# Patient Record
Sex: Female | Born: 1955 | Race: White | Hispanic: No | Marital: Married | Smoking: Never smoker
Health system: Southern US, Community
[De-identification: ages and names within clinical notes are randomized; demographics above are authoritative.]

## PROBLEM LIST (undated history)

## (undated) DIAGNOSIS — U071 COVID-19: Secondary | ICD-10-CM

## (undated) DIAGNOSIS — F32A Depression, unspecified: Secondary | ICD-10-CM

## (undated) DIAGNOSIS — I10 Essential (primary) hypertension: Secondary | ICD-10-CM

## (undated) DIAGNOSIS — I208 Other forms of angina pectoris: Secondary | ICD-10-CM

## (undated) DIAGNOSIS — G4730 Sleep apnea, unspecified: Secondary | ICD-10-CM

## (undated) DIAGNOSIS — M545 Low back pain, unspecified: Secondary | ICD-10-CM

## (undated) DIAGNOSIS — T7840XA Allergy, unspecified, initial encounter: Secondary | ICD-10-CM

## (undated) DIAGNOSIS — Z9989 Dependence on other enabling machines and devices: Secondary | ICD-10-CM

## (undated) DIAGNOSIS — I2510 Atherosclerotic heart disease of native coronary artery without angina pectoris: Secondary | ICD-10-CM

## (undated) DIAGNOSIS — R112 Nausea with vomiting, unspecified: Secondary | ICD-10-CM

## (undated) DIAGNOSIS — I2089 Other forms of angina pectoris: Secondary | ICD-10-CM

## (undated) DIAGNOSIS — G4733 Obstructive sleep apnea (adult) (pediatric): Secondary | ICD-10-CM

## (undated) DIAGNOSIS — F329 Major depressive disorder, single episode, unspecified: Secondary | ICD-10-CM

## (undated) DIAGNOSIS — E119 Type 2 diabetes mellitus without complications: Secondary | ICD-10-CM

## (undated) DIAGNOSIS — E785 Hyperlipidemia, unspecified: Secondary | ICD-10-CM

## (undated) DIAGNOSIS — B019 Varicella without complication: Secondary | ICD-10-CM

## (undated) DIAGNOSIS — Z9889 Other specified postprocedural states: Secondary | ICD-10-CM

## (undated) DIAGNOSIS — Z9289 Personal history of other medical treatment: Secondary | ICD-10-CM

## (undated) DIAGNOSIS — H269 Unspecified cataract: Secondary | ICD-10-CM

## (undated) HISTORY — DX: COVID-19: U07.1

## (undated) HISTORY — DX: Varicella without complication: B01.9

## (undated) HISTORY — DX: Dependence on other enabling machines and devices: Z99.89

## (undated) HISTORY — DX: Type 2 diabetes mellitus without complications: E11.9

## (undated) HISTORY — DX: Personal history of other medical treatment: Z92.89

## (undated) HISTORY — DX: Hyperlipidemia, unspecified: E78.5

## (undated) HISTORY — DX: Depression, unspecified: F32.A

## (undated) HISTORY — DX: Sleep apnea, unspecified: G47.30

## (undated) HISTORY — DX: Allergy, unspecified, initial encounter: T78.40XA

## (undated) HISTORY — DX: Obstructive sleep apnea (adult) (pediatric): G47.33

## (undated) HISTORY — DX: Unspecified cataract: H26.9

## (undated) HISTORY — PX: CAROTID STENT: SHX1301

## (undated) HISTORY — PX: BREAST EXCISIONAL BIOPSY: SUR124

## (undated) HISTORY — PX: NOSE SURGERY: SHX723

## (undated) HISTORY — DX: Low back pain, unspecified: M54.50

## (undated) HISTORY — PX: FOOT SURGERY: SHX648

## (undated) HISTORY — DX: Essential (primary) hypertension: I10

## (undated) HISTORY — PX: OTHER SURGICAL HISTORY: SHX169

## (undated) HISTORY — PX: SINOSCOPY: SHX187

## (undated) HISTORY — DX: Other forms of angina pectoris: I20.8

## (undated) HISTORY — DX: Other forms of angina pectoris: I20.89

---
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
## 1993-01-29 HISTORY — PX: BREAST EXCISIONAL BIOPSY: SUR124

## 1999-04-07 ENCOUNTER — Other Ambulatory Visit: Admission: RE | Admit: 1999-04-07 | Discharge: 1999-04-07 | Payer: Self-pay | Admitting: Obstetrics and Gynecology

## 1999-12-06 ENCOUNTER — Encounter: Admission: RE | Admit: 1999-12-06 | Discharge: 1999-12-06 | Payer: Self-pay | Admitting: Family Medicine

## 2000-05-28 ENCOUNTER — Encounter: Payer: Self-pay | Admitting: Obstetrics and Gynecology

## 2000-05-28 ENCOUNTER — Encounter: Admission: RE | Admit: 2000-05-28 | Discharge: 2000-05-28 | Payer: Self-pay | Admitting: Obstetrics and Gynecology

## 2000-06-25 ENCOUNTER — Other Ambulatory Visit: Admission: RE | Admit: 2000-06-25 | Discharge: 2000-06-25 | Payer: Self-pay | Admitting: Internal Medicine

## 2000-10-09 ENCOUNTER — Encounter: Admission: RE | Admit: 2000-10-09 | Discharge: 2000-10-09 | Payer: Self-pay | Admitting: Family Medicine

## 2001-07-02 ENCOUNTER — Encounter: Payer: Self-pay | Admitting: Obstetrics and Gynecology

## 2001-07-02 ENCOUNTER — Encounter: Admission: RE | Admit: 2001-07-02 | Discharge: 2001-07-02 | Payer: Self-pay | Admitting: Obstetrics and Gynecology

## 2001-07-14 ENCOUNTER — Other Ambulatory Visit: Admission: RE | Admit: 2001-07-14 | Discharge: 2001-07-14 | Payer: Self-pay | Admitting: Obstetrics and Gynecology

## 2002-07-13 ENCOUNTER — Encounter: Admission: RE | Admit: 2002-07-13 | Discharge: 2002-07-13 | Payer: Self-pay | Admitting: Obstetrics and Gynecology

## 2002-07-13 ENCOUNTER — Encounter: Payer: Self-pay | Admitting: Obstetrics and Gynecology

## 2002-07-27 ENCOUNTER — Other Ambulatory Visit: Admission: RE | Admit: 2002-07-27 | Discharge: 2002-07-27 | Payer: Self-pay | Admitting: Obstetrics and Gynecology

## 2002-09-10 ENCOUNTER — Ambulatory Visit (HOSPITAL_COMMUNITY): Admission: RE | Admit: 2002-09-10 | Discharge: 2002-09-10 | Payer: Self-pay | Admitting: Obstetrics and Gynecology

## 2003-08-10 ENCOUNTER — Other Ambulatory Visit: Admission: RE | Admit: 2003-08-10 | Discharge: 2003-08-10 | Payer: Self-pay | Admitting: Obstetrics and Gynecology

## 2004-05-05 ENCOUNTER — Ambulatory Visit: Payer: Self-pay | Admitting: Cardiology

## 2004-11-08 ENCOUNTER — Other Ambulatory Visit: Admission: RE | Admit: 2004-11-08 | Discharge: 2004-11-08 | Payer: Self-pay | Admitting: Obstetrics and Gynecology

## 2006-09-18 ENCOUNTER — Other Ambulatory Visit: Admission: RE | Admit: 2006-09-18 | Discharge: 2006-09-18 | Payer: Self-pay | Admitting: Family Medicine

## 2006-09-25 ENCOUNTER — Encounter: Admission: RE | Admit: 2006-09-25 | Discharge: 2006-09-25 | Payer: Self-pay | Admitting: Cardiology

## 2008-06-17 ENCOUNTER — Other Ambulatory Visit: Admission: RE | Admit: 2008-06-17 | Discharge: 2008-06-17 | Payer: Self-pay | Admitting: Family Medicine

## 2008-11-05 ENCOUNTER — Inpatient Hospital Stay (HOSPITAL_BASED_OUTPATIENT_CLINIC_OR_DEPARTMENT_OTHER): Admission: RE | Admit: 2008-11-05 | Discharge: 2008-11-05 | Payer: Self-pay | Admitting: Cardiology

## 2008-11-11 ENCOUNTER — Inpatient Hospital Stay (HOSPITAL_COMMUNITY): Admission: AD | Admit: 2008-11-11 | Discharge: 2008-11-12 | Payer: Self-pay | Admitting: Interventional Cardiology

## 2008-11-16 ENCOUNTER — Inpatient Hospital Stay (HOSPITAL_COMMUNITY): Admission: AD | Admit: 2008-11-16 | Discharge: 2008-11-17 | Payer: Self-pay | Admitting: Interventional Cardiology

## 2008-11-23 ENCOUNTER — Inpatient Hospital Stay (HOSPITAL_COMMUNITY): Admission: EM | Admit: 2008-11-23 | Discharge: 2008-11-24 | Payer: Self-pay | Admitting: Emergency Medicine

## 2008-11-25 ENCOUNTER — Encounter (HOSPITAL_COMMUNITY): Admission: RE | Admit: 2008-11-25 | Discharge: 2009-01-28 | Payer: Self-pay | Admitting: Cardiology

## 2009-01-29 ENCOUNTER — Encounter (HOSPITAL_COMMUNITY): Admission: RE | Admit: 2009-01-29 | Discharge: 2009-03-11 | Payer: Self-pay | Admitting: Cardiology

## 2009-05-27 ENCOUNTER — Ambulatory Visit (HOSPITAL_COMMUNITY): Admission: RE | Admit: 2009-05-27 | Discharge: 2009-05-27 | Payer: Self-pay | Admitting: Cardiology

## 2010-01-03 ENCOUNTER — Encounter: Admission: RE | Admit: 2010-01-03 | Discharge: 2010-01-03 | Payer: Self-pay | Admitting: Cardiology

## 2010-05-04 LAB — APTT: aPTT: 27 seconds (ref 24–37)

## 2010-05-04 LAB — BASIC METABOLIC PANEL
BUN: 11 mg/dL (ref 6–23)
BUN: 18 mg/dL (ref 6–23)
BUN: 21 mg/dL (ref 6–23)
BUN: 13 mg/dL (ref 6–23)
CO2: 25 mEq/L (ref 19–32)
CO2: 26 mEq/L (ref 19–32)
CO2: 27 mEq/L (ref 19–32)
CO2: 25 mEq/L (ref 19–32)
Calcium: 8.8 mg/dL (ref 8.4–10.5)
Calcium: 9 mg/dL (ref 8.4–10.5)
Calcium: 9.1 mg/dL (ref 8.4–10.5)
Calcium: 8.7 mg/dL (ref 8.4–10.5)
Chloride: 102 mEq/L (ref 96–112)
Chloride: 102 mEq/L (ref 96–112)
Chloride: 105 mEq/L (ref 96–112)
Chloride: 103 mEq/L (ref 96–112)
Creatinine, Ser: 0.74 mg/dL (ref 0.4–1.2)
Creatinine, Ser: 0.75 mg/dL (ref 0.4–1.2)
Creatinine, Ser: 0.76 mg/dL (ref 0.4–1.2)
Creatinine, Ser: 0.75 mg/dL (ref 0.4–1.2)
GFR calc Af Amer: >60 mL/min (ref >60)
GFR calc Af Amer: >60 mL/min (ref >60)
GFR calc Af Amer: >60 mL/min (ref >60)
GFR calc Af Amer: >60 mL/min (ref >60)
GFR calc non Af Amer: >60 mL/min (ref >60)
GFR calc non Af Amer: >60 mL/min (ref >60)
GFR calc non Af Amer: >60 mL/min (ref >60)
GFR calc non Af Amer: >60 mL/min (ref >60)
Glucose, Bld: 100 mg/dL — ABNORMAL HIGH (ref 70–99)
Glucose, Bld: 115 mg/dL — ABNORMAL HIGH (ref 70–99)
Glucose, Bld: 99 mg/dL (ref 70–99)
Glucose, Bld: 101 mg/dL — ABNORMAL HIGH (ref 70–99)
Potassium: 3.6 mEq/L (ref 3.5–5.1)
Potassium: 3.6 mEq/L (ref 3.5–5.1)
Potassium: 4 mEq/L (ref 3.5–5.1)
Potassium: 3.8 mEq/L (ref 3.5–5.1)
Sodium: 135 mEq/L (ref 135–145)
Sodium: 136 mEq/L (ref 135–145)
Sodium: 136 mEq/L (ref 135–145)
Sodium: 135 mEq/L (ref 135–145)

## 2010-05-04 LAB — CBC
HCT: 32.2 % — ABNORMAL LOW (ref 36.0–46.0)
HCT: 33.9 % — ABNORMAL LOW (ref 36.0–46.0)
HCT: 36.4 % (ref 36.0–46.0)
HCT: 33.7 % — ABNORMAL LOW (ref 36.0–46.0)
Hemoglobin: 11.1 g/dL — ABNORMAL LOW (ref 12.0–15.0)
Hemoglobin: 11.8 g/dL — ABNORMAL LOW (ref 12.0–15.0)
Hemoglobin: 12.5 g/dL (ref 12.0–15.0)
Hemoglobin: 11.5 g/dL — ABNORMAL LOW (ref 12.0–15.0)
MCHC: 34.3 g/dL (ref 30.0–36.0)
MCHC: 34.6 g/dL (ref 30.0–36.0)
MCHC: 34.8 g/dL (ref 30.0–36.0)
MCHC: 34.3 g/dL (ref 30.0–36.0)
MCV: 83.2 fL (ref 78.0–100.0)
MCV: 83.4 fL (ref 78.0–100.0)
MCV: 83.5 fL (ref 78.0–100.0)
MCV: 83.9 fL (ref 78.0–100.0)
Platelets: 354 10*3/uL (ref 150–400)
Platelets: 366 10*3/uL (ref 150–400)
Platelets: 393 10*3/uL (ref 150–400)
Platelets: 338 10*3/uL (ref 150–400)
RBC: 3.87 MIL/uL (ref 3.87–5.11)
RBC: 4.05 MIL/uL (ref 3.87–5.11)
RBC: 4.36 MIL/uL (ref 3.87–5.11)
RBC: 4.02 MIL/uL (ref 3.87–5.11)
RDW: 13.7 % (ref 11.5–15.5)
RDW: 13.7 % (ref 11.5–15.5)
RDW: 13.9 % (ref 11.5–15.5)
RDW: 13.9 % (ref 11.5–15.5)
WBC: 3.1 10*3/uL — ABNORMAL LOW (ref 4.0–10.5)
WBC: 3.6 10*3/uL — ABNORMAL LOW (ref 4.0–10.5)
WBC: 3.8 10*3/uL — ABNORMAL LOW (ref 4.0–10.5)
WBC: 4.9 10*3/uL (ref 4.0–10.5)

## 2010-05-04 LAB — DIFFERENTIAL
Basophils Absolute: 0 10*3/uL (ref 0.0–0.1)
Basophils Relative: 0 % (ref 0–1)
Eosinophils Absolute: 0 10*3/uL (ref 0.0–0.7)
Eosinophils Relative: 1 % (ref 0–5)
Lymphocytes Relative: 15 % (ref 12–46)
Lymphs Abs: 0.5 10*3/uL — ABNORMAL LOW (ref 0.7–4.0)
Monocytes Absolute: 0.3 10*3/uL (ref 0.1–1.0)
Monocytes Relative: 8 % (ref 3–12)
Neutro Abs: 2.7 10*3/uL (ref 1.7–7.7)
Neutrophils Relative %: 76 % (ref 43–77)

## 2010-05-04 LAB — POCT CARDIAC MARKERS
CKMB, poc: <1 ng/mL — ABNORMAL LOW (ref 1.0–8.0)
CKMB, poc: <1 ng/mL — ABNORMAL LOW (ref 1.0–8.0)
Myoglobin, poc: 57 ng/mL (ref 12–200)
Myoglobin, poc: 58.3 ng/mL (ref 12–200)
Troponin i, poc: <0.05 ng/mL (ref 0.00–0.09)
Troponin i, poc: <0.05 ng/mL (ref 0.00–0.09)

## 2010-05-04 LAB — PROTIME-INR
INR: 0.97 (ref 0.00–1.49)
Prothrombin Time: 12.8 seconds (ref 11.6–15.2)

## 2010-05-04 LAB — CK TOTAL AND CKMB (NOT AT ARMC)
CK, MB: 0.4 ng/mL (ref 0.3–4.0)
Relative Index: INVALID (ref 0.0–2.5)
Total CK: 31 U/L (ref 7–177)

## 2010-05-04 LAB — CARDIAC PANEL(CRET KIN+CKTOT+MB+TROPI)
CK, MB: 0.3 ng/mL (ref 0.3–4.0)
CK, MB: 0.4 ng/mL (ref 0.3–4.0)
Relative Index: INVALID (ref 0.0–2.5)
Relative Index: INVALID (ref 0.0–2.5)
Total CK: 27 U/L (ref 7–177)
Total CK: 31 U/L (ref 7–177)
Troponin I: 0.01 ng/mL (ref 0.00–0.06)
Troponin I: 0.01 ng/mL (ref 0.00–0.06)

## 2010-05-04 LAB — TROPONIN I: Troponin I: 0.01 ng/mL (ref 0.00–0.06)

## 2011-01-05 ENCOUNTER — Encounter (HOSPITAL_COMMUNITY): Payer: Self-pay

## 2011-01-15 ENCOUNTER — Other Ambulatory Visit: Payer: Self-pay | Admitting: Cardiology

## 2011-01-15 NOTE — H&P (Signed)
01/02/2011 Office Visit: Kristie A. Ferguson, Kristie Owens      Reason for Appointment  1. MS/Chest pressure & SOB on exertion  History of Present Illness  General:  Kristie Owens is a 55-year-old female followed by Dr Amine Adelson with coronary artery disease post cardiac catheterization 04/2009, showing patent stents. Nuclear stress test performed 1/11, demonstrating a small area of apical/distal inferolateral wall ischemia. She is status post DES to LAD in 2 areas, one at the bifurcation of a diagonal branch and also in the mid to distal region. Her diagonal branch was also ballooned . She has a DES to RCA 10/10. Her circumflex artery shows some ostial disease. She has noted that over the last week her chest pain has increased in intensity. She will have midchest tightness that radiates into her throat, and back, She feels it will be worse with exertion and she will be SOB. She also had epsode last night which woke her up, and became sweaty and nauseated, she took 2 NTG sl and ASA. The NTG did help. She did have palpitations and her niece who is an RN checked her HR 97, BP 126/68, the epsiode lasted 2 hrs. She had episode this am of chest pressure that would come and go. None at present. Her symptoms have been increasng over time and reminds her of when she had stents..  She states she was out of Plavix X 1 week, last week after problems with mail order. She since has received and restarted. No dizziness, syncope, swelling nor PND.  Current Medications  Fish Oil 1200 mg 3 daily  Biotin 1000 MCG Tablet 1 qd  Nasacort AQ 55 MCG/ACT Aerosol Solution 2 puffs in each nostril PRN  Flonase 50 MCG/ACT Suspension 2 puffs each nostril PRN  Zyrtec Allergy 10 MG Tablet 1 tablet daily prn  Zoloft 50 MG Tablet 1 tablet Once a day  Nitrostat 0.4 MG Tablet Sublingual 1 tablet under the tongue every 8 hrs  Isosorbide Mononitrate 60 MG Tablet Extended Release 24 Hour 1 tablet Once a day  Plavix 75MG Tablet TAKE 1 TABLET ONCE A  DAY Once a day  Aspirin 81 MG Tablet Delayed Release 1 tablet Once a day  Metoprolol Succinate 100 MG Tablet Extended Release 24 Hour 1 tablet Once a day  Scopolamine Base 1.5 MG Patch 72 Hour 1 patch behind ear every 3 days as needed  Ambien 10 MG Tablet 1/2 to 1 tab qhs as needed  Lisinopril-Hydrochlorothiazide 20/12.5MG Tablet TAKE 1 TABLET DAILY   Crestor 10MG Tablet TAKE 1 TABLET DAILY   Medication List reviewed and reconciled with the patient    Past Medical History  Depression, recurrent (resolved 06/17/2008)  CAD-drug-eluting stent RCA/LAD (mid right coronary artery-2.75 x 28 mm Xience), proximal LAD/mid DES/Xience-2.5 x 12, 3.0 x 28 mm  Surgical History  deviated septum repair   calcium deposit removed from heel   breast biopsy   ca   Family History  Father: deceased 47 yrs lung cancer, alcoholism   Mother: deceased 61 yrs emphysema, stomach ulcers, depression, AAA   Paternal Grand Father: deceased blood clot   Paternal Grand Mother: deceased heart disease and all her brothers had heart attacks   Maternal Grand Father: deceased cancer of the lung, commited suicide   Maternal Grand Mother: deceased TB   Brother 1: alive CAD with bypass at age 58   Brother2: alive CAD with bypasss at age 50   Brother 3: alive PAD, CAD and began in his 30's     Sister 1: alive high cholesterol, low HDL   Sister 2: alive hypertension   3 brother(s) , 2 sister(s) . 1 son(s) - healthy.   Social History  General:  History of smoking cigarettes: Never smoked. no Smoking. Alcohol: Rare. Caffeine: 1/2 cup coffee per week and no soft drinks. no Recreational drug use. Diet: low fat and portion control. , low salt. Exercise: HEPLER,Darice Vicario E 12/28/2010 05:04:01 PM > minimal exercise right now. Occupation: unemployed currently. Marital Status: Divorced. Religion: Protestant, Methodist. Seat belt use: yes.   Allergies  N.K.D.A.  Hospitalization/Major Diagnostic Procedure  childbirth with C/S 1983  Review of  Systems  as noted in HPI, no vomiting, diarrhea, black or bloody BMS, she does have chills, and feels hot this am. no neurological changes. appetite has been slightly decreased, no musculoskeletal pain with movement.  Vital Signs  Wt 175, Wt change -1.8 lb, Ht 62, BMI 32.00, Pulse sitting 84, BP sitting 118/70.  Examination  Cardiology, General: GENERAL APPEARANCE: pleasant, NAD. HEENT: unremarkable. CAROTID UPSTROKE: normal, no bruit. JVD: flat. HEART SOUNDS: regular, normal S1, S2, no S3 or S4. MURMUR: absent. LUNGS: no rales or wheezes. ABDOMEN: soft, non tender, positive bowel sounds, no masses felt. EXTREMITIES: no leg edema. PERIPHERAL PULSES: 2 plus bilateral.    Assessments  1. Chest pain - 786.50 (Primary)  2. Coronary atherosclerosis of native coronary artery - 414.01  3. Essential hypertension, benign - 401.1  Treatment  1. Chest pain  Diagnostic Imaging:EKG NSR no ectopy nor ischemic changes, Stenson,Melissa 01/02/2011 09:55:42 AM >  Plan of care with Dr Toini Failla will proceed with cardiac cath per radial approach at main cath lab. Risks and benefits of cardiac catheterization have been reviewed including risk of stroke, heart attack, death, bleeding, renal impariment and arterial damage. There was ample oppurtuny to answer questions. Alternatives were discussed. Patient understands and wishes to proceed. pt would prefer to have cath while off work in 1-2 weeks. Linda McVey will schedule cath and lab work with in 1 week of cath.    2. Coronary atherosclerosis of native coronary artery  Refill Plavix Tablet, 75MG, take 1 tablet once a day, Orally, Once a day, 90 days, 90, Refills 3 Continue Aspirin Tablet Delayed Release, 81 MG, 1 tablet, Orally, Once a day Refill Nitrostat Tablet Sublingual, 0.4 MG, 1 tablet under the tongue, Sublingual, as directed for chest pain, 30 days, 25, Refills 11 Continue Fish Oil, 1200 mg, 3, daily Continue Isosorbide Mononitrate Tablet Extended Release 24  Hour, 60 MG, 1 tablet, Orally, Once a day   3. Essential hypertension, benign  Continue Metoprolol Succinate Tablet Extended Release 24 Hour, 100 MG, 1 tablet, Orally, Once a day Continue Lisinopril-Hydrochlorothiazide Tablet, 20/12.5MG, TAKE 1 TABLET DAILY    Procedure Codes  93000 EKG I AND R  Follow Up  MS pending cath (Reason: Chest pain, CAD)          Electronically signed by Kristie Owens , Kristie Owens on 01/07/2011 at 08:09 PM EST  Electronically co-signed by Cabe Lashley on 01/11/2011 at 04:08 PM EDT  Sign off status: Completed     

## 2011-01-16 ENCOUNTER — Encounter (HOSPITAL_COMMUNITY)
Admission: RE | Disposition: A | Discharge status: Home / Self Care | Payer: Self-pay | Source: Ambulatory Visit | Attending: Cardiology

## 2011-01-16 ENCOUNTER — Ambulatory Visit (HOSPITAL_COMMUNITY)
Admission: RE | Admit: 2011-01-16 | Discharge: 2011-01-16 | Disposition: A | Discharge status: Home / Self Care | Payer: BC Managed Care – PPO | Source: Ambulatory Visit | Attending: Cardiology | Admitting: Cardiology

## 2011-01-16 ENCOUNTER — Encounter (HOSPITAL_COMMUNITY): Payer: Self-pay | Admitting: Cardiology

## 2011-01-16 DIAGNOSIS — Z9861 Coronary angioplasty status: Secondary | ICD-10-CM | POA: Insufficient documentation

## 2011-01-16 DIAGNOSIS — I200 Unstable angina: Secondary | ICD-10-CM

## 2011-01-16 DIAGNOSIS — I2510 Atherosclerotic heart disease of native coronary artery without angina pectoris: Secondary | ICD-10-CM | POA: Diagnosis present

## 2011-01-16 HISTORY — DX: Atherosclerotic heart disease of native coronary artery without angina pectoris: I25.10

## 2011-01-16 HISTORY — DX: Unstable angina: I20.0

## 2011-01-16 HISTORY — PX: LEFT HEART CATHETERIZATION WITH CORONARY ANGIOGRAM: SHX5451

## 2011-01-16 SURGERY — LEFT HEART CATHETERIZATION WITH CORONARY ANGIOGRAM
Anesthesia: LOCAL

## 2011-01-16 MED ORDER — LIDOCAINE HCL (PF) 1 % IJ SOLN
INTRAMUSCULAR | Status: AC
  Filled 2011-01-16: qty 30

## 2011-01-16 MED ORDER — SODIUM CHLORIDE 0.9 % IV SOLN: INTRAVENOUS | Status: DC

## 2011-01-16 MED ORDER — ASPIRIN 81 MG PO CHEW
324.0000 mg | CHEWABLE_TABLET | ORAL | Status: AC
  Administered 2011-01-16: 324 mg via ORAL
  Filled 2011-01-16: qty 1

## 2011-01-16 MED ORDER — HEPARIN SODIUM (PORCINE) 1000 UNIT/ML IJ SOLN
INTRAMUSCULAR | Status: AC
  Filled 2011-01-16: qty 1

## 2011-01-16 MED ORDER — SODIUM CHLORIDE 0.9 % IJ SOLN: 3.0000 mL | INTRAMUSCULAR | Status: DC | PRN

## 2011-01-16 MED ORDER — MIDAZOLAM HCL 2 MG/2ML IJ SOLN
INTRAMUSCULAR | Status: AC
  Filled 2011-01-16: qty 2

## 2011-01-16 MED ORDER — FENTANYL CITRATE 0.05 MG/ML IJ SOLN
INTRAMUSCULAR | Status: AC
  Filled 2011-01-16: qty 2

## 2011-01-16 MED ORDER — SODIUM CHLORIDE 0.9 % IJ SOLN: 3.0000 mL | Freq: Two times a day (BID) | INTRAMUSCULAR | Status: DC

## 2011-01-16 MED ORDER — DIAZEPAM 5 MG PO TABS
5.0000 mg | ORAL_TABLET | ORAL | Status: AC
Start: 2011-01-16 — End: 2011-01-16
  Administered 2011-01-16: 5 mg via ORAL
  Filled 2011-01-16: qty 1

## 2011-01-16 MED ORDER — SODIUM CHLORIDE 0.9 % IV SOLN: 250.0000 mL | INTRAVENOUS | Status: DC | PRN

## 2011-01-16 MED ORDER — SODIUM CHLORIDE 0.9 % IV SOLN
INTRAVENOUS | Status: DC
  Administered 2011-01-16: 08:00:00 via INTRAVENOUS

## 2011-01-16 MED ORDER — HEPARIN (PORCINE) IN NACL 2-0.9 UNIT/ML-% IJ SOLN
INTRAMUSCULAR | Status: AC
  Filled 2011-01-16: qty 2000

## 2011-01-16 MED ORDER — VERAPAMIL HCL 2.5 MG/ML IV SOLN
INTRAVENOUS | Status: AC
  Filled 2011-01-16: qty 2

## 2011-01-16 MED ORDER — NITROGLYCERIN 0.2 MG/ML ON CALL CATH LAB
INTRAVENOUS | Status: AC
  Filled 2011-01-16: qty 1

## 2011-01-16 NOTE — Op Note (Signed)
PROCEDURE:  Left heart catheterization with selective coronary angiography, left ventriculogram via the radial artery approach.  INDICATIONS:  55 year old female with coronary artery disease status post DES to LAD, 1 at the bifurcation of a diagonal branch and also in the mid to distal region with prior diagonal PTCA and a DES in the RCA placed in October of 2010 here for unstable anginal picture with mid chest tightness radiating to her throat and back worse with exertion associated with shortness of breath. She was also sweaty and nauseated at one point. Nitroglycerin did help. Last nuclear stress test was in January of 2011 demonstrating a small area of apical/distal inferolateral wall ischemia.  The risks, benefits, and details of the procedure were explained to the patient, including possibilities of stroke, heart attack, death, renal impairment, arterial damage, bleeding.  The patient verbalized understanding and wanted to proceed.  Informed written consent was obtained.  PROCEDURE TECHNIQUE:  Allen's test was performed pre-and post procedure and was normal. The right radial artery site was prepped and draped in a sterile fashion. One percent lidocaine was used for local anesthesia. Using the modified Seldinger technique a 5 French hydrophilic sheath was inserted into the radial artery without difficulty. 3 mg of verapamil was administered via the sheath. A Judkins right #4 catheter with the guidance of a Versicore wire was placed in the right coronary cusp and selectively cannulated the right coronary artery. After traversing the aortic arch, 4000 units of heparin IV was administered. A Judkins left #3.5 catheter was used to selectively cannulate the left main artery. Multiple views with hand injection of Omnipaque were obtained. Catheter a pigtail catheter was used to cross into the left ventricle, hemodynamics were obtained, and a left ventriculogram was performed in the RAO position with power  injection. Following the procedure, sheath was removed, patient was hemodynamically stable, hemostasis was maintained with a Terumo T band.   CONTRAST:  Total of 60 ml.  FLOUROSCOPY TIME: 2.5 min.  COMPLICATIONS:  None.    HEMODYNAMICS:  Aortic pressure was 101/54; LV pressure was 101; LVEDP 10.  There was no gradient between the left ventricle and aorta.    ANGIOGRAPHIC DATA:    Left main: No angiographically significant disease. Branches into the LAD and circumflex.  Left anterior descending (LAD): Previously placed stents are widely patent there is approximately 40% ostial diagonal disease at the proximal stent. This vessel was previously PTCA. Second diagonal branch distally minor luminal irregularity mid to distal LAD stent widely patent.  Circumflex artery (CIRC): The overall circumflex system is small to moderate in caliber size when compared to LAD. There is mild ostial disease which is unchanged when compared to prior cardiac catheterization. IV nitroglycerin was utilized. There change.  Right coronary artery (RCA): Previously placed mid RCA stent is widely patent. Minor luminal irregularities throughout vessel.  LEFT VENTRICULOGRAM:  Left ventricular angiogram was done in the 30 RAO projection and revealed normal left ventricular wall motion and systolic function with an estimated ejection fraction of 65 %.  LVEDP was 10 mmHg.  IMPRESSIONS:  Previously placed mid LAD stent, distal LAD stent, mid RCA stent are widely patent. Minor luminal irregularity at ostial diagonal site where previous balloon angioplasty took place. Small caliber total circumflex system. There is no place to attempt PCI in this system without jeopardizing the LAD. Overall flow appears normal and the circumflex.  Normal left ventricular systolic function.  LVEDP 10 mmHg.  Ejection fraction 65 %.  RECOMMENDATION:  Continued medical management.  We will see her back in clinic for followup. We may wish to try  Ranexa. Continue isosorbide 60. Metoprolol succinate 100. Aggressive risk factor modification.

## 2011-01-16 NOTE — Interval H&P Note (Signed)
History and Physical Interval Note:  01/16/2011 8:59 AM  Kristie Owens  has presented today for surgery, with the diagnosis of chest pain  The various methods of treatment have been discussed with the patient and family. After consideration of risks, benefits and other options for treatment, the patient has consented to  Procedure(s): LEFT HEART CATHETERIZATION WITH CORONARY ANGIOGRAM as a surgical intervention .  The patients' history has been reviewed, patient examined, no change in status, stable for surgery.  I have reviewed the patients' chart and labs.  Questions were answered to the patient's satisfaction.     Kristie Owens

## 2011-01-16 NOTE — Progress Notes (Signed)
POSITIVE REVERSE ALLENS TEST.

## 2011-01-16 NOTE — H&P (View-Only) (Signed)
01/02/2011 Office Visit: Kristie Beecham A. Emelda Fear, NP      Reason for Appointment  1. MS/Chest pressure & SOB on exertion  History of Present Illness  General:  Mrs Kristie Owens is a 55 year old female followed by Dr Anne Fu with coronary artery disease post cardiac catheterization 04/2009, showing patent stents. Nuclear stress test performed 1/11, demonstrating a small area of apical/distal inferolateral wall ischemia. She is status post DES to LAD in 2 areas, one at the bifurcation of a diagonal branch and also in the mid to distal region. Her diagonal branch was also ballooned . She has a DES to RCA 10/10. Her circumflex artery shows some ostial disease. She has noted that over the last week her chest pain has increased in intensity. She will have midchest tightness that radiates into her throat, and back, She feels it will be worse with exertion and she will be SOB. She also had epsode last night which woke her up, and became sweaty and nauseated, she took 2 NTG sl and ASA. The NTG did help. She did have palpitations and her niece who is an Char[employer] fundraiser checked her HR 97, BP 126/68, the epsiode lasted 2 hrs. She had episode this am of chest pressure that would come and go. None at present. Her symptoms have been increasng over time and reminds her of when she had stents..  She states she was out of Plavix X 1 week, last week after problems with mail order. She since has received and restarted. No dizziness, syncope, swelling nor PND.  Current Medications  Fish Oil 1200 mg 3 daily  Biotin 1000 MCG Tablet 1 qd  Nasacort AQ 55 MCG/ACT Aerosol Solution 2 puffs in each nostril PRN  Flonase 50 MCG/ACT Suspension 2 puffs each nostril PRN  Zyrtec Allergy 10 MG Tablet 1 tablet daily prn  Zoloft 50 MG Tablet 1 tablet Once a day  Nitrostat 0.4 MG Tablet Sublingual 1 tablet under the tongue every 8 hrs  Isosorbide Mononitrate 60 MG Tablet Extended Release 24 Hour 1 tablet Once a day  Plavix 75MG  Tablet TAKE 1 TABLET ONCE A  DAY Once a day  Aspirin 81 MG Tablet Delayed Release 1 tablet Once a day  Metoprolol Succinate 100 MG Tablet Extended Release 24 Hour 1 tablet Once a day  Scopolamine Base 1.5 MG Patch 72 Hour 1 patch behind ear every 3 days as needed  Ambien 10 MG Tablet 1/2 to 1 tab qhs as needed  Lisinopril-Hydrochlorothiazide 20/12.5MG  Tablet TAKE 1 TABLET DAILY   Crestor 10MG  Tablet TAKE 1 TABLET DAILY   Medication List reviewed and reconciled with the patient    Past Medical History  Depression, recurrent (resolved 06/17/2008)  CAD-drug-eluting stent RCA/LAD (mid right coronary artery-2.75 x 28 mm Xience), proximal LAD/mid DES/Xience-2.5 x 12, 3.0 x 28 mm  Surgical History  deviated septum repair   calcium deposit removed from heel   breast biopsy   ca   Family History  Father: deceased 97 yrs lung cancer, alcoholism   Mother: deceased 65 yrs emphysema, stomach ulcers, depression, AAA   Paternal Grand Father: deceased blood clot   Paternal Grand Mother: deceased heart disease and all her brothers had heart attacks   Maternal Grand Father: deceased cancer of the lung, commited suicide   Maternal Grand Mother: deceased TB   Brother 1: alive CAD with bypass at age 40   Brother2: alive CAD with bypasss at age 17   Brother 3: alive PAD, CAD and began in his 34's  Sister 1: alive high cholesterol, low HDL   Sister 2: alive hypertension   3 brother(s) , 2 sister(s) . 1 son(s) - healthy.   Social History  General:  History of smoking cigarettes: Never smoked. no Smoking. Alcohol: Rare. Caffeine: 1/2 cup coffee per week and no soft drinks. no Recreational drug use. Diet: low fat and portion control. , low salt. Exercise: HEPLER,MARK E 12/28/2010 05:04:01 PM > minimal exercise right now. Occupation: unemployed currently. Marital Status: Divorced. Religion: Protestant, Methodist. Seat belt use: yes.   Allergies  N.K.D.A.  Hospitalization/Major Diagnostic Procedure  childbirth with C/S 1983  Review of  Systems  as noted in HPI, no vomiting, diarrhea, black or bloody BMS, she does have chills, and feels hot this am. no neurological changes. appetite has been slightly decreased, no musculoskeletal pain with movement.  Vital Signs  Wt 175, Wt change -1.8 lb, Ht 62, BMI 32.00, Pulse sitting 84, BP sitting 118/70.  Examination  Cardiology, General: GENERAL APPEARANCE: pleasant, NAD. HEENT: unremarkable. CAROTID UPSTROKE: normal, no bruit. JVD: flat. HEART SOUNDS: regular, normal S1, S2, no S3 or S4. MURMUR: absent. LUNGS: no rales or wheezes. ABDOMEN: soft, non tender, positive bowel sounds, no masses felt. EXTREMITIES: no leg edema. PERIPHERAL PULSES: 2 plus bilateral.    Assessments  1. Chest pain - 786.50 (Primary)  2. Coronary atherosclerosis of native coronary artery - 414.01  3. Essential hypertension, benign - 401.1  Treatment  1. Chest pain  Diagnostic Imaging:EKG NSR no ectopy nor ischemic changes, Stenson,Melissa 01/02/2011 09:55:42 AM >  Plan of care with Dr Anne Fu will proceed with cardiac cath per radial approach at main cath lab. Risks and benefits of cardiac catheterization have been reviewed including risk of stroke, heart attack, death, bleeding, renal impariment and arterial damage. There was ample oppurtuny to answer questions. Alternatives were discussed. Patient understands and wishes to proceed. pt would prefer to have cath while off work in 1-2 weeks. Lorenda Cahill will schedule cath and lab work with in 1 week of cath.    2. Coronary atherosclerosis of native coronary artery  Refill Plavix Tablet, 75MG , take 1 tablet once a day, Orally, Once a day, 90 days, 90, Refills 3 Continue Aspirin Tablet Delayed Release, 81 MG, 1 tablet, Orally, Once a day Refill Nitrostat Tablet Sublingual, 0.4 MG, 1 tablet under the tongue, Sublingual, as directed for chest pain, 30 days, 25, Refills 11 Continue Fish Oil, 1200 mg, 3, daily Continue Isosorbide Mononitrate Tablet Extended Release 24  Hour, 60 MG, 1 tablet, Orally, Once a day   3. Essential hypertension, benign  Continue Metoprolol Succinate Tablet Extended Release 24 Hour, 100 MG, 1 tablet, Orally, Once a day Continue Lisinopril-Hydrochlorothiazide Tablet, 20/12.5MG , TAKE 1 TABLET DAILY    Procedure Codes  16109 EKG I AND R  Follow Up  MS pending cath (Reason: Chest pain, CAD)          Electronically signed by Kristie Owens , NP on 01/07/2011 at 08:09 PM EST  Electronically co-signed by Kristie Owens on 01/11/2011 at 04:08 PM EDT  Sign off status: Completed

## 2011-02-01 ENCOUNTER — Encounter (HOSPITAL_COMMUNITY): Payer: Self-pay | Admitting: Adult Health

## 2011-02-01 ENCOUNTER — Emergency Department (HOSPITAL_COMMUNITY)
Admission: EM | Admit: 2011-02-01 | Discharge: 2011-02-01 | Disposition: A | Discharge status: Home / Self Care | Payer: 59 | Attending: Emergency Medicine | Admitting: Emergency Medicine

## 2011-02-01 ENCOUNTER — Other Ambulatory Visit: Payer: Self-pay

## 2011-02-01 DIAGNOSIS — R011 Cardiac murmur, unspecified: Secondary | ICD-10-CM | POA: Insufficient documentation

## 2011-02-01 DIAGNOSIS — R55 Syncope and collapse: Secondary | ICD-10-CM | POA: Insufficient documentation

## 2011-02-01 DIAGNOSIS — Z7982 Long term (current) use of aspirin: Secondary | ICD-10-CM | POA: Insufficient documentation

## 2011-02-01 DIAGNOSIS — R42 Dizziness and giddiness: Secondary | ICD-10-CM | POA: Insufficient documentation

## 2011-02-01 DIAGNOSIS — R61 Generalized hyperhidrosis: Secondary | ICD-10-CM | POA: Insufficient documentation

## 2011-02-01 DIAGNOSIS — R110 Nausea: Secondary | ICD-10-CM | POA: Insufficient documentation

## 2011-02-01 DIAGNOSIS — I2510 Atherosclerotic heart disease of native coronary artery without angina pectoris: Secondary | ICD-10-CM | POA: Insufficient documentation

## 2011-02-01 DIAGNOSIS — R079 Chest pain, unspecified: Secondary | ICD-10-CM | POA: Insufficient documentation

## 2011-02-01 DIAGNOSIS — Z79899 Other long term (current) drug therapy: Secondary | ICD-10-CM | POA: Insufficient documentation

## 2011-02-01 LAB — CBC
HCT: 38.2 % (ref 36.0–46.0)
Hemoglobin: 12.7 g/dL (ref 12.0–15.0)
MCH: 27.4 pg (ref 26.0–34.0)
MCHC: 33.2 g/dL (ref 30.0–36.0)
MCV: 82.3 fL (ref 78.0–100.0)
Platelets: 339 10*3/uL (ref 150–400)
RBC: 4.64 MIL/uL (ref 3.87–5.11)
RDW: 14.3 % (ref 11.5–15.5)
WBC: 4.8 10*3/uL (ref 4.0–10.5)

## 2011-02-01 LAB — DIFFERENTIAL
Basophils Absolute: 0 10*3/uL (ref 0.0–0.1)
Basophils Relative: 1 % (ref 0–1)
Eosinophils Absolute: 0.2 10*3/uL (ref 0.0–0.7)
Eosinophils Relative: 4 % (ref 0–5)
Lymphocytes Relative: 60 % — ABNORMAL HIGH (ref 12–46)
Lymphs Abs: 2.9 10*3/uL (ref 0.7–4.0)
Monocytes Absolute: 0.5 10*3/uL (ref 0.1–1.0)
Monocytes Relative: 11 % (ref 3–12)
Neutro Abs: 1.2 10*3/uL — ABNORMAL LOW (ref 1.7–7.7)
Neutrophils Relative %: 24 % — ABNORMAL LOW (ref 43–77)

## 2011-02-01 LAB — BASIC METABOLIC PANEL
BUN: 22 mg/dL (ref 6–23)
CO2: 22 mEq/L (ref 19–32)
Calcium: 9.9 mg/dL (ref 8.4–10.5)
Chloride: 102 mEq/L (ref 96–112)
Creatinine, Ser: 0.72 mg/dL (ref 0.50–1.10)
GFR calc Af Amer: >90 mL/min (ref >90)
GFR calc non Af Amer: >90 mL/min (ref >90)
Glucose, Bld: 92 mg/dL (ref 70–99)
Potassium: 3.2 mEq/L — ABNORMAL LOW (ref 3.5–5.1)
Sodium: 138 mEq/L (ref 135–145)

## 2011-02-01 LAB — TROPONIN I
Troponin I: <0.3 ng/mL (ref <0.30)
Troponin I: <0.3 ng/mL (ref <0.30)

## 2011-02-01 MED ORDER — NITROGLYCERIN 0.4 MG SL SUBL: 0.4000 mg | SUBLINGUAL_TABLET | SUBLINGUAL | Status: DC | PRN

## 2011-02-01 MED ORDER — POTASSIUM CHLORIDE 20 MEQ/15ML (10%) PO LIQD
40.0000 meq | Freq: Once | ORAL | Status: AC
  Administered 2011-02-01: 40 meq via ORAL
  Filled 2011-02-01: qty 30

## 2011-02-01 MED ORDER — SODIUM CHLORIDE 0.9 % IV SOLN
INTRAVENOUS | Status: DC
  Administered 2011-02-01: 21:00:00 via INTRAVENOUS

## 2011-02-01 NOTE — ED Notes (Signed)
While visiting a pt in the hospital pt became pale, diaphoretic and dizzy. CBG 89, BP 117/62, EKG completed. IV in right hand. Hx of 3 cardiac stents.

## 2011-02-01 NOTE — ED Notes (Signed)
Pt took one baby ASA today.

## 2011-02-01 NOTE — ED Provider Notes (Signed)
History     CSN: [id number]  Arrival date & time 02/01/11  1820   First MD Initiated Contact with Patient 02/01/11 1853      Chief Complaint  Patient presents with  . Loss of Consciousness    (Consider location/radiation/quality/duration/timing/severity/associated sxs/prior treatment) HPI 56 year old female was in the emergency department with a family member who had suffered a wrist fracture, when she started feeling faint and had a syncopal episode. This occurred while the fracture was being splinted. There was associated nausea and diaphoresis. She feels much better now, but she noted some heaviness in her chest when she regained consciousness. The chest heaviness has persisted although it has lessened. It was 5/10 at its worst, and is 2/10 currently. There is no associated dyspnea. She did have a recent cardiac catheterization and she was told that her 2 stents were fine. She has taken her aspirin and her Plavix today. Past Medical History  Diagnosis Date  . CAD (coronary artery disease), native coronary artery     DES to LAD in 2 areas, one at the bifurcation of a diagonal branch and also in the mid to distal region. Her diagonal branch was also ballooned . She has a DES to RCA 10/10. Her circumflex artery mild ostial disease.    History reviewed. No pertinent past surgical history.  History reviewed. No pertinent family history.  History  Substance Use Topics  . Smoking status: Not on file  . Smokeless tobacco: Not on file  . Alcohol Use: Not on file    OB History    Grav Para Term Preterm Abortions TAB SAB Ect Mult Living                  Review of Systems  Allergies  Review of patient's allergies indicates no known allergies.  Home Medications   Current Outpatient Rx  Name Route Sig Dispense Refill  . ASPIRIN EC 81 MG PO TBEC Oral Take 81 mg by mouth daily.      [name] BIOTIN PO Oral Take 1 tablet by mouth daily.      [name] CETIRIZINE HCL 10 MG PO TABS Oral Take 10 mg  by mouth daily.      [name] CLOPIDOGREL BISULFATE 75 MG PO TABS Oral Take 75 mg by mouth daily.      . OMEGA-3 FATTY ACIDS 1000 MG PO CAPS Oral Take 1 g by mouth 3 (three) times daily.      . ISOSORBIDE MONONITRATE ER 60 MG PO TB24 Oral Take 60 mg by mouth daily.      [name] LISINOPRIL-HYDROCHLOROTHIAZIDE 20-12.5 MG PO TABS Oral Take 1 tablet by mouth daily.      [name] METOPROLOL SUCCINATE ER 100 MG PO TB24 Oral Take 100 mg by mouth daily.      [name] ROSUVASTATIN CALCIUM 10 MG PO TABS Oral Take 10 mg by mouth daily.      . SERTRALINE HCL 50 MG PO TABS Oral Take 50 mg by mouth daily.      [name] ZOLPIDEM TARTRATE 5 MG PO TABS Oral Take 5 mg by mouth at bedtime.        BP 130/79  Pulse 63  Temp(Src) 97.8 F (36.6 C) (Oral)  Resp 25  SpO2 100%  Physical Exam 56 year old female is resting comfortably and in no acute distress. Vital signs are normal. Oxygen saturation is 100% which is normal. Head is normocephalic and atraumatic. PERRLA, EOMI. Oropharynx is clear. Neck is supple without adenopathy, JVD, or bruit.  Back is nontender. Lungs are clear without rales, wheezes, rhonchi. Heart has regular rate and rhythm with a 2/6 soft systolic ejection murmur heard at the upper left sternal border. There is no chest wall tenderness. Abdomen is soft, flat, nontender without masses or hepatosplenomegaly. Extremities have no cyanosis or edema, full range of motion present. Skin is warm and moist without rash. Neurologic: Mental status is normal, cranial nerves are intact, there no focal motor or sensory deficits. Psychiatric: No abnormalities of mood or affect. ED Course  Procedures (including critical care time)   Labs Reviewed  CBC  DIFFERENTIAL  BASIC METABOLIC PANEL  TROPONIN I  TROPONIN I   No results found.  Results for orders placed during the hospital encounter of 02/01/11  CBC      Component Value Range   WBC 4.8  4.0 - 10.5 (K/uL)   RBC 4.64  3.87 - 5.11 (MIL/uL)   Hemoglobin 12.7  12.0 - 15.0 (g/dL)    HCT 04.5  40.9 - 81.1 (%)   MCV 82.3  78.0 - 100.0 (fL)   MCH 27.4  26.0 - 34.0 (pg)   MCHC 33.2  30.0 - 36.0 (g/dL)   RDW 91.4  78.2 - 95.6 (%)   Platelets 339  150 - 400 (K/uL)  DIFFERENTIAL      Component Value Range   Neutrophils Relative 24 (*) 43 - 77 (%)   Neutro Abs 1.2 (*) 1.7 - 7.7 (K/uL)   Lymphocytes Relative 60 (*) 12 - 46 (%)   Lymphs Abs 2.9  0.7 - 4.0 (K/uL)   Monocytes Relative 11  3 - 12 (%)   Monocytes Absolute 0.5  0.1 - 1.0 (K/uL)   Eosinophils Relative 4  0 - 5 (%)   Eosinophils Absolute 0.2  0.0 - 0.7 (K/uL)   Basophils Relative 1  0 - 1 (%)   Basophils Absolute 0.0  0.0 - 0.1 (K/uL)  BASIC METABOLIC PANEL      Component Value Range   Sodium 138  135 - 145 (mEq/L)   Potassium 3.2 (*) 3.5 - 5.1 (mEq/L)   Chloride 102  96 - 112 (mEq/L)   CO2 22  19 - 32 (mEq/L)   Glucose, Bld 92  70 - 99 (mg/dL)   BUN 22  6 - 23 (mg/dL)   Creatinine, Ser 2.13  0.50 - 1.10 (mg/dL)   Calcium 9.9  8.4 - 08.6 (mg/dL)   GFR calc non Af Amer >90  >90 (mL/min)   GFR calc Af Amer >90  >90 (mL/min)  TROPONIN I      Component Value Range   Troponin I <0.30  <0.30 (ng/mL)  TROPONIN I      Component Value Range   Troponin I <0.30  <0.30 (ng/mL)   No results found.   No diagnosis found.  ECG shows normal sinus rhythm with a rate of 62, no ectopy. Normal axis. Normal P wave. Normal QRS. Normal intervals. Normal ST and T waves. Impression: normal ECG. When compared with ECG of 11/23/2008, no significant changes are seen  The patient had spontaneous resolution of her chest heaviness without receiving any nitroglycerin. With 2 negative cardiac markers and a recent cardiac catheterization showing no occlusion greater than 40%, either she is safe for discharge at this point. Clinically, her syncope was clearly vasovagal.  MDM  Clinically, episode of vasovagal syncope. However, chest heaviness following syncopal episode is concerning given her history of 2 stents being placed. He  reviewed her hospital  record including a cardiac catheterization on 01/16/2011, and her stents in the LAD had a 40% ostial stenosis, and no other significant coronary artery disease. Stent in the right coronary artery was widely patent. Given this anatomy, it is very unlikely that she actually had a significant cardiac event, but she will need to be kept in the emergency department to cycle cardiac enzymes.        Dione Booze, MD 02/01/11 2337

## 2012-01-14 ENCOUNTER — Ambulatory Visit: Payer: 59 | Admitting: Physical Therapy

## 2012-01-15 ENCOUNTER — Ambulatory Visit: Payer: 59 | Attending: Podiatry | Admitting: Physical Therapy

## 2012-01-15 DIAGNOSIS — R5381 Other malaise: Secondary | ICD-10-CM | POA: Insufficient documentation

## 2012-01-15 DIAGNOSIS — IMO0001 Reserved for inherently not codable concepts without codable children: Secondary | ICD-10-CM | POA: Insufficient documentation

## 2012-01-15 DIAGNOSIS — M25676 Stiffness of unspecified foot, not elsewhere classified: Secondary | ICD-10-CM | POA: Insufficient documentation

## 2012-01-15 DIAGNOSIS — M25673 Stiffness of unspecified ankle, not elsewhere classified: Secondary | ICD-10-CM | POA: Insufficient documentation

## 2012-01-15 DIAGNOSIS — M25579 Pain in unspecified ankle and joints of unspecified foot: Secondary | ICD-10-CM | POA: Insufficient documentation

## 2012-01-17 ENCOUNTER — Ambulatory Visit: Payer: 59 | Admitting: Physical Therapy

## 2012-01-21 ENCOUNTER — Encounter: Payer: 59 | Admitting: Physical Therapy

## 2012-01-24 ENCOUNTER — Ambulatory Visit: Payer: 59 | Admitting: Physical Therapy

## 2012-01-28 ENCOUNTER — Encounter: Payer: 59 | Admitting: Physical Therapy

## 2012-10-07 ENCOUNTER — Other Ambulatory Visit: Payer: Self-pay | Admitting: Podiatry

## 2012-10-07 DIAGNOSIS — R52 Pain, unspecified: Secondary | ICD-10-CM

## 2012-10-13 ENCOUNTER — Ambulatory Visit
Admission: RE | Admit: 2012-10-13 | Discharge: 2012-10-13 | Disposition: A | Discharge status: Home / Self Care | Payer: 59 | Source: Ambulatory Visit | Attending: Podiatry | Admitting: Podiatry

## 2012-10-13 DIAGNOSIS — R52 Pain, unspecified: Secondary | ICD-10-CM

## 2012-10-31 ENCOUNTER — Other Ambulatory Visit: Payer: Self-pay | Admitting: Cardiology

## 2012-11-11 ENCOUNTER — Encounter: Payer: Self-pay | Admitting: Podiatry

## 2012-11-11 ENCOUNTER — Ambulatory Visit (INDEPENDENT_AMBULATORY_CARE_PROVIDER_SITE_OTHER): Payer: 59 | Admitting: Podiatry

## 2012-11-11 VITALS — BP 105/53 | HR 79 | Resp 16 | Ht 64.0 in | Wt 178.0 lb

## 2012-11-11 DIAGNOSIS — M7672 Peroneal tendinitis, left leg: Secondary | ICD-10-CM

## 2012-11-11 DIAGNOSIS — M7660 Achilles tendinitis, unspecified leg: Secondary | ICD-10-CM | POA: Insufficient documentation

## 2012-11-11 DIAGNOSIS — M7750 Other enthesopathy of unspecified foot: Secondary | ICD-10-CM

## 2012-11-11 HISTORY — DX: Peroneal tendinitis, left leg: M76.72

## 2012-11-11 HISTORY — DX: Achilles tendinitis, unspecified leg: M76.60

## 2012-11-11 MED ORDER — METHYLPREDNISOLONE (PAK) 4 MG PO TABS: ORAL_TABLET | ORAL | Status: DC

## 2012-11-11 NOTE — Progress Notes (Signed)
Kristie Owens presents today and says the steroid really helped out with both feet.  But it has slowly started coming back as she refers to the achilles tendinitis of the right foot and perineal tendinitis of the left foot.  She is here today to discuss options and MRI report.  O:  VSS A&OX3. Pulses palp and no calf pain b/l.  She still has pain on palp of the right heel and the lateral left foot.  MRI reveals intratendinous tear of the Achilles rt.  The left demonstrates a split tear of the peroneus brevis tendon.  Mild edema and tenderness over the areas in question.  A:  Achilles tendinitis with tear rt. Peroneus brevis tendon tear left foot.  P:  Discussed etiology, pathology conservative versus surgical therapy.  Currently Kristie Owens does not wish to have surgical correction.  We discussed putting her back on the Steroidal and non steroidal therapy.  She will continue to use the CAM walker during the day and night.  We will follow up with her in a few weeks. Rxs were written today and she will follow up in the near future.

## 2012-11-11 NOTE — Patient Instructions (Signed)
ICE. Wear splints and boots when possible. Start Medrol dose pack when it arrives followed by Ibuprofen. Follow up with me in 6 weeks.

## 2012-12-08 ENCOUNTER — Encounter: Payer: Self-pay | Admitting: Interventional Cardiology

## 2012-12-23 ENCOUNTER — Ambulatory Visit: Payer: 59 | Admitting: Podiatry

## 2013-01-29 ENCOUNTER — Other Ambulatory Visit: Payer: Self-pay | Admitting: Cardiology

## 2013-03-04 ENCOUNTER — Ambulatory Visit: Payer: 59 | Admitting: Cardiology

## 2013-03-09 ENCOUNTER — Ambulatory Visit: Payer: 59 | Admitting: Cardiology

## 2013-03-23 ENCOUNTER — Ambulatory Visit: Payer: Self-pay | Admitting: Cardiology

## 2013-04-10 ENCOUNTER — Other Ambulatory Visit: Payer: Self-pay | Admitting: Cardiology

## 2013-04-20 ENCOUNTER — Ambulatory Visit: Payer: Self-pay | Admitting: Cardiology

## 2013-05-13 ENCOUNTER — Ambulatory Visit (INDEPENDENT_AMBULATORY_CARE_PROVIDER_SITE_OTHER): Payer: Self-pay | Admitting: Cardiology

## 2013-05-13 ENCOUNTER — Encounter: Payer: Self-pay | Admitting: Cardiology

## 2013-05-13 VITALS — BP 145/87 | HR 79 | Ht 64.0 in | Wt 178.4 lb

## 2013-05-13 DIAGNOSIS — E7800 Pure hypercholesterolemia, unspecified: Secondary | ICD-10-CM

## 2013-05-13 DIAGNOSIS — I2510 Atherosclerotic heart disease of native coronary artery without angina pectoris: Secondary | ICD-10-CM

## 2013-05-13 DIAGNOSIS — I208 Other forms of angina pectoris: Secondary | ICD-10-CM

## 2013-05-13 DIAGNOSIS — I10 Essential (primary) hypertension: Secondary | ICD-10-CM

## 2013-05-13 MED ORDER — LISINOPRIL-HYDROCHLOROTHIAZIDE 20-12.5 MG PO TABS: ORAL_TABLET | ORAL | Status: DC

## 2013-05-13 MED ORDER — ISOSORBIDE MONONITRATE ER 120 MG PO TB24: 120.0000 mg | ORAL_TABLET | Freq: Every day | ORAL | Status: DC

## 2013-05-13 MED ORDER — CARVEDILOL 12.5 MG PO TABS: 12.5000 mg | ORAL_TABLET | Freq: Two times a day (BID) | ORAL | Status: DC

## 2013-05-13 MED ORDER — ROSUVASTATIN CALCIUM 10 MG PO TABS: 10.0000 mg | ORAL_TABLET | Freq: Every day | ORAL | Status: DC

## 2013-05-13 NOTE — Patient Instructions (Addendum)
Your physician has recommended you make the following change in your medication:  1)STOP METOPROLOL  2)START CARVEDILOL 12.5MG  TWICE DAILY. AN RX HAS BEEN SENT TO YOUR PHARMACY  Your physician wants you to follow-up in: 6 MONTHS You will receive a reminder letter in the mail two months in advance. If you don't receive a letter, please call our office to schedule the follow-up appointment.

## 2013-05-13 NOTE — Progress Notes (Signed)
**Note Kristie-Identified via Obfuscation** [address] [place], [place]  [address] Phone: [phone](336) [phone] Fax:  [phone](336) [phone]  Date:  05/13/2013   ID:  Kristie Owens, DOB 03/22/1955, MRN [id number]  PCP:  Lenora BoysFRIED, ROBERT L, MD   History of Present Illness: Kristie Owens is a 58 y.o. female with coronary artery disease status post heart catheterization in December of 2012 demonstrating widely patent LAD stent, mild irregularity of the ostium of the diagonal were PTCA was felt to be high risk, here for followup. Previous visit she had described nonexertional, fleeting chest discomfort different from her prior symptoms. We went over her last cardiac catheterization in December of 2012. LAD stent was patent. Diagonal ostial stenosis would be high risk.  I increased her isosorbide at previous visit. She is doing better but feels generalized fatigue. She is considering sleep study. I have given her samples of Bystolic 5 mg to try in place of her Toprol. this did not help. At first..   Married in 11/14. Has palpitations, lack of endurance at times.    Wt Readings from Last 3 Encounters:  05/13/13 178 lb 6.4 oz (80.922 kg)  11/11/12 178 lb (80.74 kg)  01/16/11 170 lb (77.111 kg)     Past Medical History  Diagnosis Date  . CAD (coronary artery disease), native coronary artery     DES to LAD in 2 areas, one at the bifurcation of a diagonal branch and also in the mid to distal region. Her diagonal branch was also ballooned . She has a DES to RCA 10/10. Her circumflex artery mild ostial disease.    Past Surgical History  Procedure Laterality Date  . Cesarean section    . Nose surgery    . Biospy breast    . Foot surgery    . Carotid stent      Current Outpatient Prescriptions  Medication Sig Dispense Refill  . aspirin EC 81 MG tablet Take 81 mg by mouth daily.        . cetirizine (ZYRTEC) 10 MG tablet Take 10 mg by mouth daily as needed. For allergies.      . fish oil-omega-3 fatty acids 1000 MG capsule  Take 4 g by mouth at bedtime.       . isosorbide mononitrate (IMDUR) 60 MG 24 hr tablet Take 120 mg by mouth daily.       [name]. lisinopril-hydrochlorothiazide (PRINZIDE,ZESTORETIC) 20-12.5 MG per tablet TAKE 1 TABLET ONCE A DAY  90 tablet  0  . metoprolol succinate (TOPROL-XL) 100 MG 24 hr tablet Take 1 tablet (100 mg total) by mouth daily.  90 tablet  1  . rosuvastatin (CRESTOR) 10 MG tablet Take 10 mg by mouth daily.        [name]. zolpidem (AMBIEN) 5 MG tablet Take 5 mg by mouth at bedtime.         No current facility-administered medications for this visit.    Allergies:   No Known Allergies  Social History:  The patient  reports that she has never smoked. She does not have any smokeless tobacco history on file. She reports that she drinks alcohol.   ROS:  Please see the history of present illness.   Denies any fevers, chills, orthopnea, PND  PHYSICAL EXAM: VS:  BP 145/87  Pulse 79  Ht 5\' 4"  (1.626 m)  Wt 178 lb 6.4 oz (80.922 kg)  BMI 30.61 kg/m2 Well nourished, well developed, in no acute distress HEENT: normal Neck: no  JVD Cardiac:  normal S1, S2; RRR; no murmur Lungs:  clear to auscultation bilaterally, no wheezing, rhonchi or rales Abd: soft, nontender, no hepatomegaly Ext: no edema Skin: warm and dry Neuro: no focal abnormalities noted  EKG:  05/13/13-sinus rhythm rate 85 with no other abnormalities     ASSESSMENT AND PLAN:  1. Coronary artery disease-status post stent to LAD, widely patent at last cardiac catheterization. Aggressive secondary prevention. 2. Hypertension-multidrug regimen. Mildly elevated. Given some of her fatigue, I will change her from Toprol over to carvedilol. Hopefully this will help. This may also help with her palpitations as well. 3. Angina-isosorbide previously increased as well as beta blocker. I am changing over to carvedilol. 4. Hyperlipidemia-continue with Crestor LDL goal 70. Dr. Foy GuadalajaraFried will be checking. 5. 6 month follow up. She will call me if  any worrisome symptoms develop.  Signed, Donato SchultzMark Skains, MD [facility]  05/13/2013 11:40 AM

## 2013-05-27 ENCOUNTER — Ambulatory Visit: Payer: Self-pay | Admitting: Cardiology

## 2013-07-10 ENCOUNTER — Other Ambulatory Visit: Payer: Self-pay | Admitting: *Deleted

## 2013-07-10 DIAGNOSIS — I2510 Atherosclerotic heart disease of native coronary artery without angina pectoris: Secondary | ICD-10-CM

## 2013-07-10 DIAGNOSIS — I10 Essential (primary) hypertension: Secondary | ICD-10-CM

## 2013-07-10 MED ORDER — CARVEDILOL 12.5 MG PO TABS: 12.5000 mg | ORAL_TABLET | Freq: Two times a day (BID) | ORAL | Status: DC

## 2013-12-15 ENCOUNTER — Ambulatory Visit (INDEPENDENT_AMBULATORY_CARE_PROVIDER_SITE_OTHER): Payer: Managed Care, Other (non HMO) | Admitting: Cardiology

## 2013-12-15 ENCOUNTER — Ambulatory Visit (INDEPENDENT_AMBULATORY_CARE_PROVIDER_SITE_OTHER): Payer: Managed Care, Other (non HMO) | Admitting: *Deleted

## 2013-12-15 ENCOUNTER — Encounter: Payer: Self-pay | Admitting: Cardiology

## 2013-12-15 VITALS — BP 116/72 | HR 67 | Ht 64.0 in | Wt 180.0 lb

## 2013-12-15 DIAGNOSIS — E785 Hyperlipidemia, unspecified: Secondary | ICD-10-CM

## 2013-12-15 DIAGNOSIS — R002 Palpitations: Secondary | ICD-10-CM | POA: Insufficient documentation

## 2013-12-15 DIAGNOSIS — I208 Other forms of angina pectoris: Secondary | ICD-10-CM | POA: Insufficient documentation

## 2013-12-15 DIAGNOSIS — I2089 Other forms of angina pectoris: Secondary | ICD-10-CM

## 2013-12-15 DIAGNOSIS — I10 Essential (primary) hypertension: Secondary | ICD-10-CM

## 2013-12-15 DIAGNOSIS — E782 Mixed hyperlipidemia: Secondary | ICD-10-CM | POA: Insufficient documentation

## 2013-12-15 DIAGNOSIS — Z23 Encounter for immunization: Secondary | ICD-10-CM

## 2013-12-15 DIAGNOSIS — I2510 Atherosclerotic heart disease of native coronary artery without angina pectoris: Secondary | ICD-10-CM

## 2013-12-15 NOTE — Progress Notes (Signed)
[address] [place], [place]  [address] Phone: [phone] Fax:  [phone]  Date:  12/15/2013   ID:  Kristie BryantDeborah D Owens, DOB 10/04/1955, MRN [id number]  PCP:  Kristie Owens, Kristie Owens   History of Present Illness: Kristie Owens is a 58 y.o. female with coronary artery disease status post heart catheterization in December of 2012 demonstrating widely patent LAD stent, mild irregularity of the ostium of the diagonal were PTCA was felt to be high risk, here for followup. Previous visit she had described nonexertional, fleeting chest discomfort different from her prior symptoms. We went over her last cardiac catheterization in December of 2012. LAD stent was patent. Diagonal ostial stenosis would be high risk.  I increased her isosorbide at previous visit.  Married in 11/14. Palpitations, lack of endurance at times.   Kristie MosherJill Owens is sister, colon cancer. Works at [employer]. One of her coworkers recently had a small MI.   Wt Readings from Last 3 Encounters:  12/15/13 180 lb (81.647 kg)  05/13/13 178 lb 6.4 oz (80.922 kg)  11/11/12 178 lb (80.74 kg)     Past Medical History  Diagnosis Date  . CAD (coronary artery disease), native coronary artery     DES to LAD in 2 areas, one at the bifurcation of a diagonal branch and also in the mid to distal region. Her diagonal branch was also ballooned . She has a DES to RCA 10/10. Her circumflex artery mild ostial disease.    Past Surgical History  Procedure Laterality Date  . Cesarean section    . Nose surgery    . Biospy breast    . Foot surgery    . Carotid stent      Current Outpatient Prescriptions  Medication Sig Dispense Refill  . aspirin EC 81 MG tablet Take 81 mg by mouth daily.      . carvedilol (COREG) 12.5 MG tablet Take 1 tablet (12.5 mg total) by mouth 2 (two) times daily. 180 tablet 1  . cetirizine (ZYRTEC) 10 MG tablet Take 10 mg by mouth daily as needed. For allergies.    . fish oil-omega-3 fatty acids  1000 MG capsule Take 4 g by mouth at bedtime.     . isosorbide mononitrate (IMDUR) 120 MG 24 hr tablet Take 1 tablet (120 mg total) by mouth daily. 90 tablet 3  . lisinopril-hydrochlorothiazide (PRINZIDE,ZESTORETIC) 20-12.5 MG per tablet TAKE 1 TABLET ONCE A DAY 90 tablet 3  . rosuvastatin (CRESTOR) 10 MG tablet Take 1 tablet (10 mg total) by mouth daily. 90 tablet 3  . sertraline (ZOLOFT) 50 MG tablet Take 50 mg by mouth daily.    [name]. zolpidem (AMBIEN) 5 MG tablet Take 5 mg by mouth at bedtime.       No current facility-administered medications for this visit.    Allergies:   No Known Allergies  Social History:  The patient  reports that she has never smoked. She does not have any smokeless tobacco history on file. She reports that she drinks alcohol.   ROS:  Please see the history of present illness.   Denies any fevers, chills, orthopnea, PND  PHYSICAL EXAM: VS:  BP 116/72 mmHg  Pulse 67  Ht 5\' 4"  (1.626 m)  Wt 180 lb (81.647 kg)  BMI 30.88 kg/m2 Well nourished, well developed, in no acute distress HEENT: normal Neck: no JVD Cardiac:  normal S1, S2; RRR; no murmur Lungs:  clear to auscultation bilaterally,  no wheezing, rhonchi or rales Abd: soft, nontender, no hepatomegaly Ext: no edema Skin: warm and dry Neuro: no focal abnormalities noted  EKG:  05/13/13-sinus rhythm rate 85 with no other abnormalities     ASSESSMENT AND PLAN:  1. Coronary artery disease-status post stent to LAD, widely patent at last cardiac catheterization. Aggressive secondary prevention. 2. Hypertension-multidrug regimen.Better control from Toprol over to carvedilol. Palpitations reduced as well. 3. Angina-isosorbide previously increased as well as beta blocker. Max dose Imdur. Still having an occasional anginal symptom however this is not debilitating. Discussed Ranexa. Not interested at this time. 4. Hyperlipidemia-continue with Crestor LDL goal 70. Dr. Foy GuadalajaraFried monitoring. Just had blood work in  7/15. 5. Flu shot 6. 6 month follow up. She will call me if any worrisome symptoms develop.  Signed, Donato SchultzMark Eisen Robenson, [facility]  12/15/2013 10:23 AM

## 2013-12-15 NOTE — Patient Instructions (Signed)
The current medical regimen is effective;  continue present plan and medications.  Follow up in 6 months with Dr. Skains.  You will receive a letter in the mail 2 months before you are due.  Please call us when you receive this letter to schedule your follow up appointment.  

## 2014-01-06 ENCOUNTER — Encounter (HOSPITAL_COMMUNITY): Payer: Self-pay | Admitting: Cardiology

## 2014-01-11 ENCOUNTER — Other Ambulatory Visit: Payer: Self-pay | Admitting: Cardiology

## 2014-03-18 ENCOUNTER — Ambulatory Visit (INDEPENDENT_AMBULATORY_CARE_PROVIDER_SITE_OTHER): Payer: Managed Care, Other (non HMO) | Admitting: Physician Assistant

## 2014-03-18 ENCOUNTER — Encounter: Payer: Self-pay | Admitting: *Deleted

## 2014-03-18 ENCOUNTER — Encounter: Payer: Self-pay | Admitting: Physician Assistant

## 2014-03-18 VITALS — BP 110/58 | HR 69 | Ht 64.0 in | Wt 182.0 lb

## 2014-03-18 DIAGNOSIS — I2510 Atherosclerotic heart disease of native coronary artery without angina pectoris: Secondary | ICD-10-CM

## 2014-03-18 DIAGNOSIS — R072 Precordial pain: Secondary | ICD-10-CM

## 2014-03-18 DIAGNOSIS — E785 Hyperlipidemia, unspecified: Secondary | ICD-10-CM

## 2014-03-18 DIAGNOSIS — I10 Essential (primary) hypertension: Secondary | ICD-10-CM

## 2014-03-18 MED ORDER — NITROGLYCERIN 0.4 MG SL SUBL: 0.4000 mg | SUBLINGUAL_TABLET | SUBLINGUAL | Status: DC | PRN

## 2014-03-18 NOTE — Progress Notes (Signed)
Cardiology Office Note   Date:  03/18/2014   ID:  MARGA GRAMAJO, DOB 1955-07-27, MRN [id number]  PCP:  Abigail Miyamoto, MD  Cardiologist:  Dr. Candee Furbish     Chief Complaint  Patient presents with  . Coronary Artery Disease    follow up  . Chest Pain     History of Present Illness: Kristie Owens is a 59 y.o. female with a hx of CAD s/p DES to LAD x 2, prior POBA to Dx and DES to RCA, HTN, HL. Last cardiac catheterization in 2012 demonstrated patent stents in the LAD and RCA. She's had symptoms of chronic angina that have been treated with beta blocker and long-acting nitrates. Last seen by Dr. Marlou Porch 11/2013.  Ranolazine was discussed but the patient was not interested. She returns for follow-up.  Over the last couple of weeks, she has noted increasing amounts of chest discomfort. She has left-sided and right-sided discomfort. She describes it as a pressure or heaviness. She has associated dyspnea, neck pain and left arm pain. She can have symptoms at rest. She will definitely have symptoms with exertion. She seems to describe CCS class 2-3 symptoms. She denies orthopnea, PND or significant pedal edema. She denies syncope. She denies pleuritic chest symptoms. She denies symptoms associated with meals. Her chest symptoms remind her of her previous angina. She has taken nitroglycerin with relief.     Studies/Reports Reviewed Today:  Cardiac Cath 01/16/11 Left main: No angiographically significant disease.   LAD: Stents are widely patent, 40% ostial Dx disease at prox stent. Mid to distal LAD stent widely patent. LCx:  Mild ostial disease which is unchanged when compared to prior cardiac catheterization.  RCA: Mid RCA stent is widely patent. Minor luminal irregularities throughout vessel. EF:  65%    Past Medical History  Diagnosis Date  . CAD (coronary artery disease), native coronary artery     DES to LAD in 2 areas, one at the bifurcation of a diagonal branch and also in  the mid to distal region. Her diagonal branch was also ballooned . She has a DES to RCA 10/10. Her circumflex artery mild ostial disease.    Past Surgical History  Procedure Laterality Date  . Cesarean section    . Nose surgery    . Biospy breast    . Foot surgery    . Carotid stent    . Left heart catheterization with coronary angiogram N/A 01/16/2011    Procedure: LEFT HEART CATHETERIZATION WITH CORONARY ANGIOGRAM;  Surgeon: Candee Furbish, MD;  Location: [facility] CATH LAB;  Service: Cardiovascular;  Laterality: N/A;     Current Outpatient Prescriptions  Medication Sig Dispense Refill  . aspirin EC 81 MG tablet Take 81 mg by mouth daily.      . carvedilol (COREG) 12.5 MG tablet TAKE 1 TABLET (12.5 MG TOTAL) BY MOUTH 2 (TWO) TIMES DAILY. 180 tablet 1  . cetirizine (ZYRTEC) 10 MG tablet Take 10 mg by mouth daily as needed. For allergies.    . fish oil-omega-3 fatty acids 1000 MG capsule Take 4 g by mouth at bedtime.     . fluticasone (FLONASE) 50 MCG/ACT nasal spray Place 2 sprays into both nostrils as needed for allergies or rhinitis.   11  . isosorbide mononitrate (IMDUR) 120 MG 24 hr tablet Take 1 tablet (120 mg total) by mouth daily. 90 tablet 3  . lisinopril-hydrochlorothiazide (PRINZIDE,ZESTORETIC) 20-12.5 MG per tablet TAKE 1 TABLET ONCE A DAY 90 tablet 3  .  nitroGLYCERIN (NITROSTAT) 0.4 MG SL tablet Place 0.4 mg under the tongue every 5 (five) minutes as needed for chest pain.    . rosuvastatin (CRESTOR) 10 MG tablet Take 1 tablet (10 mg total) by mouth daily. 90 tablet 3  . sertraline (ZOLOFT) 50 MG tablet Take 50 mg by mouth daily.    [name] zolpidem (AMBIEN) 10 MG tablet Take 10 mg by mouth at bedtime as needed for sleep.   2   No current facility-administered medications for this visit.    Allergies:   Review of patient's allergies indicates no known allergies.    Social History:  The patient  reports that she has never smoked. She does not have any smokeless tobacco history on file.  She reports that she drinks alcohol.   Family History:  The patient's family history includes Heart attack in her brother and paternal grandmother; Heart disease in her mother; Heart failure in her paternal grandmother; Hypertension in her mother. There is no history of Stroke.    ROS:   Please see the history of present illness.   Review of Systems  Constitution: Negative for fever.  Respiratory: Negative for cough.   Musculoskeletal: Positive for back pain.  Gastrointestinal: Negative for hematochezia and melena.  Genitourinary: Negative for hematuria.  All other systems reviewed and are negative.     PHYSICAL EXAM: VS:  BP 110/58 mmHg  Pulse 69  Ht 5' 4"  (1.626 m)  Wt 182 lb (82.555 kg)  BMI 31.22 kg/m2    Wt Readings from Last 3 Encounters:  03/18/14 182 lb (82.555 kg)  12/15/13 180 lb (81.647 kg)  05/13/13 178 lb 6.4 oz (80.922 kg)     GEN: Well nourished, well developed, in no acute distress HEENT: normal Neck: no JVD, no carotid bruits, no masses Cardiac:  Normal S1/S2, RRR; no murmur, no rubs or gallops, no edema  Respiratory:  clear to auscultation bilaterally, no wheezing, rhonchi or rales. GI: soft, nontender, nondistended, + BS MS: no deformity or atrophy Skin: warm and dry  Neuro:  CNs II-XII intact, Strength and sensation are intact Psych: Normal affect   EKG:  EKG is ordered today.  It demonstrates:   NSR, HR 69, normal axis, anterior Q waves, no ST changes, no change from prior tracing   Recent Labs: No results found for requested labs within last 365 days.    Lipid Panel No results found for: CHOL, TRIG, HDL, CHOLHDL, VLDL, LDLCALC, LDLDIRECT    ASSESSMENT AND PLAN:  1.  Chest pain: She has symptoms concerning for exertional angina. I reviewed her case today with Dr. Marlou Porch who knows her well. She's had discomfort like this in the past and has had a cardiac catheterization as recently as 2012 with patent stents in LAD and RCA. Her symptoms have  escalated recently and are relieved by nitroglycerin. I did discuss with the patient proceeding with cardiac catheterization versus stress testing. Dr. Marlou Porch also met with the patient and we have ultimately decided to proceed with stress testing initially to assess for significant ischemia. We can decide on adjusting her antianginals based upon the results of her stress testing. Could consider adding Ranexa. 2.  Coronary Artery Disease: Proceed with Myoview as noted above. Continue aspirin, carvedilol, lisinopril, isosorbide, Crestor. 3.  Essential hypertension: Blood pressure well controlled. Continue current regimen. 4.  Hyperlipidemia: Continue statin.    Current medicines are reviewed at length with the patient today.  The patient does not have concerns regarding medicines.  The  following changes have been made:  As above.   Labs/ tests ordered today include:   Orders Placed This Encounter  Procedures  . EKG 12-Lead     Disposition:   FU with me or Dr. Candee Furbish  in 2 weeks.   Signed, Versie Starks, MHS 03/18/2014 9:58 AM    [facility] [address], [place], [place]  [address] Phone: [phone]; Fax: [phone]

## 2014-03-18 NOTE — Patient Instructions (Addendum)
A REFILL FOR NTG HAS BEEN SENT IN  Your physician has requested that you have en exercise stress myoview. For further information please visit [email or web address]. Please follow instruction sheet, as given.  Your physician recommends that you schedule a follow-up appointment in: 2-3 weeks with DR. SKAINS OR SCOTT WEAVER, PAC SAME DAY DR. Anne FuSKAINS IS IN THE OFFICE

## 2014-03-18 NOTE — Addendum Note (Signed)
Addended by: Tarri FullerFIATO, CAROL M on: 03/18/2014 10:53 AM   Modules accepted: Orders

## 2014-03-24 ENCOUNTER — Encounter (HOSPITAL_COMMUNITY): Payer: Self-pay

## 2014-03-24 ENCOUNTER — Ambulatory Visit (HOSPITAL_COMMUNITY): Admit: 2014-03-24 | Payer: Self-pay | Admitting: Cardiovascular Disease

## 2014-03-24 ENCOUNTER — Ambulatory Visit (HOSPITAL_COMMUNITY): Payer: Managed Care, Other (non HMO) | Attending: Cardiovascular Disease | Admitting: Radiology

## 2014-03-24 DIAGNOSIS — R079 Chest pain, unspecified: Secondary | ICD-10-CM | POA: Diagnosis not present

## 2014-03-24 DIAGNOSIS — I10 Essential (primary) hypertension: Secondary | ICD-10-CM | POA: Diagnosis not present

## 2014-03-24 DIAGNOSIS — I2510 Atherosclerotic heart disease of native coronary artery without angina pectoris: Secondary | ICD-10-CM

## 2014-03-24 DIAGNOSIS — R0609 Other forms of dyspnea: Secondary | ICD-10-CM | POA: Diagnosis not present

## 2014-03-24 DIAGNOSIS — R072 Precordial pain: Secondary | ICD-10-CM

## 2014-03-24 DIAGNOSIS — R002 Palpitations: Secondary | ICD-10-CM

## 2014-03-24 SURGERY — LEFT HEART CATHETERIZATION WITH CORONARY ANGIOGRAM

## 2014-03-24 MED ORDER — TECHNETIUM TC 99M SESTAMIBI GENERIC - CARDIOLITE
33.0000 | Freq: Once | INTRAVENOUS | Status: AC | PRN
  Administered 2014-03-24: 33 via INTRAVENOUS

## 2014-03-24 MED ORDER — TECHNETIUM TC 99M SESTAMIBI GENERIC - CARDIOLITE
11.0000 | Freq: Once | INTRAVENOUS | Status: AC | PRN
  Administered 2014-03-24: 11 via INTRAVENOUS

## 2014-03-24 NOTE — Progress Notes (Signed)
[facility] SITE 3 NUCLEAR MED [address] [place], [place] [address] [phone]    Cardiology Nuclear Med Study  Kristie BryantDeborah D Owens is a 59 y.o. female     MRN : [id number]     DOB: 03/30/1955  Procedure Date: 03/24/2014  Nuclear Med Background Indication for Stress Test:  Evaluation for Ischemia and Stent Patency History:  CAD-/StentsPCI: 2010 Cardiac Risk Factors: Hypertension  Symptoms:  Chest Pain and DOE   Nuclear Pre-Procedure Caffeine/Decaff Intake:  None> 12 hrs NPO After: 7:00am   Lungs:  clear O2 Sat: 95% on room air. IV 0.9% NS with Angio Cath:  22g  IV Site: R Hand x 1, tolerated well IV Started by:  Irean HongPatsy Edwards, RN  Chest Size (in):  36 Cup Size: B  Height: 5\' 4"  (1.626 m)  Weight:  179 lb (81.194 kg)  BMI:  Body mass index is 30.71 kg/(m^2). Tech Comments:  Patient held Coreg x 24 hrs, and took Lisinopril, and Imdur this am. Irean HongPatsy Edwards, RN.    Nuclear Med Study 1 or 2 day study: 1 day  Stress Test Type:  Stress  Reading MD: N/A  Order Authorizing Provider:  Donato SchultzMark Skains, MD, and Tereso NewcomerScott Weaver, PAC  Resting Radionuclide: Technetium 4676m Sestamibi  Resting Radionuclide Dose: 11.0 mCi   Stress Radionuclide:  Technetium 4176m Sestamibi  Stress Radionuclide Dose: 33.0 mCi           Stress Protocol Rest HR: 66 Stress HR: 150  Rest BP: 104/61 Stress BP: 186/65  Exercise Time (min): 7:01 METS: 8.30   Predicted Max HR: 160 bpm % Max HR: 93.75 bpm Rate Pressure Product: [id number]   Dose of Adenosine (mg):  n/a Dose of Lexiscan: n/a mg  Dose of Atropine (mg): n/a Dose of Dobutamine: n/a mcg/kg/min (at max HR)  Stress Test Technologist: Milana NaSabrina Williams, EMT-P  Nuclear Technologist:  Kerby NoraElzbieta Kubak, CNMT     Rest Procedure:  Myocardial perfusion imaging was performed at rest 45 minutes following the intravenous administration of Technetium 376m Sestamibi. Rest ECG: NSR - Normal EKG  Stress Procedure:  The patient exercised on the treadmill utilizing the  Bruce Protocol for 7:01 minutes. The patient stopped due to fatigue, sob, and denied any chest pain.  Technetium 7876m Sestamibi was injected at peak exercise and myocardial perfusion imaging was performed after a brief delay. Stress ECG: No significant change from baseline ECG  QPS Raw Data Images:  Normal; no motion artifact; normal heart/lung ratio. Stress Images:  Normal homogeneous uptake in all areas of the myocardium. Rest Images:  Normal homogeneous uptake in all areas of the myocardium. Subtraction (SDS):  No evidence of ischemia. Transient Ischemic Dilatation (Normal <1.22):  1.02 Lung/Heart Ratio (Normal <0.45):  0.46  Quantitative Gated Spect Images QGS EDV:  64 ml QGS ESV:  20 ml  Impression Exercise Capacity:  Good exercise capacity. BP Response:  Normal blood pressure response. Clinical Symptoms:  No significant symptoms noted. ECG Impression:  No significant ST segment change suggestive of ischemia. Comparison with Prior Nuclear Study: No images to compare  Overall Impression:  Normal stress nuclear study.  No evidence of ischemia.   LV Ejection Fraction: 69%.  LV Wall Motion:  NL LV Function; NL Wall Motion.   Vesta MixerPhilip J. Nahser, Montez HagemanJr., MD, [facility]ACC 03/24/2014, 4:38 PM [address],  [address] Office [phone] Pager [phone]

## 2014-03-25 ENCOUNTER — Encounter: Payer: Self-pay | Admitting: Physician Assistant

## 2014-03-26 ENCOUNTER — Telehealth: Payer: Self-pay | Admitting: *Deleted

## 2014-03-26 NOTE — Telephone Encounter (Signed)
This is an addendum to my note on the myoview results; Please see note on myoview says pt notified of lab results; this was an error and was supposed to state pt aware of myoview results with verbal understanding.

## 2014-04-01 ENCOUNTER — Encounter: Payer: Self-pay | Admitting: Cardiology

## 2014-04-01 ENCOUNTER — Ambulatory Visit (INDEPENDENT_AMBULATORY_CARE_PROVIDER_SITE_OTHER): Payer: Managed Care, Other (non HMO) | Admitting: Cardiology

## 2014-04-01 VITALS — BP 126/72 | HR 70 | Ht 64.0 in | Wt 182.0 lb

## 2014-04-01 DIAGNOSIS — I10 Essential (primary) hypertension: Secondary | ICD-10-CM

## 2014-04-01 DIAGNOSIS — I2510 Atherosclerotic heart disease of native coronary artery without angina pectoris: Secondary | ICD-10-CM

## 2014-04-01 DIAGNOSIS — E785 Hyperlipidemia, unspecified: Secondary | ICD-10-CM

## 2014-04-01 DIAGNOSIS — I208 Other forms of angina pectoris: Secondary | ICD-10-CM

## 2014-04-01 MED ORDER — CARVEDILOL 6.25 MG PO TABS: 6.2500 mg | ORAL_TABLET | Freq: Two times a day (BID) | ORAL | Status: DC

## 2014-04-01 NOTE — Progress Notes (Signed)
[address] [place], [place]  [address] Phone: [phone] Fax:  [phone]  Date:  04/01/2014   ID:  Kristie Owens, DOB 04/07/1955, MRN [id number]  PCP:  Lenora BoysFRIED, ROBERT L, MD   History of Present Illness: Kristie BryantDeborah D Mcisaac is a 59 y.o. female with coronary artery disease status post heart catheterization in December of 2012 demonstrating widely patent LAD stent, mild irregularity of the ostium of the diagonal were PTCA was felt to be high risk, here for followup. Previous visit she had described nonexertional, fleeting chest discomfort different from her prior symptoms. We went over her last cardiac catheterization in December of 2012. LAD stent was patent. Diagonal ostial stenosis would be high risk.  Her stress test in 2016 was reassuring. No signs of ischemia. Normal function. Incomplete still continue to be fatigue,  sleepiness, lack of energy. Decreasing carvedilol 6.25 mg twice a day.   I increased her isosorbide at previous visit. maximal dosing.   Married in 11/14. Palpitations, lack of endurance at times.   Reola MosherJill Joyner is sister, colon cancer. Works at [employer]. One of her coworkers recently had a small MI.  cruise.   Wt Readings from Last 3 Encounters:  04/01/14 182 lb (82.555 kg)  03/24/14 179 lb (81.194 kg)  03/18/14 182 lb (82.555 kg)     Past Medical History  Diagnosis Date  . CAD (coronary artery disease), native coronary artery     DES to LAD in 2 areas, one at the bifurcation of a diagonal branch and also in the mid to distal region. Her diagonal branch was also ballooned . She has a DES to RCA 10/10. Her circumflex artery mild ostial disease.  [name]. Hx of cardiovascular stress test     ETT- Myoview (2/16):  No ischemia, EF 69%, normal study    Past Surgical History  Procedure Laterality Date  . Cesarean section    . Nose surgery    . Biospy breast    . Foot surgery    . Carotid stent    . Left heart catheterization with coronary  angiogram N/A 01/16/2011    Procedure: LEFT HEART CATHETERIZATION WITH CORONARY ANGIOGRAM;  Surgeon: Donato SchultzMark Cliffard Hair, MD;  [facility];  Service: Cardiovascular;  Laterality: N/A;    Current Outpatient Prescriptions  Medication Sig Dispense Refill  . aspirin EC 81 MG tablet Take 81 mg by mouth daily.      . carvedilol (COREG) 12.5 MG tablet TAKE 1 TABLET (12.5 MG TOTAL) BY MOUTH 2 (TWO) TIMES DAILY. 180 tablet 1  . cetirizine (ZYRTEC) 10 MG tablet Take 10 mg by mouth daily as needed. For allergies.    . fish oil-omega-3 fatty acids 1000 MG capsule Take 4 g by mouth at bedtime.     . fluticasone (FLONASE) 50 MCG/ACT nasal spray Place 2 sprays into both nostrils as needed for allergies or rhinitis.   11  . isosorbide mononitrate (IMDUR) 120 MG 24 hr tablet Take 1 tablet (120 mg total) by mouth daily. 90 tablet 3  . lisinopril-hydrochlorothiazide (PRINZIDE,ZESTORETIC) 20-12.5 MG per tablet TAKE 1 TABLET ONCE A DAY 90 tablet 3  . Multiple Vitamin (MULTIVITAMIN) capsule Take 1 capsule by mouth daily.    . nitroGLYCERIN (NITROSTAT) 0.4 MG SL tablet Place 1 tablet (0.4 mg total) under the tongue every 5 (five) minutes as needed for chest pain. 25 tablet 3  . rosuvastatin (CRESTOR) 10 MG tablet Take 1 tablet (10 mg total)  by mouth daily. 90 tablet 3  . sertraline (ZOLOFT) 50 MG tablet Take 50 mg by mouth daily.    [name] zolpidem (AMBIEN) 10 MG tablet Take 10 mg by mouth at bedtime as needed for sleep.   2   No current facility-administered medications for this visit.    Allergies:   No Known Allergies  Social History:  The patient  reports that she has never smoked. She does not have any smokeless tobacco history on file. She reports that she drinks alcohol.   ROS:  Please see the history of present illness.   Denies any fevers, chills, orthopnea, PND  PHYSICAL EXAM: VS:  BP 126/72 mmHg  Pulse 70  Ht  (1.626 m)  Wt 182 lb (82.555 kg)  BMI 31.22 kg/m2 Well nourished, well developed,  in no acute distress HEENT: normal Neck: no JVD Cardiac:  normal S1, S2; RRR; no murmur Lungs:  clear to auscultation bilaterally, no wheezing, rhonchi or rales Abd: soft, nontender, no hepatomegaly Ext: no edema Skin: warm and dry Neuro: no focal abnormalities noted  EKG:  05/13/13-sinus rhythm rate 85 with no other abnormalities     ASSESSMENT AND PLAN:  1. Coronary artery disease-status post stent to LAD, widely patent at last cardiac catheterization. Aggressive secondary prevention. nuclear stress test 03/2014 reassuring, no ischemia.  2. Hypertension-multidrug regimen.Better control from Toprol over to carvedilol. Palpitations reduced as well. 3. Angina-isosorbide previously increased as well as beta blocker. I will decrease her carvedilol to 6.25 mg twice a day. Max dose Imdur. Still having an occasional anginal symptom however this is not debilitating. Discussed Ranexa. Not interested at this time. 4. Hyperlipidemia-continue with Crestor LDL goal 70. Dr. Foy Guadalajara monitoring. 5. Fatigue-consider sleep study.  6. 6 month follow up. She will call me if any worrisome symptoms develop.  Signed, Donato Schultz, MD [facility]  04/01/2014 2:21 PM

## 2014-04-01 NOTE — Patient Instructions (Addendum)
Decrease Carvedilol to 6.25 mg twice a day. Continue all other medications as listed.  Follow up in 6 months with Dr. Anne FuSkains.  You will receive a letter in the mail 2 months before you are due.  Please call us when you receive this letter to schedule your follow up appointment.  Thank you for choosing [facility]!!

## 2014-05-22 ENCOUNTER — Other Ambulatory Visit: Payer: Self-pay | Admitting: Cardiology

## 2014-06-05 ENCOUNTER — Other Ambulatory Visit: Payer: Self-pay | Admitting: Cardiology

## 2014-07-12 ENCOUNTER — Other Ambulatory Visit: Payer: Self-pay | Admitting: Cardiology

## 2014-08-17 ENCOUNTER — Other Ambulatory Visit: Payer: Self-pay | Admitting: Cardiology

## 2014-10-12 ENCOUNTER — Ambulatory Visit (INDEPENDENT_AMBULATORY_CARE_PROVIDER_SITE_OTHER): Payer: Managed Care, Other (non HMO) | Admitting: Cardiology

## 2014-10-12 ENCOUNTER — Encounter: Payer: Self-pay | Admitting: Cardiology

## 2014-10-12 VITALS — BP 110/70 | HR 80 | Ht 64.0 in | Wt 216.0 lb

## 2014-10-12 DIAGNOSIS — I25118 Atherosclerotic heart disease of native coronary artery with other forms of angina pectoris: Secondary | ICD-10-CM | POA: Diagnosis not present

## 2014-10-12 DIAGNOSIS — E785 Hyperlipidemia, unspecified: Secondary | ICD-10-CM

## 2014-10-12 DIAGNOSIS — I10 Essential (primary) hypertension: Secondary | ICD-10-CM | POA: Diagnosis not present

## 2014-10-12 NOTE — Progress Notes (Signed)
[address] [place], [place]  [address] Phone: [phone] Fax:  [phone]  Date:  10/12/2014   ID:  Kristie Owens, DOB 09-15-1955, MRN [id number]  PCP:  Lenora Boys, MD   History of Present Illness: Kristie Owens is a 59 y.o. female with coronary artery disease status post heart catheterization in December of 2012 demonstrating widely patent LAD stent, mild irregularity of the ostium of the diagonal were PTCA was felt to be high risk, here for followup. Previous visit she had described nonexertional, fleeting chest discomfort different from her prior symptoms. We went over her last cardiac catheterization in December of 2012. LAD stent was patent. Diagonal ostial stenosis would be high risk.  Her stress test in 2016 was reassuring. No signs of ischemia. Normal function. Incomplete still continue to be fatigue,  sleepiness, lack of energy. Decreasing carvedilol 6.25 mg twice a day.   I increased her isosorbide at previous visit. maximal dosing.   Married in 11/14. Palpitations, lack of endurance at times.   [name] is sister, colon cancer. Works at [employer]. One of her coworkers recently had a small MI.  10/12/14-overall doing well, no change. She has occasional anginal symptoms but these are controlled with current medications. She thinks that the carvedilol helped her out the most.    Wt Readings from Last 3 Encounters:  10/12/14 216 lb (97.977 kg)  04/01/14 182 lb (82.555 kg)  03/24/14 179 lb (81.194 kg)     Past Medical History  Diagnosis Date  . CAD (coronary artery disease), native coronary artery     DES to LAD in 2 areas, one at the bifurcation of a diagonal branch and also in the mid to distal region. Her diagonal branch was also ballooned . She has a DES to RCA 10/10. Her circumflex artery mild ostial disease.  [name] Hx of cardiovascular stress test     ETT- Myoview (2/16):  No ischemia, EF 69%, normal study    Past Surgical History    Procedure Laterality Date  . Cesarean section    . Nose surgery    . Biospy breast    . Foot surgery    . Carotid stent    . Left heart catheterization with coronary angiogram N/A 01/16/2011    Procedure: LEFT HEART CATHETERIZATION WITH CORONARY ANGIOGRAM;  Surgeon: Donato Schultz, MD;  Location: [facility] C[facility] LAB;  Service: Cardiovascular;  Laterality: N/A;    Current Outpatient Prescriptions  Medication Sig Dispense Refill  . aspirin EC 81 MG tablet Take 81 mg by mouth daily.      . cetirizine (ZYRTEC) 10 MG tablet Take 10 mg by mouth daily as needed. For allergies.    [name] CRESTOR 10 MG tablet TAKE 1 TABLET BY MOUTH EVERY DAY 90 tablet 1  . fish oil-omega-3 fatty acids 1000 MG capsule Take 4 g by mouth at bedtime.     . fluticasone (FLONASE) 50 MCG/ACT nasal spray Place 2 sprays into both nostrils as needed for allergies or rhinitis.   11  . isosorbide mononitrate (IMDUR) 120 MG 24 hr tablet TAKE 1 TABLET BY MOUTH EVERY DAY 90 tablet 1  . lisinopril-hydrochlorothiazide (PRINZIDE,ZESTORETIC) 20-12.5 MG per tablet TAKE 1 TABLET ONCE A DAY 90 tablet 2  . Multiple Vitamin (MULTIVITAMIN) capsule Take 1 capsule by mouth daily.    . nitroGLYCERIN (NITROSTAT) 0.4 MG SL tablet Place 1 tablet (0.4 mg total) under the tongue every 5 (five) minutes as  needed for chest pain. 25 tablet 3  . sertraline (ZOLOFT) 50 MG tablet Take 50 mg by mouth daily.    [name] zolpidem (AMBIEN) 10 MG tablet Take 10 mg by mouth at bedtime as needed for sleep.   2  . carvedilol (COREG) 12.5 MG tablet TAKE 1 TABLET (12.5 MG TOTAL) BY MOUTH 2 (TWO) TIMES DAILY. 90 tablet 0  . carvedilol (COREG) 6.25 MG tablet Take 1 tablet (6.25 mg total) by mouth 2 (two) times daily with a meal. (Patient not taking: Reported on 10/12/2014)     No current facility-administered medications for this visit.    Allergies:   No Known Allergies  Social History:  The patient  reports that she has never smoked. She does not have any smokeless tobacco history  on file. She reports that she drinks alcohol.   ROS:  Please see the history of present illness.   Denies any fevers, chills, orthopnea, PND  PHYSICAL EXAM: VS:  BP 110/70 mmHg  Pulse 80  Ht  (1.626 m)  Wt 216 lb (97.977 kg)  BMI 37.06 kg/m2 Well nourished, well developed, in no acute distress HEENT: normal Neck: no JVD Cardiac:  normal S1, S2; RRR; no murmur Lungs:  clear to auscultation bilaterally, no wheezing, rhonchi or rales Abd: soft, nontender, no hepatomegaly Ext: no edema Skin: warm and dry Neuro: no focal abnormalities noted  EKG:  05/13/13-sinus rhythm rate 85 with no other abnormalities     ASSESSMENT AND PLAN:  1. Coronary artery disease-status post stent to LAD, widely patent at last cardiac catheterization. Aggressive secondary prevention. nuclear stress test 03/2014 reassuring, no ischemia.  2. Hypertension-multidrug regimen.Better control from Toprol over to carvedilol. Palpitations reduced as well. 3. Angina-isosorbide previously increased as well as beta blocker. I will decrease her carvedilol to 6.25 mg twice a day. Max dose Imdur. Still having an occasional anginal symptom however this is not debilitating. Discussed Ranexa. Not interested at this time. Overall she is doing well. 4. Hyperlipidemia-continue with Crestor LDL goal 70, last value 93. HDL 40. Continue dietary modifications as well as Crestor. 5. Fatigue-consider sleep study.  6. 12 month follow up. She will call me if any worrisome symptoms develop.  Signed, Donato Schultz, MD [facility]  10/12/2014 2:40 PM

## 2014-10-12 NOTE — Patient Instructions (Signed)
Medication Instructions:  The current medical regimen is effective;  continue present plan and medications.  Follow-Up: Follow up in 1 year with Dr. Skains.  You will receive a letter in the mail 2 months before you are due.  Please call us when you receive this letter to schedule your follow up appointment.  Thank you for choosing [facility]!!     

## 2014-11-24 ENCOUNTER — Other Ambulatory Visit: Payer: Self-pay | Admitting: Cardiology

## 2015-02-23 ENCOUNTER — Other Ambulatory Visit: Payer: Self-pay | Admitting: Cardiology

## 2015-03-17 ENCOUNTER — Other Ambulatory Visit: Payer: Self-pay | Admitting: Cardiology

## 2015-05-21 ENCOUNTER — Other Ambulatory Visit: Payer: Self-pay | Admitting: Cardiology

## 2015-05-23 ENCOUNTER — Other Ambulatory Visit: Payer: Self-pay

## 2015-05-23 ENCOUNTER — Telehealth: Payer: Self-pay | Admitting: Cardiology

## 2015-05-23 MED ORDER — CARVEDILOL 12.5 MG PO TABS: 12.5000 mg | ORAL_TABLET | Freq: Two times a day (BID) | ORAL | Status: DC

## 2015-05-23 NOTE — Telephone Encounter (Signed)
New message        *STAT* If patient is at the pharmacy, call can be transferred to refill team.   1. Which medications need to be refilled? (please list name of each medication and dose if known)  Carvedilol 12.5mg   2. Which pharmacy/location (including street and city if local pharmacy) is medication to be sent to? [facility] in [place] 3. Do they need a 30 day or 90 day supply? 90 day supply

## 2015-05-23 NOTE — Telephone Encounter (Signed)
This rx was already sent in today.

## 2015-05-23 NOTE — Telephone Encounter (Signed)
Jake BatheMark C Skains, MD at 10/12/2014 2:34 PM  carvedilol (COREG) 12.5 MG tabletTAKE 1 TABLET (12.5 MG TOTAL) BY MOUTH 2 (TWO) TIMES DAILY Patient Instructions     Medication Instructions:  The current medical regimen is effective; continue present plan and medications.

## 2015-07-22 ENCOUNTER — Telehealth: Payer: Self-pay | Admitting: Cardiology

## 2015-07-22 NOTE — Telephone Encounter (Signed)
Pt is c/o having SOB with activity, chest tightness and fatigue.  She has been doing a lot of yard work for the last 3 weeks and doesn't know if her s/s are r/t this or something is going on with her heart.  No amount of rest causes s/s to resolve.  She reports the chest pressure goes up into her throat and is there about 95% of the time.  There is no other radiation.  She has tried ASA and advil for her s/s because she is unsure whether they are musculoskeletal or not.  She has not tried SL ntg.  Advised to try using SL to see if it helps s/s.  Reviewed instructions on how to use and when to go to the ED.  Scheduled her to be seen on Monday by Dr Anne FuSkains for further evaluations.  She will c/b if further concerns prior to then.

## 2015-07-22 NOTE — Telephone Encounter (Signed)
New message   Patient calling    Pt c/o Shortness Of Breath: STAT if SOB developed within the last 24 hours or pt is noticeably SOB on the phone  1. Are you currently SOB (can you hear that pt is SOB on the phone)? No   2. How long have you been experiencing SOB? Started early this week - doing a lot of yard work    3. Are you SOB when sitting or when up moving around? Moving around   4. Are you currently experiencing any other symptoms? Chest pressure kinda goes to throat hard to describe .   Pt c/o of Chest Pain: STAT if CP now or developed within 24 hours  1. Are you having CP right now? Yes more of Pressure   2. Are you experiencing any other symptoms (ex. SOB, nausea, vomiting, sweating)? Sweating - working in the yard   3. How long have you been experiencing CP? Started this week    4. Is your CP continuous or coming and going? Continuous    5. Have you taken Nitroglycerin? No - taken ASA.   ?

## 2015-07-25 ENCOUNTER — Ambulatory Visit (INDEPENDENT_AMBULATORY_CARE_PROVIDER_SITE_OTHER): Payer: Managed Care, Other (non HMO) | Admitting: Cardiology

## 2015-07-25 ENCOUNTER — Encounter: Payer: Self-pay | Admitting: Cardiology

## 2015-07-25 ENCOUNTER — Other Ambulatory Visit: Payer: Self-pay | Admitting: Cardiology

## 2015-07-25 ENCOUNTER — Telehealth (HOSPITAL_COMMUNITY): Payer: Self-pay | Admitting: *Deleted

## 2015-07-25 VITALS — BP 116/64 | HR 74 | Ht 64.0 in | Wt 179.4 lb

## 2015-07-25 DIAGNOSIS — I25118 Atherosclerotic heart disease of native coronary artery with other forms of angina pectoris: Secondary | ICD-10-CM

## 2015-07-25 DIAGNOSIS — I208 Other forms of angina pectoris: Secondary | ICD-10-CM

## 2015-07-25 DIAGNOSIS — E785 Hyperlipidemia, unspecified: Secondary | ICD-10-CM | POA: Diagnosis not present

## 2015-07-25 DIAGNOSIS — I2089 Other forms of angina pectoris: Secondary | ICD-10-CM

## 2015-07-25 DIAGNOSIS — R079 Chest pain, unspecified: Secondary | ICD-10-CM | POA: Diagnosis not present

## 2015-07-25 DIAGNOSIS — I10 Essential (primary) hypertension: Secondary | ICD-10-CM

## 2015-07-25 NOTE — Telephone Encounter (Signed)
Patient given detailed instructions per Myocardial Perfusion Study Information Sheet for the test on 07/26/15 at 7:30. Patient notified to arrive 15 minutes early and that it is imperative to arrive on time for appointment to keep from having the test rescheduled.  If you need to cancel or reschedule your appointment, please call the office within 24 hours of your appointment. Failure to do so may result in a cancellation of your appointment, and a $50 no show fee. Patient verbalized understanding.Daneil DolinSharon S Brooks

## 2015-07-25 NOTE — Progress Notes (Signed)
[address] [place], [place]  [address] Phone: [phone] Fax:  [phone]  Date:  07/25/2015   ID:  Kristie BryantDeborah D Janis, DOB 09/01/1955, MRN [id number]  PCP:  Lenora BoysFRIED, ROBERT L, MD   History of Present Illness: Kristie Owens is a 60 y.o. female with coronary artery disease status post heart catheterization in December of 2012 demonstrating widely patent LAD and RCA stent, mild irregularity of the ostium of the diagonal were PTCA was felt to be high risk, here for followup. She called us on 07/22/15 complaining of chest pain/tightness, fatigue, shortness of breath with activity. She been doing a lot of yard work over the past 3 weeks and doesn't know if her symptoms were related to her heart no amount of rest seems to resolve symptoms. Pressure goes up into her throat about 95% of the time. No other radiation. She is worried about this. Her hands and ankles seem to be swelling as well over the past 2-3 weeks especially after hot yard work. She's been short of breath off and on as well.  Previous visit she had described nonexertional, fleeting chest discomfort different from her prior symptoms. We went over her last cardiac catheterization in December of 2012. LAD stent was patent. Diagonal ostial stenosis would be high risk.  Her stress test in 2016 was reassuring. No signs of ischemia. Normal function. Still continue to be fatigue,  sleepiness, lack of energy. Decreasing carvedilol 6.25 mg twice a day.   I increased her isosorbide at previous visit. maximal dosing.   Married in 11/14. Palpitations, lack of endurance at times.   Reola MosherJill Joyner is sister, colon cancer. Works at [employer]. One of her coworkers had a small MI.   Wt Readings from Last 3 Encounters:  07/25/15 179 lb 6.4 oz (81.375 kg)  10/12/14 216 lb (97.977 kg)  04/01/14 182 lb (82.555 kg)     Past Medical History  Diagnosis Date  . CAD (coronary artery disease), native coronary artery     DES to LAD in 2  areas, one at the bifurcation of a diagonal branch and also in the mid to distal region. Her diagonal branch was also ballooned . She has a DES to RCA 10/10. Her circumflex artery mild ostial disease.  [name]. Hx of cardiovascular stress test     ETT- Myoview (2/16):  No ischemia, EF 69%, normal study    Past Surgical History  Procedure Laterality Date  . Cesarean section    . Nose surgery    . Biospy breast    . Foot surgery    . Carotid stent    . Left heart catheterization with coronary angiogram N/A 01/16/2011    Procedure: LEFT HEART CATHETERIZATION WITH CORONARY ANGIOGRAM;  Surgeon: Donato SchultzMark Akito Boomhower, MD;  [facility];  Service: Cardiovascular;  Laterality: N/A;    Current Outpatient Prescriptions  Medication Sig Dispense Refill  . aspirin EC 81 MG tablet Take 81 mg by mouth daily.      . carvedilol (COREG) 12.5 MG tablet Take 6.25 mg by mouth 2 (two) times daily with a meal.    . cetirizine (ZYRTEC) 10 MG tablet Take 10 mg by mouth daily as needed. For allergies.    . fish oil-omega-3 fatty acids 1000 MG capsule Take 4 g by mouth at bedtime.     . fluticasone (FLONASE) 50 MCG/ACT nasal spray Place 2 sprays into both nostrils as needed for allergies or rhinitis.  11  . isosorbide mononitrate (IMDUR) 120 MG 24 hr tablet TAKE 1 TABLET BY MOUTH EVERY DAY 90 tablet 2  . lisinopril-hydrochlorothiazide (PRINZIDE,ZESTORETIC) 20-12.5 MG tablet Take 1 tablet by mouth daily. 90 tablet 1  . Multiple Vitamin (MULTIVITAMIN) capsule Take 1 capsule by mouth daily.    . nitroGLYCERIN (NITROSTAT) 0.4 MG SL tablet Place 1 tablet (0.4 mg total) under the tongue every 5 (five) minutes as needed for chest pain. 25 tablet 3  . rosuvastatin (CRESTOR) 10 MG tablet TAKE 1 TABLET BY MOUTH EVERY DAY 90 tablet 2  . sertraline (ZOLOFT) 50 MG tablet Take 50 mg by mouth daily.    [name]. zolpidem (AMBIEN) 10 MG tablet Take 10 mg by mouth at bedtime as needed for sleep.   2   No current facility-administered  medications for this visit.    Allergies:   No Known Allergies  Social History:  The patient  reports that she has never smoked. She does not have any smokeless tobacco history on file. She reports that she drinks alcohol.   ROS:  Please see the history of present illness.   Denies any fevers, chills, orthopnea, PND  PHYSICAL EXAM: VS:  BP 116/64 mmHg  Pulse 74  Ht 5\' 4"  (1.626 m)  Wt 179 lb 6.4 oz (81.375 kg)  BMI 30.78 kg/m2 Well nourished, well developed, in no acute distress HEENT: normal Neck: no JVD Cardiac:  normal S1, S2; RRR; no murmur Lungs:  clear to auscultation bilaterally, no wheezing, rhonchi or rales Abd: soft, nontender, no hepatomegaly Ext: no edema Skin: warm and dry Neuro: no focal abnormalities noted  EKG:  EKG was ordered today 07/25/15- sinus rhythm, 75, no other abnormalities. 05/13/13-sinus rhythm rate 85 with no other abnormalities     ASSESSMENT AND PLAN:  1. Coronary artery disease-status post stent to LAD, widely patent at last cardiac catheterization on 01/16/11. Aggressive secondary prevention. nuclear stress test 03/2014 reassuring, no ischemia. However, given her recent change in symptoms especially with heavy exertional activity in the yard, we will go ahead and perform nuclear stress test to make sure that there are no high risk ischemic areas. If necessary, we can proceed with cardiac catheterization. 2. Hypertension-multidrug regimen.  Better control from Toprol over to carvedilol. Palpitations reduced as well. She is still feeling tired however. 3. Angina-isosorbide previously increased as well as beta blocker. I will decrease her carvedilol to 6.25 mg twice a day. Max dose Imdur.  Discussed Ranexa in the past. Not interested at this time. Overall she is doing well. 4. Hyperlipidemia-continue with Crestor LDL goal 70, last value 93. HDL 40. Continue dietary modifications as well as Crestor. 5. Fatigue-consider sleep study. Has been chronic  symptom. 6. 6 month follow up. She will call me if any worrisome symptoms develop.  Signed, Donato SchultzMark Makenzie Vittorio, MD [facility]  07/25/2015 9:26 AM

## 2015-07-25 NOTE — Patient Instructions (Signed)
Medication Instructions:  The current medical regimen is effective;  continue present plan and medications.  Testing/Procedures: Your physician has requested that you have a myoview. For further information please visit [email or web address]. Please follow instruction sheet, as given.  Follow-Up: Follow up in 6 months with Dr. Skains.  You will receive a letter in the mail 2 months before you are due.  Please call us when you receive this letter to schedule your follow up appointment.  If you need a refill on your cardiac medications before your next appointment, please call your pharmacy.  Thank you for choosing [facility]!!     

## 2015-07-26 ENCOUNTER — Ambulatory Visit (HOSPITAL_COMMUNITY): Payer: Managed Care, Other (non HMO) | Attending: Cardiology

## 2015-07-26 DIAGNOSIS — I10 Essential (primary) hypertension: Secondary | ICD-10-CM | POA: Insufficient documentation

## 2015-07-26 DIAGNOSIS — R079 Chest pain, unspecified: Secondary | ICD-10-CM | POA: Diagnosis not present

## 2015-07-26 DIAGNOSIS — R0609 Other forms of dyspnea: Secondary | ICD-10-CM | POA: Diagnosis not present

## 2015-07-26 DIAGNOSIS — R9439 Abnormal result of other cardiovascular function study: Secondary | ICD-10-CM | POA: Insufficient documentation

## 2015-07-26 LAB — MYOCARDIAL PERFUSION IMAGING
Estimated workload: 9.8 METS
Exercise duration (min): 8 min
Exercise duration (sec): 0 s
LV dias vol: 76 mL (ref 46–106)
LV sys vol: 21 mL
MPHR: 159 {beats}/min
Peak HR: 127 {beats}/min
Percent HR: 80 %
RATE: 0.37
RPE: 19
Rest HR: 56 {beats}/min
SDS: 7
SRS: 4
SSS: 11
TID: 1.02

## 2015-07-26 MED ORDER — TECHNETIUM TC 99M TETROFOSMIN IV KIT
10.2000 | PACK | Freq: Once | INTRAVENOUS | Status: AC | PRN
  Administered 2015-07-26: 10 via INTRAVENOUS
  Filled 2015-07-26: qty 10

## 2015-07-26 MED ORDER — TECHNETIUM TC 99M TETROFOSMIN IV KIT
32.6000 | PACK | Freq: Once | INTRAVENOUS | Status: AC | PRN
  Administered 2015-07-26: 33 via INTRAVENOUS
  Filled 2015-07-26: qty 33

## 2015-07-26 MED ORDER — REGADENOSON 0.4 MG/5ML IV SOLN
0.4000 mg | Freq: Once | INTRAVENOUS | Status: AC
  Administered 2015-07-26: 0.4 mg via INTRAVENOUS

## 2015-08-22 ENCOUNTER — Other Ambulatory Visit: Payer: Self-pay

## 2015-08-22 MED ORDER — CARVEDILOL 12.5 MG PO TABS: 6.2500 mg | ORAL_TABLET | Freq: Two times a day (BID) | ORAL | 1 refills | Status: DC

## 2015-09-20 ENCOUNTER — Other Ambulatory Visit: Payer: Self-pay | Admitting: Family Medicine

## 2015-09-20 DIAGNOSIS — Z1231 Encounter for screening mammogram for malignant neoplasm of breast: Secondary | ICD-10-CM

## 2015-10-04 ENCOUNTER — Ambulatory Visit
Admission: RE | Admit: 2015-10-04 | Discharge: 2015-10-04 | Disposition: A | Discharge status: Home / Self Care | Payer: Managed Care, Other (non HMO) | Source: Ambulatory Visit | Attending: Family Medicine | Admitting: Family Medicine

## 2015-10-04 DIAGNOSIS — Z1231 Encounter for screening mammogram for malignant neoplasm of breast: Secondary | ICD-10-CM

## 2015-11-14 ENCOUNTER — Other Ambulatory Visit: Payer: Self-pay | Admitting: Cardiology

## 2016-01-17 ENCOUNTER — Other Ambulatory Visit: Payer: Self-pay | Admitting: Cardiology

## 2016-02-16 ENCOUNTER — Other Ambulatory Visit: Payer: Self-pay | Admitting: Cardiology

## 2016-04-08 DIAGNOSIS — Z955 Presence of coronary angioplasty implant and graft: Secondary | ICD-10-CM | POA: Insufficient documentation

## 2016-04-08 DIAGNOSIS — K589 Irritable bowel syndrome without diarrhea: Secondary | ICD-10-CM | POA: Insufficient documentation

## 2016-04-09 LAB — HM COLONOSCOPY

## 2016-05-20 ENCOUNTER — Other Ambulatory Visit: Payer: Self-pay | Admitting: Cardiology

## 2016-05-30 ENCOUNTER — Ambulatory Visit (INDEPENDENT_AMBULATORY_CARE_PROVIDER_SITE_OTHER): Payer: Managed Care, Other (non HMO) | Admitting: Cardiology

## 2016-05-30 ENCOUNTER — Encounter: Payer: Self-pay | Admitting: Cardiology

## 2016-05-30 VITALS — BP 128/68 | HR 69 | Ht 64.0 in | Wt 189.9 lb

## 2016-05-30 DIAGNOSIS — I2510 Atherosclerotic heart disease of native coronary artery without angina pectoris: Secondary | ICD-10-CM

## 2016-05-30 DIAGNOSIS — I208 Other forms of angina pectoris: Secondary | ICD-10-CM

## 2016-05-30 DIAGNOSIS — R079 Chest pain, unspecified: Secondary | ICD-10-CM

## 2016-05-30 DIAGNOSIS — E7800 Pure hypercholesterolemia, unspecified: Secondary | ICD-10-CM

## 2016-05-30 LAB — CBC WITH DIFFERENTIAL/PLATELET
Basophils Absolute: 0 10*3/uL (ref 0.0–0.2)
Basos: 1 %
EOS (ABSOLUTE): 0.2 10*3/uL (ref 0.0–0.4)
Eos: 5 %
Hematocrit: 39 % (ref 34.0–46.6)
Hemoglobin: 13.3 g/dL (ref 11.1–15.9)
Immature Grans (Abs): 0 10*3/uL (ref 0.0–0.1)
Immature Granulocytes: 0 %
Lymphocytes Absolute: 2 10*3/uL (ref 0.7–3.1)
Lymphs: 48 %
MCH: 28 pg (ref 26.6–33.0)
MCHC: 34.1 g/dL (ref 31.5–35.7)
MCV: 82 fL (ref 79–97)
Monocytes Absolute: 0.4 10*3/uL (ref 0.1–0.9)
Monocytes: 10 %
Neutrophils Absolute: 1.5 10*3/uL (ref 1.4–7.0)
Neutrophils: 36 %
Platelets: 331 10*3/uL (ref 150–379)
RBC: 4.75 x10E6/uL (ref 3.77–5.28)
RDW: 15.3 % (ref 12.3–15.4)
WBC: 4.2 10*3/uL (ref 3.4–10.8)

## 2016-05-30 LAB — BASIC METABOLIC PANEL
BUN/Creatinine Ratio: 22 (ref 12–28)
BUN: 16 mg/dL (ref 8–27)
CO2: 21 mmol/L (ref 18–29)
Calcium: 9.6 mg/dL (ref 8.7–10.3)
Chloride: 99 mmol/L (ref 96–106)
Creatinine, Ser: 0.72 mg/dL (ref 0.57–1.00)
GFR calc Af Amer: 104 mL/min/{1.73_m2} (ref >59)
GFR calc non Af Amer: 90 mL/min/{1.73_m2} (ref >59)
Glucose: 125 mg/dL — ABNORMAL HIGH (ref 65–99)
Potassium: 4.3 mmol/L (ref 3.5–5.2)
Sodium: 139 mmol/L (ref 134–144)

## 2016-05-30 LAB — PROTIME-INR
INR: 1 (ref 0.8–1.2)
Prothrombin Time: 10.2 s (ref 9.1–12.0)

## 2016-05-30 LAB — APTT: aPTT: 26 s (ref 24–33)

## 2016-05-30 NOTE — Patient Instructions (Addendum)
Medication Instructions:  Your physician recommends that you continue on your current medications as directed. Please refer to the Current Medication list given to you today.   Labwork: TODAY: BMET, CBC, PT, INR  Testing/Procedures: Your physician has requested that you have a cardiac catheterization. Cardiac catheterization is used to diagnose and/or treat various heart conditions. Doctors may recommend this procedure for a number of different reasons. The most common reason is to evaluate chest pain. Chest pain can be a symptom of coronary artery disease (CAD), and cardiac catheterization can show whether plaque is narrowing or blocking your heart's arteries. This procedure is also used to evaluate the valves, as well as measure the blood flow and oxygen levels in different parts of your heart. For further information please visit [email or web address] Please follow instruction sheet, as given.  Follow-Up: Your physician wants you to follow-up in: 6 months with Dr. Anne Fu. You will receive a reminder letter in the mail two months in advance. If you don't receive a letter, please call our office to schedule the follow-up appointment.    Any Other Special Instructions Will Be Listed Below (If Applicable).   [facility] CARDIOVASCULAR DIVISION [facility] ST OFFICE [address], [address] [place] [address] Dept: [phone] Loc: [phone]  ELGENE CORAL  05/30/2016  You are scheduled for a Cardiac Catheterization on Monday, May 7 with Dr. Verdis Prime.  1. Please arrive at the [facility] (Main Entrance A) at [facility]: [address] [place], [place] [address] at 8:30 AM (two hours before your procedure to ensure your preparation). Free valet parking service is available.   Special note: Every effort is made to have your procedure done on time. Please understand that emergencies sometimes delay scheduled procedures.  2. Diet: Do  not eat or drink anything after midnight prior to your procedure except sips of water to take medications.  3. Labs: Your labs are being drawn today (5/2).  4. Medication instructions in preparation for your procedure:  1) Hold PRINZIDE until after your procedure  2) On the morning of your procedure, take your Aspirin.  You may use sips of water.  5. Plan for one night stay--bring personal belongings. 6. Bring a current list of your medications and current insurance cards. 7. You MUST have a responsible person to drive you home. 8. Someone MUST be with you the first 24 hours after you arrive home or your discharge will be delayed. 9. Please wear clothes that are easy to get on and off and wear slip-on shoes.  Thank you for allowing [place] to care for you!   -- [facility]    If you need a refill on your cardiac medications before your next appointment, please call your pharmacy.

## 2016-05-30 NOTE — Progress Notes (Signed)
[address] [place], [place]  [address] Phone: [phone] Fax:  [phone]  Date:  05/30/2016   ID:  Kristie Owens, DOB 1955/12/15, MRN [id number]  PCP:  Lenora Boys, MD   History of Present Illness: Kristie Owens is a 61 y.o. female with coronary artery disease status post heart catheterization in December of 2012 demonstrating widely patent LAD and RCA stent, mild irregularity of the ostium of the diagonal were PTCA was felt to be high risk, here for followup.   She called [place] on 07/22/15 complaining of chest pain/tightness, fatigue, shortness of breath with activity. She been doing a lot of yard work over the past 3 weeks and doesn't know if her symptoms were related to her heart no amount of rest seems to resolve symptoms. Pressure goes up into her throat about 95% of the time. No other radiation. She is worried about this. Her hands and ankles seem to be swelling as well over the past 2-3 weeks especially after hot yard work. She's been short of breath off and on as well.  Previous visit she had described nonexertional, fleeting chest discomfort different from her prior symptoms. We went over her last cardiac catheterization in December of 2012. LAD stent was patent. Diagonal ostial stenosis would be high risk.  Her stress test in 2016 was reassuring. No signs of ischemia. Normal function. However, a repeat stress test in 2017 did show potentially a mild area of anterior septal/apical ischemia. Still continue to be fatigue,  sleepiness, lack of energy. Decreasing carvedilol 6.25 mg twice a day.   I increased her isosorbide at previous visit. maximal dosing.   Married in 11/14. Palpitations, lack of endurance at times.   [name] is sister, colon cancer. Works at [employer]. One of her coworkers had a small MI.  05/30/16-she continues to have increasing anginal symptoms, decreasing endurance, not feeling well. She is concerned about progression of coronary  artery disease. Nuclear stress test performed in 2017 demonstrated anteroseptal/apical possible ischemia. We were treating this to potential breast artifact and continued with aggressive medical therapy area she still continues to have symptoms despite this. She had the flu in January and did not recover as quickly as she wanted. Positive for chest pain, mild shortness of breath. Decreased energy.  Wt Readings from Last 3 Encounters:  05/30/16 189 lb 14.4 oz (86.1 kg)  07/26/15 179 lb (81.2 kg)  07/25/15 179 lb 6.4 oz (81.4 kg)     Past Medical History:  Diagnosis Date  . CAD (coronary artery disease), native coronary artery    DES to LAD in 2 areas, one at the bifurcation of a diagonal branch and also in the mid to distal region. Her diagonal branch was also ballooned . She has a DES to RCA 10/10. Her circumflex artery mild ostial disease.  [name] Hx of cardiovascular stress test    ETT- Myoview (2/16):  No ischemia, EF 69%, normal study    Past Surgical History:  Procedure Laterality Date  . biospy breast    . CAROTID STENT    . CESAREAN SECTION    . FOOT SURGERY    . LEFT HEART CATHETERIZATION WITH CORONARY ANGIOGRAM N/A 01/16/2011   Procedure: LEFT HEART CATHETERIZATION WITH CORONARY ANGIOGRAM;  Surgeon: Donato Schultz, MD;  Location: [facility] CATH LAB;  Service: Cardiovascular;  Laterality: N/A;  . NOSE SURGERY      Current Outpatient Prescriptions  Medication Sig Dispense Refill  .  aspirin EC 81 MG tablet Take 81 mg by mouth daily.      . Biotin 65784 MCG TABS Take 10,000 mg by mouth daily.    . carvedilol (COREG) 12.5 MG tablet Take 6.25 mg by mouth 2 (two) times daily with a meal.    . cetirizine (ZYRTEC) 10 MG tablet Take 10 mg by mouth daily as needed. For allergies.    . fish oil-omega-3 fatty acids 1000 MG capsule Take 4 g by mouth at bedtime.     . fluticasone (FLONASE) 50 MCG/ACT nasal spray Place 2 sprays into both nostrils as needed for allergies or rhinitis.   11  . isosorbide  mononitrate (IMDUR) 120 MG 24 hr tablet TAKE 1 TABLET BY MOUTH EVERY DAY 90 tablet 2  . lisinopril-hydrochlorothiazide (PRINZIDE,ZESTORETIC) 20-12.5 MG tablet TAKE 1 TABLET BY MOUTH DAILY. 90 tablet 1  . Multiple Vitamin (MULTIVITAMIN) capsule Take 1 capsule by mouth daily.    . nitroGLYCERIN (NITROSTAT) 0.4 MG SL tablet Place 1 tablet (0.4 mg total) under the tongue every 5 (five) minutes as needed for chest pain. 25 tablet 3  . rosuvastatin (CRESTOR) 10 MG tablet TAKE 1 TABLET BY MOUTH EVERY DAY 90 tablet 3  . sertraline (ZOLOFT) 50 MG tablet Take 50 mg by mouth daily.    [name] zolpidem (AMBIEN) 10 MG tablet Take 10 mg by mouth at bedtime as needed for sleep.   2   No current facility-administered medications for this visit.     Allergies:   No Known Allergies  Social History:  The patient  reports that she has never smoked. She has never used smokeless tobacco. She reports that she drinks alcohol.   ROS:  Please see the history of present illness. Unless stated above all other review of systems negative.  PHYSICAL EXAM: VS:  BP 128/68   Pulse 69   Ht  (1.626 m)   Wt 189 lb 14.4 oz (86.1 kg)   BMI 32.60 kg/m  GEN: Well nourished, well developed, in no acute distress , mildly diaphoretic HEENT: normal  Neck: no JVD, carotid bruits, or masses Cardiac: RRR; no murmurs, rubs, or gallops,no edema  Respiratory:  clear to auscultation bilaterally, normal work of breathing GI: soft, nontender, nondistended, + BS MS: no deformity or atrophy  Skin: warm and dry, no rash Neuro:  Alert and Oriented x 3, Strength and sensation are intact Psych: euthymic mood, full affect   EKG:  EKG was ordered today 05/30/16-sinus rhythm 69 with no other abnormalities personally viewed-prior 07/25/15- sinus rhythm, 75, no other abnormalities. 05/13/13-sinus rhythm rate 85 with no other abnormalities     NUC 07/26/15  Nuclear stress EF: 72%.  No T wave inversion was noted during stress.  There was no ST  segment deviation noted during stress.  Defect 1: There is a medium defect of moderate severity.  Findings consistent with ischemia.  This is an intermediate risk study.   Small to moderate sized and intensity reversible anterior/anteroapical wall perfusion defect, suggestive of ischemia or possible shifting breast artifact. Target HR was not achieved with exercise, suggesting chronotropic incompetence - lexiscan was administered. LVEF 72% with normal wall motion. This is an intermediate risk study. Clinical correlation is advised.  ASSESSMENT AND PLAN:   Coronary artery disease/angina  - Prior stent to LAD widely patent on cardiac catheterization 01/16/11.  - Nuclear stress test 03/2014 showed no ischemia. However stress test from 07/26/15 demonstrated a small to moderate-sized intensity reversible anterior/apical wall perfusion defect  suggestive of ischemia or perhaps shifting breast artifact. EF was 72%.  - Continuing with medical therapy, antianginal therapy.  - Still feels worse. Trouble exercise. CP in the garden. Pain in the back. Neck as well. No endurance. Worried about for angina.  - Given her recent abnormality on the stress test and continued increased symptoms, continued decreased endurance, we will proceed with cardiac catheterization radial approach. Risks and benefits explained including stroke, heart attack, death. She is willing to proceed.  Essential hypertension  - currently well controlled on medications reviewed as above.  Hyperlipidemia  - Crestor. Doing well.  Fatigue has been a chronic symptom. At least, if we can reassure her that her coronary anatomy remains patent, we continue to encourage conditioning, exercise to help her increase her stamina.  We will see her back post catheterization.  Signed, Donato SchultzMark Lamont Tant, MD [facility]  05/30/2016 10:07 AM

## 2016-05-30 NOTE — Progress Notes (Signed)
L heart catheterization scheduled 06/04/16 at 1030 with Dr. Katrinka Blazing.  Instructions reviewed with patient. Pre-procedure labs to be drawn today.

## 2016-06-04 ENCOUNTER — Encounter (HOSPITAL_COMMUNITY): Payer: Self-pay | Admitting: *Deleted

## 2016-06-04 ENCOUNTER — Encounter (HOSPITAL_COMMUNITY)
Admission: AD | Disposition: A | Discharge status: Home / Self Care | Payer: Self-pay | Source: Ambulatory Visit | Attending: Interventional Cardiology

## 2016-06-04 ENCOUNTER — Ambulatory Visit (HOSPITAL_COMMUNITY)
Admission: AD | Admit: 2016-06-04 | Discharge: 2016-06-04 | Disposition: A | Discharge status: Home / Self Care | Payer: Managed Care, Other (non HMO) | Source: Ambulatory Visit | Attending: Interventional Cardiology | Admitting: Interventional Cardiology

## 2016-06-04 DIAGNOSIS — Z955 Presence of coronary angioplasty implant and graft: Secondary | ICD-10-CM | POA: Insufficient documentation

## 2016-06-04 DIAGNOSIS — I10 Essential (primary) hypertension: Secondary | ICD-10-CM | POA: Diagnosis not present

## 2016-06-04 DIAGNOSIS — R079 Chest pain, unspecified: Secondary | ICD-10-CM | POA: Insufficient documentation

## 2016-06-04 DIAGNOSIS — E782 Mixed hyperlipidemia: Secondary | ICD-10-CM | POA: Diagnosis present

## 2016-06-04 DIAGNOSIS — I2510 Atherosclerotic heart disease of native coronary artery without angina pectoris: Secondary | ICD-10-CM | POA: Diagnosis present

## 2016-06-04 DIAGNOSIS — E785 Hyperlipidemia, unspecified: Secondary | ICD-10-CM | POA: Diagnosis not present

## 2016-06-04 DIAGNOSIS — Z7951 Long term (current) use of inhaled steroids: Secondary | ICD-10-CM | POA: Diagnosis not present

## 2016-06-04 DIAGNOSIS — I208 Other forms of angina pectoris: Secondary | ICD-10-CM | POA: Diagnosis present

## 2016-06-04 DIAGNOSIS — Z7982 Long term (current) use of aspirin: Secondary | ICD-10-CM | POA: Diagnosis not present

## 2016-06-04 DIAGNOSIS — I2089 Other forms of angina pectoris: Secondary | ICD-10-CM | POA: Diagnosis present

## 2016-06-04 HISTORY — PX: LEFT HEART CATH AND CORONARY ANGIOGRAPHY: CATH118249

## 2016-06-04 SURGERY — LEFT HEART CATH AND CORONARY ANGIOGRAPHY
Anesthesia: LOCAL

## 2016-06-04 MED ORDER — SODIUM CHLORIDE 0.9 % IV SOLN: 250.0000 mL | INTRAVENOUS | Status: DC | PRN

## 2016-06-04 MED ORDER — SODIUM CHLORIDE 0.9 % IV SOLN: INTRAVENOUS | Status: DC

## 2016-06-04 MED ORDER — ONDANSETRON HCL 4 MG/2ML IJ SOLN: 4.0000 mg | Freq: Four times a day (QID) | INTRAMUSCULAR | Status: DC | PRN

## 2016-06-04 MED ORDER — SODIUM CHLORIDE 0.9% FLUSH: 3.0000 mL | Freq: Two times a day (BID) | INTRAVENOUS | Status: DC

## 2016-06-04 MED ORDER — HEPARIN (PORCINE) IN NACL 2-0.9 UNIT/ML-% IJ SOLN
INTRAMUSCULAR | Status: DC | PRN
  Administered 2016-06-04: 10 mL via INTRA_ARTERIAL

## 2016-06-04 MED ORDER — VERAPAMIL HCL 2.5 MG/ML IV SOLN
INTRAVENOUS | Status: AC
Start: 2016-06-04 — End: 2016-06-04
  Filled 2016-06-04: qty 2

## 2016-06-04 MED ORDER — HEPARIN SODIUM (PORCINE) 1000 UNIT/ML IJ SOLN
INTRAMUSCULAR | Status: DC | PRN
  Administered 2016-06-04: 4500 [IU] via INTRAVENOUS

## 2016-06-04 MED ORDER — HEPARIN (PORCINE) IN NACL 2-0.9 UNIT/ML-% IJ SOLN
INTRAMUSCULAR | Status: AC
  Filled 2016-06-04: qty 1000

## 2016-06-04 MED ORDER — SODIUM CHLORIDE 0.9% FLUSH: 3.0000 mL | INTRAVENOUS | Status: DC | PRN

## 2016-06-04 MED ORDER — HEPARIN SODIUM (PORCINE) 1000 UNIT/ML IJ SOLN
INTRAMUSCULAR | Status: AC
Start: 2016-06-04 — End: 2016-06-04
  Filled 2016-06-04: qty 1

## 2016-06-04 MED ORDER — SODIUM CHLORIDE 0.9 % WEIGHT BASED INFUSION
3.0000 mL/kg/h | INTRAVENOUS | Status: DC
  Administered 2016-06-04: 3 mL/kg/h via INTRAVENOUS

## 2016-06-04 MED ORDER — IOPAMIDOL (ISOVUE-370) INJECTION 76%
INTRAVENOUS | Status: DC | PRN
  Administered 2016-06-04: 80 mL via INTRA_ARTERIAL

## 2016-06-04 MED ORDER — MIDAZOLAM HCL 2 MG/2ML IJ SOLN
INTRAMUSCULAR | Status: DC | PRN
  Administered 2016-06-04 (×2): 1 mg via INTRAVENOUS

## 2016-06-04 MED ORDER — FENTANYL CITRATE (PF) 100 MCG/2ML IJ SOLN
INTRAMUSCULAR | Status: DC | PRN
  Administered 2016-06-04: 50 ug via INTRAVENOUS

## 2016-06-04 MED ORDER — MIDAZOLAM HCL 2 MG/2ML IJ SOLN
INTRAMUSCULAR | Status: AC
  Filled 2016-06-04: qty 2

## 2016-06-04 MED ORDER — SODIUM CHLORIDE 0.9 % WEIGHT BASED INFUSION: 1.0000 mL/kg/h | INTRAVENOUS | Status: DC

## 2016-06-04 MED ORDER — LIDOCAINE HCL (PF) 1 % IJ SOLN
INTRAMUSCULAR | Status: AC
Start: 2016-06-04 — End: 2016-06-04
  Filled 2016-06-04: qty 30

## 2016-06-04 MED ORDER — HEPARIN (PORCINE) IN NACL 2-0.9 UNIT/ML-% IJ SOLN
INTRAMUSCULAR | Status: DC | PRN
Start: 2016-06-04 — End: 2016-06-04
  Administered 2016-06-04: 1000 mL

## 2016-06-04 MED ORDER — LIDOCAINE HCL (PF) 1 % IJ SOLN
INTRAMUSCULAR | Status: DC | PRN
  Administered 2016-06-04: 2 mL

## 2016-06-04 MED ORDER — IOPAMIDOL (ISOVUE-370) INJECTION 76%
INTRAVENOUS | Status: AC
  Filled 2016-06-04: qty 100

## 2016-06-04 MED ORDER — ACETAMINOPHEN 325 MG PO TABS: 650.0000 mg | ORAL_TABLET | ORAL | Status: DC | PRN

## 2016-06-04 MED ORDER — ASPIRIN 81 MG PO CHEW
81.0000 mg | CHEWABLE_TABLET | ORAL | Status: DC
Start: 2016-06-04 — End: 2016-06-04

## 2016-06-04 MED ORDER — FENTANYL CITRATE (PF) 100 MCG/2ML IJ SOLN
INTRAMUSCULAR | Status: AC
  Filled 2016-06-04: qty 2

## 2016-06-04 SURGICAL SUPPLY — 10 items
CATH INFINITI 5 FR JL3.5 (CATHETERS) ×1 IMPLANT
CATH INFINITI JR4 5F (CATHETERS) ×1 IMPLANT
DEVICE RAD COMP TR BAND LRG (VASCULAR PRODUCTS) ×1 IMPLANT
GLIDESHEATH SLEND A-KIT 6F 22G (SHEATH) ×1 IMPLANT
GUIDEWIRE INQWIRE 1.5J.035X260 (WIRE) IMPLANT
INQWIRE 1.5J .035X260CM (WIRE) ×2
KIT HEART LEFT (KITS) ×2 IMPLANT
PACK CARDIAC CATHETERIZATION (CUSTOM PROCEDURE TRAY) ×2 IMPLANT
TRANSDUCER W/STOPCOCK (MISCELLANEOUS) ×2 IMPLANT
TUBING CIL FLEX 10 FLL-RA (TUBING) ×2 IMPLANT

## 2016-06-04 NOTE — H&P (View-Only) (Signed)
    [address] [place], [place]  [address] Phone: [phone] Fax:  [phone]  Date:  05/30/2016   ID:  Kristie Owens, DOB 09/06/1955, MRN [id number]  PCP:  FRIED, ROBERT L, MD   History of Present Illness: Kristie Owens is a 60 y.o. female with coronary artery disease status post heart catheterization in December of 2012 demonstrating widely patent LAD and RCA stent, mild irregularity of the ostium of the diagonal were PTCA was felt to be high risk, here for followup.   She called us on 07/22/15 complaining of chest pain/tightness, fatigue, shortness of breath with activity. She been doing a lot of yard work over the past 3 weeks and doesn't know if her symptoms were related to her heart no amount of rest seems to resolve symptoms. Pressure goes up into her throat about 95% of the time. No other radiation. She is worried about this. Her hands and ankles seem to be swelling as well over the past 2-3 weeks especially after hot yard work. She's been short of breath off and on as well.  Previous visit she had described nonexertional, fleeting chest discomfort different from her prior symptoms. We went over her last cardiac catheterization in December of 2012. LAD stent was patent. Diagonal ostial stenosis would be high risk.  Her stress test in 2016 was reassuring. No signs of ischemia. Normal function. However, a repeat stress test in 2017 did show potentially a mild area of anterior septal/apical ischemia. Still continue to be fatigue,  sleepiness, lack of energy. Decreasing carvedilol 6.25 mg twice a day.   I increased her isosorbide at previous visit. maximal dosing.   Married in 11/14. Palpitations, lack of endurance at times.   Kristie Owens is sister, colon cancer. Works at [employer]. One of her coworkers had a small MI.  05/30/16-she continues to have increasing anginal symptoms, decreasing endurance, not feeling well. She is concerned about progression of coronary  artery disease. Nuclear stress test performed in 2017 demonstrated anteroseptal/apical possible ischemia. We were treating this to potential breast artifact and continued with aggressive medical therapy area she still continues to have symptoms despite this. She had the flu in January and did not recover as quickly as she wanted. Positive for chest pain, mild shortness of breath. Decreased energy.  Wt Readings from Last 3 Encounters:  05/30/16 189 lb 14.4 oz (86.1 kg)  07/26/15 179 lb (81.2 kg)  07/25/15 179 lb 6.4 oz (81.4 kg)     Past Medical History:  Diagnosis Date  . CAD (coronary artery disease), native coronary artery    DES to LAD in 2 areas, one at the bifurcation of a diagonal branch and also in the mid to distal region. Her diagonal branch was also ballooned . She has a DES to RCA 10/10. Her circumflex artery mild ostial disease.  . Hx of cardiovascular stress test    ETT- Myoview (2/16):  No ischemia, EF 69%, normal study    Past Surgical History:  Procedure Laterality Date  . biospy breast    . CAROTID STENT    . CESAREAN SECTION    . FOOT SURGERY    . LEFT HEART CATHETERIZATION WITH CORONARY ANGIOGRAM N/A 01/16/2011   Procedure: LEFT HEART CATHETERIZATION WITH CORONARY ANGIOGRAM;  Surgeon: Charon Smedberg, MD;  Location: [facility] CATH [facility];  Service: Cardiovascular;  Laterality: N/A;  . NOSE SURGERY      Current Outpatient Prescriptions  Medication Sig Dispense Refill  .   aspirin EC 81 MG tablet Take 81 mg by mouth daily.      . Biotin 65784 MCG TABS Take 10,000 mg by mouth daily.    . carvedilol (COREG) 12.5 MG tablet Take 6.25 mg by mouth 2 (two) times daily with a meal.    . cetirizine (ZYRTEC) 10 MG tablet Take 10 mg by mouth daily as needed. For allergies.    . fish oil-omega-3 fatty acids 1000 MG capsule Take 4 g by mouth at bedtime.     . fluticasone (FLONASE) 50 MCG/ACT nasal spray Place 2 sprays into both nostrils as needed for allergies or rhinitis.   11  . isosorbide  mononitrate (IMDUR) 120 MG 24 hr tablet TAKE 1 TABLET BY MOUTH EVERY DAY 90 tablet 2  . lisinopril-hydrochlorothiazide (PRINZIDE,ZESTORETIC) 20-12.5 MG tablet TAKE 1 TABLET BY MOUTH DAILY. 90 tablet 1  . Multiple Vitamin (MULTIVITAMIN) capsule Take 1 capsule by mouth daily.    . nitroGLYCERIN (NITROSTAT) 0.4 MG SL tablet Place 1 tablet (0.4 mg total) under the tongue every 5 (five) minutes as needed for chest pain. 25 tablet 3  . rosuvastatin (CRESTOR) 10 MG tablet TAKE 1 TABLET BY MOUTH EVERY DAY 90 tablet 3  . sertraline (ZOLOFT) 50 MG tablet Take 50 mg by mouth daily.    [name] zolpidem (AMBIEN) 10 MG tablet Take 10 mg by mouth at bedtime as needed for sleep.   2   No current facility-administered medications for this visit.     Allergies:   No Known Allergies  Social History:  The patient  reports that she has never smoked. She has never used smokeless tobacco. She reports that she drinks alcohol.   ROS:  Please see the history of present illness. Unless stated above all other review of systems negative.  PHYSICAL EXAM: VS:  BP 128/68   Pulse 69   Ht  (1.626 m)   Wt 189 lb 14.4 oz (86.1 kg)   BMI 32.60 kg/m  GEN: Well nourished, well developed, in no acute distress , mildly diaphoretic HEENT: normal  Neck: no JVD, carotid bruits, or masses Cardiac: RRR; no murmurs, rubs, or gallops,no edema  Respiratory:  clear to auscultation bilaterally, normal work of breathing GI: soft, nontender, nondistended, + BS MS: no deformity or atrophy  Skin: warm and dry, no rash Neuro:  Alert and Oriented x 3, Strength and sensation are intact Psych: euthymic mood, full affect   EKG:  EKG was ordered today 05/30/16-sinus rhythm 69 with no other abnormalities personally viewed-prior 07/25/15- sinus rhythm, 75, no other abnormalities. 05/13/13-sinus rhythm rate 85 with no other abnormalities     NUC 07/26/15  Nuclear stress EF: 72%.  No T wave inversion was noted during stress.  There was no ST  segment deviation noted during stress.  Defect 1: There is a medium defect of moderate severity.  Findings consistent with ischemia.  This is an intermediate risk study.   Small to moderate sized and intensity reversible anterior/anteroapical wall perfusion defect, suggestive of ischemia or possible shifting breast artifact. Target HR was not achieved with exercise, suggesting chronotropic incompetence - lexiscan was administered. LVEF 72% with normal wall motion. This is an intermediate risk study. Clinical correlation is advised.  ASSESSMENT AND PLAN:   Coronary artery disease/angina  - Prior stent to LAD widely patent on cardiac catheterization 01/16/11.  - Nuclear stress test 03/2014 showed no ischemia. However stress test from 07/26/15 demonstrated a small to moderate-sized intensity reversible anterior/apical wall perfusion defect  suggestive of ischemia or perhaps shifting breast artifact. EF was 72%.  - Continuing with medical therapy, antianginal therapy.  - Still feels worse. Trouble exercise. CP in the garden. Pain in the back. Neck as well. No endurance. Worried about for angina.  - Given her recent abnormality on the stress test and continued increased symptoms, continued decreased endurance, we will proceed with cardiac catheterization radial approach. Risks and benefits explained including stroke, heart attack, death. She is willing to proceed.  Essential hypertension  - currently well controlled on medications reviewed as above.  Hyperlipidemia  - Crestor. Doing well.  Fatigue has been a chronic symptom. At least, if we can reassure her that her coronary anatomy remains patent, we continue to encourage conditioning, exercise to help her increase her stamina.  We will see her back post catheterization.  Signed, Donato SchultzMark Adalay Azucena, MD [facility]  05/30/2016 10:07 AM

## 2016-06-04 NOTE — Interval H&P Note (Signed)
Cath Lab Visit (complete for each Cath Lab visit)  Clinical Evaluation Leading to the Procedure:   ACS: No.  Non-ACS:    Anginal Classification: CCS III  Anti-ischemic medical therapy: Maximal Therapy (2 or more classes of medications)  Non-Invasive Test Results: Intermediate-risk stress test findings: cardiac mortality 1-3%/year  Prior CABG: No previous CABG      History and Physical Interval Note:  06/04/2016 9:36 AM  Kristie Owens  has presented today for surgery, with the diagnosis of cp  The various methods of treatment have been discussed with the patient and family. After consideration of risks, benefits and other options for treatment, the patient has consented to  Procedure(s): Left Heart Cath and Coronary Angiography (N/A) as a surgical intervention .  The patient's history has been reviewed, patient examined, no change in status, stable for surgery.  I have reviewed the patient's chart and labs.  Questions were answered to the patient's satisfaction.     Lyn RecordsHenry W Tationna Fullard III

## 2016-06-04 NOTE — Discharge Instructions (Signed)
Radial Site Care °Refer to this sheet in the next few weeks. These instructions provide you with information about caring for yourself after your procedure. Your health care provider may also give you more specific instructions. Your treatment has been planned according to current medical practices, but problems sometimes occur. Call your health care provider if you have any problems or questions after your procedure. °What can I expect after the procedure? °After your procedure, it is typical to have the following: °· Bruising at the radial site that usually fades within 1-2 weeks. °· Blood collecting in the tissue (hematoma) that may be painful to the touch. It should usually decrease in size and tenderness within 1-2 weeks. °Follow these instructions at home: °· Take medicines only as directed by your health care provider. °· You may shower 24-48 hours after the procedure or as directed by your health care provider. Remove the bandage (dressing) and gently wash the site with plain soap and water. Pat the area dry with a clean towel. Do not rub the site, because this may cause bleeding. °· Do not take baths, swim, or use a hot tub until your health care provider approves. °· Check your insertion site every day for redness, swelling, or drainage. °· Do not apply powder or lotion to the site. °· Do not flex or bend the affected arm for 24 hours or as directed by your health care provider. °· Do not push or pull heavy objects with the affected arm for 24 hours or as directed by your health care provider. °· Do not lift over 10 lb (4.5 kg) for 5 days after your procedure or as directed by your health care provider. °· Ask your health care provider when it is okay to: °¨ Return to work or school. °¨ Resume usual physical activities or sports. °¨ Resume sexual activity. °· Do not drive home if you are discharged the same day as the procedure. Have someone else drive you. °· You may drive 24 hours after the procedure  unless otherwise instructed by your health care provider. °· Do not operate machinery or power tools for 24 hours after the procedure. °· If your procedure was done as an outpatient procedure, which means that you went home the same day as your procedure, a responsible adult should be with you for the first 24 hours after you arrive home. °· Keep all follow-up visits as directed by your health care provider. This is important. °Contact a health care provider if: °· You have a fever. °· You have chills. °· You have increased bleeding from the radial site. Hold pressure on the site. °Get help right away if: °· You have unusual pain at the radial site. °· You have redness, warmth, or swelling at the radial site. °· You have drainage (other than a small amount of blood on the dressing) from the radial site. °· The radial site is bleeding, and the bleeding does not stop after 30 minutes of holding steady pressure on the site. °· Your arm or hand becomes pale, cool, tingly, or numb. °This information is not intended to replace advice given to you by your health care provider. Make sure you discuss any questions you have with your health care provider. °Document Released: 02/17/2010 Document Revised: 06/23/2015 Document Reviewed: 08/03/2013 °Elsevier Interactive Patient Education © 2017 [employer] ° °

## 2016-06-07 ENCOUNTER — Encounter (HOSPITAL_COMMUNITY): Payer: Self-pay | Admitting: Interventional Cardiology

## 2016-08-16 ENCOUNTER — Other Ambulatory Visit: Payer: Self-pay | Admitting: Cardiology

## 2016-08-24 ENCOUNTER — Telehealth: Payer: Self-pay | Admitting: Cardiology

## 2016-08-24 MED ORDER — LISINOPRIL-HYDROCHLOROTHIAZIDE 20-12.5 MG PO TABS: 1.0000 | ORAL_TABLET | Freq: Every day | ORAL | 2 refills | Status: DC

## 2016-08-24 MED ORDER — ROSUVASTATIN CALCIUM 10 MG PO TABS
10.0000 mg | ORAL_TABLET | Freq: Every day | ORAL | 2 refills | Status: DC
Start: 2016-08-24 — End: 2017-04-29

## 2016-08-24 NOTE — Telephone Encounter (Signed)
Pt's medication was sent to pt's pharmacy as requested. Confirmation received.  °

## 2016-08-24 NOTE — Telephone Encounter (Signed)
°*  STAT* If patient is at the pharmacy, call can be transferred to refill team.   1. Which medications need to be refilled? (please list name of each medication and dose if known) lisinopril hydrochlorothiazide 20-12.5 mg and Rosuvastatin 10 mg  2. Which pharmacy/location (including street and city if local pharmacy) is medication to be sent to? [facility] [phone]#[id number] [address] [place], [place], [place]  3. Do they need a 30 day or 90 day supply? 90

## 2016-10-08 ENCOUNTER — Other Ambulatory Visit: Payer: Self-pay | Admitting: Family Medicine

## 2016-10-08 DIAGNOSIS — Z1231 Encounter for screening mammogram for malignant neoplasm of breast: Secondary | ICD-10-CM

## 2016-11-11 ENCOUNTER — Other Ambulatory Visit: Payer: Self-pay | Admitting: Cardiology

## 2017-04-09 ENCOUNTER — Other Ambulatory Visit: Payer: Self-pay | Admitting: Cardiology

## 2017-04-18 ENCOUNTER — Encounter: Payer: Self-pay | Admitting: *Deleted

## 2017-04-29 ENCOUNTER — Ambulatory Visit (INDEPENDENT_AMBULATORY_CARE_PROVIDER_SITE_OTHER): Payer: Managed Care, Other (non HMO) | Admitting: Cardiology

## 2017-04-29 ENCOUNTER — Encounter: Payer: Self-pay | Admitting: Cardiology

## 2017-04-29 VITALS — BP 118/68 | HR 71 | Ht 64.0 in | Wt 192.8 lb

## 2017-04-29 DIAGNOSIS — I208 Other forms of angina pectoris: Secondary | ICD-10-CM

## 2017-04-29 DIAGNOSIS — I10 Essential (primary) hypertension: Secondary | ICD-10-CM | POA: Diagnosis not present

## 2017-04-29 DIAGNOSIS — I2510 Atherosclerotic heart disease of native coronary artery without angina pectoris: Secondary | ICD-10-CM | POA: Diagnosis not present

## 2017-04-29 MED ORDER — ISOSORBIDE MONONITRATE ER 60 MG PO TB24: 60.0000 mg | ORAL_TABLET | Freq: Every day | ORAL | Status: DC

## 2017-04-29 NOTE — Progress Notes (Signed)
[address] [place], [place]  [address] Phone: [phone] Fax:  [phone]  Date:  04/29/2017   ID:  Kristie Owens, DOB 11/18/1955, MRN [id number]  PCP:  Lenell Antu, DO   History of Present Illness: Kristie Owens is a 62 y.o. female with coronary artery disease status post heart catheterization in December of 2012 and May 2018 demonstrating widely patent LAD and RCA stent, mild irregularity of the ostium of the diagonal were PTCA was felt to be high risk, here for followup.   She called [place] on 07/22/15 complaining of chest pain/tightness, fatigue, shortness of breath with activity. She been doing a lot of yard work over the past 3 weeks and doesn't know if her symptoms were related to her heart no amount of rest seems to resolve symptoms. Pressure goes up into her throat about 95% of the time. No other radiation. She is worried about this. Her hands and ankles seem to be swelling as well over the past 2-3 weeks especially after hot yard work. She's been short of breath off and on as well.  Previous visit she had described nonexertional, fleeting chest discomfort different from her prior symptoms. We went over her last cardiac catheterization in December of 2012. LAD stent was patent. Diagonal ostial stenosis would be high risk.  Her stress test in 2016 was reassuring. No signs of ischemia. Normal function. However, a repeat stress test in 2017 did show potentially a mild area of anterior septal/apical ischemia. Still continue to be fatigue,  sleepiness, lack of energy. Decreasing carvedilol 6.25 mg twice a day.   I increased her isosorbide at previous visit. Maximal dosing.   Married in 11/14. Palpitations, lack of endurance at times.   [name] is sister, colon cancer. Works at [employer]. One of her coworkers had a small MI.  05/30/16-she continues to have increasing anginal symptoms, decreasing endurance, not feeling well. She is concerned about progression of  coronary artery disease. Nuclear stress test performed in 2017 demonstrated anteroseptal/apical possible ischemia. We were treating this to potential breast artifact and continued with aggressive medical therapy area she still continues to have symptoms despite this. She had the flu in January and did not recover as quickly as she wanted. Positive for chest pain, mild shortness of breath. Decreased energy.  04/29/17-reassuring cardiac catheterization.  No changes in May 2018.  She still continues to have the same symptoms.  We are trying decreased isosorbide to 60. Positive chest discomfort and windedness at times.  Sometimes has trouble getting up stairs.  No syncope, no bleeding.  Wt Readings from Last 3 Encounters:  04/29/17 192 lb 12.8 oz (87.5 kg)  06/04/16 189 lb (85.7 kg)  05/30/16 189 lb 14.4 oz (86.1 kg)     Past Medical History:  Diagnosis Date  . CAD (coronary artery disease), native coronary artery    DES to LAD in 2 areas, one at the bifurcation of a diagonal branch and also in the mid to distal region. Her diagonal branch was also ballooned . She has a DES to RCA 10/10. Her circumflex artery mild ostial disease.  [name] Hx of cardiovascular stress test    ETT- Myoview (2/16):  No ischemia, EF 69%, normal study    Past Surgical History:  Procedure Laterality Date  . biospy breast    . CAROTID STENT    . CESAREAN SECTION    . FOOT SURGERY    . LEFT HEART  CATH AND CORONARY ANGIOGRAPHY N/A 06/04/2016   Procedure: Left Heart Cath and Coronary Angiography;  Surgeon: Lyn Records, MD;  Location: [facility] INVASIVE CV LAB;  Service: Cardiovascular;  Laterality: N/A;  . LEFT HEART CATHETERIZATION WITH CORONARY ANGIOGRAM N/A 01/16/2011   Procedure: LEFT HEART CATHETERIZATION WITH CORONARY ANGIOGRAM;  Surgeon: Donato Schultz, MD;  Location: [facility] CATH LAB;  Service: Cardiovascular;  Laterality: N/A;  . NOSE SURGERY      Current Outpatient Medications  Medication Sig Dispense Refill  . aspirin EC 81  MG tablet Take 81 mg by mouth daily.      . Biotin [address] MCG TABS Take 10,000 mg by mouth at bedtime.     . carvedilol (COREG) 12.5 MG tablet TAKE 1/2 TABLET BY MOUTH 2 TIMES DAILY WITH A MEAL 30 tablet 1  . cetirizine (ZYRTEC) 10 MG tablet Take 10 mg by mouth daily as needed for allergies.     [name] doxycycline (VIBRA-TABS) 100 MG tablet Take 1 tablet by mouth 2 (two) times daily.  0  . fluticasone (FLONASE) 50 MCG/ACT nasal spray Place 2 sprays into both nostrils 2 (two) times daily as needed for allergies or rhinitis.   11  . isosorbide mononitrate (IMDUR) 60 MG 24 hr tablet Take 1 tablet (60 mg total) by mouth daily. Please call and schedule a six month follow up appointment per last office visit    . lisinopril-hydrochlorothiazide (PRINZIDE,ZESTORETIC) 20-12.5 MG tablet Take 1 tablet by mouth daily. 90 tablet 2  . metroNIDAZOLE (METROGEL) 0.75 % gel Apply 1 application topically 2 (two) times daily.    . nitroGLYCERIN (NITROSTAT) 0.4 MG SL tablet Place 1 tablet (0.4 mg total) under the tongue every 5 (five) minutes as needed for chest pain. 25 tablet 3  . Polyethyl Glycol-Propyl Glycol (SYSTANE OP) Apply 1 drop to eye daily as needed (dry eyes).    . sertraline (ZOLOFT) 50 MG tablet Take 50 mg by mouth at bedtime.     [name] zolpidem (AMBIEN) 10 MG tablet Take 10 mg by mouth at bedtime.   2   No current facility-administered medications for this visit.     Allergies:   No Known Allergies  Social History:  The patient  reports that she has never smoked. She has never used smokeless tobacco. She reports that she drinks alcohol.   ROS:  Please see the history of present illness. All other ROS negative.  PHYSICAL EXAM: VS:  BP 118/68   Pulse 71   Ht 5\' 4"  (1.626 m)   Wt 192 lb 12.8 oz (87.5 kg)   BMI 33.09 kg/m  GEN: Well nourished, well developed, in no acute distress, overweight HEENT: normal  Neck: no JVD, carotid bruits, or masses Cardiac: RRR; no murmurs, rubs, or gallops,no edema    Respiratory:  clear to auscultation bilaterally, normal work of breathing GI: soft, nontender, nondistended, + BS MS: no deformity or atrophy  Skin: warm and dry, no rash Neuro:  Alert and Oriented x 3, Strength and sensation are intact Psych: euthymic mood, full affect    EKG:  EKG was ordered today 05/30/16-sinus rhythm 69 with no other abnormalities personally viewed-prior 07/25/15- sinus rhythm, 75, no other abnormalities. 05/13/13-sinus rhythm rate 85 with no other abnormalities     NUC 07/26/15  Nuclear stress EF: 72%.  No T wave inversion was noted during stress.  There was no ST segment deviation noted during stress.  Defect 1: There is a medium defect of moderate severity.  Findings consistent  with ischemia.  This is an intermediate risk study.   Small to moderate sized and intensity reversible anterior/anteroapical wall perfusion defect, suggestive of ischemia or possible shifting breast artifact. Target HR was not achieved with exercise, suggesting chronotropic incompetence - lexiscan was administered. LVEF 72% with normal wall motion. This is an intermediate risk study. Clinical correlation is advised.  Cardiac catheterization on 06/04/16:  Widely patent proximal and distal LAD stents. 40% ostial narrowing in the first diagonal.  Patent mid right coronary stents. Proximal 40% RCA eccentric narrowing.  Circumflex is a small vessel with 40-50% first obtuse marginal.  Ramus intermedius (or first obtuse marginal depending upon categorization) contains 40% proximal narrowing.  Normal LV function. Elevated LVEDP at 23 mmHg. EF 55%.  RECOMMENDATIONS:   When compared to the prior study, no significant changes have occurred since 2012. Continuous chest discomfort is not likely be related to ischemic heart disease.  ASSESSMENT AND PLAN:   Coronary artery disease/angina  - Prior stent to LAD widely patent on cardiac catheterization 01/16/11 and 06/04/16, overall  reassuring.  Continuous chest discomfort not likely to be related to ischemic heart disease.  - Continuing with medical therapy, antianginal therapy.  We will however decrease her isosorbide from 120-60.  Another option in the future is to try Ranexa.  I want her to continue to work on conditioning however walking.   Essential hypertension  -Excellent control.  Isosorbide being decreased to 60.[name].  Hyperlipidemia  - Crestor. Doing well.  LDL currently 77 in August 2018.  We will see her back in 6 months  Signed, Donato SchultzMark Ethlyn Alto, MD [facility]  04/29/2017 3:33 PM

## 2017-04-29 NOTE — Patient Instructions (Signed)
Medication Instructions:  Please decrease Isosorbide to 60 mg a day. Continue all other medications as listed.  Follow-Up: Follow up in 6 months with Dr. Anne FuSkains.  You will receive a letter in the mail 2 months before you are due.  Please call us when you receive this letter to schedule your follow up appointment.  If you need a refill on your cardiac medications before your next appointment, please call your pharmacy.  Thank you for choosing [facility]!!

## 2017-05-10 ENCOUNTER — Other Ambulatory Visit: Payer: Self-pay | Admitting: Cardiology

## 2017-06-05 ENCOUNTER — Other Ambulatory Visit: Payer: Self-pay | Admitting: Cardiology

## 2017-06-11 ENCOUNTER — Other Ambulatory Visit: Payer: Self-pay | Admitting: Cardiology

## 2017-06-29 ENCOUNTER — Other Ambulatory Visit: Payer: Self-pay | Admitting: Cardiology

## 2017-07-08 ENCOUNTER — Telehealth: Payer: Self-pay | Admitting: Cardiology

## 2017-07-08 MED ORDER — ROSUVASTATIN CALCIUM 10 MG PO TABS: 10.0000 mg | ORAL_TABLET | Freq: Every day | ORAL | 3 refills | Status: DC

## 2017-07-08 NOTE — Telephone Encounter (Signed)
Pt calling and stating she is returning call to nurse.Please call pt.

## 2017-07-08 NOTE — Telephone Encounter (Signed)
    Pt calling and stating she is returning call to nurse.Please call pt.      Documentation     Cornelious BryantBrown, Johnathan D [phone]  Doreatha MassedMoore, Debra W      Spoke with patient who reports is is still taking this medication and has never stopped it.  RX will be sent into pharmacy as requested.

## 2017-07-08 NOTE — Telephone Encounter (Signed)
[facility] [place] [place] a refill on Rosuvastatin 10 mg tablet taken daily. This medication is no longer on pt's medication list. I do not see where Dr. Anne FuSkains D/C this medication. Please address

## 2017-07-08 NOTE — Telephone Encounter (Signed)
erro  neous encounter

## 2017-07-08 NOTE — Telephone Encounter (Signed)
Left message to c/b to discuss Crestor.

## 2017-10-16 ENCOUNTER — Other Ambulatory Visit: Payer: Self-pay | Admitting: Family Medicine

## 2017-10-16 DIAGNOSIS — Z1231 Encounter for screening mammogram for malignant neoplasm of breast: Secondary | ICD-10-CM

## 2017-10-30 ENCOUNTER — Encounter: Payer: Managed Care, Other (non HMO) | Attending: Family Medicine | Admitting: Registered"

## 2017-10-30 ENCOUNTER — Encounter: Payer: Self-pay | Admitting: Registered"

## 2017-10-30 DIAGNOSIS — E119 Type 2 diabetes mellitus without complications: Secondary | ICD-10-CM | POA: Diagnosis not present

## 2017-10-30 DIAGNOSIS — Z713 Dietary counseling and surveillance: Secondary | ICD-10-CM | POA: Insufficient documentation

## 2017-10-30 NOTE — Progress Notes (Signed)
Diabetes Self-Management Education  Visit Type: First/Initial  Appt. Start Time: 0930 Appt. End Time: 1100  11/01/2017  [name], identified by name and date of birth, is a 62 y.o. female with a diagnosis of Diabetes: Type 2.    ASSESSMENT  Height 5\' 4"  (1.626 m), weight 192 lb 3.2 oz (87.2 kg). Body mass index is 32.99 kg/m.   Pt is present for appointment with her sister whom she reports is very supportive. Pt reports having multiple supportive family members. Pt reports she is not currently checking her blood sugar at home but reports she is going to talk with her doctor about maybe starting to do so.   Diabetes Self-Management Education - 10/30/17 0934      Visit Information   Visit Type  First/Initial      Initial Visit   Diabetes Type  Type 2    Are you currently following a meal plan?  Yes    What type of meal plan do you follow?  Self developed per pt.     Are you taking your medications as prescribed?  Yes    Date Diagnosed   September 2019      Health Coping   How would you rate your overall health?  Good      Psychosocial Assessment   Patient Belief/Attitude about Diabetes  Motivated to manage diabetes    Self-care barriers  Unable to determine    Self-management support  Family    Other persons present  Family Member;Patient    Patient Concerns  Nutrition/Meal planning    Special Needs  None    Preferred Learning Style  No preference indicated    Learning Readiness  Ready    How often do you need to have someone help you when you read instructions, pamphlets, or other written materials from your doctor or pharmacy?  1 - Never    What is the last grade level you completed in school?  Some college       Pre-Education Assessment   Patient understands the diabetes disease and treatment process.  Needs Instruction    Patient understands incorporating nutritional management into lifestyle.  Needs Instruction    Patient undertands incorporating physical  activity into lifestyle.  Needs Instruction    Patient understands using medications safely.  Demonstrates understanding / competency    Patient understands monitoring blood glucose, interpreting and using results  Needs Instruction    Patient understands prevention, detection, and treatment of acute complications.  Needs Instruction    Patient understands prevention, detection, and treatment of chronic complications.  Needs Instruction    Patient understands how to develop strategies to address psychosocial issues.  Needs Instruction    Patient understands how to develop strategies to promote health/change behavior.  Needs Instruction      Complications   Last HgB A1C per patient/outside source  6.7 %    How often do you check your blood sugar?  0 times/day (not testing)    Fasting Blood glucose range (mg/dL)  --   Pt is not currently testing blood sugar.    Postprandial Blood glucose range (mg/dL)  --   Pt is not currently testing her blood sugar.    Number of hypoglycemic episodes per month  --   Pt reports increased thirst and urination. Pt has not been testing blood sugar at home.    Number of hyperglycemic episodes per week  --   Pt unsure. Pt has not been testing her blood  sugar at home.    Have you had a dilated eye exam in the past 12 months?  Yes    Have you had a dental exam in the past 12 months?  Yes    Are you checking your feet?  Yes    How many days per week are you checking your feet?  7      Dietary Intake   Breakfast  instant oatmeal-1 pack of plain and 1 pack of raisin, dates, and nuts, 1 cup coffee with cream     Snack (morning)  None reported.     Lunch  spicy chicken sandwich on bun from [employer], lightly sweetened tea    Snack (afternoon)  None reported.     Dinner  1 piece of cheese, 5 chicken wings, lightly sweetened tea, 16 oz water    Snack (evening)  None reported.     Beverage(s)  coffee with creamer; usually around 2 quarts of water per day; lightly  sweetened tea      Exercise   Exercise Type  ADL's    How many days per week to you exercise?  0    How many minutes per day do you exercise?  0    Total minutes per week of exercise  0      Patient Education   Previous Diabetes Education  No    Disease state   Definition of diabetes, type 1 and 2, and the diagnosis of diabetes;Factors that contribute to the development of diabetes    Nutrition management   Role of diet in the treatment of diabetes and the relationship between the three main macronutrients and blood glucose level;Food label reading, portion sizes and measuring food.;Reviewed blood glucose goals for pre and post meals and how to evaluate the patients' food intake on their blood glucose level.;Carbohydrate counting    Physical activity and exercise   Role of exercise on diabetes management, blood pressure control and cardiac health.    Monitoring  Purpose and frequency of SMBG.;Interpreting lab values - A1C, lipid, urine microalbumina.;Yearly dilated eye exam;Daily foot exams;Identified appropriate SMBG and/or A1C goals.;Taught/discussed recording of test results and interpretation of SMBG.    Acute complications  Taught treatment of hypoglycemia - the 15 rule.;Discussed and identified patients' treatment of hyperglycemia.;Covered sick day management with medication and food.    Chronic complications  Relationship between chronic complications and blood glucose control;Assessed and discussed foot care and prevention of foot problems;Dental care;Lipid levels, blood glucose control and heart disease;Retinopathy and reason for yearly dilated eye exams;Reviewed with patient heart disease, higher risk of, and prevention    Psychosocial adjustment  Role of stress on diabetes;Travel strategies      Individualized Goals (developed by patient)   Nutrition  Follow meal plan discussed;General guidelines for healthy choices and portions discussed    Physical Activity  Exercise 3-5 times per  week;Exercise 1-2 times per week    Medications  take my medication as prescribed      Post-Education Assessment   Patient understands the diabetes disease and treatment process.  Demonstrates understanding / competency    Patient understands incorporating nutritional management into lifestyle.  Demonstrates understanding / competency    Patient undertands incorporating physical activity into lifestyle.  Demonstrates understanding / competency    Patient understands using medications safely.  Demonstrates understanding / competency    Patient understands monitoring blood glucose, interpreting and using results  Demonstrates understanding / competency    Patient understands prevention, detection, and treatment  of acute complications.  Demonstrates understanding / competency    Patient understands prevention, detection, and treatment of chronic complications.  Demonstrates understanding / competency    Patient understands how to develop strategies to address psychosocial issues.  Demonstrates understanding / competency    Patient understands how to develop strategies to promote health/change behavior.  Demonstrates understanding / competency      Outcomes   Expected Outcomes  Demonstrated interest in learning. Expect positive outcomes    Future DMSE  PRN    Program Status  Completed       Individualized Plan for Diabetes Self-Management Training:   Learning Objective:  Patient will have a greater understanding of diabetes self-management. Patient education plan is to attend individual and/or group sessions per assessed needs and concerns.   Plan:   Patient Instructions  Instructions/Goals:  Continue including 3 meals per day. If a meal is spaced more than 5 hours apart as well as if you are doing a physical activity and have not eaten in 3 hours or more recommend a balanced snack.   Recommend 2-3 carbohydrate choices per meal (30-45 g carbohydrate).    Include heart healthy  unsaturated fats and limit foods high in saturated fat (those with 20% or more per serving).   Recommend at least 64 oz water daily.   [email or web address]  Make physical activity a part of your week. Try to include at least 30 minutes of physical activity 5 days each week or at least 150 minutes per week. Regular physical activity promotes overall health-including helping to reduce risk for heart disease and diabetes, promoting mental health, and helping [place] sleep better.    Starting Goal: Include at least 30 minutes of physical activity 3 days per week.   Recommend talking with your doctor about checking blood sugar at home for additional information.      Expected Outcomes:  Demonstrated interest in learning. Expect positive outcomes  Education material provided: My Plate, Snack sheet, Support group flyer and Carbohydrate counting sheet, Living Well with Diabetes book  If problems or questions, patient to contact team via:  Phone and My Chart   Future DSME appointment: PRN

## 2017-10-30 NOTE — Patient Instructions (Signed)
Instructions/Goals:  Continue including 3 meals per day. If a meal is spaced more than 5 hours apart as well as if you are doing a physical activity and have not eaten in 3 hours or more recommend a balanced snack.   Recommend 2-3 carbohydrate choices per meal (30-45 g carbohydrate).    Include heart healthy unsaturated fats and limit foods high in saturated fat (those with 20% or more per serving).   Recommend at least 64 oz water daily.   [email or web address]  Make physical activity a part of your week. Try to include at least 30 minutes of physical activity 5 days each week or at least 150 minutes per week. Regular physical activity promotes overall health-including helping to reduce risk for heart disease and diabetes, promoting mental health, and helping [place] sleep better.    Starting Goal: Include at least 30 minutes of physical activity 3 days per week.   Recommend talking with your doctor about checking blood sugar at home for additional information.

## 2017-11-19 ENCOUNTER — Ambulatory Visit
Admission: RE | Admit: 2017-11-19 | Discharge: 2017-11-19 | Disposition: A | Discharge status: Home / Self Care | Payer: Managed Care, Other (non HMO) | Source: Ambulatory Visit | Attending: Family Medicine | Admitting: Family Medicine

## 2017-11-19 DIAGNOSIS — Z1231 Encounter for screening mammogram for malignant neoplasm of breast: Secondary | ICD-10-CM

## 2017-11-20 ENCOUNTER — Other Ambulatory Visit: Payer: Self-pay | Admitting: Family Medicine

## 2017-11-20 DIAGNOSIS — R928 Other abnormal and inconclusive findings on diagnostic imaging of breast: Secondary | ICD-10-CM

## 2017-11-26 ENCOUNTER — Ambulatory Visit
Admission: RE | Admit: 2017-11-26 | Discharge: 2017-11-26 | Disposition: A | Discharge status: Home / Self Care | Payer: Managed Care, Other (non HMO) | Source: Ambulatory Visit | Attending: Family Medicine | Admitting: Family Medicine

## 2017-11-26 DIAGNOSIS — R928 Other abnormal and inconclusive findings on diagnostic imaging of breast: Secondary | ICD-10-CM

## 2018-03-02 ENCOUNTER — Other Ambulatory Visit: Payer: Self-pay | Admitting: Cardiology

## 2018-05-04 ENCOUNTER — Other Ambulatory Visit: Payer: Self-pay | Admitting: Cardiology

## 2018-05-29 ENCOUNTER — Other Ambulatory Visit: Payer: Self-pay | Admitting: Cardiology

## 2018-06-16 ENCOUNTER — Telehealth: Payer: Self-pay | Admitting: Cardiology

## 2018-06-16 ENCOUNTER — Encounter: Payer: Self-pay | Admitting: Cardiology

## 2018-06-16 ENCOUNTER — Other Ambulatory Visit: Payer: Self-pay

## 2018-06-16 ENCOUNTER — Telehealth (INDEPENDENT_AMBULATORY_CARE_PROVIDER_SITE_OTHER): Payer: Managed Care, Other (non HMO) | Admitting: Cardiology

## 2018-06-16 VITALS — Ht 64.0 in | Wt 192.0 lb

## 2018-06-16 DIAGNOSIS — I10 Essential (primary) hypertension: Secondary | ICD-10-CM

## 2018-06-16 DIAGNOSIS — I2510 Atherosclerotic heart disease of native coronary artery without angina pectoris: Secondary | ICD-10-CM | POA: Diagnosis not present

## 2018-06-16 DIAGNOSIS — I208 Other forms of angina pectoris: Secondary | ICD-10-CM

## 2018-06-16 NOTE — Progress Notes (Signed)
Virtual Visit via Video Note   This visit type was conducted due to national recommendations for restrictions regarding the COVID-19 Pandemic (e.g. social distancing) in an effort to limit this patient's exposure and mitigate transmission in our community.  Due to her co-morbid illnesses, this patient is at least at moderate risk for complications without adequate follow up.  This format is felt to be most appropriate for this patient at this time.  All issues noted in this document were discussed and addressed.  A limited physical exam was performed with this format.  Please refer to the patient's chart for her consent to telehealth for [facility].   Date:  06/16/2018   ID:  Kristie Owens, DOB Feb 28, 1955, MRN [id number]  Patient Location: Home Provider Location: Home  PCP:  Lenell Antu, DO  Cardiologist:  Donato Schultz, MD  Electrophysiologist:  None   Evaluation Performed:  Follow-Up Visit  Chief Complaint:  CAD follow-up  History of Present Illness:    Kristie Owens is a 63 y.o. female with coronary artery disease with heart catheterizations in 2012 and 2018 demonstrating widely patent LAD and RCA stents with mild irregularity of the ostium of the diagonal here for follow-up.  Overall on prior visits, she continued to have similar symptoms of angina.  Try to increase isosorbide.  Windedness.  She seems to be doing quite well, staying at home with her husband who is working from home.  She is selling quite a bit making masks.  She is not having any fever chills cough.  Taking her medications.  The patient does not have symptoms concerning for COVID-19 infection (fever, chills, cough, or new shortness of breath).    Past Medical History:  Diagnosis Date  . CAD (coronary artery disease), native coronary artery    DES to LAD in 2 areas, one at the bifurcation of a diagonal branch and also in the mid to distal region. Her diagonal branch was also ballooned . She has a  DES to RCA 10/10. Her circumflex artery mild ostial disease.  Kristie Owens Hx of cardiovascular stress test    ETT- Myoview (2/16):  No ischemia, EF 69%, normal study  . Hyperlipidemia   . Hypertension   . Sleep apnea    Past Surgical History:  Procedure Laterality Date  . biospy breast    . BREAST EXCISIONAL BIOPSY Right   . BREAST EXCISIONAL BIOPSY Right   . CAROTID STENT    . CESAREAN SECTION    . FOOT SURGERY    . LEFT HEART CATH AND CORONARY ANGIOGRAPHY N/A 06/04/2016   Procedure: Left Heart Cath and Coronary Angiography;  Surgeon: Lyn Records, MD;  Location: [facility] INVASIVE CV LAB;  Service: Cardiovascular;  Laterality: N/A;  . LEFT HEART CATHETERIZATION WITH CORONARY ANGIOGRAM N/A 01/16/2011   Procedure: LEFT HEART CATHETERIZATION WITH CORONARY ANGIOGRAM;  Surgeon: Donato Schultz, MD;  Location: [facility] CATH LAB;  Service: Cardiovascular;  Laterality: N/A;  . NOSE SURGERY       Current Meds  Medication Sig  . aspirin EC 81 MG tablet Take 81 mg by mouth daily.    . carvedilol (COREG) 12.5 MG tablet Take 1 tablet (12.5 mg total) by mouth 2 (two) times daily with a meal. Please schedule an appt for further refills, 1st attempt (Patient taking differently: Take 0.5 mg by mouth 2 (two) times daily with a meal. Please schedule an appt for further refills, 1st attempt)  . fluticasone (FLONASE) 50 MCG/ACT nasal spray Place  2 sprays into both nostrils 2 (two) times daily as needed for allergies or rhinitis.   Kristie Owens isosorbide mononitrate (IMDUR) 60 MG 24 hr tablet TAKE 1 TABLET BY MOUTH EVERY DAY  . lisinopril-hydrochlorothiazide (PRINZIDE,ZESTORETIC) 20-12.5 MG tablet TAKE 1 TABLET BY MOUTH EVERY DAY  . metroNIDAZOLE (METROGEL) 0.75 % gel Apply 1 application topically 2 (two) times daily.  . nitroGLYCERIN (NITROSTAT) 0.4 MG SL tablet Place 1 tablet (0.4 mg total) under the tongue every 5 (five) minutes as needed for chest pain.  Bertram Gala Glycol-Propyl Glycol (SYSTANE OP) Apply 1 drop to eye daily as needed  (dry eyes).  . rosuvastatin (CRESTOR) 10 MG tablet Take 1 tablet (10 mg total) by mouth at bedtime.  . sertraline (ZOLOFT) 50 MG tablet Take 50 mg by mouth at bedtime.   Kristie Owens zolpidem (AMBIEN) 10 MG tablet Take 10 mg by mouth at bedtime.      Allergies:   Prednisone and Sulfa antibiotics   Social History   Tobacco Use  . Smoking status: Never Smoker  . Smokeless tobacco: Never Used  Substance Use Topics  . Alcohol use: Yes    Comment: rarely  . Drug use: Not on file     Family Hx: The patient's family history includes Asthma in her brother; COPD in her mother; Cancer in her father; Heart attack in her brother and paternal grandmother; Heart disease in her mother; Heart failure in her paternal grandmother; Hyperlipidemia in her paternal grandmother; Hypertension in her mother, paternal grandmother, and sister; Ulcers in her mother. There is no history of Stroke.  ROS:   Please see the history of present illness.    Denies any fevers chills nausea vomiting syncope bleeding All other systems reviewed and are negative.   Prior CV studies:   The following studies were reviewed today:  Cardiac catheterization on 06/04/16:  Widely patent proximal and distal LAD stents. 40% ostial narrowing in the first diagonal.  Patent mid right coronary stents. Proximal 40% RCA eccentric narrowing.  Circumflex is a small vessel with 40-50% first obtuse marginal.  Ramus intermedius (or first obtuse marginal depending upon categorization) contains 40% proximal narrowing.  Normal LV function. Elevated LVEDP at 23 mmHg. EF 55%.  RECOMMENDATIONS:   When compared to the prior study, no significant changes have occurred since 2012. Continuous chest discomfort is not likely be related to ischemic heart disease.  Labs/Other Tests and Data Reviewed:    EKG:  An ECG dated 04/29/2017 was personally reviewed today and demonstrated:  Sinus rhythm no other changes  Recent Labs: No results found for  requested labs within last 8760 hours.   Recent Lipid Panel No results found for: CHOL, TRIG, HDL, CHOLHDL, LDLCALC, LDLDIRECT  Wt Readings from Last 3 Encounters:  06/16/18 192 lb (87.1 kg)  10/30/17 192 lb 3.2 oz (87.2 kg)  04/29/17 192 lb 12.8 oz (87.5 kg)     Objective:    Vital Signs:  Ht  (1.626 m)   Wt 192 lb (87.1 kg)   BMI 32.96 kg/m    VITAL SIGNS:  reviewed GEN:  no acute distress EYES:  sclerae anicteric, EOMI - Extraocular Movements Intact RESPIRATORY:  normal respiratory effort, symmetric expansion SKIN:  no rash, lesions or ulcers. MUSCULOSKELETAL:  no obvious deformities. NEURO:  alert and oriented x 3, no obvious focal deficit PSYCH:  normal affect  ASSESSMENT & PLAN:    Coronary artery disease with angina - In some ways, continuous chest discomfort does not point towards ischemic,  typical anginal qualities.  Nonetheless, we are continuing to treat her aggressively with secondary risk factor prevention.  Isosorbide being utilized. 60 Milligrams, decreased from 120 previously.  Overall she is doing well.  Hyperlipidemia -Continue with Crestor.  Doing well, LDL 77 previously.  Essential hypertension - At prior visit isosorbide was decreased to 60.  Overall has been under good control.  COVID-19 Education: The signs and symptoms of COVID-19 were discussed with the patient and how to seek care for testing (follow up with PCP or arrange E-visit).  The importance of social distancing was discussed today.  Time:   Today, I have spent 12 minutes with the patient with telehealth technology discussing the above problems.     Medication Adjustments/Labs and Tests Ordered: Current medicines are reviewed at length with the patient today.  Concerns regarding medicines are outlined above.   Tests Ordered: No orders of the defined types were placed in this encounter.   Medication Changes: No orders of the defined types were placed in this encounter.    Disposition:  Follow up in 1 year(s)  Signed, Donato SchultzMark Najla Aughenbaugh, MD  06/16/2018 4:18 PM    [facility]

## 2018-06-16 NOTE — Telephone Encounter (Signed)
appt scheduled with Dr Anne Fu for today.

## 2018-06-16 NOTE — Patient Instructions (Signed)
  Medication Instructions:  The current medical regimen is effective;  continue present plan and medications.  If you need a refill on your cardiac medications before your next appointment, please call your pharmacy.   Follow-Up: Follow up in 1 year with Dr. Skains.  You will receive a letter in the mail 2 months before you are due.  Please call us when you receive this letter to schedule your follow up appointment.  Thank you for choosing [facility]!!     

## 2018-06-16 NOTE — Telephone Encounter (Signed)
Pt. Does have smart phone.     Virtual Visit Pre-Appointment Phone Call  "(Name), I am calling you today to discuss your upcoming appointment. We are currently trying to limit exposure to the virus that causes COVID-19 by seeing patients at home rather than in the office."  1. "What is the BEST phone number to call the day of the visit?" - include this in appointment notes  2. Do you have or have access to (through a family member/friend) a smartphone with video capability that we can use for your visit?" a. If yes - list this number in appt notes as cell (if different from BEST phone #) and list the appointment type as a VIDEO visit in appointment notes b. If no - list the appointment type as a PHONE visit in appointment notes  3. Confirm consent - "In the setting of the current Covid19 crisis, you are scheduled for a (phone or video) visit with your provider on (date) at (time).  Just as we do with many in-office visits, in order for you to participate in this visit, we must obtain consent.  If you'd like, I can send this to your mychart (if signed up) or email for you to review.  Otherwise, I can obtain your verbal consent now.  All virtual visits are billed to your insurance company just like a normal visit would be.  By agreeing to a virtual visit, we'd like you to understand that the technology does not allow for your provider to perform an examination, and thus may limit your provider's ability to fully assess your condition. If your provider identifies any concerns that need to be evaluated in person, we will make arrangements to do so.  Finally, though the technology is pretty good, we cannot assure that it will always work on either your or our end, and in the setting of a video visit, we may have to convert it to a phone-only visit.  In either situation, we cannot ensure that we have a secure connection.  Are you willing to proceed?"  YES  4. Advise patient to be prepared - "Two hours  prior to your appointment, go ahead and check your blood pressure, pulse, oxygen saturation, and your weight (if you have the equipment to check those) and write them all down. When your visit starts, your provider will ask you for this information. If you have an Apple Watch or Kardia device, please plan to have heart rate information ready on the day of your appointment. Please have a pen and paper handy nearby the day of the visit as well."  5. Give patient instructions for MyChart download to smartphone OR Doximity/Doxy.me as below if video visit (depending on what platform provider is using)  6. Inform patient they will receive a phone call 15 minutes prior to their appointment time (may be from unknown caller ID) so they should be prepared to answer    TELEPHONE CALL NOTE  Kristie Owens has been deemed a candidate for a follow-up tele-health visit to limit community exposure during the Covid-19 pandemic. I spoke with the patient via phone to ensure availability of phone/video source, confirm preferred email & phone number, and discuss instructions and expectations.  I reminded Kristie Owens to be prepared with any vital sign and/or heart rhythm information that could potentially be obtained via home monitoring, at the time of her visit. I reminded Kristie Owens to expect a phone call prior to her visit.  Scherrie Bateman  06/16/2018 10:06 AM   INSTRUCTIONS FOR DOWNLOADING THE MYCHART APP TO SMARTPHONE  - The patient must first make sure to have activated MyChart and know their login information - If Apple, go to [employer] and type in MyChart in the search bar and download the app. If Android, ask patient to go to [employer] Play Store and type in [facility] NestMyChart in the search bar and download the app. The app is free but as with any other app downloads, their phone may require them to verify saved payment information or Apple/Android password.  - The patient will need to then log  into the app with their MyChart username and password, and select [facility] as their healthcare provider to link the account. When it is time for your visit, go to the MyChart app, find appointments, and click Begin Video Visit. Be sure to Select Allow for your device to access the Microphone and Camera for your visit. You will then be connected, and your provider will be with you shortly.  **If they have any issues connecting, or need assistance please contact MyChart service desk [phone])**  **If using a computer, in order to ensure the best quality for their visit they will need to use either of the following Internet Browsers: [employer], or Google Chrome**  IF USING DOXIMITY or DOXY.ME - The patient will receive a link just prior to their visit by text.     FULL LENGTH CONSENT FOR TELE-HEALTH VISIT   I hereby voluntarily request, consent and authorize [facility] and its employed or contracted physicians, physician assistants, nurse practitioners or other licensed health care professionals (the Practitioner), to provide me with telemedicine health care services (the Services") as deemed necessary by the treating Practitioner. I acknowledge and consent to receive the Services by the Practitioner via telemedicine. I understand that the telemedicine visit will involve communicating with the Practitioner through live audiovisual communication technology and the disclosure of certain medical information by electronic transmission. I acknowledge that I have been given the opportunity to request an in-person assessment or other available alternative prior to the telemedicine visit and am voluntarily participating in the telemedicine visit.  I understand that I have the right to withhold or withdraw my consent to the use of telemedicine in the course of my care at any time, without affecting my right to future care or treatment, and that the Practitioner or I may terminate  the telemedicine visit at any time. I understand that I have the right to inspect all information obtained and/or recorded in the course of the telemedicine visit and may receive copies of available information for a reasonable fee.  I understand that some of the potential risks of receiving the Services via telemedicine include:   Delay or interruption in medical evaluation due to technological equipment failure or disruption;  Information transmitted may not be sufficient (e.g. poor resolution of images) to allow for appropriate medical decision making by the Practitioner; and/or   In rare instances, security protocols could fail, causing a breach of personal health information.  Furthermore, I acknowledge that it is my responsibility to provide information about my medical history, conditions and care that is complete and accurate to the best of my ability. I acknowledge that Practitioner's advice, recommendations, and/or decision may be based on factors not within their control, such as incomplete or inaccurate data provided by me or distortions of diagnostic images or specimens that may result from electronic transmissions. I understand that the practice of medicine  is not an Visual merchandiser and that Practitioner makes no warranties or guarantees regarding treatment outcomes. I acknowledge that I will receive a copy of this consent concurrently upon execution via email to the email address I last provided but may also request a printed copy by calling the office of [facility].    I understand that my insurance will be billed for this visit.   I have read or had this consent read to me.  I understand the contents of this consent, which adequately explains the benefits and risks of the Services being provided via telemedicine.   I have been provided ample opportunity to ask questions regarding this consent and the Services and have had my questions answered to my satisfaction.  I give my  informed consent for the services to be provided through the use of telemedicine in my medical care  By participating in this telemedicine visit I agree to the above.

## 2018-06-26 ENCOUNTER — Other Ambulatory Visit: Payer: Self-pay | Admitting: Cardiology

## 2018-07-02 ENCOUNTER — Emergency Department (HOSPITAL_COMMUNITY): Payer: Managed Care, Other (non HMO)

## 2018-07-02 ENCOUNTER — Other Ambulatory Visit: Payer: Self-pay

## 2018-07-02 ENCOUNTER — Observation Stay (HOSPITAL_COMMUNITY)
Admission: EM | Admit: 2018-07-02 | Discharge: 2018-07-03 | Disposition: A | Discharge status: Home / Self Care | Payer: Managed Care, Other (non HMO) | Attending: Internal Medicine | Admitting: Internal Medicine

## 2018-07-02 ENCOUNTER — Telehealth: Payer: Self-pay | Admitting: Cardiology

## 2018-07-02 ENCOUNTER — Encounter (HOSPITAL_COMMUNITY): Payer: Self-pay | Admitting: Family Medicine

## 2018-07-02 DIAGNOSIS — Z882 Allergy status to sulfonamides status: Secondary | ICD-10-CM | POA: Insufficient documentation

## 2018-07-02 DIAGNOSIS — Z1159 Encounter for screening for other viral diseases: Secondary | ICD-10-CM | POA: Insufficient documentation

## 2018-07-02 DIAGNOSIS — R079 Chest pain, unspecified: Secondary | ICD-10-CM | POA: Diagnosis not present

## 2018-07-02 DIAGNOSIS — Z888 Allergy status to other drugs, medicaments and biological substances status: Secondary | ICD-10-CM | POA: Diagnosis not present

## 2018-07-02 DIAGNOSIS — E785 Hyperlipidemia, unspecified: Secondary | ICD-10-CM | POA: Diagnosis not present

## 2018-07-02 DIAGNOSIS — I2510 Atherosclerotic heart disease of native coronary artery without angina pectoris: Secondary | ICD-10-CM | POA: Diagnosis not present

## 2018-07-02 DIAGNOSIS — G4730 Sleep apnea, unspecified: Secondary | ICD-10-CM | POA: Insufficient documentation

## 2018-07-02 DIAGNOSIS — Z7982 Long term (current) use of aspirin: Secondary | ICD-10-CM | POA: Insufficient documentation

## 2018-07-02 DIAGNOSIS — R0789 Other chest pain: Secondary | ICD-10-CM | POA: Diagnosis not present

## 2018-07-02 DIAGNOSIS — I10 Essential (primary) hypertension: Secondary | ICD-10-CM | POA: Diagnosis not present

## 2018-07-02 DIAGNOSIS — Z8249 Family history of ischemic heart disease and other diseases of the circulatory system: Secondary | ICD-10-CM | POA: Diagnosis not present

## 2018-07-02 DIAGNOSIS — I25118 Atherosclerotic heart disease of native coronary artery with other forms of angina pectoris: Secondary | ICD-10-CM

## 2018-07-02 DIAGNOSIS — Z7951 Long term (current) use of inhaled steroids: Secondary | ICD-10-CM | POA: Diagnosis not present

## 2018-07-02 DIAGNOSIS — R29818 Other symptoms and signs involving the nervous system: Secondary | ICD-10-CM | POA: Diagnosis not present

## 2018-07-02 DIAGNOSIS — Z881 Allergy status to other antibiotic agents status: Secondary | ICD-10-CM | POA: Insufficient documentation

## 2018-07-02 DIAGNOSIS — Z79899 Other long term (current) drug therapy: Secondary | ICD-10-CM | POA: Diagnosis not present

## 2018-07-02 DIAGNOSIS — Z955 Presence of coronary angioplasty implant and graft: Secondary | ICD-10-CM | POA: Insufficient documentation

## 2018-07-02 HISTORY — DX: Other symptoms and signs involving the nervous system: R29.818

## 2018-07-02 LAB — I-STAT CHEM 8, ED
BUN: 14 mg/dL (ref 8–23)
Calcium, Ion: 1.17 mmol/L (ref 1.15–1.40)
Chloride: 105 mmol/L (ref 98–111)
Creatinine, Ser: 0.7 mg/dL (ref 0.44–1.00)
Glucose, Bld: 142 mg/dL — ABNORMAL HIGH (ref 70–99)
HCT: 38 % (ref 36.0–46.0)
Hemoglobin: 12.9 g/dL (ref 12.0–15.0)
Potassium: 3.8 mmol/L (ref 3.5–5.1)
Sodium: 141 mmol/L (ref 135–145)
TCO2: 26 mmol/L (ref 22–32)

## 2018-07-02 LAB — PROTIME-INR
INR: 1 (ref 0.8–1.2)
Prothrombin Time: 13.1 seconds (ref 11.4–15.2)

## 2018-07-02 LAB — DIFFERENTIAL
Abs Immature Granulocytes: 0 10*3/uL (ref 0.00–0.07)
Basophils Absolute: 0 10*3/uL (ref 0.0–0.1)
Basophils Relative: 1 %
Eosinophils Absolute: 0.1 10*3/uL (ref 0.0–0.5)
Eosinophils Relative: 3 %
Immature Granulocytes: 0 %
Lymphocytes Relative: 56 %
Lymphs Abs: 2.1 10*3/uL (ref 0.7–4.0)
Monocytes Absolute: 0.4 10*3/uL (ref 0.1–1.0)
Monocytes Relative: 12 %
Neutro Abs: 1.1 10*3/uL — ABNORMAL LOW (ref 1.7–7.7)
Neutrophils Relative %: 28 %

## 2018-07-02 LAB — URINALYSIS, ROUTINE W REFLEX MICROSCOPIC
Bilirubin Urine: NEGATIVE
Glucose, UA: NEGATIVE mg/dL
Hgb urine dipstick: NEGATIVE
Ketones, ur: NEGATIVE mg/dL
Leukocytes,Ua: NEGATIVE
Nitrite: NEGATIVE
Protein, ur: NEGATIVE mg/dL
Specific Gravity, Urine: 1.006 (ref 1.005–1.030)
pH: 6 (ref 5.0–8.0)

## 2018-07-02 LAB — COMPREHENSIVE METABOLIC PANEL
ALT: 25 U/L (ref 0–44)
AST: 32 U/L (ref 15–41)
Albumin: 3.8 g/dL (ref 3.5–5.0)
Alkaline Phosphatase: 84 U/L (ref 38–126)
Anion gap: 11 (ref 5–15)
BUN: 12 mg/dL (ref 8–23)
CO2: 23 mmol/L (ref 22–32)
Calcium: 9.3 mg/dL (ref 8.9–10.3)
Chloride: 105 mmol/L (ref 98–111)
Creatinine, Ser: 0.8 mg/dL (ref 0.44–1.00)
GFR calc Af Amer: >60 mL/min (ref >60)
GFR calc non Af Amer: >60 mL/min (ref >60)
Glucose, Bld: 143 mg/dL — ABNORMAL HIGH (ref 70–99)
Potassium: 3.7 mmol/L (ref 3.5–5.1)
Sodium: 139 mmol/L (ref 135–145)
Total Bilirubin: 0.3 mg/dL (ref 0.3–1.2)
Total Protein: 8 g/dL (ref 6.5–8.1)

## 2018-07-02 LAB — RAPID URINE DRUG SCREEN, HOSP PERFORMED
Amphetamines: NOT DETECTED
Barbiturates: NOT DETECTED
Benzodiazepines: NOT DETECTED
Cocaine: NOT DETECTED
Opiates: NOT DETECTED
Tetrahydrocannabinol: NOT DETECTED

## 2018-07-02 LAB — TROPONIN I
Troponin I: <0.03 ng/mL (ref <0.03)
Troponin I: <0.03 ng/mL (ref <0.03)

## 2018-07-02 LAB — APTT: aPTT: 33 seconds (ref 24–36)

## 2018-07-02 LAB — CBC
HCT: 39.5 % (ref 36.0–46.0)
Hemoglobin: 12.8 g/dL (ref 12.0–15.0)
MCH: 27.2 pg (ref 26.0–34.0)
MCHC: 32.4 g/dL (ref 30.0–36.0)
MCV: 83.9 fL (ref 80.0–100.0)
Platelets: 347 10*3/uL (ref 150–400)
RBC: 4.71 MIL/uL (ref 3.87–5.11)
RDW: 14.7 % (ref 11.5–15.5)
WBC: 3.8 10*3/uL — ABNORMAL LOW (ref 4.0–10.5)
nRBC: 0 % (ref 0.0–0.2)

## 2018-07-02 LAB — SARS CORONAVIRUS 2 BY RT PCR (HOSPITAL ORDER, PERFORMED IN ~~LOC~~ HOSPITAL LAB): SARS Coronavirus 2: NEGATIVE

## 2018-07-02 MED ORDER — ROSUVASTATIN CALCIUM 5 MG PO TABS
10.0000 mg | ORAL_TABLET | Freq: Every day | ORAL | Status: DC
  Administered 2018-07-02: 10 mg via ORAL
  Filled 2018-07-02: qty 2

## 2018-07-02 MED ORDER — ASPIRIN EC 81 MG PO TBEC
81.0000 mg | DELAYED_RELEASE_TABLET | Freq: Every day | ORAL | Status: DC
  Administered 2018-07-03: 81 mg via ORAL
  Filled 2018-07-02: qty 1

## 2018-07-02 MED ORDER — SERTRALINE HCL 50 MG PO TABS
50.0000 mg | ORAL_TABLET | Freq: Every day | ORAL | Status: DC
  Administered 2018-07-02: 50 mg via ORAL
  Filled 2018-07-02: qty 1

## 2018-07-02 MED ORDER — NITROGLYCERIN 0.4 MG SL SUBL: 0.4000 mg | SUBLINGUAL_TABLET | SUBLINGUAL | Status: DC | PRN

## 2018-07-02 MED ORDER — FAMOTIDINE 20 MG PO TABS: 20.0000 mg | ORAL_TABLET | Freq: Every day | ORAL | Status: DC | PRN

## 2018-07-02 MED ORDER — LISINOPRIL 20 MG PO TABS
20.0000 mg | ORAL_TABLET | Freq: Every day | ORAL | Status: DC
  Administered 2018-07-03: 20 mg via ORAL
  Filled 2018-07-02: qty 1

## 2018-07-02 MED ORDER — ZOLPIDEM TARTRATE 5 MG PO TABS
5.0000 mg | ORAL_TABLET | Freq: Every day | ORAL | Status: DC
  Administered 2018-07-02: 5 mg via ORAL
  Filled 2018-07-02: qty 1

## 2018-07-02 MED ORDER — ACETAMINOPHEN 325 MG PO TABS: 650.0000 mg | ORAL_TABLET | ORAL | Status: DC | PRN

## 2018-07-02 MED ORDER — LISINOPRIL-HYDROCHLOROTHIAZIDE 20-12.5 MG PO TABS: 1.0000 | ORAL_TABLET | Freq: Every day | ORAL | Status: DC

## 2018-07-02 MED ORDER — HYDROCHLOROTHIAZIDE 12.5 MG PO CAPS
12.5000 mg | ORAL_CAPSULE | Freq: Every day | ORAL | Status: DC
  Administered 2018-07-03: 12.5 mg via ORAL
  Filled 2018-07-02: qty 1

## 2018-07-02 MED ORDER — ENOXAPARIN SODIUM 40 MG/0.4ML ~~LOC~~ SOLN
40.0000 mg | Freq: Every day | SUBCUTANEOUS | Status: DC
  Administered 2018-07-03: 40 mg via SUBCUTANEOUS
  Filled 2018-07-02: qty 0.4

## 2018-07-02 MED ORDER — IOHEXOL 350 MG/ML SOLN
100.0000 mL | Freq: Once | INTRAVENOUS | Status: AC | PRN
  Administered 2018-07-02: 100 mL via INTRAVENOUS

## 2018-07-02 MED ORDER — ISOSORBIDE MONONITRATE ER 60 MG PO TB24
60.0000 mg | ORAL_TABLET | Freq: Every day | ORAL | Status: DC
  Administered 2018-07-03: 60 mg via ORAL
  Filled 2018-07-02: qty 1

## 2018-07-02 MED ORDER — CARVEDILOL 6.25 MG PO TABS
6.2500 mg | ORAL_TABLET | Freq: Two times a day (BID) | ORAL | Status: DC
  Administered 2018-07-03: 6.25 mg via ORAL
  Filled 2018-07-02: qty 1

## 2018-07-02 MED ORDER — CARVEDILOL 12.5 MG PO TABS: 12.5000 mg | ORAL_TABLET | Freq: Two times a day (BID) | ORAL | Status: DC

## 2018-07-02 MED ORDER — MONTELUKAST SODIUM 10 MG PO TABS
10.0000 mg | ORAL_TABLET | Freq: Every day | ORAL | Status: DC
  Administered 2018-07-02: 10 mg via ORAL
  Filled 2018-07-02 (×2): qty 1

## 2018-07-02 MED ORDER — ONDANSETRON HCL 4 MG/2ML IJ SOLN: 4.0000 mg | Freq: Four times a day (QID) | INTRAMUSCULAR | Status: DC | PRN

## 2018-07-02 NOTE — Telephone Encounter (Signed)
Pt c/o of Chest Pain: STAT if CP now or developed within 24 hours  1. Are you having CP right now? No. Chest pain started  around lunch time.   2. Are you experiencing any other symptoms (ex. SOB, nausea, vomiting, sweating)? Dizziness, saw spots,  left side numbness, unable to walk.   3. How long have you been experiencing CP?: not currently  4. Is your CP continuous or coming and going? Continuous for ~30 minutes during spell.   5. Have you taken Nitroglycerin? 2 ?

## 2018-07-02 NOTE — ED Notes (Signed)
Pt is back from CT

## 2018-07-02 NOTE — ED Notes (Signed)
ED TO INPATIENT HANDOFF REPORT  ED Nurse Name and Phone #:  Holger Sokolowski [phone]  S Name/Age/Gender Kristie Owens [birth year] 63 y.o. female Room/Bed: 040C/040C  Code Status   Code Status: Full Code  Home/SNF/Other Home Patient oriented to: self, place, time and situation Is this baseline? Yes   Triage Complete: Triage complete  Chief Complaint stroke sx  Triage Note Starting at 1130  outside working in yard when she came in , saw spots and L side of body went numb/ arm and leg with CP.  States lasted approx 20 minutes.  Called her cardiologist and PMD and was instructed to come to ED.  Hx of stents cardiac x 3.  No hx of CVA, take baby ASA every day.  Only remaining sx are L arm numbness.  Alert and appropriate.  No pain noted.   Allergies Allergies  Allergen Reactions  . Sulfa Antibiotics Other (See Comments)    From childhood- reaction not recalled  . Prednisone Anxiety and Other (See Comments)    "Made me not feel like myself, altered"    Level of Care/Admitting Diagnosis ED Disposition    ED Disposition Condition Comment   Admit  Hospital Area: [facility] [[id number]]  Level of Care: Telemetry Cardiac [103]  I expect the patient will be discharged within 24 hours: Yes  LOW acuity---Tx typically complete <24 hrs---ACUTE conditions typically can be evaluated <24 hours---LABS likely to return to acceptable levels <24 hours---IS near functional baseline---EXPECTED to return to current living arrangement---NOT newly hypoxic: Does not meet criteria for 5C-Observation unit  Covid Evaluation: Screening Protocol (No Symptoms)  Diagnosis: Chest pain [[id number]]  Admitting Physician: Briscoe Deutscher [[id number]]  Attending Physician: Briscoe Deutscher [[id number]]  PT Class (Do Not Modify): Observation [104]  PT Acc Code (Do Not Modify): Observation [10022]       B Medical/Surgery History Past Medical History:  Diagnosis Date  . CAD (coronary artery disease), native  coronary artery    DES to LAD in 2 areas, one at the bifurcation of a diagonal branch and also in the mid to distal region. Her diagonal branch was also ballooned . She has a DES to RCA 10/10. Her circumflex artery mild ostial disease.  [name] Hx of cardiovascular stress test    ETT- Myoview (2/16):  No ischemia, EF 69%, normal study  . Hyperlipidemia   . Hypertension   . Sleep apnea    Past Surgical History:  Procedure Laterality Date  . biospy breast    . BREAST EXCISIONAL BIOPSY Right   . BREAST EXCISIONAL BIOPSY Right   . CAROTID STENT    . CESAREAN SECTION    . FOOT SURGERY    . LEFT HEART CATH AND CORONARY ANGIOGRAPHY N/A 06/04/2016   Procedure: Left Heart Cath and Coronary Angiography;  Surgeon: Lyn Records, MD;  Location: [facility] INVASIVE CV LAB;  Service: Cardiovascular;  Laterality: N/A;  . LEFT HEART CATHETERIZATION WITH CORONARY ANGIOGRAM N/A 01/16/2011   Procedure: LEFT HEART CATHETERIZATION WITH CORONARY ANGIOGRAM;  Surgeon: Donato Schultz, MD;  Location: [facility] CATH LAB;  Service: Cardiovascular;  Laterality: N/A;  . NOSE SURGERY       A IV Location/Drains/Wounds Patient Lines/Drains/Airways Status   Active Line/Drains/Airways    Name:   Placement date:   Placement time:   Site:   Days:   Peripheral IV 07/02/18 Left Antecubital   07/02/18    1832    Antecubital   less than 1  Intake/Output Last 24 hours No intake or output data in the 24 hours ending 07/02/18 2209  Labs/Imaging Results for orders placed or performed during the hospital encounter of 07/02/18 (from the past 48 hour(s))  Protime-INR     Status: None   Collection Time: 07/02/18  3:56 PM  Result Value Ref Range   Prothrombin Time 13.1 11.4 - 15.2 seconds   INR 1.0 0.8 - 1.2    Comment: (NOTE) INR goal varies based on device and disease states. Performed at [facility], [address], [place], [place] [address]   APTT     Status: None   Collection Time: 07/02/18  3:56 PM  Result Value Ref  Range   aPTT 33 24 - 36 seconds    Comment: Performed at [facility], [address] [place], [place] [address]  CBC     Status: Abnormal   Collection Time: 07/02/18  3:56 PM  Result Value Ref Range   WBC 3.8 (L) 4.0 - 10.5 K/uL   RBC 4.71 3.87 - 5.11 MIL/uL   Hemoglobin 12.8 12.0 - 15.0 g/dL   HCT 09.8 11.9 - 14.7 %   MCV 83.9 80.0 - 100.0 fL   MCH 27.2 26.0 - 34.0 pg   MCHC 32.4 30.0 - 36.0 g/dL   RDW 82.9 56.2 - 13.0 %   Platelets 347 150 - 400 K/uL   nRBC 0.0 0.0 - 0.2 %    Comment: Performed at [facility], [address], [place], [place] [address]  Differential     Status: Abnormal   Collection Time: 07/02/18  3:56 PM  Result Value Ref Range   Neutrophils Relative % 28 %   Neutro Abs 1.1 (L) 1.7 - 7.7 K/uL   Lymphocytes Relative 56 %   Lymphs Abs 2.1 0.7 - 4.0 K/uL   Monocytes Relative 12 %   Monocytes Absolute 0.4 0.1 - 1.0 K/uL   Eosinophils Relative 3 %   Eosinophils Absolute 0.1 0.0 - 0.5 K/uL   Basophils Relative 1 %   Basophils Absolute 0.0 0.0 - 0.1 K/uL   Immature Granulocytes 0 %   Abs Immature Granulocytes 0.00 0.00 - 0.07 K/uL    Comment: Performed at [facility], [address] [place], [place] [address]  Comprehensive metabolic panel     Status: Abnormal   Collection Time: 07/02/18  3:56 PM  Result Value Ref Range   Sodium 139 135 - 145 mmol/L   Potassium 3.7 3.5 - 5.1 mmol/L   Chloride 105 98 - 111 mmol/L   CO2 23 22 - 32 mmol/L   Glucose, Bld 143 (H) 70 - 99 mg/dL   BUN 12 8 - 23 mg/dL   Creatinine, Ser 9.52 0.44 - 1.00 mg/dL   Calcium 9.3 8.9 - 84.1 mg/dL   Total Protein 8.0 6.5 - 8.1 g/dL   Albumin 3.8 3.5 - 5.0 g/dL   AST 32 15 - 41 U/L   ALT 25 0 - 44 U/L   Alkaline Phosphatase 84 38 - 126 U/L   Total Bilirubin 0.3 0.3 - 1.2 mg/dL   GFR calc non Af Amer >60 >60 mL/min   GFR calc Af Amer >60 >60 mL/min   Anion gap 11 5 - 15    Comment: Performed at [facility], [address] [place], [place] [address]   Troponin I - ONCE - STAT     Status: None   Collection Time:  07/02/18  3:56 PM  Result Value Ref Range   Troponin I <0.03 <0.03 ng/mL    Comment: Performed at [facility], [address], [place], [place] [address]  I-stat chem 8, ED     Status: Abnormal   Collection Time: 07/02/18  4:06 PM  Result Value Ref Range   Sodium 141 135 - 145 mmol/L   Potassium 3.8 3.5 - 5.1 mmol/L   Chloride 105 98 - 111 mmol/L   BUN 14 8 - 23 mg/dL   Creatinine, Ser 6.04 0.44 - 1.00 mg/dL   Glucose, Bld 540 (H) 70 - 99 mg/dL   Calcium, Ion 9.81 1.91 - 1.40 mmol/L   TCO2 26 22 - 32 mmol/L   Hemoglobin 12.9 12.0 - 15.0 g/dL   HCT 47.8 29.5 - 62.1 %  Urine rapid drug screen (hosp performed)     Status: None   Collection Time: 07/02/18  4:49 PM  Result Value Ref Range   Opiates NONE DETECTED NONE DETECTED   Cocaine NONE DETECTED NONE DETECTED   Benzodiazepines NONE DETECTED NONE DETECTED   Amphetamines NONE DETECTED NONE DETECTED   Tetrahydrocannabinol NONE DETECTED NONE DETECTED   Barbiturates NONE DETECTED NONE DETECTED    Comment: (NOTE) DRUG SCREEN FOR MEDICAL PURPOSES ONLY.  IF CONFIRMATION IS NEEDED FOR ANY PURPOSE, NOTIFY LAB WITHIN 5 DAYS. LOWEST DETECTABLE LIMITS FOR URINE DRUG SCREEN Drug Class                     Cutoff (ng/mL) Amphetamine and metabolites    1000 Barbiturate and metabolites    200 Benzodiazepine                 200 Tricyclics and metabolites     300 Opiates and metabolites        300 Cocaine and metabolites        300 THC                            50 Performed at [facility], [address], [place], [place] [address]   Urinalysis, Routine w reflex microscopic     Status: Abnormal   Collection Time: 07/02/18  4:50 PM  Result Value Ref Range   Color, Urine STRAW (A) YELLOW   APPearance CLEAR CLEAR   Specific Gravity, Urine 1.006 1.005 - 1.030   pH 6.0 5.0 - 8.0   Glucose, UA NEGATIVE NEGATIVE mg/dL   Hgb urine dipstick NEGATIVE NEGATIVE    Bilirubin Urine NEGATIVE NEGATIVE   Ketones, ur NEGATIVE NEGATIVE mg/dL   Protein, ur NEGATIVE NEGATIVE mg/dL   Nitrite NEGATIVE NEGATIVE   Leukocytes,Ua NEGATIVE NEGATIVE    Comment: Performed at [facility], [address], [place], [place] [address]  SARS Coronavirus 2 (CEPHEID - Performed in [facility] hospital lab), Hosp Order     Status: None   Collection Time: 07/02/18  8:10 PM  Result Value Ref Range   SARS Coronavirus 2 NEGATIVE NEGATIVE    Comment: (NOTE) If result is NEGATIVE SARS-CoV-2 target nucleic acids are NOT DETECTED. The SARS-CoV-2 RNA is generally detectable in upper and lower  respiratory specimens during the acute phase of infection. The lowest  concentration of SARS-CoV-2 viral copies this assay can detect is 250  copies / mL. A negative result does not preclude SARS-CoV-2 infection  and should not be used as the sole basis for treatment or other  patient management decisions.  A negative result may occur with  improper specimen collection / handling, submission of specimen other  than nasopharyngeal swab, presence of viral mutation(s) within the  areas targeted by this assay, and inadequate number of viral copies  (<250 copies / mL). A negative result must be combined with clinical  observations, patient history, and epidemiological information. If result is POSITIVE SARS-CoV-2 target nucleic acids are DETECTED. The SARS-CoV-2 RNA is generally detectable in upper and lower  respiratory specimens dur ing the acute phase of infection.  Positive  results are indicative of active infection with SARS-CoV-2.  Clinical  correlation with patient history and other diagnostic information is  necessary to determine patient infection status.  Positive results do  not rule out bacterial infection or co-infection with other viruses. If result is PRESUMPTIVE POSTIVE SARS-CoV-2 nucleic acids MAY BE PRESENT.   A presumptive positive result was obtained on the  submitted specimen  and confirmed on repeat testing.  While 2019 novel coronavirus  (SARS-CoV-2) nucleic acids may be present in the submitted sample  additional confirmatory testing may be necessary for epidemiological  and / or clinical management purposes  to differentiate between  SARS-CoV-2 and other Sarbecovirus currently known to infect humans.  If clinically indicated additional testing with an alternate test  methodology [phone] is advised. The SARS-CoV-2 RNA is generally  detectable in upper and lower respiratory sp ecimens during the acute  phase of infection. The expected result is Negative. Fact Sheet for Patients:  BoilerBrush.com.cyhttps://www.fda.gov/media/136312/download Fact Sheet for Healthcare Providers: https://pope.com/https://www.fda.gov/media/136313/download This test is not yet approved or cleared by the [place] FDA and has been authorized for detection and/or diagnosis of SARS-CoV-2 by FDA under an Emergency Use Authorization (EUA).  This EUA will remain in effect (meaning this test can be used) for the duration of the COVID-19 declaration under Section 564(b)(1) of the Act, 21 U.S.C. section 360bbb-3(b)(1), unless the authorization is terminated or revoked sooner. Performed at [facility], [address] [place], [place] [id number]    Ct Head Wo Contrast  Result Date: 07/02/2018 CLINICAL DATA:  63 year old female with left-sided numbness and TIA EXAM: CT HEAD WITHOUT CONTRAST TECHNIQUE: Contiguous axial images were obtained from the base of the skull through the vertex without intravenous contrast. COMPARISON:  None. FINDINGS: Brain: The ventricles and sulci appropriate size for patient's age. The gray-white matter discrimination is preserved. There is no acute intracranial hemorrhage. No mass effect or midline shift. No extra-axial fluid collection. Vascular: No hyperdense vessel or unexpected calcification. Skull: Normal. Negative for fracture or focal lesion. There is  hyperostosis frontalis. Sinuses/Orbits: No acute finding. Other: None IMPRESSION: Unremarkable noncontrast CT of the brain. Electronically Signed   By: Elgie CollardArash  Radparvar M.D.   On: 07/02/2018 19:33   Dg Chest Portable 1 View  Result Date: 07/02/2018 CLINICAL DATA:  Right-sided weakness and headache EXAM: PORTABLE CHEST 1 VIEW COMPARISON:  November 23, 2008 FINDINGS: Lungs are clear. Heart size and pulmonary vascularity are normal. No adenopathy. No evident bone lesions. IMPRESSION: No edema or consolidation.  Heart size within normal limits. Electronically Signed   By: Bretta BangWilliam  Woodruff III M.D.   On: 07/02/2018 16:14   Ct Angio Chest/abd/pel For Dissection W And/or Wo Contrast  Result Date: 07/02/2018 CLINICAL DATA:  Left-sided numbness, evaluate for aortic dissection EXAM: CT ANGIOGRAPHY CHEST, ABDOMEN AND PELVIS TECHNIQUE: Multidetector CT imaging through the chest, abdomen and pelvis was performed using the standard protocol during bolus administration of intravenous contrast. Multiplanar  reconstructed images and MIPs were obtained and reviewed to evaluate the vascular anatomy. CONTRAST:  OMNIPAQUE IOHEXOL 350 MG/ML SOLN COMPARISON:  01/03/2010 FINDINGS: CTA CHEST FINDINGS Cardiovascular: Preferential opacification of the thoracic aorta. No evidence of thoracic aortic aneurysm or dissection. Normal heart size. Coronary artery calcifications and stents. No pericardial effusion. Mediastinum/Nodes: No enlarged mediastinal, hilar, or axillary lymph nodes. Thyroid gland, trachea, and esophagus demonstrate no significant findings. Lungs/Pleura: Lungs are clear. No pleural effusion or pneumothorax. Musculoskeletal: No chest wall abnormality. No acute or significant osseous findings. Review of the MIP images confirms the above findings. CTA ABDOMEN AND PELVIS FINDINGS VASCULAR Aorta: Moderate mixed calcific atherosclerosis. Normal caliber aorta without aneurysm, dissection, vasculitis or significant stenosis.  Celiac: Patent without evidence of aneurysm, dissection, vasculitis or significant stenosis. SMA: Patent without evidence of aneurysm, dissection, vasculitis or significant stenosis. Renals: Both renal arteries are patent without evidence of aneurysm, dissection, vasculitis, fibromuscular dysplasia or significant stenosis. IMA: Patent without evidence of aneurysm, dissection, vasculitis or significant stenosis. Inflow: Patent without evidence of aneurysm, dissection, vasculitis or significant stenosis. Veins: No obvious venous abnormality within the limitations of this arterial phase study. Review of the MIP images confirms the above findings. NON-VASCULAR Hepatobiliary: No focal liver abnormality is seen. No gallstones, gallbladder wall thickening, or biliary dilatation. Pancreas: Unremarkable. No pancreatic ductal dilatation or surrounding inflammatory changes. Spleen: Normal in size without focal abnormality. Adrenals/Urinary Tract: Adrenal glands are unremarkable. Multiple bilateral renal cysts. No calculi or hydronephrosis. Bladder is unremarkable. Stomach/Bowel: Stomach is within normal limits. Appendix appears normal. No evidence of bowel wall thickening, distention, or inflammatory changes. Sigmoid diverticulosis. Large burden of stool in the colon. Lymphatic: No enlarged abdominal or pelvic lymph nodes. Reproductive: Low-attenuation lesions of the bilateral ovaries. Likely small cysts or follicles. Other: No abdominal wall hernia or abnormality. There is a fluid attenuation cystic lesion in the left retroperitoneum or paracolic recess, of uncertain etiology or significance (series 5, image 159). This finding is new compared to CT dated 01/03/2010. No abdominopelvic ascites. Musculoskeletal: No acute or significant osseous findings. Review of the MIP images confirms the above findings. IMPRESSION: 1. Moderate aortic atherosclerosis without evidence of dissection, aneurysm, or other acute aortic pathology. 2.   Coronary artery disease. 3. There is a fluid attenuation cystic lesion in the left retroperitoneum or paracolic recess, of uncertain etiology or significance (series 5, image 159). This finding is new compared to CT dated 01/03/2010. 4.  Other chronic and incidental findings as detailed above. Electronically Signed   By: Lauralyn Primes M.D.   On: 07/02/2018 19:38    Pending Labs Unresulted Labs (From admission, onward)    Start     Ordered   07/09/18 0500  Creatinine, serum  (enoxaparin (LOVENOX)    CrCl >/= 30 ml/min)  Weekly,   R    Comments:  while on enoxaparin therapy    07/02/18 2101   07/03/18 0500  Basic metabolic panel  Tomorrow morning,   R     07/02/18 2101   07/03/18 0500  CBC WITH DIFFERENTIAL  Tomorrow morning,   R     07/02/18 2101   07/02/18 2101  Troponin I - Now Then Q6H  Now then every 6 hours,   STAT     07/02/18 2101   07/02/18 2100  HIV antibody (Routine Testing)  Once,   R     07/02/18 2101          Vitals/Pain Today's Vitals   07/02/18 2013 07/02/18 2130 07/02/18  2148 07/02/18 2155  BP: (!) 172/79 (!) 154/82 (!) 168/92   Pulse: 80 81  82  Resp: (!) 28 (!) 21  (!) 21  Temp: 98.2 F (36.8 C)     TempSrc: Oral     SpO2: 96% 95%  95%  Weight:      Height:      PainSc:        Isolation Precautions No active isolations  Medications Medications  aspirin EC tablet 81 mg (has no administration in time range)  isosorbide mononitrate (IMDUR) 24 hr tablet 60 mg (has no administration in time range)  rosuvastatin (CRESTOR) tablet 10 mg (has no administration in time range)  sertraline (ZOLOFT) tablet 50 mg (has no administration in time range)  zolpidem (AMBIEN) tablet 5 mg (has no administration in time range)  nitroGLYCERIN (NITROSTAT) SL tablet 0.4 mg (has no administration in time range)  acetaminophen (TYLENOL) tablet 650 mg (has no administration in time range)  ondansetron (ZOFRAN) injection 4 mg (has no administration in time range)  enoxaparin  (LOVENOX) injection 40 mg (has no administration in time range)  famotidine (PEPCID) tablet 20 mg (has no administration in time range)  montelukast (SINGULAIR) tablet 10 mg (has no administration in time range)  lisinopril (ZESTRIL) tablet 20 mg (has no administration in time range)    And  hydrochlorothiazide (MICROZIDE) capsule 12.5 mg (has no administration in time range)  carvedilol (COREG) tablet 6.25 mg (has no administration in time range)  iohexol (OMNIPAQUE) 350 MG/ML injection 100 mL (100 mLs Intravenous Contrast Given 07/02/18 1914)    Mobility walks     Focused Assessments Cardiac Assessment Handoff:  Cardiac Rhythm: Normal sinus rhythm Lab Results  Component Value Date   CKTOTAL 31 11/24/2008   CKMB 0.3 11/24/2008   TROPONINI <0.03 07/02/2018   No results found for: DDIMER Does the Patient currently have chest pain? No     R Recommendations: See Admitting Provider Note  Report given to:   Additional Notes:

## 2018-07-02 NOTE — H&P (Signed)
History and Physical    Kristie DecemberDeborah Dunlap Elizondo WUJ:[id number] DOB: 04/30/1955 DOA: 07/02/2018  PCP: Lenell AntuLe, Thao P, DO   Patient coming from: Home   Chief Complaint: Chest pain, left arm and leg numbness and weakness   HPI: Kristie DecemberDeborah Dunlap Stockhausen is a 63 y.o. female with medical history significant for coronary artery disease with stents, hypertension, and hyperlipidemia, now presenting to the emergency department after an episode of chest pain and left-sided numbness and weakness.  Patient reports that she been in her usual state of health and was washing some outdoor furniture when she developed acute onset of chest pain similar to her prior experiences with angina.  She also developed some lightheadedness at about the same time and noted numbness and weakness involving her left arm and leg, describing that she drag the left foot when going inside to get her nitroglycerin, then had difficulty holding the nitroglycerin bottle with her left hand.  Chest pain resolved with nitroglycerin and the numbness and weakness seem to completely resolve within approximately 30 minutes.  She had never experienced the neurologic symptoms previously.  She also took 2 baby aspirin before coming in.  No recent fevers, chills, shortness of breath, or cough.  She denies lower extremity swelling or tenderness.  ED Course: Upon arrival to the ED, patient is found to be afebrile, saturating well on room air, and with remaining vitals also stable.  EKG features a sinus rhythm with nonspecific T wave abnormality.  Chest x-ray is negative for edema or consolidation.  Noncontrast head CT is unremarkable.  CTA chest//abdomen/pelvis was negative for any aortic pathology.  Patient remained hemodynamically stable in the ED, reports feeling back to her baseline, but with her chest pain in the setting of known CAD, and because of her transient neurologic deficits with risk factors for CVA, she will be observed for ongoing evaluation and  management.  Review of Systems:  All other systems reviewed and apart from HPI, are negative.  Past Medical History:  Diagnosis Date   CAD (coronary artery disease), native coronary artery    DES to LAD in 2 areas, one at the bifurcation of a diagonal branch and also in the mid to distal region. Her diagonal branch was also ballooned . She has a DES to RCA 10/10. Her circumflex artery mild ostial disease.   Hx of cardiovascular stress test    ETT- Myoview (2/16):  No ischemia, EF 69%, normal study   Hyperlipidemia    Hypertension    Sleep apnea     Past Surgical History:  Procedure Laterality Date   biospy breast     BREAST EXCISIONAL BIOPSY Right    BREAST EXCISIONAL BIOPSY Right    CAROTID STENT     CESAREAN SECTION     FOOT SURGERY     LEFT HEART CATH AND CORONARY ANGIOGRAPHY N/A 06/04/2016   Procedure: Left Heart Cath and Coronary Angiography;  Surgeon: Lyn RecordsSmith, Henry W, MD;  [facility];  Service: Cardiovascular;  Laterality: N/A;   LEFT HEART CATHETERIZATION WITH CORONARY ANGIOGRAM N/A 01/16/2011   Procedure: LEFT HEART CATHETERIZATION WITH CORONARY ANGIOGRAM;  Surgeon: Donato SchultzMark Skains, MD;  [facility];  Service: Cardiovascular;  Laterality: N/A;   NOSE SURGERY       reports that she has never smoked. She has never used smokeless tobacco. She reports current alcohol use. No history on file for drug.  Allergies  Allergen Reactions   Sulfa Antibiotics Other (See Comments)  From childhood- reaction not recalled   Prednisone Anxiety and Other (See Comments)    "Made me not feel like myself, altered"    Family History  Problem Relation Age of Onset   Hypertension Mother    Heart disease Mother    COPD Mother    Ulcers Mother    Cancer Father    Heart failure Paternal Grandmother    Heart attack Paternal Grandmother    Hypertension Paternal Grandmother    Hyperlipidemia Paternal Grandmother    Heart attack Brother      Asthma Brother    Hypertension Sister    Stroke Neg Hx      Prior to Admission medications   Medication Sig Start Date End Date Taking? Authorizing Provider  aspirin EC 81 MG tablet Take 81 mg by mouth daily.      [provider]  carvedilol (COREG) 12.5 MG tablet Take 1 tablet (12.5 mg total) by mouth 2 (two) times daily with a meal. 06/26/18   Jake Bathe, MD  fluticasone (FLONASE) 50 MCG/ACT nasal spray Place 2 sprays into both nostrils 2 (two) times daily as needed for allergies or rhinitis.  01/11/14   [provider]  isosorbide mononitrate (IMDUR) 60 MG 24 hr tablet TAKE 1 TABLET BY MOUTH EVERY DAY 05/05/18   Jake Bathe, MD  lisinopril-hydrochlorothiazide (PRINZIDE,ZESTORETIC) 20-12.5 MG tablet TAKE 1 TABLET BY MOUTH EVERY DAY 06/11/17   Jake Bathe, MD  metroNIDAZOLE (METROGEL) 0.75 % gel Apply 1 application topically 2 (two) times daily. 05/03/16   [provider]  nitroGLYCERIN (NITROSTAT) 0.4 MG SL tablet Place 1 tablet (0.4 mg total) under the tongue every 5 (five) minutes as needed for chest pain. 03/18/14   Tereso Newcomer T, PA-C  Polyethyl Glycol-Propyl Glycol (SYSTANE OP) Apply 1 drop to eye daily as needed (dry eyes).    [provider]  rosuvastatin (CRESTOR) 10 MG tablet Take 1 tablet (10 mg total) by mouth at bedtime. 07/08/17   Jake Bathe, MD  sertraline (ZOLOFT) 50 MG tablet Take 50 mg by mouth at bedtime.     [provider]  zolpidem (AMBIEN) 10 MG tablet Take 10 mg by mouth at bedtime.  03/12/14   [provider]    Physical Exam: Vitals:   07/02/18 1815 07/02/18 1830 07/02/18 1930 07/02/18 2013  BP: (!) 147/73 131/69 (!) 166/74 (!) 172/79  Pulse: 67 73 79 80  Resp:   15 (!) 28  Temp:    98.2 F (36.8 C)  TempSrc:    Oral  SpO2: 96% 95% 97% 96%  Weight:      Height:        Constitutional: NAD, calm  Eyes: PERTLA, lids and conjunctivae normal ENMT: Mucous membranes are moist. Posterior pharynx  clear of any exudate or lesions.   Neck: normal, supple, no masses, no thyromegaly Respiratory: clear to auscultation bilaterally, no wheezing, no crackles.  No accessory muscle use.  Cardiovascular: S1 & S2 heard, regular rate and rhythm. No extremity edema.  Abdomen: No distension, no tenderness, soft. Bowel sounds normal.  Musculoskeletal: no clubbing / cyanosis. No joint deformity upper and lower extremities.    Skin: no significant rashes, lesions, ulcers. Warm, dry, well-perfused. Neurologic: CN 2-12 grossly intact. Sensation intact, DTR normal. Strength 5/5 in all 4 limbs.  Psychiatric:  Alert and oriented x 3. Very pleasant and cooperative.    Labs on Admission: I have personally reviewed following labs and imaging studies  CBC:  Recent Labs  Lab 07/02/18 1556 07/02/18 1606  WBC 3.8*  --   NEUTROABS 1.1*  --   HGB 12.8 12.9  HCT 39.5 38.0  MCV 83.9  --   PLT 347  --    Basic Metabolic Panel: Recent Labs  Lab 07/02/18 1556 07/02/18 1606  NA 139 141  K 3.7 3.8  CL 105 105  CO2 23  --   GLUCOSE 143* 142*  BUN 12 14  CREATININE 0.80 0.70  CALCIUM 9.3  --    GFR: Estimated Creatinine Clearance: 75.9 mL/min (by C-G formula based on SCr of 0.7 mg/dL). Liver Function Tests: Recent Labs  Lab 07/02/18 1556  AST 32  ALT 25  ALKPHOS 84  BILITOT 0.3  PROT 8.0  ALBUMIN 3.8   No results for input(s): LIPASE, AMYLASE in the last 168 hours. No results for input(s): AMMONIA in the last 168 hours. Coagulation Profile: Recent Labs  Lab 07/02/18 1556  INR 1.0   Cardiac Enzymes: Recent Labs  Lab 07/02/18 1556  TROPONINI <0.03   BNP (last 3 results) No results for input(s): PROBNP in the last 8760 hours. HbA1C: No results for input(s): HGBA1C in the last 72 hours. CBG: No results for input(s): GLUCAP in the last 168 hours. Lipid Profile: No results for input(s): CHOL, HDL, LDLCALC, TRIG, CHOLHDL, LDLDIRECT in the last 72 hours. Thyroid Function Tests: No  results for input(s): TSH, T4TOTAL, FREET4, T3FREE, THYROIDAB in the last 72 hours. Anemia Panel: No results for input(s): VITAMINB12, FOLATE, FERRITIN, TIBC, IRON, RETICCTPCT in the last 72 hours. Urine analysis:    Component Value Date/Time   COLORURINE STRAW (A) 07/02/2018 1650   APPEARANCEUR CLEAR 07/02/2018 1650   LABSPEC 1.006 07/02/2018 1650   PHURINE 6.0 07/02/2018 1650   GLUCOSEU NEGATIVE 07/02/2018 1650   HGBUR NEGATIVE 07/02/2018 1650   BILIRUBINUR NEGATIVE 07/02/2018 1650   KETONESUR NEGATIVE 07/02/2018 1650   PROTEINUR NEGATIVE 07/02/2018 1650   NITRITE NEGATIVE 07/02/2018 1650   LEUKOCYTESUR NEGATIVE 07/02/2018 1650   Sepsis Labs: (procalcitonin:4,lacticidven:4) )No results found for this or any previous visit (from the past 240 hour(s)).   Radiological Exams on Admission: Ct Head Wo Contrast  Result Date: 07/02/2018 CLINICAL DATA:  63 year old female with left-sided numbness and TIA EXAM: CT HEAD WITHOUT CONTRAST TECHNIQUE: Contiguous axial images were obtained from the base of the skull through the vertex without intravenous contrast. COMPARISON:  [facility]. FINDINGS: Brain: The ventricles and sulci appropriate size for patient's age. The gray-white matter discrimination is preserved. There is no acute intracranial hemorrhage. No mass effect or midline shift. No extra-axial fluid collection. Vascular: No hyperdense vessel or unexpected calcification. Skull: Normal. Negative for fracture or focal lesion. There is hyperostosis frontalis. Sinuses/Orbits: No acute finding. Other: [facility] IMPRESSION: Unremarkable noncontrast CT of the brain. Electronically Signed   By: Elgie Collard M.D.   On: 07/02/2018 19:33   Dg Chest Portable 1 View  Result Date: 07/02/2018 CLINICAL DATA:  Right-sided weakness and headache EXAM: PORTABLE CHEST 1 VIEW COMPARISON:  November 23, 2008 FINDINGS: Lungs are clear. Heart size and pulmonary vascularity are normal. No adenopathy. No evident bone  lesions. IMPRESSION: No edema or consolidation.  Heart size within normal limits. Electronically Signed   By: Bretta Bang III M.D.   On: 07/02/2018 16:14   Ct Angio Chest/abd/pel For Dissection W And/or Wo Contrast  Result Date: 07/02/2018 CLINICAL DATA:  Left-sided numbness, evaluate for aortic dissection EXAM: CT ANGIOGRAPHY CHEST, ABDOMEN AND PELVIS TECHNIQUE: Multidetector CT imaging  through the chest, abdomen and pelvis was performed using the standard protocol during bolus administration of intravenous contrast. Multiplanar reconstructed images and MIPs were obtained and reviewed to evaluate the vascular anatomy. CONTRAST:  OMNIPAQUE IOHEXOL 350 MG/ML SOLN COMPARISON:  01/03/2010 FINDINGS: CTA CHEST FINDINGS Cardiovascular: Preferential opacification of the thoracic aorta. No evidence of thoracic aortic aneurysm or dissection. Normal heart size. Coronary artery calcifications and stents. No pericardial effusion. Mediastinum/Nodes: No enlarged mediastinal, hilar, or axillary lymph nodes. Thyroid gland, trachea, and esophagus demonstrate no significant findings. Lungs/Pleura: Lungs are clear. No pleural effusion or pneumothorax. Musculoskeletal: No chest wall abnormality. No acute or significant osseous findings. Review of the MIP images confirms the above findings. CTA ABDOMEN AND PELVIS FINDINGS VASCULAR Aorta: Moderate mixed calcific atherosclerosis. Normal caliber aorta without aneurysm, dissection, vasculitis or significant stenosis. Celiac: Patent without evidence of aneurysm, dissection, vasculitis or significant stenosis. SMA: Patent without evidence of aneurysm, dissection, vasculitis or significant stenosis. Renals: Both renal arteries are patent without evidence of aneurysm, dissection, vasculitis, fibromuscular dysplasia or significant stenosis. IMA: Patent without evidence of aneurysm, dissection, vasculitis or significant stenosis. Inflow: Patent without evidence of aneurysm,  dissection, vasculitis or significant stenosis. Veins: No obvious venous abnormality within the limitations of this arterial phase study. Review of the MIP images confirms the above findings. NON-VASCULAR Hepatobiliary: No focal liver abnormality is seen. No gallstones, gallbladder wall thickening, or biliary dilatation. Pancreas: Unremarkable. No pancreatic ductal dilatation or surrounding inflammatory changes. Spleen: Normal in size without focal abnormality. Adrenals/Urinary Tract: Adrenal glands are unremarkable. Multiple bilateral renal cysts. No calculi or hydronephrosis. Bladder is unremarkable. Stomach/Bowel: Stomach is within normal limits. Appendix appears normal. No evidence of bowel wall thickening, distention, or inflammatory changes. Sigmoid diverticulosis. Large burden of stool in the colon. Lymphatic: No enlarged abdominal or pelvic lymph nodes. Reproductive: Low-attenuation lesions of the bilateral ovaries. Likely small cysts or follicles. Other: No abdominal wall hernia or abnormality. There is a fluid attenuation cystic lesion in the left retroperitoneum or paracolic recess, of uncertain etiology or significance (series 5, image 159). This finding is new compared to CT dated 01/03/2010. No abdominopelvic ascites. Musculoskeletal: No acute or significant osseous findings. Review of the MIP images confirms the above findings. IMPRESSION: 1. Moderate aortic atherosclerosis without evidence of dissection, aneurysm, or other acute aortic pathology. 2.  Coronary artery disease. 3. There is a fluid attenuation cystic lesion in the left retroperitoneum or paracolic recess, of uncertain etiology or significance (series 5, image 159). This finding is new compared to CT dated 01/03/2010. 4.  Other chronic and incidental findings as detailed above. Electronically Signed   By: Lauralyn Primes M.D.   On: 07/02/2018 19:38    EKG: Independently reviewed. Sinus rhythm, non-specific T-wave abnormality appears to be  new.   Assessment/Plan   1. Chest pain; CAD  - Patient has known CAD with stents, now presenting with acute-onset chest pain that resolved with NTG  - There is a new non-specific T-wave abnormality on EKG, troponin is undetectable, and CXR unremarkable  - She had cath in 2018 with no progression of her CAD at that time when compared to 2012  - She took ASA 162 mg prior to arrival  - Continue cardiac monitoring, obtain serial troponin measurements, repeat EKG, continue beta-blocker, statin, and ACE-i   2. Transient left-sided numbness and weakness  - Presents after an episode of chest pain that resolved with NTG and concomitant left-sided numbness and weakness that resolved within ~30 minutes  - Head CT  is unremarkable, there are no focal deficits identified on admission, and patient reports feeling back to normal  - Discussed with neurology, their input much appreciated, ?atypical angina but has RF's for CVA, will check MRI brain but defer additional CVA/TIA workup pending MRI results    3. Hypertension  - BP at goal  - Continue Coreg, lisinopril-HCTZ     PPE: Mask, face shield  DVT prophylaxis: Lovenox  Code Status: Full  Family Communication: Discussed with patient  Consults called: [facility]  Admission status: observation     Briscoe Deutscher, MD [facility] Pager [phone]  If 7PM-7AM, please contact night-coverage [email or web address] Password [email or web address]  07/02/2018, 9:01 PM

## 2018-07-02 NOTE — ED Notes (Signed)
Took 2 ASA and Nitro at home with relief of sx as well.

## 2018-07-02 NOTE — ED Notes (Signed)
Patient transported to CT.   Alert and appropriate.

## 2018-07-02 NOTE — ED Triage Notes (Signed)
Starting at 1130  outside working in yard when she came in , saw spots and L side of body went numb/ arm and leg with CP.  States lasted approx 20 minutes.  Called her cardiologist and PMD and was instructed to come to ED.  Hx of stents cardiac x 3.  No hx of CVA, take baby ASA every day.  Only remaining sx are L arm numbness.  Alert and appropriate.  No pain noted.

## 2018-07-02 NOTE — ED Provider Notes (Signed)
[facility] EMERGENCY DEPARTMENT Provider Note   CSN: [id number] Arrival date & time: 07/02/18  1520    History   Chief Complaint Chief Complaint  Patient presents with   Chest Pain    HPI Kristie Owens is a 63 y.o. female.     HPI  63 year old female with chest pain and left-sided weakness.  She states that around 11:30 AM she came in from being outside and started to not feel well.  She is been having a on and off headache for a couple days but no severe headache.  Noticed sudden onset left arm and leg weakness and numbness.  She could not hold her nitroglycerin bottle in her left hand and could not stand on her left leg.  She was also having simultaneous severe chest pressure.  Was like previous anginal pain but on a different severity scale.  No back pain, abdominal pain, or worsening headache.  No shortness of breath or neck symptoms.  No radiation of the pain.  Nitroglycerin eventually made the pain go away and her weakness went away.  Overall last about 20 or 30 minutes.  No speech deficit or feeling facial numbness. Previous to this has not been ill.  Past Medical History:  Diagnosis Date   CAD (coronary artery disease), native coronary artery    DES to LAD in 2 areas, one at the bifurcation of a diagonal branch and also in the mid to distal region. Her diagonal branch was also ballooned . She has a DES to RCA 10/10. Her circumflex artery mild ostial disease.   Hx of cardiovascular stress test    ETT- Myoview (2/16):  No ischemia, EF 69%, normal study   Hyperlipidemia    Hypertension    Sleep apnea     Patient Active Problem List   Diagnosis Date Noted   Chest pain 07/02/2018   Transient neurologic deficit 07/02/2018   Palpitations 12/15/2013   Angina decubitus (HCC) 12/15/2013   Hyperlipidemia 12/15/2013   Essential hypertension 12/15/2013   Achilles bursitis or tendinitis 11/11/2012   Peroneal tendinitis of left lower  extremity 11/11/2012   Coronary atherosclerosis of native coronary artery 01/16/2011   Intermediate coronary syndrome (HCC) 01/16/2011    Past Surgical History:  Procedure Laterality Date   biospy breast     BREAST EXCISIONAL BIOPSY Right    BREAST EXCISIONAL BIOPSY Right    CAROTID STENT     CESAREAN SECTION     FOOT SURGERY     LEFT HEART CATH AND CORONARY ANGIOGRAPHY N/A 06/04/2016   Procedure: Left Heart Cath and Coronary Angiography;  Surgeon: Lyn Records, MD;  Location: [facility] INVASIVE CV LAB;  Service: Cardiovascular;  Laterality: N/A;   LEFT HEART CATHETERIZATION WITH CORONARY ANGIOGRAM N/A 01/16/2011   Procedure: LEFT HEART CATHETERIZATION WITH CORONARY ANGIOGRAM;  Surgeon: Donato Schultz, MD;  Location: [facility] CATH LAB;  Service: Cardiovascular;  Laterality: N/A;   NOSE SURGERY       OB History   No obstetric history on file.      Home Medications    Prior to Admission medications   Medication Sig Start Date End Date Taking? Authorizing Provider  aspirin EC 81 MG tablet Take 81 mg by mouth daily.     Yes [provider]  carvedilol (COREG) 12.5 MG tablet Take 1 tablet (12.5 mg total) by mouth 2 (two) times daily with a meal. Patient taking differently: Take 6.25 mg by mouth 2 (two) times daily with a meal.  06/26/18  Yes Jake BatheSkains, Mark C, MD  Famotidine (PEPCID AC MAXIMUM STRENGTH) 20 MG CHEW Chew 1 tablet by mouth daily as needed (for heartburn or indigestion).   Yes [provider]  fluocinonide (LIDEX) 0.05 % external solution Apply 1 mL topically at bedtime as needed (for acne flares).  04/28/18  Yes [provider]  fluticasone (FLONASE) 50 MCG/ACT nasal spray Place 2 sprays into both nostrils 2 (two) times daily as needed for allergies or rhinitis.  01/11/14  Yes [provider]  isosorbide mononitrate (IMDUR) 60 MG 24 hr tablet TAKE 1 TABLET BY MOUTH EVERY DAY Patient taking differently: Take 60 mg by mouth daily.  05/05/18  Yes  Jake BatheSkains, Mark C, MD  lisinopril-hydrochlorothiazide (PRINZIDE,ZESTORETIC) 20-12.5 MG tablet TAKE 1 TABLET BY MOUTH EVERY DAY Patient taking differently: Take 1 tablet by mouth daily.  06/11/17  Yes Jake BatheSkains, Mark C, MD  metroNIDAZOLE (METROGEL) 0.75 % gel Apply 1 application topically 2 (two) times daily as needed (for rosacea).  05/03/16  Yes [provider]  montelukast (SINGULAIR) 10 MG tablet Take 10 mg by mouth at bedtime. 04/03/18  Yes [provider]  nitroGLYCERIN (NITROSTAT) 0.4 MG SL tablet Place 1 tablet (0.4 mg total) under the tongue every 5 (five) minutes as needed for chest pain. 03/18/14  Yes Weaver, Laneshia Pina T, PA-C  Olopatadine HCl (PATADAY OP) Place 1 drop into both eyes daily as needed (for itching or dryness).   Yes [provider]  rosuvastatin (CRESTOR) 10 MG tablet Take 1 tablet (10 mg total) by mouth at bedtime. 07/08/17  Yes Jake BatheSkains, Mark C, MD  sertraline (ZOLOFT) 50 MG tablet Take 50 mg by mouth at bedtime.    Yes [provider]  zolpidem (AMBIEN) 10 MG tablet Take 10 mg by mouth at bedtime.  03/12/14  Yes [provider]    Family History Family History  Problem Relation Age of Onset   Hypertension Mother    Heart disease Mother    COPD Mother    Ulcers Mother    Cancer Father    Heart failure Paternal Grandmother    Heart attack Paternal Grandmother    Hypertension Paternal Grandmother    Hyperlipidemia Paternal Grandmother    Heart attack Brother    Asthma Brother    Hypertension Sister    Stroke Neg Hx     Social History Social History   Tobacco Use   Smoking status: Never Smoker   Smokeless tobacco: Never Used  Substance Use Topics   Alcohol use: Yes    Comment: rarely   Drug use: Not on file     Allergies   Sulfa antibiotics and Prednisone   Review of Systems Review of Systems  Respiratory: Negative for shortness of breath.   Cardiovascular: Positive for chest pain.  Gastrointestinal:  Negative for vomiting.  Musculoskeletal: Negative for back pain.  Neurological: Positive for weakness, numbness and headaches.  All other systems reviewed and are negative.    Physical Exam Updated Vital Signs BP (!) 157/80 (BP Location: Right Arm)    Pulse 78    Temp 98.5 F (36.9 C) (Oral)    Resp 20    Ht 5\' 4"  (1.626 m)    Wt 88.4 kg    SpO2 96%    BMI 33.44 kg/m   Physical Exam Vitals signs and nursing note reviewed.  Constitutional:      General: She is not in acute distress.    Appearance: She is well-developed. She is  not ill-appearing or diaphoretic.  HENT:     Head: Normocephalic and atraumatic.     Right Ear: External ear normal.     Left Ear: External ear normal.     Nose: Nose normal.  Eyes:     General:        Right eye: No discharge.        Left eye: No discharge.  Cardiovascular:     Rate and Rhythm: Normal rate and regular rhythm.     Pulses:          Radial pulses are 2+ on the right side and 2+ on the left side.       Dorsalis pedis pulses are 2+ on the right side and 2+ on the left side.     Heart sounds: Normal heart sounds.  Pulmonary:     Effort: Pulmonary effort is normal.     Breath sounds: Normal breath sounds.  Abdominal:     Palpations: Abdomen is soft.     Tenderness: There is no abdominal tenderness.  Skin:    General: Skin is warm and dry.  Neurological:     Mental Status: She is alert.     Comments: CN 3-12 grossly intact. 5/5 strength in all 4 extremities. Grossly normal sensation. Normal finger to nose.   Psychiatric:        Mood and Affect: Mood is not anxious.      ED Treatments / Results  Labs (all labs ordered are listed, but only abnormal results are displayed) Labs Reviewed  CBC - Abnormal; Notable for the following components:      Result Value   WBC 3.8 (*)    All other components within normal limits  DIFFERENTIAL - Abnormal; Notable for the following components:   Neutro Abs 1.1 (*)    All other components within  normal limits  COMPREHENSIVE METABOLIC PANEL - Abnormal; Notable for the following components:   Glucose, Bld 143 (*)    All other components within normal limits  URINALYSIS, ROUTINE W REFLEX MICROSCOPIC - Abnormal; Notable for the following components:   Color, Urine STRAW (*)    All other components within normal limits  I-STAT CHEM 8, ED - Abnormal; Notable for the following components:   Glucose, Bld 142 (*)    All other components within normal limits  SARS CORONAVIRUS 2 (HOSPITAL ORDER, PERFORMED IN [facility])  PROTIME-INR  APTT  RAPID URINE DRUG SCREEN, HOSP PERFORMED  TROPONIN I  TROPONIN I  HIV ANTIBODY (ROUTINE TESTING W REFLEX)  BASIC METABOLIC PANEL  TROPONIN I  TROPONIN I  CBC WITH DIFFERENTIAL/PLATELET    EKG EKG Interpretation  Date/Time:  Wednesday July 02 2018 15:43:01 EDT Ventricular Rate:  75 PR Interval:    QRS Duration: 80 QT Interval:  364 QTC Calculation: 407 R Axis:   7 Text Interpretation:  Sinus rhythm Baseline wander in lead(s) V2 nonspecific T waves. Confirmed by Pricilla Loveless [phone]) on 07/02/2018 3:46:45 PM   Radiology Ct Head Wo Contrast  Result Date: 07/02/2018 CLINICAL DATA:  63 year old female with left-sided numbness and TIA EXAM: CT HEAD WITHOUT CONTRAST TECHNIQUE: Contiguous axial images were obtained from the base of the skull through the vertex without intravenous contrast. COMPARISON:  None. FINDINGS: Brain: The ventricles and sulci appropriate size for patient's age. The gray-white matter discrimination is preserved. There is no acute intracranial hemorrhage. No mass effect or midline shift. No extra-axial fluid collection. Vascular: No hyperdense vessel or unexpected calcification. Skull:  Normal. Negative for fracture or focal lesion. There is hyperostosis frontalis. Sinuses/Orbits: No acute finding. Other: None IMPRESSION: Unremarkable noncontrast CT of the brain. Electronically Signed   By: Elgie Collard M.D.   On:  07/02/2018 19:33   Dg Chest Portable 1 View  Result Date: 07/02/2018 CLINICAL DATA:  Right-sided weakness and headache EXAM: PORTABLE CHEST 1 VIEW COMPARISON:  November 23, 2008 FINDINGS: Lungs are clear. Heart size and pulmonary vascularity are normal. No adenopathy. No evident bone lesions. IMPRESSION: No edema or consolidation.  Heart size within normal limits. Electronically Signed   By: Bretta Bang III M.D.   On: 07/02/2018 16:14   Ct Angio Chest/abd/pel For Dissection W And/or Wo Contrast  Result Date: 07/02/2018 CLINICAL DATA:  Left-sided numbness, evaluate for aortic dissection EXAM: CT ANGIOGRAPHY CHEST, ABDOMEN AND PELVIS TECHNIQUE: Multidetector CT imaging through the chest, abdomen and pelvis was performed using the standard protocol during bolus administration of intravenous contrast. Multiplanar reconstructed images and MIPs were obtained and reviewed to evaluate the vascular anatomy. CONTRAST:  OMNIPAQUE IOHEXOL 350 MG/ML SOLN COMPARISON:  01/03/2010 FINDINGS: CTA CHEST FINDINGS Cardiovascular: Preferential opacification of the thoracic aorta. No evidence of thoracic aortic aneurysm or dissection. Normal heart size. Coronary artery calcifications and stents. No pericardial effusion. Mediastinum/Nodes: No enlarged mediastinal, hilar, or axillary lymph nodes. Thyroid gland, trachea, and esophagus demonstrate no significant findings. Lungs/Pleura: Lungs are clear. No pleural effusion or pneumothorax. Musculoskeletal: No chest wall abnormality. No acute or significant osseous findings. Review of the MIP images confirms the above findings. CTA ABDOMEN AND PELVIS FINDINGS VASCULAR Aorta: Moderate mixed calcific atherosclerosis. Normal caliber aorta without aneurysm, dissection, vasculitis or significant stenosis. Celiac: Patent without evidence of aneurysm, dissection, vasculitis or significant stenosis. SMA: Patent without evidence of aneurysm, dissection, vasculitis or significant  stenosis. Renals: Both renal arteries are patent without evidence of aneurysm, dissection, vasculitis, fibromuscular dysplasia or significant stenosis. IMA: Patent without evidence of aneurysm, dissection, vasculitis or significant stenosis. Inflow: Patent without evidence of aneurysm, dissection, vasculitis or significant stenosis. Veins: No obvious venous abnormality within the limitations of this arterial phase study. Review of the MIP images confirms the above findings. NON-VASCULAR Hepatobiliary: No focal liver abnormality is seen. No gallstones, gallbladder wall thickening, or biliary dilatation. Pancreas: Unremarkable. No pancreatic ductal dilatation or surrounding inflammatory changes. Spleen: Normal in size without focal abnormality. Adrenals/Urinary Tract: Adrenal glands are unremarkable. Multiple bilateral renal cysts. No calculi or hydronephrosis. Bladder is unremarkable. Stomach/Bowel: Stomach is within normal limits. Appendix appears normal. No evidence of bowel wall thickening, distention, or inflammatory changes. Sigmoid diverticulosis. Large burden of stool in the colon. Lymphatic: No enlarged abdominal or pelvic lymph nodes. Reproductive: Low-attenuation lesions of the bilateral ovaries. Likely small cysts or follicles. Other: No abdominal wall hernia or abnormality. There is a fluid attenuation cystic lesion in the left retroperitoneum or paracolic recess, of uncertain etiology or significance (series 5, image 159). This finding is new compared to CT dated 01/03/2010. No abdominopelvic ascites. Musculoskeletal: No acute or significant osseous findings. Review of the MIP images confirms the above findings. IMPRESSION: 1. Moderate aortic atherosclerosis without evidence of dissection, aneurysm, or other acute aortic pathology. 2.  Coronary artery disease. 3. There is a fluid attenuation cystic lesion in the left retroperitoneum or paracolic recess, of uncertain etiology or significance (series 5,  image 159). This finding is new compared to CT dated 01/03/2010. 4.  Other chronic and incidental findings as detailed above. Electronically Signed   By: Erasmo Score.D.  On: 07/02/2018 19:38    Procedures Procedures (including critical care time)  Medications Ordered in ED Medications  aspirin EC tablet 81 mg (has no administration in time range)  isosorbide mononitrate (IMDUR) 24 hr tablet 60 mg (has no administration in time range)  rosuvastatin (CRESTOR) tablet 10 mg (10 mg Oral Given 07/02/18 2311)  sertraline (ZOLOFT) tablet 50 mg (50 mg Oral Given 07/02/18 2311)  zolpidem (AMBIEN) tablet 5 mg (5 mg Oral Given 07/02/18 2311)  nitroGLYCERIN (NITROSTAT) SL tablet 0.4 mg (has no administration in time range)  acetaminophen (TYLENOL) tablet 650 mg (has no administration in time range)  ondansetron (ZOFRAN) injection 4 mg (has no administration in time range)  enoxaparin (LOVENOX) injection 40 mg (has no administration in time range)  famotidine (PEPCID) tablet 20 mg (has no administration in time range)  montelukast (SINGULAIR) tablet 10 mg (10 mg Oral Given 07/02/18 2311)  lisinopril (ZESTRIL) tablet 20 mg (has no administration in time range)    And  hydrochlorothiazide (MICROZIDE) capsule 12.5 mg (has no administration in time range)  carvedilol (COREG) tablet 6.25 mg (has no administration in time range)  iohexol (OMNIPAQUE) 350 MG/ML injection 100 mL (100 mLs Intravenous Contrast Given 07/02/18 1914)     Initial Impression / Assessment and Plan / ED Course  I have reviewed the triage vital signs and the nursing notes.  Pertinent labs & imaging results that were available during my care of the patient were reviewed by me and considered in my medical decision making (see chart for details).  Clinical Course as of Jul 02 2318  Wed Jul 02, 2018  1557 Patient has no symptoms including no chest pain or weakness/numbness in her left extremity.  Could be TIA but with the severe chest  pressure, will need to rule out dissection.  Will get CT head but also CT dissection protocol.   [SG]    Clinical Course User Index [SG] Pricilla Loveless, MD       Patient's symptoms have remained resolved and have not recurred.  Given the chest pain with the weakness, angiography obtained but no dissection.  Unclear if this chest pain was truly the cause but given the left leg was involved there is concern for possible TIA.  She will be admitted for further work-up.  Dr. Antionette Char to admit.  Kristie Owens was evaluated in Emergency Department on 07/02/2018 for the symptoms described in the history of present illness. She was evaluated in the context of the global COVID-19 pandemic, which necessitated consideration that the patient might be at risk for infection with the SARS-CoV-2 virus that causes COVID-19. Institutional protocols and algorithms that pertain to the evaluation of patients at risk for COVID-19 are in a state of rapid change based on information released by regulatory bodies including the CDC and federal and state organizations. These policies and algorithms were followed during the patient's care in the ED.  Final Clinical Impressions(s) / ED Diagnoses   Final diagnoses:  Nonspecific chest pain    ED Discharge Orders    None       Pricilla Loveless, MD 07/02/18 2321

## 2018-07-02 NOTE — Telephone Encounter (Signed)
Called patient and advised her that symptoms are concerning for a stroke and that she should be seen in the ED.  She answers questions clearly and states she is now able to use the left side  States she was outside cleaning her patio and when she came in she saw spots and then starting having trouble walking. States she had to drag herself to her purse to get her NTG.  States symptoms were relieved after taking two. Patient states she is able to walk without difficulty now and admits this has not occurred in the past. I asked if anyone is there with her and she states her husband is there. I asked if he has noticed anything unusual about her appearance and she denies but states he was not home when she was unable to walk. I advised again that her symptoms are concerning and my advice is to go to the ED either by calling EMS or having her husband drive her.

## 2018-07-03 ENCOUNTER — Telehealth: Payer: Self-pay | Admitting: Cardiology

## 2018-07-03 ENCOUNTER — Observation Stay (HOSPITAL_COMMUNITY): Payer: Managed Care, Other (non HMO)

## 2018-07-03 DIAGNOSIS — R079 Chest pain, unspecified: Secondary | ICD-10-CM

## 2018-07-03 LAB — BASIC METABOLIC PANEL
Anion gap: 11 (ref 5–15)
BUN: 13 mg/dL (ref 8–23)
CO2: 24 mmol/L (ref 22–32)
Calcium: 9.4 mg/dL (ref 8.9–10.3)
Chloride: 106 mmol/L (ref 98–111)
Creatinine, Ser: 0.78 mg/dL (ref 0.44–1.00)
GFR calc Af Amer: >60 mL/min (ref >60)
GFR calc non Af Amer: >60 mL/min (ref >60)
Glucose, Bld: 82 mg/dL (ref 70–99)
Potassium: 4 mmol/L (ref 3.5–5.1)
Sodium: 141 mmol/L (ref 135–145)

## 2018-07-03 LAB — TROPONIN I
Troponin I: <0.03 ng/mL (ref <0.03)
Troponin I: <0.03 ng/mL (ref <0.03)

## 2018-07-03 LAB — HIV ANTIBODY (ROUTINE TESTING W REFLEX): HIV Screen 4th Generation wRfx: NONREACTIVE

## 2018-07-03 LAB — CBC WITH DIFFERENTIAL/PLATELET
Abs Immature Granulocytes: 0.01 10*3/uL (ref 0.00–0.07)
Basophils Absolute: 0 10*3/uL (ref 0.0–0.1)
Basophils Relative: 1 %
Eosinophils Absolute: 0.1 10*3/uL (ref 0.0–0.5)
Eosinophils Relative: 2 %
HCT: 39.6 % (ref 36.0–46.0)
Hemoglobin: 12.9 g/dL (ref 12.0–15.0)
Immature Granulocytes: 0 %
Lymphocytes Relative: 58 %
Lymphs Abs: 2.8 10*3/uL (ref 0.7–4.0)
MCH: 27.1 pg (ref 26.0–34.0)
MCHC: 32.6 g/dL (ref 30.0–36.0)
MCV: 83.2 fL (ref 80.0–100.0)
Monocytes Absolute: 0.8 10*3/uL (ref 0.1–1.0)
Monocytes Relative: 17 %
Neutro Abs: 1 10*3/uL — ABNORMAL LOW (ref 1.7–7.7)
Neutrophils Relative %: 22 %
Platelets: 318 10*3/uL (ref 150–400)
RBC: 4.76 MIL/uL (ref 3.87–5.11)
RDW: 14.9 % (ref 11.5–15.5)
WBC: 4.8 10*3/uL (ref 4.0–10.5)
nRBC: 0 % (ref 0.0–0.2)

## 2018-07-03 MED ORDER — CARVEDILOL 12.5 MG PO TABS: 6.2500 mg | ORAL_TABLET | Freq: Two times a day (BID) | ORAL | Status: DC

## 2018-07-03 NOTE — Discharge Instructions (Signed)
Be sure to stay hydrated especially when working outside

## 2018-07-03 NOTE — Telephone Encounter (Signed)
New Message:    Pt is being discharged from the hospital. Today. She needs a follow up visit with Dr Anne Fu. She does not want a Virtual Visit, she wants to come in the office for her apptj

## 2018-07-03 NOTE — Plan of Care (Signed)

## 2018-07-03 NOTE — Telephone Encounter (Signed)
Agree.  Thank you.  She did go to the emergency department and is currently admitted. Donato Schultz, MD

## 2018-07-03 NOTE — Discharge Summary (Signed)
Physician Discharge Summary  Kristie DecemberDeborah Dunlap Oregel ZOX:[id number] DOB: 11/04/1955 DOA: 07/02/2018  PCP: Lenell AntuLe, Thao P, DO  Admit date: 07/02/2018 Discharge date: 07/03/2018  Time spent: 45 minutes  Recommendations for Outpatient Follow-up:  1. Follow up with primary cardiology 1-2 weeks for evaluation of symtoms 2. Follow up with PCP 1-2 weeks for evaluation of BP control 3. Stay hydrated   Discharge Diagnoses:  Principal Problem:   Chest pain Active Problems:   Coronary atherosclerosis of native coronary artery   Essential hypertension   Transient neurologic deficit   Discharge Condition: stable  Diet recommendation: heart healthy  Filed Weights   07/02/18 1542 07/02/18 2249  Weight: 87.1 kg 88.4 kg    History of present illness:  Kristie Owens is a very pleasant 63 y.o. female with medical history significant for coronary artery disease with stents, hypertension, and hyperlipidemia, presented to ED 6/3 after an episode of chest pain and left-sided numbness and weakness.  Patient reported that she been in her usual state of health and was washing some outdoor furniture when she developed acute onset of chest pain similar to her prior experiences with angina.  She also developed some lightheadedness at about the same time and noted numbness and weakness involving her left arm and leg, describing that she drag the left foot when going inside to get her nitroglycerin, then had difficulty holding the nitroglycerin bottle with her left hand.  Chest pain resolved with nitroglycerin and the numbness and weakness seem to completely resolve within approximately 30 minutes.  She had never experienced the neurologic symptoms previously.  She also took 2 baby aspirin before coming in.  No recent fevers, chills, shortness of breath, or cough.  She denied lower extremity swelling or tenderness.  Hospital Course:  1. Chest pain; CAD  Patient has known CAD with stents, with acute-onset chest pain  that resolved with NTG  There is a new non-specific T-wave abnormality on EKG, troponin negative x3.  CXR unremarkable. No events on tele. She had cath in 2018 with no progression of her CAD at that time when compared to 2012. Symptoms resolved quickly with no recurrence. May be related to BP control in setting of dehydration after working outdoors in heat. Recommend she maintain hydration and follow up with primary cardiologist 1-2 weeks for evaluation of symptoms   2. Transient left-sided numbness and weakness  Presented after an episode of chest pain that resolved with NTG and concomitant left-sided numbness and weakness that resolved within ~30 minutes  Head CT is unremarkable, there are no focal deficits identified on admission, and patient reports feeling back to normal. MRI brain without acute abnormality.    3. Hypertension BP high end of normal during hospitalization. Home meds include coreg, lisinopril and HCTZ. Resume home meds and recommended maintaining hydration. Advised to monitor BP at home and share reading with MD at follow up visit     Procedures:  Consultations:    Discharge Exam: Vitals:   07/03/18 0432 07/03/18 0823  BP: (!) 153/68 139/65  Pulse: (!) 59 62  Resp: 18 18  Temp: 98.4 F (36.9 C) 98.3 F (36.8 C)  SpO2: 94% 97%    General: awake alert no acute distress Cardiovascular: RRR no mgr no LE edema Respiratory: normal effort BS clear bilaterally no wheeze  Discharge Instructions   Discharge Instructions    Diet - low sodium heart healthy   Complete by:  As directed    Increase activity slowly   Complete by:  As directed      Allergies as of 07/03/2018      Reactions   Sulfa Antibiotics Other (See Comments)   From childhood- reaction not recalled   Prednisone Anxiety, Other (See Comments)   "Made me not feel like myself, altered"      Medication List    TAKE these medications   aspirin EC 81 MG tablet Take 81 mg by mouth daily. Notes to  patient:  June 5   carvedilol 12.5 MG tablet Commonly known as:  COREG Take 0.5 tablets (6.25 mg total) by mouth 2 (two) times daily with a meal. Notes to patient:  June 4   fluocinonide 0.05 % external solution Commonly known as:  LIDEX Apply 1 mL topically at bedtime as needed (for acne flares).   fluticasone 50 MCG/ACT nasal spray Commonly known as:  FLONASE Place 2 sprays into both nostrils 2 (two) times daily as needed for allergies or rhinitis.   isosorbide mononitrate 60 MG 24 hr tablet Commonly known as:  IMDUR TAKE 1 TABLET BY MOUTH EVERY DAY Notes to patient:  June 5   lisinopril-hydrochlorothiazide 20-12.5 MG tablet Commonly known as:  ZESTORETIC TAKE 1 TABLET BY MOUTH EVERY DAY Notes to patient:  June 5   metroNIDAZOLE 0.75 % gel Commonly known as:  METROGEL Apply 1 application topically 2 (two) times daily as needed (for rosacea).   montelukast 10 MG tablet Commonly known as:  SINGULAIR Take 10 mg by mouth at bedtime. Notes to patient:  June 4   nitroGLYCERIN 0.4 MG SL tablet Commonly known as:  NITROSTAT Place 1 tablet (0.4 mg total) under the tongue every 5 (five) minutes as needed for chest pain.   PATADAY OP Place 1 drop into both eyes daily as needed (for itching or dryness).   Pepcid AC Maximum Strength 20 MG Chew Generic drug:  Famotidine Chew 1 tablet by mouth daily as needed (for heartburn or indigestion).   rosuvastatin 10 MG tablet Commonly known as:  Crestor Take 1 tablet (10 mg total) by mouth at bedtime. Notes to patient:  June 4   sertraline 50 MG tablet Commonly known as:  ZOLOFT Take 50 mg by mouth at bedtime. Notes to patient:  June 4   zolpidem 10 MG tablet Commonly known as:  AMBIEN Take 10 mg by mouth at bedtime. Notes to patient:  June 4      Allergies  Allergen Reactions  . Sulfa Antibiotics Other (See Comments)    From childhood- reaction not recalled  . Prednisone Anxiety and Other (See Comments)    "Made me not  feel like myself, altered"   Follow-up Information    Lenell Antu, DO On 07/10/2018.   Specialty:  Family Medicine Why:  Thursday June 11th @ 1:15 pm Dr. Baxter Hire is the PA for this patient  Contact information: [address] [place] [address] [phone]            The results of significant diagnostics from this hospitalization (including imaging, microbiology, ancillary and laboratory) are listed below for reference.    Significant Diagnostic Studies: Ct Head Wo Contrast  Result Date: 07/02/2018 CLINICAL DATA:  63 year old female with left-sided numbness and TIA EXAM: CT HEAD WITHOUT CONTRAST TECHNIQUE: Contiguous axial images were obtained from the base of the skull through the vertex without intravenous contrast. COMPARISON:  None. FINDINGS: Brain: The ventricles and sulci appropriate size for patient's age. The gray-white matter discrimination is preserved. There is no acute  intracranial hemorrhage. No mass effect or midline shift. No extra-axial fluid collection. Vascular: No hyperdense vessel or unexpected calcification. Skull: Normal. Negative for fracture or focal lesion. There is hyperostosis frontalis. Sinuses/Orbits: No acute finding. Other: None IMPRESSION: Unremarkable noncontrast CT of the brain. Electronically Signed   By: Elgie Collard M.D.   On: 07/02/2018 19:33   Mr Brain Wo Contrast  Result Date: 07/03/2018 CLINICAL DATA:  Ataxia with stroke suspected EXAM: MRI HEAD WITHOUT CONTRAST TECHNIQUE: Multiplanar, multiecho pulse sequences of the brain and surrounding structures were obtained without intravenous contrast. COMPARISON:  Head CT from yesterday FINDINGS: Brain: No acute infarction, hemorrhage, hydrocephalus, extra-axial collection or mass lesion. No atrophy or significant white matter signal abnormality. Vascular: Preserved flow voids, including vertebrobasilar. Skull and upper cervical spine: Hyperostosis interna. No focal marrow lesion Sinuses/Orbits:  Negative. The mastoid and middle ear spaces are clear. IMPRESSION: Negative brain MRI.  No explanation for dizziness. Electronically Signed   By: Marnee Spring M.D.   On: 07/03/2018 06:12   Dg Chest Portable 1 View  Result Date: 07/02/2018 CLINICAL DATA:  Right-sided weakness and headache EXAM: PORTABLE CHEST 1 VIEW COMPARISON:  November 23, 2008 FINDINGS: Lungs are clear. Heart size and pulmonary vascularity are normal. No adenopathy. No evident bone lesions. IMPRESSION: No edema or consolidation.  Heart size within normal limits. Electronically Signed   By: Bretta Bang III M.D.   On: 07/02/2018 16:14   Ct Angio Chest/abd/pel For Dissection W And/or Wo Contrast  Result Date: 07/02/2018 CLINICAL DATA:  Left-sided numbness, evaluate for aortic dissection EXAM: CT ANGIOGRAPHY CHEST, ABDOMEN AND PELVIS TECHNIQUE: Multidetector CT imaging through the chest, abdomen and pelvis was performed using the standard protocol during bolus administration of intravenous contrast. Multiplanar reconstructed images and MIPs were obtained and reviewed to evaluate the vascular anatomy. CONTRAST:  OMNIPAQUE IOHEXOL 350 MG/ML SOLN COMPARISON:  01/03/2010 FINDINGS: CTA CHEST FINDINGS Cardiovascular: Preferential opacification of the thoracic aorta. No evidence of thoracic aortic aneurysm or dissection. Normal heart size. Coronary artery calcifications and stents. No pericardial effusion. Mediastinum/Nodes: No enlarged mediastinal, hilar, or axillary lymph nodes. Thyroid gland, trachea, and esophagus demonstrate no significant findings. Lungs/Pleura: Lungs are clear. No pleural effusion or pneumothorax. Musculoskeletal: No chest wall abnormality. No acute or significant osseous findings. Review of the MIP images confirms the above findings. CTA ABDOMEN AND PELVIS FINDINGS VASCULAR Aorta: Moderate mixed calcific atherosclerosis. Normal caliber aorta without aneurysm, dissection, vasculitis or significant stenosis. Celiac:  Patent without evidence of aneurysm, dissection, vasculitis or significant stenosis. SMA: Patent without evidence of aneurysm, dissection, vasculitis or significant stenosis. Renals: Both renal arteries are patent without evidence of aneurysm, dissection, vasculitis, fibromuscular dysplasia or significant stenosis. IMA: Patent without evidence of aneurysm, dissection, vasculitis or significant stenosis. Inflow: Patent without evidence of aneurysm, dissection, vasculitis or significant stenosis. Veins: No obvious venous abnormality within the limitations of this arterial phase study. Review of the MIP images confirms the above findings. NON-VASCULAR Hepatobiliary: No focal liver abnormality is seen. No gallstones, gallbladder wall thickening, or biliary dilatation. Pancreas: Unremarkable. No pancreatic ductal dilatation or surrounding inflammatory changes. Spleen: Normal in size without focal abnormality. Adrenals/Urinary Tract: Adrenal glands are unremarkable. Multiple bilateral renal cysts. No calculi or hydronephrosis. Bladder is unremarkable. Stomach/Bowel: Stomach is within normal limits. Appendix appears normal. No evidence of bowel wall thickening, distention, or inflammatory changes. Sigmoid diverticulosis. Large burden of stool in the colon. Lymphatic: No enlarged abdominal or pelvic lymph nodes. Reproductive: Low-attenuation lesions of the bilateral ovaries. Likely small cysts  or follicles. Other: No abdominal wall hernia or abnormality. There is a fluid attenuation cystic lesion in the left retroperitoneum or paracolic recess, of uncertain etiology or significance (series 5, image 159). This finding is new compared to CT dated 01/03/2010. No abdominopelvic ascites. Musculoskeletal: No acute or significant osseous findings. Review of the MIP images confirms the above findings. IMPRESSION: 1. Moderate aortic atherosclerosis without evidence of dissection, aneurysm, or other acute aortic pathology. 2.   Coronary artery disease. 3. There is a fluid attenuation cystic lesion in the left retroperitoneum or paracolic recess, of uncertain etiology or significance (series 5, image 159). This finding is new compared to CT dated 01/03/2010. 4.  Other chronic and incidental findings as detailed above. Electronically Signed   By: Lauralyn Primes M.D.   On: 07/02/2018 19:38    Microbiology: Recent Results (from the past 240 hour(s))  SARS Coronavirus 2 (CEPHEID - Performed in [facility] hospital lab), Hosp Order     Status: None   Collection Time: 07/02/18  8:10 PM  Result Value Ref Range Status   SARS Coronavirus 2 NEGATIVE NEGATIVE Final    Comment: (NOTE) If result is NEGATIVE SARS-CoV-2 target nucleic acids are NOT DETECTED. The SARS-CoV-2 RNA is generally detectable in upper and lower  respiratory specimens during the acute phase of infection. The lowest  concentration of SARS-CoV-2 viral copies this assay can detect is 250  copies / mL. A negative result does not preclude SARS-CoV-2 infection  and should not be used as the sole basis for treatment or other  patient management decisions.  A negative result may occur with  improper specimen collection / handling, submission of specimen other  than nasopharyngeal swab, presence of viral mutation(s) within the  areas targeted by this assay, and inadequate number of viral copies  (<250 copies / mL). A negative result must be combined with clinical  observations, patient history, and epidemiological information. If result is POSITIVE SARS-CoV-2 target nucleic acids are DETECTED. The SARS-CoV-2 RNA is generally detectable in upper and lower  respiratory specimens dur ing the acute phase of infection.  Positive  results are indicative of active infection with SARS-CoV-2.  Clinical  correlation with patient history and other diagnostic information is  necessary to determine patient infection status.  Positive results do  not rule out bacterial  infection or co-infection with other viruses. If result is PRESUMPTIVE POSTIVE SARS-CoV-2 nucleic acids MAY BE PRESENT.   A presumptive positive result was obtained on the submitted specimen  and confirmed on repeat testing.  While 2019 novel coronavirus  (SARS-CoV-2) nucleic acids may be present in the submitted sample  additional confirmatory testing may be necessary for epidemiological  and / or clinical management purposes  to differentiate between  SARS-CoV-2 and other Sarbecovirus currently known to infect humans.  If clinically indicated additional testing with an alternate test  methodology [id number]) is advised. The SARS-CoV-2 RNA is generally  detectable in upper and lower respiratory sp ecimens during the acute  phase of infection. The expected result is Negative. Fact Sheet for Patients:  BoilerBrush.com.cy Fact Sheet for Healthcare Providers: https://pope.com/ This test is not yet approved or cleared by the [place] FDA and has been authorized for detection and/or diagnosis of SARS-CoV-2 by FDA under an Emergency Use Authorization (EUA).  This EUA will remain in effect (meaning this test can be used) for the duration of the COVID-19 declaration under Section 564(b)(1) of the Act, 21 U.S.C. section 360bbb-3(b)(1), unless the authorization is terminated or  revoked sooner. Performed at [facility], [address], [place], [place] [address]      Labs: Basic Metabolic Panel: Recent Labs  Lab 07/02/18 1556 07/02/18 1606 07/03/18 0323  NA 139 141 141  K 3.7 3.8 4.0  CL 105 105 106  CO2 23  --  24  GLUCOSE 143* 142* 82  BUN CREATININE 0.80 0.70 0.78  CALCIUM 9.3  --  9.4   Liver Function Tests: Recent Labs  Lab 07/02/18 1556  AST 32  ALT 25  ALKPHOS 84  BILITOT 0.3  PROT 8.0  ALBUMIN 3.8   No results for input(s): LIPASE, AMYLASE in the last 168 hours. No results for input(s): AMMONIA  in the last 168 hours. CBC: Recent Labs  Lab 07/02/18 1556 07/02/18 1606 07/03/18 0323  WBC 3.8*  --  4.8  NEUTROABS 1.1*  --  1.0*  HGB 12.8 12.9 12.9  HCT 39.5 38.0 39.6  MCV 83.9  --  83.2  PLT 347  --  318   Cardiac Enzymes: Recent Labs  Lab 07/02/18 1556 07/02/18 2123 07/03/18 0323 07/03/18 0838  TROPONINI <0.03 <0.03 <0.03 <0.03   BNP: BNP (last 3 results) No results for input(s): BNP in the last 8760 hours.  ProBNP (last 3 results) No results for input(s): PROBNP in the last 8760 hours.  CBG: No results for input(s): GLUCAP in the last 168 hours.     SignedGwenyth Bender NP [facility] 07/03/2018, 1:40 PM

## 2018-07-25 ENCOUNTER — Telehealth: Payer: Self-pay | Admitting: Cardiology

## 2018-07-25 NOTE — Telephone Encounter (Signed)
Patient returned your call.

## 2018-07-25 NOTE — Telephone Encounter (Signed)
New Message ° ° ° ° °Called and left voicemail to confirm appt and answer COVID questions  °

## 2018-07-25 NOTE — Telephone Encounter (Signed)
Follow up ° ° ° ° °COVID SCREENING QUESTIONS FOR IN-OFFICE VISITS - PLEASE DOCUMENT PATIENT ANSWERS ° °1. Do you currently have a fever? ° °a. NO ° °2. Have you recently traveled on a cruise, internationally, or to [place], [place], [place], [place], [place], or [place], [place] ([employer])? ° °a. NO ° °3. Have you been in contact with someone that is currently pending confirmation of Covid19 testing or has been confirmed to have the Covid19 virus? ° °a. NO ° °4. Are you currently experiencing fatigue or cough? ° °a. NO ° ° °

## 2018-07-28 ENCOUNTER — Telehealth: Payer: Self-pay

## 2018-07-28 ENCOUNTER — Telehealth: Payer: Managed Care, Other (non HMO) | Admitting: Cardiology

## 2018-07-28 ENCOUNTER — Other Ambulatory Visit: Payer: Self-pay

## 2018-07-28 ENCOUNTER — Encounter: Payer: Self-pay | Admitting: Cardiology

## 2018-07-28 ENCOUNTER — Telehealth: Payer: Self-pay | Admitting: Cardiology

## 2018-07-28 ENCOUNTER — Telehealth (INDEPENDENT_AMBULATORY_CARE_PROVIDER_SITE_OTHER): Payer: Managed Care, Other (non HMO) | Admitting: Cardiology

## 2018-07-28 VITALS — BP 148/79 | HR 78 | Ht 64.0 in | Wt 197.0 lb

## 2018-07-28 DIAGNOSIS — E782 Mixed hyperlipidemia: Secondary | ICD-10-CM | POA: Diagnosis not present

## 2018-07-28 DIAGNOSIS — I208 Other forms of angina pectoris: Secondary | ICD-10-CM | POA: Diagnosis not present

## 2018-07-28 DIAGNOSIS — I10 Essential (primary) hypertension: Secondary | ICD-10-CM | POA: Diagnosis not present

## 2018-07-28 DIAGNOSIS — I2089 Other forms of angina pectoris: Secondary | ICD-10-CM

## 2018-07-28 DIAGNOSIS — I2510 Atherosclerotic heart disease of native coronary artery without angina pectoris: Secondary | ICD-10-CM | POA: Diagnosis not present

## 2018-07-28 MED ORDER — ISOSORBIDE MONONITRATE ER 60 MG PO TB24: 90.0000 mg | ORAL_TABLET | Freq: Every day | ORAL | 3 refills | Status: DC

## 2018-07-28 NOTE — Patient Instructions (Signed)
Medication Instructions:  Increase Isosorbide Mononitrate (Imdur) to 90 mg Daily If you need a refill on your cardiac medications before your next appointment, please call your pharmacy.   Lab work: none If you have labs (blood work) drawn today and your tests are completely normal, you will receive your results only by: [name] MyChart Message (if you have MyChart) OR . A paper copy in the mail If you have any lab test that is abnormal or we need to change your treatment, we will call you to review the results.  Testing/Procedures: none  Follow-Up: 4-6 weeks with Dr Marlou Porch or Cecilie Kicks (Someone will call to schedule) At [facility], you and your health needs are our priority.  As part of our continuing mission to provide you with exceptional heart care, we have created designated Provider Care Teams.  These Care Teams include your primary Cardiologist (physician) and Advanced Practice Providers (APPs -  Physician Assistants and Nurse Practitioners) who all work together to provide you with the care you need, when you need it. .   Any Other Special Instructions Will Be Listed Below (If Applicable).

## 2018-07-28 NOTE — Progress Notes (Signed)
Virtual Visit via Video Note   This visit type was conducted due to national recommendations for restrictions regarding the COVID-19 Pandemic (e.g. social distancing) in an effort to limit this patient's exposure and mitigate transmission in our community.  Due to her co-morbid illnesses, this patient is at least at moderate risk for complications without adequate follow up.  This format is felt to be most appropriate for this patient at this time.  All issues noted in this document were discussed and addressed.  A limited physical exam was performed with this format.  Please refer to the patient's chart for her consent to telehealth for [facility].   Date:  07/28/2018   ID:  Kristie Decembereborah Dunlap Owens, DOB 03/17/1955, MRN [id number]  Patient Location: Home Provider Location: Office  PCP:  Heide ScalesNelson, Kristen M, PA-C  Cardiologist:  Donato SchultzMark Skains, MD  Electrophysiologist:  None   Evaluation Performed:  Follow-Up Visit  Chief Complaint:  Post hospitlaiztaion.   History of Present Illness:    Kristie Owens is a 63 y.o. female with recent hospitalization with chest pain.     She has a medical history significant forcoronary artery disease with stents, hypertension, and hyperlipidemia, presented to ED 6/3 after an episode of chest pain and left-sided numbness and weakness. Patient reported that she been in her usual state of health and was washing some outdoor furniture when she developed acute onset of chest pain similar to her prior experiences with angina. She also developed some lightheadedness at about the same time and noted numbness and weakness involving her left arm and leg, describing that she drag the left foot when going inside to get her nitroglycerin, then had difficulty holding the nitroglycerin bottle with her left hand. Chest pain resolved with nitroglycerin and the numbness and weakness seem to completely resolve within approximately 30 minutes. She had never experienced  the neurologic symptoms previously. She also took 2 baby aspirin before coming in. No recent fevers, chills, shortness of breath, or cough. She denied lower extremity swelling or tenderness.   EKG with new non specific T wave abnormality neg troponin's, Her chest pain resolved quickly and no recurrence in hospital.   Last cath 06/04/16  with patent prox and distal LAD stents, patent mid RCA stents.   CT of head and MRI of head normal down for lt sided weakness over all encouraged to hydrate.  Today office visit changed to virtual visit due to ear pain and sore throat.   Her BP is labile to extent but mostly elevated.  She continues with some mild chest discomfort that she has had since stent.   Otherwise she feels well.     The patient does not have symptoms concerning for COVID-19 infection (fever, chills, cough, or new shortness of breath).    Past Medical History:  Diagnosis Date   CAD (coronary artery disease), native coronary artery    DES to LAD in 2 areas, one at the bifurcation of a diagonal branch and also in the mid to distal region. Her diagonal branch was also ballooned . She has a DES to RCA 10/10. Her circumflex artery mild ostial disease.   Hx of cardiovascular stress test    ETT- Myoview (2/16):  No ischemia, EF 69%, normal study   Hyperlipidemia    Hypertension    Sleep apnea    Past Surgical History:  Procedure Laterality Date   biospy breast     BREAST EXCISIONAL BIOPSY Right    BREAST EXCISIONAL BIOPSY  Right    CAROTID STENT     CESAREAN SECTION     FOOT SURGERY     LEFT HEART CATH AND CORONARY ANGIOGRAPHY N/A 06/04/2016   Procedure: Left Heart Cath and Coronary Angiography;  Surgeon: Belva Crome, MD;  Location: [place] [facility];  Service: Cardiovascular;  Laterality: N/A;   LEFT HEART CATHETERIZATION WITH CORONARY ANGIOGRAM N/A 01/16/2011   Procedure: LEFT HEART CATHETERIZATION WITH CORONARY ANGIOGRAM;  Surgeon: Candee Furbish, MD;  Location: [facility]  CATH LAB;  Service: Cardiovascular;  Laterality: N/A;   NOSE SURGERY       Current Meds  Medication Sig   aspirin EC 81 MG tablet Take 81 mg by mouth daily.     carvedilol (COREG) 12.5 MG tablet Take 0.5 tablets (6.25 mg total) by mouth 2 (two) times daily with a meal.   fluocinonide (LIDEX) 0.05 % external solution Apply 1 mL topically at bedtime as needed (for acne flares).    fluticasone (FLONASE) 50 MCG/ACT nasal spray Place 2 sprays into both nostrils 2 (two) times daily as needed for allergies or rhinitis.    lisinopril-hydrochlorothiazide (ZESTORETIC) 20-12.5 MG tablet Take 1 tablet by mouth daily.   metroNIDAZOLE (METROGEL) 0.75 % gel Apply 1 application topically 2 (two) times daily as needed (for rosacea).    montelukast (SINGULAIR) 10 MG tablet Take 10 mg by mouth at bedtime.   nitroGLYCERIN (NITROSTAT) 0.4 MG SL tablet Place 1 tablet (0.4 mg total) under the tongue every 5 (five) minutes as needed for chest pain.   Olopatadine HCl (PATADAY OP) Place 1 drop into both eyes daily as needed (for itching or dryness).   OMEPRAZOLE PO Take 1 tablet by mouth as needed.   rosuvastatin (CRESTOR) 10 MG tablet Take 1 tablet (10 mg total) by mouth at bedtime.   sertraline (ZOLOFT) 100 MG tablet Take 100 mg by mouth daily.   zolpidem (AMBIEN) 10 MG tablet Take 10 mg by mouth at bedtime.    [DISCONTINUED] isosorbide mononitrate (IMDUR) 60 MG 24 hr tablet Take 60 mg by mouth daily.     Allergies:   Sulfa antibiotics and Prednisone   Social History   Tobacco Use   Smoking status: Never Smoker   Smokeless tobacco: Never Used  Substance Use Topics   Alcohol use: Yes    Comment: rarely   Drug use: Not on file     Family Hx: The patient's family history includes Asthma in her brother; COPD in her mother; Cancer in her father; Heart attack in her brother and paternal grandmother; Heart disease in her mother; Heart failure in her paternal grandmother; Hyperlipidemia in her  paternal grandmother; Hypertension in her mother, paternal grandmother, and sister; Ulcers in her mother. There is no history of Stroke.  ROS:   Please see the history of present illness.    General:no colds or fevers, no weight changes Skin:no rashes or ulcers HEENT:no blurred vision, no congestion CV:see HPI PUL:see HPI GI:no diarrhea constipation or melena, no indigestion GU:no hematuria, no dysuria MS:no joint pain, no claudication Neuro:no syncope, no lightheadedness Endo:no diabetes, no thyroid disease  All other systems reviewed and are negative.   Prior CV studies:   The following studies were reviewed today:  Cardiac cath 06/04/16   Widely patent proximal and distal LAD stents. 40% ostial narrowing in the first diagonal.  Patent mid right coronary stents. Proximal 40% RCA eccentric narrowing.  Circumflex is a small vessel with 40-50% first obtuse marginal.  Ramus intermedius (or first  obtuse marginal depending upon categorization) contains 40% proximal narrowing.  Normal LV function. Elevated LVEDP at 23 mmHg. EF 55%.  RECOMMENDATIONS:   When compared to the prior study, no significant changes have occurred since 2012. Continuous chest discomfort is not likely be related to ischemic heart disease.  Labs/Other Tests and Data Reviewed:    EKG:  An ECG dated 07/03/18 was personally reviewed today and demonstrated:  SB at 5059 , T wave inverted in III, and in 04/2017 it was flat   Recent Labs: 07/02/2018: ALT 25 07/03/2018: BUN 13; Creatinine, Ser 0.78; Hemoglobin 12.9; Platelets 318; Potassium 4.0; Sodium 141   Recent Lipid Panel No results found for: CHOL, TRIG, HDL, CHOLHDL, LDLCALC, LDLDIRECT  Wt Readings from Last 3 Encounters:  07/28/18 197 lb (89.4 kg)  07/02/18 194 lb 12.8 oz (88.4 kg)  06/16/18 192 lb (87.1 kg)     Objective:    Vital Signs:  BP (!) 148/79    Pulse 78    Ht 5\' 4"  (1.626 m)    Wt 197 lb (89.4 kg)    BMI 33.81 kg/m   BP at home 159/82 to  129/69 several spikes to 150s.   VITAL SIGNS:  reviewed  General female in NAD Neuro A&O X 3 MAE follows commands  Lungs no SOB can speak in complete sentences without SOb. Psych:  pleasant affect.   ASSESSMENT & PLAN:    1. Chest pain neg MI,  Still with chest pain but back to her normal. Will increase Imdur to 90 mg daily to see if BP with improved control and less pain.   2. HTN uncontrolled at times. See above with adding Imdur. 3. CAD with angina see above.  4. HLd on crestor continue last LDL was 55  COVID-19 Education: The signs and symptoms of COVID-19 were discussed with the patient and how to seek care for testing (follow up with PCP or arrange E-visit).  The importance of social distancing was discussed today.  Time:   Today, I have spent 10 minutes with the patient with telehealth technology discussing the above problems.     Medication Adjustments/Labs and Tests Ordered: Current medicines are reviewed at length with the patient today.  Concerns regarding medicines are outlined above.   Tests Ordered: No orders of the defined types were placed in this encounter.   Medication Changes: Meds ordered this encounter  Medications   isosorbide mononitrate (IMDUR) 60 MG 24 hr tablet    Sig: Take 1.5 tablets (90 mg total) by mouth daily.    Dispense:  90 tablet    Refill:  3    Follow Up:  In Person in 4 week(s)  Signed, Nada BoozerLaura Inza Mikrut, NP  07/28/2018 4:57 PM    [facility]

## 2018-07-28 NOTE — Telephone Encounter (Signed)
   TELEPHONE CALL NOTE  This patient has been deemed a candidate for follow-up tele-health visit to limit community exposure during the Covid-19 pandemic. I spoke with the patient via phone to discuss instructions.  A Virtual Office Visit appointment type has been scheduled for 6/29 with Cecilie Kicks, with "VIDEO" or "TELEPHONE" in the appointment notes - patient prefers Video type.   Frederik Schmidt, RN 07/28/2018 8:47 AM   The patient has consented to a Virtual Visit by Video with Cecilie Kicks on 6/29 @ 3:30 pm.

## 2018-07-28 NOTE — Telephone Encounter (Signed)
  Patient is calling to let us know that she has an appt this afternoon at 3:30pm with Cecilie Kicks but she is experiencing a sore throat and ear ache. She is not running a fever but would like to know if we still want her to come in the office for her visit. She would be okay with a video or telephone visit if we prefer her to do that. Please let her know.

## 2018-08-12 DIAGNOSIS — J358 Other chronic diseases of tonsils and adenoids: Secondary | ICD-10-CM

## 2018-08-12 HISTORY — DX: Other chronic diseases of tonsils and adenoids: J35.8

## 2018-08-27 ENCOUNTER — Telehealth: Payer: Self-pay

## 2018-08-27 NOTE — Telephone Encounter (Signed)

## 2018-08-27 NOTE — Progress Notes (Signed)
Cardiology Office Note   Date:  08/28/2018   ID:  Kristie Owens, DOB 01/09/1956, MRN [id number]  PCP:  Heide ScalesNelson, Kristen M, PA-C  Cardiologist:  Dr. Anne FuSkains    Chief Complaint  Patient presents with  . Chest Pain      History of Present Illness: Kristie Owens is a 63 y.o. female who presents for CAD. Recent admit with chest pain.   She has a medical history significant forcoronary artery disease with stents, hypertension, and hyperlipidemia,presented to ED 6/3after an episode of chest pain and left-sided numbness and weakness. Patient reportedthat she been in her usual state of health and was washing some outdoor furniture when she developed acute onset of chest pain similar to her prior experiences with angina. She also developed some lightheadedness at about the same time and noted numbness and weakness involving her left arm and leg, describing that she drag the left foot when going inside to get her nitroglycerin, then had difficulty holding the nitroglycerin bottle with her left hand. Chest pain resolved with nitroglycerin and the numbness and weakness seem to completely resolve within approximately 30 minutes. She had never experienced the neurologic symptoms previously. She also took 2 baby aspirin before coming in. No recent fevers, chills, shortness of breath, or cough. She deniedlower extremity swelling or tenderness.   EKG with new non specific T wave abnormality neg troponin's, Her chest pain resolved quickly and no recurrence in hospital.   Last cath 06/04/16  with patent prox and distal LAD stents, patent mid RCA stents.   CT of head and MRI of head normal down for lt sided weakness over all encouraged to hydrate.  07/28/18  office visit changed to virtual visit due to ear pain and sore throat.   Her BP is labile to extent but mostly elevated.  She continues with some mild chest discomfort that she has had since stent.   Otherwise she feels well.   her imdur increased to 90 mg daily.  BP was still elevated at times.   today still with chest aching.  occ premature beats.  BP is also elevated to 161/83 at times.  No SOB.  No edema.     Past Medical History:  Diagnosis Date  . CAD (coronary artery disease), native coronary artery    DES to LAD in 2 areas, one at the bifurcation of a diagonal branch and also in the mid to distal region. Her diagonal branch was also ballooned . She has a DES to RCA 10/10. Her circumflex artery mild ostial disease.  [name]. Hx of cardiovascular stress test    ETT- Myoview (2/16):  No ischemia, EF 69%, normal study  . Hyperlipidemia   . Hypertension   . Sleep apnea     Past Surgical History:  Procedure Laterality Date  . biospy breast    . BREAST EXCISIONAL BIOPSY Right   . BREAST EXCISIONAL BIOPSY Right   . CAROTID STENT    . CESAREAN SECTION    . FOOT SURGERY    . LEFT HEART CATH AND CORONARY ANGIOGRAPHY N/A 06/04/2016   Procedure: Left Heart Cath and Coronary Angiography;  Surgeon: Lyn RecordsSmith, Henry W, MD;  [facility];  Service: Cardiovascular;  Laterality: N/A;  . LEFT HEART CATHETERIZATION WITH CORONARY ANGIOGRAM N/A 01/16/2011   Procedure: LEFT HEART CATHETERIZATION WITH CORONARY ANGIOGRAM;  Surgeon: Donato SchultzMark Skains, MD;  [facility];  Service: Cardiovascular;  Laterality: N/A;  . NOSE SURGERY  Current Outpatient Medications  Medication Sig Dispense Refill  . aspirin EC 81 MG tablet Take 81 mg by mouth daily.      . carvedilol (COREG) 12.5 MG tablet Take 0.5 tablets (6.25 mg total) by mouth 2 (two) times daily with a meal.    . fluocinonide (LIDEX) 0.05 % external solution Apply 1 mL topically at bedtime as needed (for acne flares).     . fluticasone (FLONASE) 50 MCG/ACT nasal spray Place 2 sprays into both nostrils 2 (two) times daily as needed for allergies or rhinitis.   11  . isosorbide mononitrate (IMDUR) 60 MG 24 hr tablet Take 1.5 tablets (90 mg total) by mouth  daily. 90 tablet 3  . lisinopril-hydrochlorothiazide (ZESTORETIC) 20-12.5 MG tablet Take 1 tablet by mouth daily.    . metroNIDAZOLE (METROGEL) 0.75 % gel Apply 1 application topically 2 (two) times daily as needed (for rosacea).     . montelukast (SINGULAIR) 10 MG tablet Take 10 mg by mouth at bedtime.    . nitroGLYCERIN (NITROSTAT) 0.4 MG SL tablet Place 1 tablet (0.4 mg total) under the tongue every 5 (five) minutes as needed for chest pain. 25 tablet 3  . Olopatadine HCl (PATADAY OP) Place 1 drop into both eyes daily as needed (for itching or dryness).    . OMEPRAZOLE PO Take 1 tablet by mouth as needed.    . rosuvastatin (CRESTOR) 10 MG tablet Take 1 tablet (10 mg total) by mouth at bedtime. 90 tablet 3  . sertraline (ZOLOFT) 100 MG tablet Take 100 mg by mouth daily.    [name]. zolpidem (AMBIEN) 10 MG tablet Take 10 mg by mouth at bedtime.   2  . amLODipine (NORVASC) 5 MG tablet Take 1 tablet (5 mg total) by mouth daily. 90 tablet 3   No current facility-administered medications for this visit.     Allergies:   Sulfa antibiotics and Prednisone    Social History:  The patient  reports that she has never smoked. She has never used smokeless tobacco. She reports current alcohol use.   Family History:  The patient's family history includes Asthma in her brother; COPD in her mother; Cancer in her father; Heart attack in her brother and paternal grandmother; Heart disease in her mother; Heart failure in her paternal grandmother; Hyperlipidemia in her paternal grandmother; Hypertension in her mother, paternal grandmother, and sister; Ulcers in her mother.    ROS:  General:no colds or fevers, some weight decrease Skin:no rashes or ulcers HEENT:no blurred vision, no congestion CV:see HPI PUL:see HPI GI:no diarrhea constipation or melena, no indigestion GU:no hematuria, no dysuria MS:no joint pain, no claudication Neuro:no syncope, no lightheadedness Endo:no diabetes, no thyroid disease  Wt  Readings from Last 3 Encounters:  08/28/18 187 lb (84.8 kg)  07/28/18 197 lb (89.4 kg)  07/02/18 194 lb 12.8 oz (88.4 kg)     PHYSICAL EXAM: VS:  BP 130/74   Pulse 76   Ht 5\' 4"  (1.626 m)   Wt 187 lb (84.8 kg)   BMI 32.10 kg/m  , BMI Body mass index is 32.1 kg/m. General:Pleasant affect, NAD Skin:Warm and dry, brisk capillary refill HEENT:normocephalic, sclera clear, mucus membranes moist Neck:supple, no JVD, no bruits  Heart:S1S2 RRR without murmur, gallup, rub or click Lungs:clear without rales, rhonchi, or wheezes ZOX:WRUEAbd:soft, non tender, + BS, do not palpate liver spleen or masses Ext:no lower ext edema, 2+ pedal pulses, 2+ radial pulses Neuro:alert and oriented X 3, MAE, follows commands, + facial symmetry  EKG:  EKG is ordered today. The ekg ordered today demonstrates SR at 76 no ST changes.   Recent Labs: 07/02/2018: ALT 25 07/03/2018: BUN 13; Creatinine, Ser 0.78; Hemoglobin 12.9; Platelets 318; Potassium 4.0; Sodium 141    Lipid Panel No results found for: CHOL, TRIG, HDL, CHOLHDL, VLDL, LDLCALC, LDLDIRECT     Other studies Reviewed: Additional studies/ records that were reviewed today include: . Cardiac cath 06/04/16   Widely patent proximal and distal LAD stents. 40% ostial narrowing in the first diagonal.  Patent mid right coronary stents. Proximal 40% RCA eccentric narrowing.  Circumflex is a small vessel with 40-50% first obtuse marginal.  Ramus intermedius (or first obtuse marginal depending upon categorization) contains 40% proximal narrowing.  Normal LV function. Elevated LVEDP at 23 mmHg. EF 55%.  RECOMMENDATIONS:   When compared to the prior study, no significant changes have occurred since 2012. Continuous chest discomfort is not likely be related to ischemic heart disease.   ASSESSMENT AND PLAN:  1.  Chest pain ongoing.  Will do lexiscan myoview to eval with her hx of stents and CAD.  Unable to do exercise due to COVID. Last visit I  increased her imdur to 90 mg. Follow up in 6 weeks.    2.  CAD with prior stents see above  3.  HTN will add amlodipine this may help with chest pain as well.  She is low energy and increasing BB would only cause more of this.  She will continue her lisinopril hctz as well.   4.  HLD on crestor continue. LDL controlled.     Current medicines are reviewed with the patient today.  The patient Has no concerns regarding medicines.  The following changes have been made:  See above Labs/ tests ordered today include:see above  Disposition:   FU:  see above  Signed, Cecilie Kicks, NP  08/28/2018 2:31 PM    [facility] [address] [place], [place] [place], [place] Phone: [phone]; Fax: [phone]

## 2018-08-28 ENCOUNTER — Ambulatory Visit (INDEPENDENT_AMBULATORY_CARE_PROVIDER_SITE_OTHER): Payer: Managed Care, Other (non HMO) | Admitting: Cardiology

## 2018-08-28 ENCOUNTER — Other Ambulatory Visit: Payer: Self-pay

## 2018-08-28 ENCOUNTER — Encounter: Payer: Self-pay | Admitting: *Deleted

## 2018-08-28 ENCOUNTER — Encounter: Payer: Self-pay | Admitting: Cardiology

## 2018-08-28 VITALS — BP 130/74 | HR 76 | Ht 64.0 in | Wt 187.0 lb

## 2018-08-28 DIAGNOSIS — I2510 Atherosclerotic heart disease of native coronary artery without angina pectoris: Secondary | ICD-10-CM

## 2018-08-28 DIAGNOSIS — R0789 Other chest pain: Secondary | ICD-10-CM | POA: Diagnosis not present

## 2018-08-28 DIAGNOSIS — I208 Other forms of angina pectoris: Secondary | ICD-10-CM

## 2018-08-28 DIAGNOSIS — E782 Mixed hyperlipidemia: Secondary | ICD-10-CM

## 2018-08-28 DIAGNOSIS — I2089 Other forms of angina pectoris: Secondary | ICD-10-CM

## 2018-08-28 DIAGNOSIS — R079 Chest pain, unspecified: Secondary | ICD-10-CM

## 2018-08-28 DIAGNOSIS — I10 Essential (primary) hypertension: Secondary | ICD-10-CM | POA: Diagnosis not present

## 2018-08-28 MED ORDER — AMLODIPINE BESYLATE 5 MG PO TABS: 5.0000 mg | ORAL_TABLET | Freq: Every day | ORAL | 3 refills | Status: DC

## 2018-08-28 NOTE — Patient Instructions (Addendum)
Medication Instructions:   STAR TAKING AMLODIPINE 5 MG ONCE A DAY    If you need a refill on your cardiac medications before your next appointment, please call your pharmacy.   Lab work: NONE ORDERED  TODAY   If you have labs (blood work) drawn today and your tests are completely normal, you will receive your results only by: [name] MyChart Message (if you have MyChart) OR . A paper copy in the mail If you have any lab test that is abnormal or we need to change your treatment, we will call you to review the results.  Testing/Procedures: Your physician has requested that you have a lexiscan myoview. For further information please visit [email or web address]. Please follow instruction sheet, as given.   Follow-Up: IN 6 WEEKS WITH  SKAINS  Any Other Special Instructions Will Be Listed Below (If Applicable).

## 2018-09-09 ENCOUNTER — Telehealth (HOSPITAL_COMMUNITY): Payer: Self-pay | Admitting: *Deleted

## 2018-09-09 NOTE — Telephone Encounter (Signed)
Patient given detailed instructions per Myocardial Perfusion Study Information Sheet for the test on 09/12/18 at 7:45. Patient notified to arrive 15 minutes early and that it is imperative to arrive on time for appointment to keep from having the test rescheduled.  If you need to cancel or reschedule your appointment, please call the office within 24 hours of your appointment. . Patient verbalized understanding.Kristie Owens

## 2018-09-12 ENCOUNTER — Other Ambulatory Visit: Payer: Self-pay

## 2018-09-12 ENCOUNTER — Ambulatory Visit (HOSPITAL_COMMUNITY): Payer: Managed Care, Other (non HMO) | Attending: Cardiovascular Disease

## 2018-09-12 DIAGNOSIS — I208 Other forms of angina pectoris: Secondary | ICD-10-CM | POA: Diagnosis present

## 2018-09-12 LAB — MYOCARDIAL PERFUSION IMAGING
LV dias vol: 70 mL (ref 46–106)
LV sys vol: 21 mL
Peak HR: 80 {beats}/min
Rest HR: 60 {beats}/min
SDS: 0
SRS: 0
SSS: 0
TID: 1.03

## 2018-09-12 MED ORDER — AMINOPHYLLINE 25 MG/ML IV SOLN
75.0000 mg | Freq: Once | INTRAVENOUS | Status: AC
  Administered 2018-09-12: 75 mg via INTRAVENOUS

## 2018-09-12 MED ORDER — TECHNETIUM TC 99M TETROFOSMIN IV KIT
31.7000 | PACK | Freq: Once | INTRAVENOUS | Status: AC | PRN
  Administered 2018-09-12: 31.7 via INTRAVENOUS
  Filled 2018-09-12: qty 32

## 2018-09-12 MED ORDER — REGADENOSON 0.4 MG/5ML IV SOLN
0.4000 mg | Freq: Once | INTRAVENOUS | Status: AC
  Administered 2018-09-12: 0.4 mg via INTRAVENOUS

## 2018-09-12 MED ORDER — TECHNETIUM TC 99M TETROFOSMIN IV KIT
10.5000 | PACK | Freq: Once | INTRAVENOUS | Status: AC | PRN
  Administered 2018-09-12: 10.5 via INTRAVENOUS
  Filled 2018-09-12: qty 11

## 2018-09-12 NOTE — Progress Notes (Signed)
Pt has been made aware of normal result and verbalized understanding.  jw 09/12/2018

## 2018-10-03 LAB — LIPID PANEL
Cholesterol: 131 (ref 0–200)
HDL: 33 — AB (ref 35–70)
LDL Cholesterol: 97
Triglycerides: 184 — AB (ref 40–160)

## 2018-10-03 LAB — HEMOGLOBIN A1C: Hemoglobin A1C: 6.8

## 2018-10-03 LAB — HEPATIC FUNCTION PANEL
ALT: 24 (ref 7–35)
AST: 27 (ref 13–35)

## 2018-10-03 LAB — BASIC METABOLIC PANEL: Creatinine: 0.7 (ref 0.5–1.1)

## 2018-10-06 LAB — MICROALBUMIN, URINE: Microalb, Ur: 2.17

## 2018-10-13 ENCOUNTER — Other Ambulatory Visit: Payer: Self-pay

## 2018-10-13 ENCOUNTER — Ambulatory Visit (INDEPENDENT_AMBULATORY_CARE_PROVIDER_SITE_OTHER): Payer: Managed Care, Other (non HMO) | Admitting: Cardiology

## 2018-10-13 ENCOUNTER — Encounter: Payer: Self-pay | Admitting: Cardiology

## 2018-10-13 VITALS — BP 114/62 | HR 73 | Ht 64.0 in | Wt 201.0 lb

## 2018-10-13 DIAGNOSIS — R0789 Other chest pain: Secondary | ICD-10-CM | POA: Diagnosis not present

## 2018-10-13 DIAGNOSIS — I2510 Atherosclerotic heart disease of native coronary artery without angina pectoris: Secondary | ICD-10-CM

## 2018-10-13 DIAGNOSIS — I208 Other forms of angina pectoris: Secondary | ICD-10-CM

## 2018-10-13 DIAGNOSIS — R079 Chest pain, unspecified: Secondary | ICD-10-CM

## 2018-10-13 DIAGNOSIS — I10 Essential (primary) hypertension: Secondary | ICD-10-CM

## 2018-10-13 DIAGNOSIS — E782 Mixed hyperlipidemia: Secondary | ICD-10-CM

## 2018-10-13 NOTE — Progress Notes (Signed)
[address], [address] [place], [place]  [address] Phone: [phone] Fax:  [phone]  Date:  10/13/2018   ID:  Kristie Owens, DOB 21-Mar-1955, MRN [id number]  PCP:  Heide Scales, PA-C   History of Present Illness: Kristie Owens is a 63 y.o. female with coronary artery disease status post heart catheterization in [place] of 2012 and May 2018 demonstrating widely patent LAD and RCA stent, mild irregularity of the ostium of the diagonal were PTCA was felt to be high risk, here for followup.   She called [place] on 07/22/15 complaining of chest pain/tightness, fatigue, shortness of breath with activity. She been doing a lot of yard work over the past 3 weeks and doesn't know if her symptoms were related to her heart no amount of rest seems to resolve symptoms. Pressure goes up into her throat about 95% of the time. No other radiation. She is worried about this. Her hands and ankles seem to be swelling as well over the past 2-3 weeks especially after hot yard work. She's been short of breath off and on as well.  Previous visit she had described nonexertional, fleeting chest discomfort different from her prior symptoms. We went over her last cardiac catheterization in [place] of 2012. LAD stent was patent. Diagonal ostial stenosis would be high risk.  Her stress test in 2016 was reassuring. No signs of ischemia. Normal function. However, a repeat stress test in 2017 did show potentially a mild area of anterior septal/apical ischemia. Still continue to be fatigue,  sleepiness, lack of energy. Decreasing carvedilol 6.25 mg twice a day.   I increased her isosorbide at previous visit. Maximal dosing.   Married in 11/14. Palpitations, lack of endurance at times.   Kristie [place] is sister, colon cancer. Works at [employer]. One of her coworkers had a small MI.  05/30/16-she continues to have increasing anginal symptoms, decreasing endurance, not feeling well. She is concerned about  progression of coronary artery disease. Nuclear stress test performed in 2017 demonstrated anteroseptal/apical possible ischemia. We were treating this to potential breast artifact and continued with aggressive medical therapy area she still continues to have symptoms despite this. She had the flu in January and did not recover as quickly as she wanted. Positive for chest pain, mild shortness of breath. Decreased energy.  04/29/17-reassuring cardiac catheterization.  No changes in May 2018.  She still continues to have the same symptoms.  We are trying decreased isosorbide to 60. Positive chest discomfort and windedness at times.  Sometimes has trouble getting up stairs.  No syncope, no bleeding.  10/13/2018-had nuclear stress test on 09/12/2018 which was low risk no ischemia.  This was after office visit describing chest pain in late July with [name]  Wt Readings from Last 3 Encounters:  09/12/18 187 lb (84.8 kg)  08/28/18 187 lb (84.8 kg)  07/28/18 197 lb (89.4 kg)     Past Medical History:  Diagnosis Date  . CAD (coronary artery disease), native coronary artery    DES to LAD in 2 areas, one at the bifurcation of a diagonal branch and also in the mid to distal region. Her diagonal branch was also ballooned . She has a DES to RCA 10/10. Her circumflex artery mild ostial disease.  [name] Hx of cardiovascular stress test    ETT- Myoview (2/16):  No ischemia, EF 69%, normal study  . Hyperlipidemia   . Hypertension   . Sleep apnea  Past Surgical History:  Procedure Laterality Date  . biospy breast    . BREAST EXCISIONAL BIOPSY Right   . BREAST EXCISIONAL BIOPSY Right   . CAROTID STENT    . CESAREAN SECTION    . FOOT SURGERY    . LEFT HEART CATH AND CORONARY ANGIOGRAPHY N/A 06/04/2016   Procedure: Left Heart Cath and Coronary Angiography;  Surgeon: Belva Crome, MD;  Location: [facility];  Service: Cardiovascular;  Laterality: N/A;  . LEFT HEART CATHETERIZATION WITH CORONARY  ANGIOGRAM N/A 01/16/2011   Procedure: LEFT HEART CATHETERIZATION WITH CORONARY ANGIOGRAM;  Surgeon: Candee Furbish, MD;  [facility];  Service: Cardiovascular;  Laterality: N/A;  . NOSE SURGERY      Current Outpatient Medications  Medication Sig Dispense Refill  . amLODipine (NORVASC) 5 MG tablet Take 1 tablet (5 mg total) by mouth daily. 90 tablet 3  . aspirin EC 81 MG tablet Take 81 mg by mouth daily.      . carvedilol (COREG) 12.5 MG tablet Take 0.5 tablets (6.25 mg total) by mouth 2 (two) times daily with a meal.    . fluocinonide (LIDEX) 0.05 % external solution Apply 1 mL topically at bedtime as needed (for acne flares).     . fluticasone (FLONASE) 50 MCG/ACT nasal spray Place 2 sprays into both nostrils 2 (two) times daily as needed for allergies or rhinitis.   11  . isosorbide mononitrate (IMDUR) 60 MG 24 hr tablet Take 1.5 tablets (90 mg total) by mouth daily. 90 tablet 3  . lisinopril-hydrochlorothiazide (ZESTORETIC) 20-12.5 MG tablet Take 1 tablet by mouth daily.    . metroNIDAZOLE (METROGEL) 0.75 % gel Apply 1 application topically 2 (two) times daily as needed (for rosacea).     . montelukast (SINGULAIR) 10 MG tablet Take 10 mg by mouth at bedtime.    . nitroGLYCERIN (NITROSTAT) 0.4 MG SL tablet Place 1 tablet (0.4 mg total) under the tongue every 5 (five) minutes as needed for chest pain. 25 tablet 3  . Olopatadine HCl (PATADAY OP) Place 1 drop into both eyes daily as needed (for itching or dryness).    . OMEPRAZOLE PO Take 1 tablet by mouth as needed.    . rosuvastatin (CRESTOR) 10 MG tablet Take 1 tablet (10 mg total) by mouth at bedtime. 90 tablet 3  . sertraline (ZOLOFT) 100 MG tablet Take 100 mg by mouth daily.    [name] zolpidem (AMBIEN) 10 MG tablet Take 10 mg by mouth at bedtime.   2   No current facility-administered medications for this visit.     Allergies:    Allergies  Allergen Reactions  . Sulfa Antibiotics Other (See Comments)    From childhood- reaction  not recalled  . Prednisone Anxiety and Other (See Comments)    "Made me not feel like myself, altered"    Social History:  The patient  reports that she has never smoked. She has never used smokeless tobacco. She reports current alcohol use.   ROS:  Please see the history of present illness. All other ROS negative.   PHYSICAL EXAM: VS:  There were no vitals taken for this visit. GEN: Well nourished, well developed, in no acute distress  HEENT: normal  Neck: no JVD, carotid bruits, or masses Cardiac: RRR; no murmurs, rubs, or gallops,no edema  Respiratory:  clear to auscultation bilaterally, normal work of breathing GI: soft, nontender, nondistended, + BS MS: no deformity or atrophy  Skin: warm and dry, no  rash Neuro:  Alert and Oriented x 3, Strength and sensation are intact Psych: euthymic mood, full affect     EKG: 08/28/2018- sinus rhythm NSSTW changes no other abnormalities 05/30/16-sinus rhythm 69 with no other abnormalities personally viewed-prior 07/25/15- sinus rhythm, 75, no other abnormalities. 05/13/13-sinus rhythm rate 85 with no other abnormalities     Nuclear stress 09/12/2018:  Nuclear stress EF: 70%.  There was no ST segment deviation noted during stress.  The study is normal.  The left ventricular ejection fraction is hyperdynamic (>65%).   Normal resting and stress perfusion. No ischemia or infarction EF 70% NUC 07/26/15  Nuclear stress EF: 72%.  No T wave inversion was noted during stress.  There was no ST segment deviation noted during stress.  Defect 1: There is a medium defect of moderate severity.  Findings consistent with ischemia.  This is an intermediate risk study.   Small to moderate sized and intensity reversible anterior/anteroapical wall perfusion defect, suggestive of ischemia or possible shifting breast artifact. Target HR was not achieved with exercise, suggesting chronotropic incompetence - lexiscan was administered. LVEF 72% with  normal wall motion. This is an intermediate risk study. Clinical correlation is advised.  Cardiac catheterization on 06/04/16:  Widely patent proximal and distal LAD stents. 40% ostial narrowing in the first diagonal.  Patent mid right coronary stents. Proximal 40% RCA eccentric narrowing.  Circumflex is a small vessel with 40-50% first obtuse marginal.  Ramus intermedius (or first obtuse marginal depending upon categorization) contains 40% proximal narrowing.  Normal LV function. Elevated LVEDP at 23 mmHg. EF 55%.  RECOMMENDATIONS:   When compared to the prior study, no significant changes have occurred since 2012. Continuous chest discomfort is not likely be related to ischemic heart disease.  ASSESSMENT AND PLAN:   Coronary artery disease/angina  - Prior stent to LAD widely patent on cardiac catheterization 01/16/11 and 06/04/16, overall reassuring.  Also had nuclear stress test in August 2020 that was low risk with no ischemia.  Excellent.  - Continuing with medical therapy, antianginal therapy.   isosorbide 90. I want her to continue to work on conditioning however walking.  Her back is been bothering her and she is going to the chiropractor.  No changes made today    Essential hypertension  -Excellent control.  Isosorbide previously reduced to 90.  Seems to be doing quite well.  Multidrug regimen reviewed.  Blood pressure 114/62.  Hyperlipidemia  - Crestor. Doing well.  LDL currently 60 on 10/03/2018.  Excellent.  We will see Nada BoozerLaura Ingold in 12 months and me in 2 years  Signed, Donato SchultzMark Montrez [place], MD [facility]  10/13/2018 11:39 AM

## 2018-10-13 NOTE — Patient Instructions (Addendum)
Your physician recommends that you continue on your current medications as directed. Please refer to the Current Medication list given to you today.  Your physician wants you to follow-up in: [place] NP AND 2 YEARS WITH DR Marlou Porch You will receive a reminder letter in the mail two months in advance. If you don't receive a letter, please call our office to schedule the follow-up appointment.

## 2018-11-19 ENCOUNTER — Other Ambulatory Visit: Payer: Self-pay | Admitting: Cardiology

## 2019-01-16 ENCOUNTER — Other Ambulatory Visit: Payer: Self-pay | Admitting: Cardiology

## 2019-04-24 LAB — HEMOGLOBIN A1C: Hemoglobin A1C: 6.6

## 2019-04-24 LAB — LIPID PANEL
Cholesterol: 135 (ref 0–200)
HDL: 33 — AB (ref 35–70)
LDL Cholesterol: 73
Triglycerides: 169 — AB (ref 40–160)

## 2019-04-24 LAB — BASIC METABOLIC PANEL
BUN: 18 (ref 4–21)
Creatinine: 0.7 (ref 0.5–1.1)

## 2019-04-24 LAB — HEPATIC FUNCTION PANEL
ALT: 20 (ref 7–35)
AST: 22 (ref 13–35)
Bilirubin, Total: 0.4

## 2019-05-29 ENCOUNTER — Encounter: Payer: Self-pay | Admitting: Family Medicine

## 2019-06-08 ENCOUNTER — Encounter: Payer: Self-pay | Admitting: Family Medicine

## 2019-06-09 ENCOUNTER — Encounter: Payer: Self-pay | Admitting: Family Medicine

## 2019-06-09 ENCOUNTER — Ambulatory Visit (INDEPENDENT_AMBULATORY_CARE_PROVIDER_SITE_OTHER): Payer: Managed Care, Other (non HMO) | Admitting: Family Medicine

## 2019-06-09 ENCOUNTER — Other Ambulatory Visit: Payer: Self-pay

## 2019-06-09 VITALS — BP 119/68 | HR 71 | Temp 97.6°F | Resp 18 | Ht 64.0 in | Wt 201.2 lb

## 2019-06-09 DIAGNOSIS — E119 Type 2 diabetes mellitus without complications: Secondary | ICD-10-CM

## 2019-06-09 DIAGNOSIS — M25562 Pain in left knee: Secondary | ICD-10-CM

## 2019-06-09 DIAGNOSIS — F329 Major depressive disorder, single episode, unspecified: Secondary | ICD-10-CM

## 2019-06-09 DIAGNOSIS — M25561 Pain in right knee: Secondary | ICD-10-CM

## 2019-06-09 DIAGNOSIS — L608 Other nail disorders: Secondary | ICD-10-CM | POA: Diagnosis not present

## 2019-06-09 LAB — C-REACTIVE PROTEIN: CRP: <1 mg/dL (ref 0.5–20.0)

## 2019-06-09 LAB — SEDIMENTATION RATE: Sed Rate: 39 mm/hr — ABNORMAL HIGH (ref 0–30)

## 2019-06-09 MED ORDER — MUPIROCIN CALCIUM 2 % EX CREA: 1.0000 "application " | TOPICAL_CREAM | Freq: Two times a day (BID) | CUTANEOUS | 0 refills | Status: DC

## 2019-06-09 MED ORDER — MELOXICAM 15 MG PO TABS: 15.0000 mg | ORAL_TABLET | Freq: Every day | ORAL | 1 refills | Status: DC

## 2019-06-09 NOTE — Progress Notes (Signed)
Patient ID: Kristie [place], female  DOB: [birth year], 64 y.o.   MRN: [id number] Patient Care Team    Relationship Specialty Notifications Start End  Natalia Leatherwood, DO PCP - General Family Medicine  06/09/19   Jake Bathe, MD Consulting Physician Cardiology  06/08/19     Chief Complaint  Patient presents with  . Establish Care    Prior PCP Dr Delton See @ [facility]. Bilateral knee pain that just started a few months ago.     Subjective:  Kristie Owens is a 64 y.o.  female present for new patient establishment. All past medical history, surgical history, allergies, family history, immunizations, medications and social history were updated in the electronic medical record today. All recent labs, ED visits and hospitalizations within the last year were reviewed. Patient has a significant chronic medical history of diabetes type 2, hypertension, hyperlipidemia, CAD, depression, allergies and insomnia.  Bilateral Knee pain: Patient reports she is having bilateral knee pain for the last couple months.  She denies any injury or overactivity prior to onset of symptoms.  She denies any swelling or redness.  She states she hurts when she stands up from a seated position and with walking.  The pain is consistent throughout the day.  She is also had some mild low back pain over the last year.  She had been taking Mobic up until 2-3 months ago for her back discomfort.  She also has bilateral elbow and wrist discomfort at times, without swelling or erythema noted.  Her sister has arthritis, she is not certain what type.  Nailbed deformity: Patient endorses having a left index finger nail discoloration present just over the last few days.  She states it is not painful.  No redness or swelling.  She vaguely remembers possibly injuring the nail a few months ago.  She is concerned for fungal infection.  Depression screen [facility] 2/9 06/09/2019 10/30/2017  Decreased Interest 0 0  Down, Depressed,  Hopeless 0 0  PHQ - 2 Score 0 0  Altered sleeping 2 -  Tired, decreased energy 0 -  Change in appetite 3 -  Feeling bad or failure about yourself  0 -  Trouble concentrating 0 -  Moving slowly or fidgety/restless 0 -  Suicidal thoughts 0 -  PHQ-9 Score 5 -  Difficult doing work/chores Not difficult at all -   No flowsheet data found.     Fall Risk  10/30/2017  Falls in the past year? No   Immunization History  Administered Date(s) Administered  . Influenza,inj,Quad PF,6+ Mos 12/15/2013  . Moderna SARS-COVID-2 Vaccination 02/19/2019, 03/27/2019  . Pneumococcal Polysaccharide-23 11/25/2018    No exam data present  Past Medical History:  Diagnosis Date  . Allergy   . Angina at rest [facility])    Dr Anne Fu  . CAD (coronary artery disease), native coronary artery    DES to LAD in 2 areas, one at the bifurcation of a diagonal branch and also in the mid to distal region. Her diagonal branch was also ballooned . She has a DES to RCA 10/10. Her circumflex artery mild ostial disease.  . Chicken pox   . Depression   . DM (diabetes mellitus) (HCC)   . Hx of cardiovascular stress test    ETT- Myoview (2/16):  No ischemia, EF 69%, normal study  . Hyperlipidemia   . Hypertension   . Lumbar pain   . OSA on CPAP    Mild, mild hypoxia 83% AHI -  11.1, O2 minimum 83%   Allergies  Allergen Reactions  . Sulfa Antibiotics Other (See Comments)    From childhood- reaction not recalled  . Wellbutrin [Bupropion] Nausea Only    Nausea and headaches   . Prednisone Anxiety and Other (See Comments)    "Made me not feel like myself, altered"   Past Surgical History:  Procedure Laterality Date  . BREAST EXCISIONAL BIOPSY Right   . CAROTID STENT    . CESAREAN SECTION    . FOOT SURGERY    . LEFT HEART CATH AND CORONARY ANGIOGRAPHY N/A 06/04/2016   Procedure: Left Heart Cath and Coronary Angiography;  Surgeon: Belva Crome, MD;  Location: [facility];  Service: Cardiovascular;  Laterality:  N/A;  . LEFT HEART CATHETERIZATION WITH CORONARY ANGIOGRAM N/A 01/16/2011   Procedure: LEFT HEART CATHETERIZATION WITH CORONARY ANGIOGRAM;  Surgeon: Candee Furbish, MD;  Location: [facility] C[facility] LAB;  Service: Cardiovascular;  Laterality: N/A;  . NOSE SURGERY     Family History  Problem Relation Age of Onset  . Hyperlipidemia Brother   . Hypertension Brother   . Intellectual disability Brother   . Kidney disease Brother   . Hypertension Mother   . Heart disease Mother   . COPD Mother   . Ulcers Mother   . Lung cancer Father   . Heart failure Paternal Grandmother   . Heart attack Paternal Grandmother   . Hypertension Paternal Grandmother   . Hyperlipidemia Paternal Grandmother   . Heart disease Paternal Grandmother   . Heart attack Brother   . Asthma Brother   . Early death Brother   . Hyperlipidemia Brother   . Hypertension Brother   . Intellectual disability Brother   . Kidney disease Brother   . Heart disease Brother   . Hypertension Sister   . Diabetes Sister   . Early death Maternal Grandmother   . Tuberculosis Maternal Grandmother   . Lung cancer Maternal Grandfather   . Suicidality Maternal Grandfather   . Early death Maternal Grandfather   . Heart disease Paternal Grandfather   . Hyperlipidemia Paternal Grandfather   . Hypertension Paternal Grandfather   . Heart attack Other   . Arthritis Sister   . Depression Sister   . Miscarriages / Stillbirths Sister   . Alcohol abuse Brother   . Stroke Neg Hx    Social History   Social History Narrative  . Not on file    Allergies as of 06/09/2019      Reactions   Sulfa Antibiotics Other (See Comments)   From childhood- reaction not recalled   Wellbutrin [bupropion] Nausea Only   Nausea and headaches    Prednisone Anxiety, Other (See Comments)   "Made me not feel like myself, altered"      Medication List       Accurate as of Jun 09, 2019  9:24 AM. If you have any questions, ask your nurse or doctor.          amLODipine 5 MG tablet Commonly known as: NORVASC Take 1 tablet (5 mg total) by mouth daily.   aspirin EC 81 MG tablet Take 81 mg by mouth daily.   carvedilol 6.25 MG tablet Commonly known as: COREG Take 1 tablet (6.25 mg total) by mouth 2 (two) times daily with a meal.   fluocinonide 0.05 % external solution Commonly known as: LIDEX Apply 1 mL topically at bedtime as needed (for acne flares).   fluticasone 50 MCG/ACT nasal spray Commonly known as:  FLONASE Place 2 sprays into both nostrils 2 (two) times daily as needed for allergies or rhinitis.   isosorbide mononitrate 60 MG 24 hr tablet Commonly known as: IMDUR TAKE 1 TABLET BY MOUTH EVERY DAY   lisinopril-hydrochlorothiazide 20-12.5 MG tablet Commonly known as: ZESTORETIC Take 1 tablet by mouth daily.   meloxicam 15 MG tablet Commonly known as: MOBIC Take 1 tablet (15 mg total) by mouth daily. Started by: Felix Pacini, DO   metroNIDAZOLE 0.75 % gel Commonly known as: METROGEL Apply 1 application topically 2 (two) times daily as needed (for rosacea).   montelukast 10 MG tablet Commonly known as: SINGULAIR Take 10 mg by mouth at bedtime.   mupirocin cream 2 % Commonly known as: Bactroban Apply 1 application topically 2 (two) times daily. Started by: Felix Pacini, DO   nitroGLYCERIN 0.4 MG SL tablet Commonly known as: NITROSTAT Place 1 tablet (0.4 mg total) under the tongue every 5 (five) minutes as needed for chest pain.   OMEPRAZOLE PO Take 1 tablet by mouth as needed.   PATADAY OP Place 1 drop into both eyes daily as needed (for itching or dryness).   rosuvastatin 10 MG tablet Commonly known as: Crestor Take 1 tablet (10 mg total) by mouth at bedtime.   sertraline 100 MG tablet Commonly known as: ZOLOFT Take 100 mg by mouth daily.   zolpidem 10 MG tablet Commonly known as: AMBIEN Take 10 mg by mouth at bedtime.       All past medical history, surgical history, allergies, family history,  immunizations andmedications were updated in the EMR today and reviewed under the history and medication portions of their EMR.      ROS: 14 pt review of systems performed and negative (unless mentioned in an HPI)  Objective: BP 119/68 (BP Location: Left Arm, Patient Position: Sitting, Cuff Size: Normal)   Pulse 71   Temp 97.6 F (36.4 C) (Temporal)   Resp 18   Ht 5\' 4"  (1.626 m)   Wt 201 lb 4 oz (91.3 kg)   SpO2 96%   BMI 34.54 kg/m  Gen: Afebrile. No acute distress. Nontoxic in appearance, well-developed, well-nourished,  Pleasant female.  HENT: AT. [place].  Eyes:Pupils Equal Round Reactive to light, Extraocular movements intact,  Conjunctiva without redness, discharge or icterus. Skin: no rashes, purpura or petechiae. Warm and well-perfused. Skin intact. Neuro/Msk: Normal gait. PERLA. EOMi. Alert. Oriented x3.     - (knee): Bilateral knees without erythema, no soft tissue swelling or effusion present. No ttp. Bony prominence at right >Left-bilateral tibial tuberosity. No ligament laxity. No joint line tenderness. FROM with crepitus on ext right >Left. NV intact distally.  Muscle strength 5/5 upper/lower extremity. DTRs equal bilaterally. Left index finger: Small area of opaqueness on nail bed near cuticle.  No erythema, no yellowing or other discoloration, no bruising no swelling of finger. Psych: Normal affect, dress and demeanor. Normal speech. Normal thought content and judgment.   Assessment/plan: Felicha Frayne is a 64 y.o. female present for est care - acute concern Acute pain of both knees - discussed types of arthritis with her today. There is a fhx of arthritis  in her sister of unknown type. Pt has symptoms in multiple joints> she elected to proceed with arthritis work up. Will wait on xrays for now- no injury and alarm signs on exam. She is aware we may need to perform in the future.  - restart Mobic qd with food.  - try OTC voltaren gel/cream.  -  discussed cherry tart  extract and tumeric natural supplements and their SE potentials  - ANA, IFA Comprehensive Panel-([facility]) - Rheumatoid Factor - Cyclic citrul peptide antibody, IgG ([facility]) - Sedimentation rate - C-reactive protein - if negative work up follow up with chronic conditions- sooner if worsening. If + work up pt will be guided on next steps.   Acquired deformity of nail of finger Has a very small.  Possible injury a few weeks ago no with growth.  Continue to monitor.  Not appear infected today.  Doubt fungal infection given the rest of the nail appears to be normal.  Discussed with her she may lose her nail, but hopefully since this is such a small area this will not occur.  If worsening, redness or swelling occur follow-up for reevaluation   Return in about 17 weeks (around 10/06/2019) for CMC (30 min).  Will need follow up on her chronic conditions which were not covered today and will be new to this provider,  per her routine schedule and prior to needing refills early September.   Orders Placed This Encounter  Procedures  . ANA, IFA Comprehensive Panel-([facility])  . Rheumatoid Factor  . Cyclic citrul peptide antibody, IgG ([facility])  . Sedimentation rate  . C-reactive protein   Meds ordered this encounter  Medications  . meloxicam (MOBIC) 15 MG tablet    Sig: Take 1 tablet (15 mg total) by mouth daily.    Dispense:  90 tablet    Refill:  1  . mupirocin cream (BACTROBAN) 2 %    Sig: Apply 1 application topically 2 (two) times daily.    Dispense:  15 g    Refill:  0    May substitute ointment if cheaper or formulary   Referral Orders  No referral(s) requested today     Note is dictated utilizing voice recognition software. Although note has been proof read prior to signing, occasional typographical errors still can be missed. If any questions arise, please do not hesitate to call for verification.  Electronically signed by: Felix Pacini, DO [facility]

## 2019-06-09 NOTE — Patient Instructions (Addendum)
Nice to meet you today. Follow up early September  On your chronic conditions- prior to needing referrals.   Take mobic daily with food for arthritis control.   We will call with lab results.    Arthritis Arthritis means joint pain. It can also mean joint disease. A joint is a place where bones come together. There are more than 100 types of arthritis. What are the causes? This condition may be caused by:  Wear and tear of a joint. This is the most common cause.  A lot of acid in the blood, which leads to pain in the joint (gout).  Pain and swelling (inflammation) in a joint.  Infection of a joint.  Injuries in the joint.  A reaction to medicines (allergy). In some cases, the cause may not be known. What are the signs or symptoms? Symptoms of this condition include:  Redness at a joint.  Swelling at a joint.  Stiffness at a joint.  Warmth coming from the joint.  A fever.  A feeling of being sick. How is this treated? This condition may be treated with:  Treating the cause, if it is known.  Rest.  Raising (elevating) the joint.  Putting cold or hot packs on the joint.  Medicines to treat symptoms and reduce pain and swelling.  Shots of medicines (cortisone) into the joint. You may also be told to make changes in your life, such as doing exercises and losing weight. Follow these instructions at home: Medicines  Take over-the-counter and prescription medicines only as told by your doctor.  Do not take aspirin for pain if your doctor says that you may have gout. Activity  Rest your joint if your doctor tells you to.  Avoid activities that make the pain worse.  Exercise your joint regularly as told by your doctor. Try doing exercises like: ? Swimming. ? Water aerobics. ? Biking. ? Walking. Managing pain, stiffness, and swelling      If told, put ice on the affected area. ? Put ice in a plastic bag. ? Place a towel between your skin and the  bag. ? Leave the ice on for 20 minutes, 2-3 times per day.  If your joint is swollen, raise (elevate) it above the level of your heart if told by your doctor.  If your joint feels stiff in the morning, try taking a warm shower.  If told, put heat on the affected area. Do this as often as told by your doctor. Use the heat source that your doctor recommends, such as a moist heat pack or a heating pad. If you have diabetes, do not apply heat without asking your doctor. To apply heat: ? Place a towel between your skin and the heat source. ? Leave the heat on for 20-30 minutes. ? Remove the heat if your skin turns bright red. This is very important if you are unable to feel pain, heat, or cold. You may have a greater risk of getting burned. General instructions  Do not use any products that contain nicotine or tobacco, such as cigarettes, e-cigarettes, and chewing tobacco. If you need help quitting, ask your doctor.  Keep all follow-up visits as told by your doctor. This is important. Contact a doctor if:  The pain gets worse.  You have a fever. Get help right away if:  You have very bad pain in your joint.  You have swelling in your joint.  Your joint is red.  Many joints become painful and swollen.  You have very bad back pain.  Your leg is very weak.  You cannot control your pee (urine) or poop (stool). Summary  Arthritis means joint pain. It can also mean joint disease. A joint is a place where bones come together.  The most common cause of this condition is wear and tear of a joint.  Symptoms of this condition include redness, swelling, or stiffness of the joint.  This condition is treated with rest, raising the joint, medicines, and putting cold or hot packs on the joint.  Follow your doctor's instructions about medicines, activity, exercises, and other home care treatments. This information is not intended to replace advice given to you by your health care provider.  Make sure you discuss any questions you have with your health care provider. Document Revised: 12/23/2017 Document Reviewed: 12/23/2017 Elsevier Patient Education  2020 [employer].   Please help [place] help you:  We are honored you have chosen [facility] for your Primary Care home. Below you will find basic instructions that you may need to access in the future. Please help [place] help you by reading the instructions, which cover many of the frequent questions we experience.   Prescription refills and request:  -In order to allow more efficient response time, please call your pharmacy for all refills. They will forward the request electronically to [place]. This allows for the quickest possible response. Request left on a nurse line can take longer to refill, since these are checked as time allows between office patients and other phone calls.  - refill request can take up to 3-5 working days to complete.  - If request is sent electronically and request is appropiate, it is usually completed in 1-2 business days.  - all patients will need to be seen routinely for all chronic medical conditions requiring prescription medications (see follow-up below). If you are overdue for follow up on your condition, you will be asked to make an appointment and we will call in enough medication to cover you until your appointment (up to 30 days).  - all controlled substances will require a face to face visit to request/refill.  - if you desire your prescriptions to go through a new pharmacy, and have an active script at original pharmacy, you will need to call your pharmacy and have scripts transferred to new pharmacy. This is completed between the pharmacy locations and not by your provider.    Results: Our office handles many outgoing and incoming calls daily. If we have not contacted you within 1 week about your results, please check your mychart to see if there is a message first and if not, then contact our  office.  In helping with this matter, you help decrease call volume, and therefore allow [place] to be able to respond to patients needs more efficiently.  We will always attempt to call you with results,  normal or abnormal. However, if we are unable to reach you we will send a message in your my chart with results.   Acute office visits (sick visit):  An acute visit is intended for a new problem and are scheduled in shorter time slots to allow schedule openings for patients with new problems. This is the appropriate visit to discuss a new problem. Problems will not be addressed by phone call or Echart message. Appointment is needed if requesting treatment. In order to provide you with excellent quality medical care with proper time for you to explain your problem, have an exam and  receive treatment with instructions, these appointments should be limited to one new problem per visit. If you experience a new problem, in which you desire to be addressed, please make an acute office visit, we save openings on the schedule to accommodate you. Please do not save your new problem for any other type of visit, let us take care of it properly and quickly for you.   Follow up visits:  Depending on your condition(s) your provider will need to see you routinely in order to provide you with quality care and prescribe medication(s). Most chronic conditions (Example: hypertension, Diabetes, depression/anxiety... etc), require visits a couple times a year. Your provider will instruct you on proper follow up for your personal medical conditions and history. Please make certain to make follow up appointments for your condition as instructed. Failing to do so could result in lapse in your medication treatment/refills. If you request a refill, and are overdue to be seen on a condition, we will always provide you with a 30 day script (once) to allow you time to schedule.    Medicare wellness (well visit): - we have a wonderful  Nurse Selena Batten), that will meet with you and provide you will yearly medicare wellness visits. These visits should occur yearly (can not be scheduled less than 1 calendar year apart) and cover preventive health, immunizations, advance directives and screenings you are entitled to yearly through your medicare benefits. Do not miss out on your entitled benefits, this is when medicare will pay for these benefits to be ordered for you.  These are strongly encouraged by your provider and is the appropriate type of visit to make certain you are up to date with all preventive health benefits. If you have not had your medicare wellness exam in the last 12 months, please make certain to schedule one by calling the office and schedule your medicare wellness with Selena Batten as soon as possible.   Yearly physical (well visit):  - Adults are recommended to be seen yearly for physicals. Check with your insurance and date of your last physical, most insurances require one calendar year between physicals. Physicals include all preventive health topics, screenings, medical exam and labs that are appropriate for gender/age and history. You may have fasting labs needed at this visit. This is a well visit (not a sick visit), new problems should not be covered during this visit (see acute visit).  - Pediatric patients are seen more frequently when they are younger. Your provider will advise you on well child visit timing that is appropriate for your their age. - This is not a medicare wellness visit. Medicare wellness exams do not have an exam portion to the visit. Some medicare companies allow for a physical, some do not allow a yearly physical. If your medicare allows a yearly physical you can schedule the medicare wellness with our nurse Selena Batten and have your physical with your provider after, on the same day. Please check with insurance for your full benefits.   Late Policy/No Shows:  - all new patients should arrive 15-30 minutes earlier  than appointment to allow [place] time  to  obtain all personal demographics,  insurance information and for you to complete office paperwork. - All established patients should arrive 10-15 minutes earlier than appointment time to update all information and be checked in .  - In our best efforts to run on time, if you are late for your appointment you will be asked to either reschedule or if able, we  will work you back into the schedule. There will be a wait time to work you back in the schedule,  depending on availability.  - If you are unable to make it to your appointment as scheduled, please call 24 hours ahead of time to allow [place] to fill the time slot with someone else who needs to be seen. If you do not cancel your appointment ahead of time, you may be charged a no show fee.

## 2019-06-10 LAB — ANA, IFA COMPREHENSIVE PANEL
Anti Nuclear Antibody (ANA): NEGATIVE
ENA SM Ab Ser-aCnc: <1 AI
SM/RNP: <1 AI
SSA (Ro) (ENA) Antibody, IgG: <1 AI
SSB (La) (ENA) Antibody, IgG: <1 AI
Scleroderma (Scl-70) (ENA) Antibody, IgG: <1 AI
ds DNA Ab: <1 IU/mL

## 2019-06-10 LAB — CYCLIC CITRUL PEPTIDE ANTIBODY, IGG: Cyclic Citrullin Peptide Ab: <16 UNITS

## 2019-06-10 LAB — RHEUMATOID FACTOR: Rheumatoid fact SerPl-aCnc: <14 IU/mL (ref <14)

## 2019-06-11 ENCOUNTER — Encounter: Payer: Self-pay | Admitting: Family Medicine

## 2019-06-11 DIAGNOSIS — R7303 Prediabetes: Secondary | ICD-10-CM | POA: Insufficient documentation

## 2019-06-11 DIAGNOSIS — F32A Depression, unspecified: Secondary | ICD-10-CM | POA: Insufficient documentation

## 2019-06-12 ENCOUNTER — Encounter: Payer: Self-pay | Admitting: Family Medicine

## 2019-07-15 ENCOUNTER — Telehealth: Payer: Self-pay

## 2019-07-15 NOTE — Telephone Encounter (Signed)
Patient also needing to know when was her last physical from records that were faxed over

## 2019-07-15 NOTE — Telephone Encounter (Signed)
Last CPE was 10/07/2018. Pt was told to F/U when needing refills and according to records at [facility] they sent in a 90 day supply plus 1 refill on 03/2019. Crestor was sent by Dr Anne Fu, cardiologist. Pt was called and told that she should have refills at the pharmacy for this medication and according to Dr Kirtland Bouchard notes she would need to have appt to get refills on these. Pt was seen last visit for Est care and arthritis, [facility].

## 2019-07-15 NOTE — Telephone Encounter (Signed)
Patient called in needing refills with new Rx sent to pharmacy    sertraline (ZOLOFT) 100 MG tablet  lisinopril-hydrochlorothiazide (ZESTORETIC) 20-12.5 MG tablet  rosuvastatin (CRESTOR) 10 MG tablet    [id number] - [place], [place] - [address]

## 2019-07-22 ENCOUNTER — Encounter: Payer: Self-pay | Admitting: Family Medicine

## 2019-07-22 ENCOUNTER — Ambulatory Visit (INDEPENDENT_AMBULATORY_CARE_PROVIDER_SITE_OTHER): Payer: Managed Care, Other (non HMO) | Admitting: Family Medicine

## 2019-07-22 ENCOUNTER — Other Ambulatory Visit: Payer: Self-pay

## 2019-07-22 VITALS — BP 133/75 | HR 58 | Temp 97.2°F | Resp 17 | Ht 64.0 in | Wt 199.0 lb

## 2019-07-22 DIAGNOSIS — I10 Essential (primary) hypertension: Secondary | ICD-10-CM

## 2019-07-22 DIAGNOSIS — F329 Major depressive disorder, single episode, unspecified: Secondary | ICD-10-CM | POA: Diagnosis not present

## 2019-07-22 DIAGNOSIS — R7303 Prediabetes: Secondary | ICD-10-CM

## 2019-07-22 DIAGNOSIS — I25118 Atherosclerotic heart disease of native coronary artery with other forms of angina pectoris: Secondary | ICD-10-CM

## 2019-07-22 DIAGNOSIS — F5101 Primary insomnia: Secondary | ICD-10-CM

## 2019-07-22 DIAGNOSIS — R002 Palpitations: Secondary | ICD-10-CM

## 2019-07-22 DIAGNOSIS — E7800 Pure hypercholesterolemia, unspecified: Secondary | ICD-10-CM | POA: Diagnosis not present

## 2019-07-22 DIAGNOSIS — Z955 Presence of coronary angioplasty implant and graft: Secondary | ICD-10-CM | POA: Diagnosis not present

## 2019-07-22 DIAGNOSIS — I200 Unstable angina: Secondary | ICD-10-CM

## 2019-07-22 DIAGNOSIS — R29818 Other symptoms and signs involving the nervous system: Secondary | ICD-10-CM

## 2019-07-22 DIAGNOSIS — J309 Allergic rhinitis, unspecified: Secondary | ICD-10-CM

## 2019-07-22 DIAGNOSIS — M1990 Unspecified osteoarthritis, unspecified site: Secondary | ICD-10-CM

## 2019-07-22 LAB — POCT GLYCOSYLATED HEMOGLOBIN (HGB A1C)
HbA1c POC (<> result, manual entry): 5.9 % (ref 4.0–5.6)
HbA1c, POC (controlled diabetic range): 5.9 % (ref 0.0–7.0)
HbA1c, POC (prediabetic range): 5.9 % (ref 5.7–6.4)
Hemoglobin A1C: 5.9 % — AB (ref 4.0–5.6)

## 2019-07-22 MED ORDER — MONTELUKAST SODIUM 10 MG PO TABS: 10.0000 mg | ORAL_TABLET | Freq: Every day | ORAL | 1 refills | Status: DC

## 2019-07-22 MED ORDER — ROSUVASTATIN CALCIUM 10 MG PO TABS: 10.0000 mg | ORAL_TABLET | Freq: Every day | ORAL | 3 refills | Status: DC

## 2019-07-22 MED ORDER — AMLODIPINE BESYLATE 5 MG PO TABS: 5.0000 mg | ORAL_TABLET | Freq: Every day | ORAL | 1 refills | Status: DC

## 2019-07-22 MED ORDER — FLUTICASONE PROPIONATE 50 MCG/ACT NA SUSP: 2.0000 | Freq: Two times a day (BID) | NASAL | 11 refills | Status: DC | PRN

## 2019-07-22 MED ORDER — ZOLPIDEM TARTRATE 10 MG PO TABS: 10.0000 mg | ORAL_TABLET | Freq: Every day | ORAL | 1 refills | Status: DC

## 2019-07-22 MED ORDER — MELOXICAM 15 MG PO TABS: 15.0000 mg | ORAL_TABLET | Freq: Every day | ORAL | 1 refills | Status: DC

## 2019-07-22 MED ORDER — SERTRALINE HCL 100 MG PO TABS: 100.0000 mg | ORAL_TABLET | Freq: Every day | ORAL | 1 refills | Status: DC

## 2019-07-22 MED ORDER — LISINOPRIL-HYDROCHLOROTHIAZIDE 20-12.5 MG PO TABS: 1.0000 | ORAL_TABLET | Freq: Every day | ORAL | 1 refills | Status: DC

## 2019-07-22 MED ORDER — CARVEDILOL 6.25 MG PO TABS: 6.2500 mg | ORAL_TABLET | Freq: Two times a day (BID) | ORAL | 1 refills | Status: DC

## 2019-07-22 NOTE — Patient Instructions (Addendum)
Physical in December with fasting labs and we will cover your chronic problems same day as long as stable.  I have refilled all meds except imdur and nitro.>> cardiology.   Have a great summer.   It was nice to see you today.

## 2019-07-22 NOTE — Progress Notes (Signed)
Patient ID: Kristie Owens, female  DOB: 31-Aug-1955, 64 y.o.   MRN: [id number] Patient Care Team    Relationship Specialty Notifications Start End  Ma Hillock, DO PCP - General Family Medicine  06/09/19   Jerline Pain, MD Consulting Physician Cardiology  06/08/19   Otelia Sergeant, OD Referring Physician   06/11/19   Ralene Muskrat Physician Assistant Chiropractic Medicine  06/11/19     Chief Complaint  Patient presents with  . Diabetes  . Depression    Subjective:  Kristie Owens is a 64 y.o.  female present for Hypertension/hyperlipidemia/coronary artery disease/TIA: Pt reports compliance with lisinopril-HCTZ 20-12.5, amlodipine 5 mg, Coreg 6.25 mg twice daily, Crestor 10 mg daily, Imdur 60 mg daily. Blood pressures ranges at home within normal limits. Patient denies chest pain, shortness of breath or lower extremity edema. Pt takes a daily baby ASA. Pt is  prescribed statin.  Patient is established with Dr. Marlou Porch for cardiology.  Has a history of coronary artery disease status post heart catheterization in December 2012 and May 2018 demonstrating widely patent LAD and RCA stent, mild irregularity of the ostium of the diagonal where PTCA was felt to be high risk RF: Hypertension, hyperlipidemia, obesity, family history of heart disease  Cardiac catheterization on 06/04/16:  Widely patent proximal and distal LAD stents. 40% ostial narrowing in the first diagonal.  Patent mid right coronary stents. Proximal 40% RCA eccentric narrowing.  Circumflex is a small vessel with 40-50% first obtuse marginal.  Ramus intermedius (or first obtuse marginal depending upon categorization) contains 40% proximal narrowing.  Normal LV function. Elevated LVEDP at 23 mmHg. EF 55%.  Depression/anxiety/insomnia: Patient has a history of depression with anxiety as well as insomnia.  She has been prescribed Zoloft 100 mg daily and Ambien 10 mg nightly.  She feels this is working  effectively for her.  She has not tried other meds for her insomnia.  She is aware Ambien dose will need to be decreased as she ages for safety.  Allergic rhinitis: Patient reports compliance with Singulair and Flonase nasal spray.  She reports she still has allergy symptoms.  She had been on a daily allergy medicine however she felt that made her tired.  Prediabetes: Patient has history of mildly elevated A1c in the diabetic range of 6.6.  She has controlled it with diet and exercise.  She is not on medication.  Bilateral Knee pain: Patient worked up for inflammatory arthritis was normal last appointment.  She is seeing benefit from using Mobic daily.   Depression screen [facility] 2/9 07/22/2019 06/09/2019 10/30/2017  Decreased Interest 0 0 0  Down, Depressed, Hopeless 0 0 0  PHQ - 2 Score 0 0 0  Altered sleeping 0 2 -  Tired, decreased energy 0 0 -  Change in appetite 0 3 -  Feeling bad or failure about yourself  0 0 -  Trouble concentrating 0 0 -  Moving slowly or fidgety/restless 0 0 -  Suicidal thoughts 0 0 -  PHQ-9 Score 0 5 -  Difficult doing work/chores Not difficult at all Not difficult at all -   No flowsheet data found.     Fall Risk  10/30/2017  Falls in the past year? No   Immunization History  Administered Date(s) Administered  . Influenza,inj,Quad PF,6+ Mos 12/15/2013, 12/13/2016  . Influenza-Unspecified 11/21/2018  . Moderna SARS-COVID-2 Vaccination 02/19/2019, 03/27/2019  . Pneumococcal Conjugate-13 11/21/2017  . Pneumococcal Polysaccharide-23 11/25/2018  . Tdap  08/10/2013    No exam data present  Past Medical History:  Diagnosis Date  . Achilles bursitis or tendinitis 11/11/2012   right   . Allergy   . Angina at rest [facility])    Dr Marlou Porch  . CAD (coronary artery disease), native coronary artery    DES to LAD in 2 areas, one at the bifurcation of a diagonal branch and also in the mid to distal region. Her diagonal branch was also ballooned . She has a DES to RCA  10/10. Her circumflex artery mild ostial disease.  . Chicken pox   . Depression   . DM (diabetes mellitus) ([place])   . Hx of cardiovascular stress test    ETT- Myoview (2/16):  No ischemia, EF 69%, normal study  . Hyperlipidemia   . Hypertension   . Lumbar pain   . OSA on CPAP    Mild, mild hypoxia 83% AHI -11.1, O2 minimum 83%  . Peroneal tendinitis of left lower extremity 11/11/2012   Left with tear.   . Tonsillar calculus 08/12/2018   Allergies  Allergen Reactions  . Sulfa Antibiotics Other (See Comments)    From childhood- reaction not recalled  . Wellbutrin [Bupropion] Nausea Only    Nausea and headaches   . Prednisone Anxiety and Other (See Comments)    "Made me not feel like myself, altered"   Past Surgical History:  Procedure Laterality Date  . BREAST EXCISIONAL BIOPSY Right 1995  . CAROTID STENT    . CESAREAN SECTION  1983  . FOOT SURGERY    . LEFT HEART CATH AND CORONARY ANGIOGRAPHY N/A 06/04/2016   Procedure: Left Heart Cath and Coronary Angiography;  Surgeon: Belva Crome, MD;  Location: [facility];  Service: Cardiovascular;  Laterality: N/A;  . LEFT HEART CATHETERIZATION WITH CORONARY ANGIOGRAM N/A 01/16/2011   Procedure: LEFT HEART CATHETERIZATION WITH CORONARY ANGIOGRAM;  Surgeon: Candee Furbish, MD;  Location[facility] CATH LAB;  Service: Cardiovascular;  Laterality: N/A;  . NOSE SURGERY     Family History  Problem Relation Age of Onset  . Hyperlipidemia Brother   . Hypertension Brother   . Intellectual disability Brother   . Kidney disease Brother   . Hypertension Mother   . Heart disease Mother   . COPD Mother   . Ulcers Mother   . Lung cancer Father   . Heart failure Paternal Grandmother   . Heart attack Paternal Grandmother   . Hypertension Paternal Grandmother   . Hyperlipidemia Paternal Grandmother   . Heart disease Paternal Grandmother   . Heart attack Brother   . Asthma Brother   . Early death Brother 3  . Hyperlipidemia Brother   .  Hypertension Brother   . Intellectual disability Brother   . Kidney disease Brother   . Heart disease Brother   . Hypertension Sister   . Diabetes Sister   . Early death Maternal Grandmother   . Tuberculosis Maternal Grandmother   . Lung cancer Maternal Grandfather   . Suicidality Maternal Grandfather   . Early death Maternal Grandfather   . Heart disease Paternal Grandfather   . Hyperlipidemia Paternal Grandfather   . Hypertension Paternal Grandfather   . Heart attack Other   . Arthritis Sister   . Depression Sister   . Miscarriages / Stillbirths Sister   . Alcohol abuse Brother   . Stroke Neg Hx    Social History   Social History Narrative   Marital status/children/pets: Married.  One child.  Education/employment: Retired.  Associates degree in college.   Safety:      -Wears a bicycle helmet riding a bike: Yes     -smoke alarm in the home:Yes     - wears seatbelt: Yes     - Feels safe in their relationships: Yes    Allergies as of 07/22/2019      Reactions   Sulfa Antibiotics Other (See Comments)   From childhood- reaction not recalled   Wellbutrin [bupropion] Nausea Only   Nausea and headaches    Prednisone Anxiety, Other (See Comments)   "Made me not feel like myself, altered"      Medication List       Accurate as of July 22, 2019 11:59 PM. If you have any questions, ask your nurse or doctor.        STOP taking these medications   fluocinonide 0.05 % external solution Commonly known as: LIDEX Stopped by: Howard Pouch, DO   mupirocin cream 2 % Commonly known as: Bactroban Stopped by: Howard Pouch, DO     TAKE these medications   amLODipine 5 MG tablet Commonly known as: NORVASC Take 1 tablet (5 mg total) by mouth daily.   aspirin EC 81 MG tablet Take 81 mg by mouth daily.   carvedilol 6.25 MG tablet Commonly known as: COREG Take 1 tablet (6.25 mg total) by mouth 2 (two) times daily with a meal.   fluticasone 50 MCG/ACT nasal spray Commonly  known as: FLONASE Place 2 sprays into both nostrils 2 (two) times daily as needed for allergies or rhinitis.   isosorbide mononitrate 60 MG 24 hr tablet Commonly known as: IMDUR TAKE 1 TABLET BY MOUTH EVERY DAY   lisinopril-hydrochlorothiazide 20-12.5 MG tablet Commonly known as: ZESTORETIC Take 1 tablet by mouth daily.   meloxicam 15 MG tablet Commonly known as: MOBIC Take 1 tablet (15 mg total) by mouth daily.   montelukast 10 MG tablet Commonly known as: SINGULAIR Take 1 tablet (10 mg total) by mouth at bedtime.   nitroGLYCERIN 0.4 MG SL tablet Commonly known as: NITROSTAT Place 1 tablet (0.4 mg total) under the tongue every 5 (five) minutes as needed for chest pain.   OMEPRAZOLE PO Take 1 tablet by mouth as needed.   PATADAY OP Place 1 drop into both eyes daily as needed (for itching or dryness).   rosuvastatin 10 MG tablet Commonly known as: Crestor Take 1 tablet (10 mg total) by mouth at bedtime.   sertraline 100 MG tablet Commonly known as: ZOLOFT Take 1 tablet (100 mg total) by mouth daily.   zolpidem 10 MG tablet Commonly known as: AMBIEN Take 1 tablet (10 mg total) by mouth at bedtime.       All past medical history, surgical history, allergies, family history, immunizations andmedications were updated in the EMR today and reviewed under the history and medication portions of their EMR.      ROS: 14 pt review of systems performed and negative (unless mentioned in an HPI)  Objective: BP 133/75 (BP Location: Left Arm, Patient Position: Sitting, Cuff Size: Normal)   Pulse (!) 58   Temp (!) 97.2 F (36.2 C) (Temporal)   Resp 17   Ht 5' 4"  (1.626 m)   Wt 199 lb (90.3 kg)   SpO2 97%   BMI 34.16 kg/m   Gen: Afebrile. No acute distress.  Nontoxic.  Very pleasant obese female. HENT: AT. [place].  Eyes:Pupils Equal Round Reactive to light, Extraocular movements intact,  Conjunctiva without  redness, discharge or icterus. Neck/lymp/endocrine: Supple, no  lymphadenopathy, no thyromegaly CV: RRR , no edema, +2/4 P posterior tibialis pulses Chest: CTAB, no wheeze or crackles Abd: Soft. NTND. BS present.  No masses palpated.  Skin: No rashes, purpura or petechiae.  Neuro:  Normal gait. PERLA. EOMi. Alert. Oriented x3 Psych: Normal affect, dress and demeanor. Normal speech. Normal thought content and judgment.  Assessment/plan: Kristie Owens is a 64 y.o. female present for [facility] osteoarthritis -Continue Mobic qd with food.  - continue  OTC voltaren gel/cream.  - discussed cherry tart extract and tumeric natural supplements and their SE potentials  - ANA, RF, CCP, ESR, CRP > WNL  Major depressive disorder, remission status unspecified, unspecified whether recurrent/insomnia Stable. Continue Zoloft 100 mg daily. Continue Ambien 10 mg nightly-for now.  Patient aware she ages we will need to find an alternative or lower dose. [place] controlled substance database reviewed today. Contract will need to be signed.  Prediabetes A1c 5.9 today.  Patient encouraged to continue exercise and dietary modifications.  Consider low-dose Metformin start for metabolic syndrome if Y0V increases in the future. - POCT glycosylated hemoglobin (Hb A1C)  Essential hypertension/hyperlipidemia/Presence of coronary angioplasty implant and graft/Atherosclerosis of native coronary artery of native heart with other form of angina pectoris (HCC)/Intermediate coronary syndrome (HCC)/ Palpitations/Transient neurologic deficit/Morbid obesity (HCC) Stable. Continue daily exercise. Continue low-sodium/heart healthy diet. Continue Imdur> prescribed by cardiology. Continue amlodipine 5 mg daily Continue Coreg 6.25 mg twice daily Continue lisinopril-HCTZ 20-12.5 mg daily Continue Crestor 10 mg daily Continue baby aspirin daily  Allergic rhinitis, unspecified seasonality, unspecified trigger Patient encouraged to try Allegra OTC for better coverage that is  nonsedating Continue Flonase Continue Singulair   Return in about 23 weeks (around 12/30/2019) for CPE (30 min).    Orders Placed This Encounter  Procedures  . POCT glycosylated hemoglobin (Hb A1C)   Meds ordered this encounter  Medications  . sertraline (ZOLOFT) 100 MG tablet    Sig: Take 1 tablet (100 mg total) by mouth daily.    Dispense:  90 tablet    Refill:  1  . rosuvastatin (CRESTOR) 10 MG tablet    Sig: Take 1 tablet (10 mg total) by mouth at bedtime.    Dispense:  90 tablet    Refill:  3  . montelukast (SINGULAIR) 10 MG tablet    Sig: Take 1 tablet (10 mg total) by mouth at bedtime.    Dispense:  90 tablet    Refill:  1  . meloxicam (MOBIC) 15 MG tablet    Sig: Take 1 tablet (15 mg total) by mouth daily.    Dispense:  90 tablet    Refill:  1  . lisinopril-hydrochlorothiazide (ZESTORETIC) 20-12.5 MG tablet    Sig: Take 1 tablet by mouth daily.    Dispense:  90 tablet    Refill:  1  . fluticasone (FLONASE) 50 MCG/ACT nasal spray    Sig: Place 2 sprays into both nostrils 2 (two) times daily as needed for allergies or rhinitis.    Dispense:  16 g    Refill:  11  . amLODipine (NORVASC) 5 MG tablet    Sig: Take 1 tablet (5 mg total) by mouth daily.    Dispense:  90 tablet    Refill:  1  . carvedilol (COREG) 6.25 MG tablet    Sig: Take 1 tablet (6.25 mg total) by mouth 2 (two) times daily with a meal.    Dispense:  180 tablet  Refill:  1    pls discontinue any other rx for Carvedilol  . zolpidem (AMBIEN) 10 MG tablet    Sig: Take 1 tablet (10 mg total) by mouth at bedtime.    Dispense:  90 tablet    Refill:  1   Referral Orders  No referral(s) requested today     Note is dictated utilizing voice recognition software. Although note has been proof read prior to signing, occasional typographical errors still can be missed. If any questions arise, please do not hesitate to call for verification.  Electronically signed by: Howard Pouch, DO [place]

## 2019-07-27 DIAGNOSIS — J3089 Other allergic rhinitis: Secondary | ICD-10-CM | POA: Insufficient documentation

## 2019-07-27 DIAGNOSIS — G4700 Insomnia, unspecified: Secondary | ICD-10-CM

## 2019-07-27 DIAGNOSIS — M1990 Unspecified osteoarthritis, unspecified site: Secondary | ICD-10-CM | POA: Insufficient documentation

## 2019-07-27 HISTORY — DX: Insomnia, unspecified: G47.00

## 2019-10-06 ENCOUNTER — Ambulatory Visit: Payer: Managed Care, Other (non HMO) | Admitting: Family Medicine

## 2019-10-19 ENCOUNTER — Other Ambulatory Visit: Payer: Self-pay

## 2019-10-19 ENCOUNTER — Ambulatory Visit (INDEPENDENT_AMBULATORY_CARE_PROVIDER_SITE_OTHER): Payer: Managed Care, Other (non HMO) | Admitting: Family Medicine

## 2019-10-19 ENCOUNTER — Encounter: Payer: Self-pay | Admitting: Family Medicine

## 2019-10-19 VITALS — BP 113/65 | HR 67 | Temp 98.4°F | Ht 64.0 in | Wt 205.0 lb

## 2019-10-19 DIAGNOSIS — Z23 Encounter for immunization: Secondary | ICD-10-CM | POA: Diagnosis not present

## 2019-10-19 DIAGNOSIS — N898 Other specified noninflammatory disorders of vagina: Secondary | ICD-10-CM

## 2019-10-19 DIAGNOSIS — R350 Frequency of micturition: Secondary | ICD-10-CM | POA: Diagnosis not present

## 2019-10-19 LAB — POC URINALSYSI DIPSTICK (AUTOMATED)
Bilirubin, UA: NEGATIVE
Blood, UA: NEGATIVE
Glucose, UA: NEGATIVE
Ketones, UA: NEGATIVE
Leukocytes, UA: NEGATIVE
Nitrite, UA: NEGATIVE
Protein, UA: NEGATIVE
Spec Grav, UA: <=1.005 — AB (ref 1.010–1.025)
Urobilinogen, UA: 0.2 E.U./dL
pH, UA: 6 (ref 5.0–8.0)

## 2019-10-19 MED ORDER — CEPHALEXIN 500 MG PO CAPS
500.0000 mg | ORAL_CAPSULE | Freq: Three times a day (TID) | ORAL | 0 refills | Status: DC
Start: 2019-10-19 — End: 2019-11-17

## 2019-10-19 MED ORDER — FLUCONAZOLE 150 MG PO TABS: 150.0000 mg | ORAL_TABLET | Freq: Once | ORAL | 0 refills | Status: AC

## 2019-10-19 NOTE — Progress Notes (Signed)
This visit occurred during the SARS-CoV-2 public health emergency.  Safety protocols were in place, including screening questions prior to the visit, additional usage of staff PPE, and extensive cleaning of exam room while observing appropriate contact time as indicated for disinfecting solutions.    Kristie Owens , 11/08/1955, 64 y.o., female MRN: [id number] Patient Care Team    Relationship Specialty Notifications Start End  Natalia LeatherwoodKuneff, Nakyla Bracco A, DO PCP - General Family Medicine  06/09/19   Jake BatheSkains, Mark C, MD Consulting Physician Cardiology  06/08/19   Jill SideBarts, Sarah H, OD Referring Physician   06/11/19   Moody BruinsLineberry, Keith H Physician Assistant Chiropractic Medicine  06/11/19     Chief Complaint  Patient presents with  . Dysuria    pt c/o lower abdominal pain, pressure, urine freq and urgency and itching on the vaginal area x 2 days     Subjective: Pt presents for an OV with complaints of urinary frequency and burning  of 4 days duration. No vaginal discharge. She has had a UTI in the past. She took azo- Friday but it dd not help. No fever, chills or nausea.   Pt is allergic to Sulfa.     Depression screen [facility] 2/9 07/22/2019 06/09/2019 10/30/2017  Decreased Interest 0 0 0  Down, Depressed, Hopeless 0 0 0  PHQ - 2 Score 0 0 0  Altered sleeping 0 2 -  Tired, decreased energy 0 0 -  Change in appetite 0 3 -  Feeling bad or failure about yourself  0 0 -  Trouble concentrating 0 0 -  Moving slowly or fidgety/restless 0 0 -  Suicidal thoughts 0 0 -  PHQ-9 Score 0 5 -  Difficult doing work/chores Not difficult at all Not difficult at all -    Allergies  Allergen Reactions  . Sulfa Antibiotics Other (See Comments)    From childhood- reaction not recalled  . Wellbutrin [Bupropion] Nausea Only    Nausea and headaches   . Prednisone Anxiety and Other (See Comments)    "Made me not feel like myself, altered"   Social History   Social History Narrative   Marital  status/children/pets: Married.  One child.   Education/employment: Retired.  Associates degree in college.   Safety:      -Wears a bicycle helmet riding a bike: Yes     -smoke alarm in the home:Yes     - wears seatbelt: Yes     - Feels safe in their relationships: Yes   Past Medical History:  Diagnosis Date  . Achilles bursitis or tendinitis 11/11/2012   right   . Allergy   . Angina at rest [facility](HCC)    Dr Anne FuSkains  . CAD (coronary artery disease), native coronary artery    DES to LAD in 2 areas, one at the bifurcation of a diagonal branch and also in the mid to distal region. Her diagonal branch was also ballooned . She has a DES to RCA 10/10. Her circumflex artery mild ostial disease.  . Chicken pox   . Depression   . DM (diabetes mellitus) (HCC)   . Hx of cardiovascular stress test    ETT- Myoview (2/16):  No ischemia, EF 69%, normal study  . Hyperlipidemia   . Hypertension   . Lumbar pain   . OSA on CPAP    Mild, mild hypoxia 83% AHI -11.1, O2 minimum 83%  . Peroneal tendinitis of left lower extremity 11/11/2012   Left with tear.   .[name]  Tonsillar calculus 08/12/2018   Past Surgical History:  Procedure Laterality Date  . BREAST EXCISIONAL BIOPSY Right 1995  . CAROTID STENT    . CESAREAN SECTION  1983  . FOOT SURGERY    . LEFT HEART CATH AND CORONARY ANGIOGRAPHY N/A 06/04/2016   Procedure: Left Heart Cath and Coronary Angiography;  Surgeon: Lyn Records, MD;  Location: [facility] INVASIVE CV LAB;  Service: Cardiovascular;  Laterality: N/A;  . LEFT HEART CATHETERIZATION WITH CORONARY ANGIOGRAM N/A 01/16/2011   Procedure: LEFT HEART CATHETERIZATION WITH CORONARY ANGIOGRAM;  Surgeon: Donato Schultz, MD;  Location: [facility] CATH LAB;  Service: Cardiovascular;  Laterality: N/A;  . NOSE SURGERY     Family History  Problem Relation Age of Onset  . Hyperlipidemia Brother   . Hypertension Brother   . Intellectual disability Brother   . Kidney disease Brother   . Hypertension Mother   . Heart disease  Mother   . COPD Mother   . Ulcers Mother   . Lung cancer Father   . Heart failure Paternal Grandmother   . Heart attack Paternal Grandmother   . Hypertension Paternal Grandmother   . Hyperlipidemia Paternal Grandmother   . Heart disease Paternal Grandmother   . Heart attack Brother   . Asthma Brother   . Early death Brother 34  . Hyperlipidemia Brother   . Hypertension Brother   . Intellectual disability Brother   . Kidney disease Brother   . Heart disease Brother   . Hypertension Sister   . Diabetes Sister   . Early death Maternal Grandmother   . Tuberculosis Maternal Grandmother   . Lung cancer Maternal Grandfather   . Suicidality Maternal Grandfather   . Early death Maternal Grandfather   . Heart disease Paternal Grandfather   . Hyperlipidemia Paternal Grandfather   . Hypertension Paternal Grandfather   . Heart attack Other   . Arthritis Sister   . Depression Sister   . Miscarriages / Stillbirths Sister   . Alcohol abuse Brother   . Stroke Neg Hx    Allergies as of 10/19/2019      Reactions   Sulfa Antibiotics Other (See Comments)   From childhood- reaction not recalled   Wellbutrin [bupropion] Nausea Only   Nausea and headaches    Prednisone Anxiety, Other (See Comments)   "Made me not feel like myself, altered"      Medication List       Accurate as of October 19, 2019  2:34 PM. If you have any questions, ask your nurse or doctor.        amLODipine 5 MG tablet Commonly known as: NORVASC Take 1 tablet (5 mg total) by mouth daily.   aspirin EC 81 MG tablet Take 81 mg by mouth daily.   carvedilol 6.25 MG tablet Commonly known as: COREG Take 1 tablet (6.25 mg total) by mouth 2 (two) times daily with a meal.   cephALEXin 500 MG capsule Commonly known as: KEFLEX Take 1 capsule (500 mg total) by mouth 3 (three) times daily. Started by: Felix Pacini, DO   fluconazole 150 MG tablet Commonly known as: DIFLUCAN Take 1 tablet (150 mg total) by mouth once  for 1 dose. Started by: Felix Pacini, DO   fluticasone 50 MCG/ACT nasal spray Commonly known as: FLONASE Place 2 sprays into both nostrils 2 (two) times daily as needed for allergies or rhinitis.   isosorbide mononitrate 60 MG 24 hr tablet Commonly known as: IMDUR TAKE 1 TABLET BY MOUTH EVERY DAY  lisinopril-hydrochlorothiazide 20-12.5 MG tablet Commonly known as: ZESTORETIC Take 1 tablet by mouth daily.   meloxicam 15 MG tablet Commonly known as: MOBIC Take 1 tablet (15 mg total) by mouth daily.   montelukast 10 MG tablet Commonly known as: SINGULAIR Take 1 tablet (10 mg total) by mouth at bedtime.   nitroGLYCERIN 0.4 MG SL tablet Commonly known as: NITROSTAT Place 1 tablet (0.4 mg total) under the tongue every 5 (five) minutes as needed for chest pain.   OMEPRAZOLE PO Take 1 tablet by mouth as needed.   PATADAY OP Place 1 drop into both eyes daily as needed (for itching or dryness).   rosuvastatin 10 MG tablet Commonly known as: Crestor Take 1 tablet (10 mg total) by mouth at bedtime.   sertraline 100 MG tablet Commonly known as: ZOLOFT Take 1 tablet (100 mg total) by mouth daily.   zolpidem 10 MG tablet Commonly known as: AMBIEN Take 1 tablet (10 mg total) by mouth at bedtime.       All past medical history, surgical history, allergies, family history, immunizations andmedications were updated in the EMR today and reviewed under the history and medication portions of their EMR.     ROS: Negative, with the exception of above mentioned in HPI   Objective:  BP 113/65   Pulse 67   Temp 98.4 F (36.9 C) (Oral)   Ht 5\' 4"  (1.626 m)   Wt 205 lb (93 kg)   SpO2 97%   BMI 35.19 kg/m  Body mass index is 35.19 kg/m. Gen: Afebrile. No acute distress. Nontoxic in appearance, well developed, well nourished.  HENT: AT. [place].  Eyes:Pupils Equal Round Reactive to light, Extraocular movements intact,  Conjunctiva without redness, discharge or icterus. CV: RRR  Abd:  Soft. Mild suprapubic pressure to palpitation. ND. BS present.  MSK: no cva tenderness.  Neuro: Normal gait. PERLA. EOMi. Alert. Oriented x3  Psych: Normal affect, dress and demeanor. Normal speech. Normal thought content and judgment.  No exam data present No results found. Results for orders placed or performed in visit on 10/19/19 (from the past 24 hour(s))  POCT Urinalysis Dipstick (Automated)     Status: Abnormal   Collection Time: 10/19/19  2:02 PM  Result Value Ref Range   Color, UA pale yellow    Clarity, UA clear    Glucose, UA Negative Negative   Bilirubin, UA negative    Ketones, UA negative    Spec Grav, UA <=1.005 (A) 1.010 - 1.025   Blood, UA negative    pH, UA 6.0 5.0 - 8.0   Protein, UA Negative Negative   Urobilinogen, UA 0.2 0.2 or 1.0 E.U./dL   Nitrite, UA negative    Leukocytes, UA Negative Negative    Assessment/Plan: Kristie Owens is a 64 y.o. female present for OV for  Urinary frequency/ Vaginal irritation - POCT urine is normal.  - diflucan x1 prescribed today - abx script provided today in the even sx worsen before culture results.  - POCT Urinalysis Dipstick (Automated) - Urinalysis w microscopic + reflex cultur - fluconazole (DIFLUCAN) 150 MG tablet; Take 1 tablet (150 mg total) by mouth once for 1 dose.  Dispense: 1 tablet; Refill: 0  Need for influenza vaccination - Flu Vaccine QUAD High Dose(Fluad)    Reviewed expectations re: course of current medical issues.  Discussed self-management of symptoms.  Outlined signs and symptoms indicating need for more acute intervention.  Patient verbalized understanding and all questions were answered.  Patient  received an Optometrist.    Orders Placed This Encounter  Procedures  . Flu Vaccine QUAD High Dose(Fluad)  . Urinalysis w microscopic + reflex cultur  . POCT Urinalysis Dipstick (Automated)   Meds ordered this encounter  Medications  . fluconazole (DIFLUCAN) 150 MG  tablet    Sig: Take 1 tablet (150 mg total) by mouth once for 1 dose.    Dispense:  1 tablet    Refill:  0  . cephALEXin (KEFLEX) 500 MG capsule    Sig: Take 1 capsule (500 mg total) by mouth 3 (three) times daily.    Dispense:  21 capsule    Refill:  0   Referral Orders  No referral(s) requested today     Note is dictated utilizing voice recognition software. Although note has been proof read prior to signing, occasional typographical errors still can be missed. If any questions arise, please do not hesitate to call for verification.   electronically signed by:  Felix Pacini, DO  [facility] [place]

## 2019-10-19 NOTE — Patient Instructions (Addendum)
Start diflucan - it is one tab.  We will send the urine for culture to be sure not infected.   Printed antibiotic script just in case you get increased symptoms before we get the culture.

## 2019-10-20 LAB — URINALYSIS W MICROSCOPIC + REFLEX CULTURE
Bacteria, UA: NONE SEEN /HPF
Bilirubin Urine: NEGATIVE
Glucose, UA: NEGATIVE
Hgb urine dipstick: NEGATIVE
Hyaline Cast: NONE SEEN /LPF
Ketones, ur: NEGATIVE
Leukocyte Esterase: NEGATIVE
Nitrites, Initial: NEGATIVE
Protein, ur: NEGATIVE
Specific Gravity, Urine: 1.004 (ref 1.001–1.03)
Squamous Epithelial / HPF: NONE SEEN /HPF (ref <=5)
WBC, UA: NONE SEEN /HPF (ref 0–5)
pH: 5.5 (ref 5.0–8.0)

## 2019-10-20 LAB — NO CULTURE INDICATED

## 2019-10-20 NOTE — Progress Notes (Signed)
Left VM for CB (lab results)

## 2019-11-17 ENCOUNTER — Encounter: Payer: Self-pay | Admitting: Cardiology

## 2019-11-17 ENCOUNTER — Ambulatory Visit (INDEPENDENT_AMBULATORY_CARE_PROVIDER_SITE_OTHER): Payer: Managed Care, Other (non HMO) | Admitting: Cardiology

## 2019-11-17 ENCOUNTER — Other Ambulatory Visit: Payer: Self-pay

## 2019-11-17 VITALS — BP 118/68 | HR 81 | Ht 64.0 in | Wt 205.0 lb

## 2019-11-17 DIAGNOSIS — I10 Essential (primary) hypertension: Secondary | ICD-10-CM | POA: Diagnosis not present

## 2019-11-17 DIAGNOSIS — I2510 Atherosclerotic heart disease of native coronary artery without angina pectoris: Secondary | ICD-10-CM

## 2019-11-17 DIAGNOSIS — E782 Mixed hyperlipidemia: Secondary | ICD-10-CM | POA: Diagnosis not present

## 2019-11-17 DIAGNOSIS — I208 Other forms of angina pectoris: Secondary | ICD-10-CM

## 2019-11-17 DIAGNOSIS — R072 Precordial pain: Secondary | ICD-10-CM | POA: Diagnosis not present

## 2019-11-17 DIAGNOSIS — I25110 Atherosclerotic heart disease of native coronary artery with unstable angina pectoris: Secondary | ICD-10-CM

## 2019-11-17 MED ORDER — NITROGLYCERIN 0.4 MG SL SUBL: 0.4000 mg | SUBLINGUAL_TABLET | SUBLINGUAL | 3 refills | Status: AC | PRN

## 2019-11-17 MED ORDER — ISOSORBIDE MONONITRATE ER 60 MG PO TB24: 90.0000 mg | ORAL_TABLET | Freq: Every day | ORAL | 11 refills | Status: DC

## 2019-11-17 NOTE — H&P (View-Only) (Signed)
Cardiology Office Note   Date:  11/17/2019   ID:  Kristie [place], DOB 28-Dec-1955, MRN [id number]  PCP:  Natalia Leatherwood, DO  Cardiologist:  Dr. Anne Fu     Chief Complaint  Patient presents with  . Coronary Artery Disease      History of Present Illness: Kristie Owens is a 64 y.o. female who presents for CAD but also increasing DOE   coronary artery disease status post heart catheterization in [place] of 2012 and May 2018 demonstrating widely patent LAD and RCA stent, mild irregularity of the ostium of the diagonal were PTCA was felt to be high risk, here for followup.   She called [place] on 07/22/15 complaining of chest pain/tightness, fatigue, shortness of breath with activity. She been doing a lot of yard work over the past 3 weeks and doesn't know if her symptoms were related to her heart no amount of rest seems to resolve symptoms. Pressure goes up into her throat about 95% of the time. No other radiation. She is worried about this. Her hands and ankles seem to be swelling as well over the past 2-3 weeks especially after hot yard work. She's been short of breath off and on as well.  Previous visit she had described nonexertional, fleeting chest discomfort different from her prior symptoms. We went over her last cardiac catheterization in [place] of 2012. LAD stent was patent. Diagonal ostial stenosis would be high risk.  Her stress test in 2016 was reassuring. No signs of ischemia. Normal function. However, a repeat stress test in 2017 did show potentially a mild area of anterior septal/apical ischemia. Still continue to be fatigue,  sleepiness, lack of energy. Decreasing carvedilol 6.25 mg twice a day.   I increased her isosorbide at previous visit. Maximal dosing.   Married in 11/14. Palpitations, lack of endurance at times.   [name] is sister, colon cancer. Works at [employer]. One of her coworkers had a small MI.  05/30/16-she continues to have increasing  anginal symptoms, decreasing endurance, not feeling well. She is concerned about progression of coronary artery disease. Nuclear stress test performed in 2017 demonstrated anteroseptal/apical possible ischemia. We were treating this to potential breast artifact and continued with aggressive medical therapy area she still continues to have symptoms despite this. She had the flu in January and did not recover as quickly as she wanted. Positive for chest pain, mild shortness of breath. Decreased energy.  04/29/17-reassuring cardiac catheterization.  No changes in May 2018.  She still continues to have the same symptoms.  We are trying decreased isosorbide to 60. Positive chest discomfort and windedness at times.  Sometimes has trouble getting up stairs.  No syncope, no bleeding.  10/13/2018-had nuclear stress test on 09/12/2018 which was low risk no ischemia.  This was after office visit describing chest pain in late July with [name]  Today she has been doing well, though beginning 6 months ago she began having DOE.  This has increased and she becomes SOB with less activity.  Not as bad with MI when she was so SOB she could not push cart across floor, though this reminds her of that time.  She needs NTG refilled she is on imdur.  She has tried to increase her endurance and has walked daily but after 3 weeks cannot do more and is worn out from same amount of walking.  She has chronic chest pain that has not proven to be cardiac.    Past Medical  History:  Diagnosis Date  . Achilles bursitis or tendinitis 11/11/2012   right   . Allergy   . Angina at rest [facility])    Dr Anne Fu  . CAD (coronary artery disease), native coronary artery    DES to LAD in 2 areas, one at the bifurcation of a diagonal branch and also in the mid to distal region. Her diagonal branch was also ballooned . She has a DES to RCA 10/10. Her circumflex artery mild ostial disease.  . Chicken pox   . Depression   . DM (diabetes  mellitus) (HCC)   . Hx of cardiovascular stress test    ETT- Myoview (2/16):  No ischemia, EF 69%, normal study  . Hyperlipidemia   . Hypertension   . Lumbar pain   . OSA on CPAP    Mild, mild hypoxia 83% AHI -11.1, O2 minimum 83%  . Peroneal tendinitis of left lower extremity 11/11/2012   Left with tear.   . Tonsillar calculus 08/12/2018    Past Surgical History:  Procedure Laterality Date  . BREAST EXCISIONAL BIOPSY Right 1995  . CAROTID STENT    . CESAREAN SECTION  1983  . FOOT SURGERY    . LEFT HEART CATH AND CORONARY ANGIOGRAPHY N/A 06/04/2016   Procedure: Left Heart Cath and Coronary Angiography;  Surgeon: Lyn Records, MD;  Location: [facility] INVASIVE CV LAB;  Service: Cardiovascular;  Laterality: N/A;  . LEFT HEART CATHETERIZATION WITH CORONARY ANGIOGRAM N/A 01/16/2011   Procedure: LEFT HEART CATHETERIZATION WITH CORONARY ANGIOGRAM;  Surgeon: Donato Schultz, MD;  Location: [facility] CATH LAB;  Service: Cardiovascular;  Laterality: N/A;  . NOSE SURGERY       Current Outpatient Medications  Medication Sig Dispense Refill  . amLODipine (NORVASC) 5 MG tablet Take 1 tablet (5 mg total) by mouth daily. 90 tablet 1  . aspirin EC 81 MG tablet Take 81 mg by mouth daily.      . carvedilol (COREG) 6.25 MG tablet Take 1 tablet (6.25 mg total) by mouth 2 (two) times daily with a meal. 180 tablet 1  . fluticasone (FLONASE) 50 MCG/ACT nasal spray Place 2 sprays into both nostrils 2 (two) times daily as needed for allergies or rhinitis. 16 g 11  . isosorbide mononitrate (IMDUR) 60 MG 24 hr tablet TAKE 1 TABLET BY MOUTH EVERY DAY 90 tablet 3  . lisinopril-hydrochlorothiazide (ZESTORETIC) 20-12.5 MG tablet Take 1 tablet by mouth daily. 90 tablet 1  . meloxicam (MOBIC) 15 MG tablet Take 1 tablet (15 mg total) by mouth daily. 90 tablet 1  . montelukast (SINGULAIR) 10 MG tablet Take 1 tablet (10 mg total) by mouth at bedtime. 90 tablet 1  . nitroGLYCERIN (NITROSTAT) 0.4 MG SL tablet Place 1 tablet (0.4 mg  total) under the tongue every 5 (five) minutes as needed for chest pain. 25 tablet 3  . Olopatadine HCl (PATADAY OP) Place 1 drop into both eyes daily as needed (for itching or dryness).    . OMEPRAZOLE PO Take 1 tablet by mouth as needed.    . rosuvastatin (CRESTOR) 10 MG tablet Take 1 tablet (10 mg total) by mouth at bedtime. 90 tablet 3  . sertraline (ZOLOFT) 100 MG tablet Take 1 tablet (100 mg total) by mouth daily. 90 tablet 1  . zolpidem (AMBIEN) 10 MG tablet Take 1 tablet (10 mg total) by mouth at bedtime. 90 tablet 1   No current facility-administered medications for this visit.    Allergies:   Sulfa antibiotics, Wellbutrin [  bupropion], and Prednisone    Social History:  The patient  reports that she has never smoked. She has never used smokeless tobacco. She reports previous alcohol use. She reports that she does not use drugs.   Family History:  The patient's family history includes Alcohol abuse in her brother; Arthritis in her sister; Asthma in her brother; COPD in her mother; Depression in her sister; Diabetes in her sister; Early death in her maternal grandfather and maternal grandmother; Early death (age of onset: 58) in her brother; Heart attack in her brother, paternal grandmother, and another family member; Heart disease in her brother, mother, paternal grandfather, and paternal grandmother; Heart failure in her paternal grandmother; Hyperlipidemia in her brother, brother, paternal grandfather, and paternal grandmother; Hypertension in her brother, brother, mother, paternal grandfather, paternal grandmother, and sister; Intellectual disability in her brother and brother; Kidney disease in her brother and brother; Lung cancer in her father and maternal grandfather; Miscarriages / Stillbirths in her sister; Suicidality in her maternal grandfather; Tuberculosis in her maternal grandmother; Ulcers in her mother.    ROS:  General:no colds or fevers, mild weight gain Skin:no rashes or  ulcers HEENT:no blurred vision, no congestion CV:see HPI PUL:see HPI GI:no diarrhea constipation or melena, no indigestion GU:no hematuria, no dysuria MS:no joint pain, no claudication Neuro:no syncope, no lightheadedness Endo:+ diabetes, no thyroid disease OSA uses her CPAP   Wt Readings from Last 3 Encounters:  11/17/19 205 lb (93 kg)  10/19/19 205 lb (93 kg)  07/22/19 199 lb (90.3 kg)     PHYSICAL EXAM: VS:  BP 118/68   Pulse 81   Ht 5' 4" (1.626 m)   Wt 205 lb (93 kg)   SpO2 97%   BMI 35.19 kg/m  , BMI Body mass index is 35.19 kg/m. General:Pleasant affect, NAD Skin:Warm and dry, brisk capillary refill HEENT:normocephalic, sclera clear, mucus membranes moist Neck:supple, no JVD, no bruits  Heart:S1S2 RRR without murmur, gallup, rub or click Lungs:clear without rales, rhonchi, or wheezes Abd:soft, non tender, + BS, do not palpate liver spleen or masses Ext:no lower ext edema, 2+ pedal pulses, 2+ radial pulses Neuro:alert and oriented X 3, MAE, follows commands, + facial symmetry    EKG:  EKG is ordered today. The ekg ordered today demonstrates SR at 81 no ST changes stable EKG   Recent Labs: 04/24/2019: ALT 20; BUN 18; Creatinine 0.7    Lipid Panel    Component Value Date/Time   CHOL 135 04/24/2019 0000   TRIG 169 (A) 04/24/2019 0000   HDL 33 (A) 04/24/2019 0000   LDLCALC 73 04/24/2019 0000       Other studies Reviewed: Additional studies/ records that were reviewed today include: . Stress test 08/2018 was neg for ischemia  Last cath 05/2016 with patent stents Diagnostic Dominance: Right     ASSESSMENT AND PLAN:  1.  crescendo angina described as DOE.  Will increase imdur to 90 mg daily, refill sl NTG for prn use and after discussion with Dr. Skains will plan for cardiac cath to re-eval stents and her known CAD.  Pt has 2 short trips she feels she needs to take and then return for cath.  Will also plan for echo with the DOE  The patient  understands that risks included but are not limited to stroke (1 in 1000), death (1 in 1000), kidney failure [usually temporary] (1 in 500), bleeding (1 in 200), allergic reaction [possibly serious] (1 in 200).   2.  Known CAD   with multiple stents. Proximal and distal LAD, mRCA and other non obstructive disease at that time  3.  HLD on statin    4.  HTN controlled no change in meds    Current medicines are reviewed with the patient today.  The patient Has no concerns regarding medicines.  The following changes have been made:  See above Labs/ tests ordered today include:see above  Disposition:   FU:  see above  Signed, [name], NP  11/17/2019 4:07 PM    [facility] [address] [place], [place], [place]  [address]/ [address] [place], [place] Phone: [phone]; Fax: [phone]  [phone]

## 2019-11-17 NOTE — Progress Notes (Signed)
Cardiology Office Note   Date:  11/17/2019   ID:  Kristie [place], DOB 28-Dec-1955, MRN [id number]  PCP:  Kristie Leatherwood, DO  Cardiologist:  Dr. Anne Fu     Chief Complaint  Patient presents with  . Coronary Artery Disease      History of Present Illness: Kristie [place] is a 64 y.o. female who presents for CAD but also increasing DOE   coronary artery disease status post heart catheterization in [place] of 2012 and May 2018 demonstrating widely patent LAD and RCA stent, mild irregularity of the ostium of the diagonal were PTCA was felt to be high risk, here for followup.   She called [place] on 07/22/15 complaining of chest pain/tightness, fatigue, shortness of breath with activity. She been doing a lot of yard work over the past 3 weeks and doesn't know if her symptoms were related to her heart no amount of rest seems to resolve symptoms. Pressure goes up into her throat about 95% of the time. No other radiation. She is worried about this. Her hands and ankles seem to be swelling as well over the past 2-3 weeks especially after hot yard work. She's been short of breath off and on as well.  Previous visit she had described nonexertional, fleeting chest discomfort different from her prior symptoms. We went over her last cardiac catheterization in [place] of 2012. LAD stent was patent. Diagonal ostial stenosis would be high risk.  Her stress test in 2016 was reassuring. No signs of ischemia. Normal function. However, a repeat stress test in 2017 did show potentially a mild area of anterior septal/apical ischemia. Still continue to be fatigue,  sleepiness, lack of energy. Decreasing carvedilol 6.25 mg twice a day.   I increased her isosorbide at previous visit. Maximal dosing.   Married in 11/14. Palpitations, lack of endurance at times.   [name] is sister, colon cancer. Works at [employer]. One of her coworkers had a small MI.  05/30/16-she continues to have increasing  anginal symptoms, decreasing endurance, not feeling well. She is concerned about progression of coronary artery disease. Nuclear stress test performed in 2017 demonstrated anteroseptal/apical possible ischemia. We were treating this to potential breast artifact and continued with aggressive medical therapy area she still continues to have symptoms despite this. She had the flu in January and did not recover as quickly as she wanted. Positive for chest pain, mild shortness of breath. Decreased energy.  04/29/17-reassuring cardiac catheterization.  No changes in May 2018.  She still continues to have the same symptoms.  We are trying decreased isosorbide to 60. Positive chest discomfort and windedness at times.  Sometimes has trouble getting up stairs.  No syncope, no bleeding.  10/13/2018-had nuclear stress test on 09/12/2018 which was low risk no ischemia.  This was after office visit describing chest pain in late July with [name]  Today she has been doing well, though beginning 6 months ago she began having DOE.  This has increased and she becomes SOB with less activity.  Not as bad with MI when she was so SOB she could not push cart across floor, though this reminds her of that time.  She needs NTG refilled she is on imdur.  She has tried to increase her endurance and has walked daily but after 3 weeks cannot do more and is worn out from same amount of walking.  She has chronic chest pain that has not proven to be cardiac.    Past Medical  History:  Diagnosis Date  . Achilles bursitis or tendinitis 11/11/2012   right   . Allergy   . Angina at rest [facility])    Dr Anne Fu  . CAD (coronary artery disease), native coronary artery    DES to LAD in 2 areas, one at the bifurcation of a diagonal branch and also in the mid to distal region. Her diagonal branch was also ballooned . She has a DES to RCA 10/10. Her circumflex artery mild ostial disease.  . Chicken pox   . Depression   . DM (diabetes  mellitus) (HCC)   . Hx of cardiovascular stress test    ETT- Myoview (2/16):  No ischemia, EF 69%, normal study  . Hyperlipidemia   . Hypertension   . Lumbar pain   . OSA on CPAP    Mild, mild hypoxia 83% AHI -11.1, O2 minimum 83%  . Peroneal tendinitis of left lower extremity 11/11/2012   Left with tear.   . Tonsillar calculus 08/12/2018    Past Surgical History:  Procedure Laterality Date  . BREAST EXCISIONAL BIOPSY Right 1995  . CAROTID STENT    . CESAREAN SECTION  1983  . FOOT SURGERY    . LEFT HEART CATH AND CORONARY ANGIOGRAPHY N/A 06/04/2016   Procedure: Left Heart Cath and Coronary Angiography;  Surgeon: Lyn Records, MD;  Location: [facility] INVASIVE CV LAB;  Service: Cardiovascular;  Laterality: N/A;  . LEFT HEART CATHETERIZATION WITH CORONARY ANGIOGRAM N/A 01/16/2011   Procedure: LEFT HEART CATHETERIZATION WITH CORONARY ANGIOGRAM;  Surgeon: Donato Schultz, MD;  Location: [facility] CATH LAB;  Service: Cardiovascular;  Laterality: N/A;  . NOSE SURGERY       Current Outpatient Medications  Medication Sig Dispense Refill  . amLODipine (NORVASC) 5 MG tablet Take 1 tablet (5 mg total) by mouth daily. 90 tablet 1  . aspirin EC 81 MG tablet Take 81 mg by mouth daily.      . carvedilol (COREG) 6.25 MG tablet Take 1 tablet (6.25 mg total) by mouth 2 (two) times daily with a meal. 180 tablet 1  . fluticasone (FLONASE) 50 MCG/ACT nasal spray Place 2 sprays into both nostrils 2 (two) times daily as needed for allergies or rhinitis. 16 g 11  . isosorbide mononitrate (IMDUR) 60 MG 24 hr tablet TAKE 1 TABLET BY MOUTH EVERY DAY 90 tablet 3  . lisinopril-hydrochlorothiazide (ZESTORETIC) 20-12.5 MG tablet Take 1 tablet by mouth daily. 90 tablet 1  . meloxicam (MOBIC) 15 MG tablet Take 1 tablet (15 mg total) by mouth daily. 90 tablet 1  . montelukast (SINGULAIR) 10 MG tablet Take 1 tablet (10 mg total) by mouth at bedtime. 90 tablet 1  . nitroGLYCERIN (NITROSTAT) 0.4 MG SL tablet Place 1 tablet (0.4 mg  total) under the tongue every 5 (five) minutes as needed for chest pain. 25 tablet 3  . Olopatadine HCl (PATADAY OP) Place 1 drop into both eyes daily as needed (for itching or dryness).    . OMEPRAZOLE PO Take 1 tablet by mouth as needed.    . rosuvastatin (CRESTOR) 10 MG tablet Take 1 tablet (10 mg total) by mouth at bedtime. 90 tablet 3  . sertraline (ZOLOFT) 100 MG tablet Take 1 tablet (100 mg total) by mouth daily. 90 tablet 1  . zolpidem (AMBIEN) 10 MG tablet Take 1 tablet (10 mg total) by mouth at bedtime. 90 tablet 1   No current facility-administered medications for this visit.    Allergies:   Sulfa antibiotics, Wellbutrin [  bupropion], and Prednisone    Social History:  The patient  reports that she has never smoked. She has never used smokeless tobacco. She reports previous alcohol use. She reports that she does not use drugs.   Family History:  The patient's family history includes Alcohol abuse in her brother; Arthritis in her sister; Asthma in her brother; COPD in her mother; Depression in her sister; Diabetes in her sister; Early death in her maternal grandfather and maternal grandmother; Early death (age of onset: 5058) in her brother; Heart attack in her brother, paternal grandmother, and another family member; Heart disease in her brother, mother, paternal grandfather, and paternal grandmother; Heart failure in her paternal grandmother; Hyperlipidemia in her brother, brother, paternal grandfather, and paternal grandmother; Hypertension in her brother, brother, mother, paternal grandfather, paternal grandmother, and sister; Intellectual disability in her brother and brother; Kidney disease in her brother and brother; Lung cancer in her father and maternal grandfather; Miscarriages / [place] in her sister; Suicidality in her maternal grandfather; Tuberculosis in her maternal grandmother; Ulcers in her mother.    ROS:  General:no colds or fevers, mild weight gain Skin:no rashes or  ulcers HEENT:no blurred vision, no congestion CV:see HPI PUL:see HPI GI:no diarrhea constipation or melena, no indigestion GU:no hematuria, no dysuria MS:no joint pain, no claudication Neuro:no syncope, no lightheadedness Endo:+ diabetes, no thyroid disease OSA uses her CPAP   Wt Readings from Last 3 Encounters:  11/17/19 205 lb (93 kg)  10/19/19 205 lb (93 kg)  07/22/19 199 lb (90.3 kg)     PHYSICAL EXAM: VS:  BP 118/68   Pulse 81   Ht 5\' 4"  (1.626 m)   Wt 205 lb (93 kg)   SpO2 97%   BMI 35.19 kg/m  , BMI Body mass index is 35.19 kg/m. General:Pleasant affect, NAD Skin:Warm and dry, brisk capillary refill HEENT:normocephalic, sclera clear, mucus membranes moist Neck:supple, no JVD, no bruits  Heart:S1S2 RRR without murmur, gallup, rub or click Lungs:clear without rales, rhonchi, or wheezes ZOX:WRUEAbd:soft, non tender, + BS, do not palpate liver spleen or masses Ext:no lower ext edema, 2+ pedal pulses, 2+ radial pulses Neuro:alert and oriented X 3, MAE, follows commands, + facial symmetry    EKG:  EKG is ordered today. The ekg ordered today demonstrates SR at 81 no ST changes stable EKG   Recent Labs: 04/24/2019: ALT 20; BUN 18; Creatinine 0.7    Lipid Panel    Component Value Date/Time   CHOL 135 04/24/2019 0000   TRIG 169 (A) 04/24/2019 0000   HDL 33 (A) 04/24/2019 0000   LDLCALC 73 04/24/2019 0000       Other studies Reviewed: Additional studies/ records that were reviewed today include: . Stress test 08/2018 was neg for ischemia  Last cath 05/2016 with patent stents Diagnostic Dominance: Right     ASSESSMENT AND PLAN:  1.  crescendo angina described as DOE.  Will increase imdur to 90 mg daily, refill sl NTG for prn use and after discussion with Dr. Anne FuSkains will plan for cardiac cath to re-eval stents and her known CAD.  Pt has 2 short trips she feels she needs to take and then return for cath.  Will also plan for echo with the DOE  The patient  understands that risks included but are not limited to stroke (1 in 1000), death (1 in 1000), kidney failure [usually temporary] (1 in 500), bleeding (1 in 200), allergic reaction [possibly serious] (1 in 200).   2.  Known CAD  with multiple stents. Proximal and distal LAD, mRCA and other non obstructive disease at that time  3.  HLD on statin    4.  HTN controlled no change in meds    Current medicines are reviewed with the patient today.  The patient Has no concerns regarding medicines.  The following changes have been made:  See above Labs/ tests ordered today include:see above  Disposition:   FU:  see above  Signed, [name], NP  11/17/2019 4:07 PM    [facility] [address] [place], [place], [place]  [address]/ [address] [place], [place] Phone: [phone]; Fax: [phone]  [phone]

## 2019-11-17 NOTE — Patient Instructions (Addendum)
Medication Instructions:  Your physician has recommended you make the following change in your medication:  1.  INCREASE the Imdur to 60 mg taking 1 1/2 tablet daily  *If you need a refill on your cardiac medications before your next appointment, please call your pharmacy*   Lab Work: None today  If you have labs (blood work) drawn today and your tests are completely normal, you will receive your results only by: [name] MyChart Message (if you have MyChart) OR . A paper copy in the mail If you have any lab test that is abnormal or we need to change your treatment, we will call you to review the results.   Testing/Procedures:  Your physician has requested that you have an echocardiogram. Echocardiography is a painless test that uses sound waves to create images of your heart. It provides your doctor with information about the size and shape of your heart and how well your heart's chambers and valves are working. This procedure takes approximately one hour. There are no restrictions for this procedure.    Your physician has requested that you have a cardiac catheterization. (I will call you tomorrow with details) Cardiac catheterization is used to diagnose and/or treat various heart conditions. Doctors may recommend this procedure for a number of different reasons. The most common reason is to evaluate chest pain. Chest pain can be a symptom of coronary artery disease (CAD), and cardiac catheterization can show whether plaque is narrowing or blocking your heart's arteries. This procedure is also used to evaluate the valves, as well as measure the blood flow and oxygen levels in different parts of your heart. For further information please visit [email or web address] Please follow instruction sheet, BELOW:     [facility] CARDIOVASCULAR DIVISION [facility] ST OFFICE [address] [place], [address] [place] [address] Dept: [phone] Loc:  [phone]  Kristie Owens  11/17/2019  You are scheduled for a Cardiac Catheterization on  12/04/2019  1. Please arrive at the [facility] (Main Entrance A) at [facility] [address] [place], [place] [address] at 5:30 a.m Free valet parking service is available.   Special note: Every effort is made to have your procedure done on time. Please understand that emergencies sometimes delay scheduled procedures.  2. Diet: Do not eat solid foods after midnight.  The patient may have clear liquids until 5am upon the day of the procedure.  3. Labs: You will need to have blood drawn on             You do not need to be fasting.  4. Medication instructions in preparation for your procedure:      Due to recent COVID-19 restrictions implemented by our local and state authorities and in an effort to keep both patients and staff as safe as possible, our hospital system requires COVID-19 testing prior to certain scheduled hospital procedures.  Please go to [address] [facility] [place], [place] [address] on 12/02/19 at 8:00 a.m..  This is a drive up testing site.  You will not need to exit your vehicle.  You will not be billed at the time of testing but may receive a bill later depending on your insurance. You must agree to self-quarantine from the time of your testing until the procedure date on11/5/21.  This should included staying home with ONLY the people you live with.  Avoid take-out, grocery store shopping or leaving the house for any non-emergent reason.  Failure to have  your COVID-19 test done on the date and time you have been scheduled will result in cancellation of your procedure.  Please call our office at [phone] if you have any questions.   Contrast Allergy: No    DO NOT TAKE THE LISINOPRIL-HCTZ THE MORNING OF YOUR PROCEDURE  On the morning of your procedure, take your Aspirin and any morning medicines NOT listed above.  You may use sips of water.  5. Plan for one  night stay--bring personal belongings. 6. Bring a current list of your medications and current insurance cards. 7. You MUST have a responsible person to drive you home. 8. Someone MUST be with you the first 24 hours after you arrive home or your discharge will be delayed. 9. Please wear clothes that are easy to get on and off and wear slip-on shoes.  Thank you for allowing [place] to care for you!   -- [name]     Follow-Up: At [facility], you and your health needs are our priority.  As part of our continuing mission to provide you with exceptional heart care, we have created designated Provider Care Teams.  These Care Teams include your primary Cardiologist (physician) and Advanced Practice Providers (APPs -  Physician Assistants and Nurse Practitioners) who all work together to provide you with the care you need, when you need it.  We recommend signing up for the patient portal called "MyChart".  Sign up information is provided on this After Visit Summary.  MyChart is used to connect with patients for Virtual Visits (Telemedicine).  Patients are able to view lab/test results, encounter notes, upcoming appointments, etc.  Non-urgent messages can be sent to your provider as well.   To learn more about what you can do with MyChart, go to [email or web address].    Your next appointment:   2 week(s) 12/18/2019 ARRIVE AT 8:45   The format for your next appointment:   In Person  Provider:   You may see Dr. Anne Fu or one of the following Advanced Practice Providers on your designated Care Team:    Norma Fredrickson, NP  [name], NP  Georgie Chard, NP    Other Instructions

## 2019-11-18 ENCOUNTER — Encounter: Payer: Self-pay | Admitting: *Deleted

## 2019-11-20 ENCOUNTER — Ambulatory Visit (HOSPITAL_COMMUNITY): Admit: 2019-11-20 | Payer: Managed Care, Other (non HMO) | Admitting: Cardiology

## 2019-11-20 ENCOUNTER — Encounter (HOSPITAL_COMMUNITY): Payer: Self-pay

## 2019-11-20 SURGERY — LEFT HEART CATH AND CORONARY ANGIOGRAPHY
Anesthesia: LOCAL

## 2019-11-23 ENCOUNTER — Other Ambulatory Visit: Payer: Self-pay

## 2019-11-23 ENCOUNTER — Other Ambulatory Visit: Payer: Managed Care, Other (non HMO)

## 2019-11-23 DIAGNOSIS — E782 Mixed hyperlipidemia: Secondary | ICD-10-CM

## 2019-11-23 DIAGNOSIS — I10 Essential (primary) hypertension: Secondary | ICD-10-CM

## 2019-11-23 DIAGNOSIS — R072 Precordial pain: Secondary | ICD-10-CM

## 2019-11-23 DIAGNOSIS — I25110 Atherosclerotic heart disease of native coronary artery with unstable angina pectoris: Secondary | ICD-10-CM

## 2019-11-24 LAB — BASIC METABOLIC PANEL
BUN/Creatinine Ratio: 23 (ref 12–28)
BUN: 17 mg/dL (ref 8–27)
CO2: 25 mmol/L (ref 20–29)
Calcium: 9.8 mg/dL (ref 8.7–10.3)
Chloride: 99 mmol/L (ref 96–106)
Creatinine, Ser: 0.73 mg/dL (ref 0.57–1.00)
GFR calc Af Amer: 99 mL/min/{1.73_m2} (ref >59)
GFR calc non Af Amer: 86 mL/min/{1.73_m2} (ref >59)
Glucose: 84 mg/dL (ref 65–99)
Potassium: 4.3 mmol/L (ref 3.5–5.2)
Sodium: 136 mmol/L (ref 134–144)

## 2019-11-24 LAB — CBC
Hematocrit: 37.1 % (ref 34.0–46.6)
Hemoglobin: 12.2 g/dL (ref 11.1–15.9)
MCH: 26.9 pg (ref 26.6–33.0)
MCHC: 32.9 g/dL (ref 31.5–35.7)
MCV: 82 fL (ref 79–97)
Platelets: 390 10*3/uL (ref 150–450)
RBC: 4.53 x10E6/uL (ref 3.77–5.28)
RDW: 14.6 % (ref 11.7–15.4)
WBC: 4.4 10*3/uL (ref 3.4–10.8)

## 2019-12-02 ENCOUNTER — Other Ambulatory Visit (HOSPITAL_COMMUNITY)
Admission: RE | Admit: 2019-12-02 | Discharge: 2019-12-02 | Disposition: A | Discharge status: Home / Self Care | Payer: Managed Care, Other (non HMO) | Source: Ambulatory Visit | Attending: Cardiovascular Disease | Admitting: Cardiovascular Disease

## 2019-12-02 DIAGNOSIS — Z20822 Contact with and (suspected) exposure to covid-19: Secondary | ICD-10-CM | POA: Diagnosis not present

## 2019-12-02 DIAGNOSIS — Z01812 Encounter for preprocedural laboratory examination: Secondary | ICD-10-CM | POA: Insufficient documentation

## 2019-12-02 LAB — SARS CORONAVIRUS 2 (TAT 6-24 HRS): SARS Coronavirus 2: NEGATIVE

## 2019-12-03 ENCOUNTER — Other Ambulatory Visit (HOSPITAL_COMMUNITY): Payer: Managed Care, Other (non HMO)

## 2019-12-03 ENCOUNTER — Telehealth: Payer: Self-pay | Admitting: *Deleted

## 2019-12-03 NOTE — Telephone Encounter (Signed)
Pt contacted pre-catheterization scheduled at [facility] for: Friday December 04, 2019 7:30 AM Verified arrival time and place: [facility] Main Entrance A [facility]) at: 5:30 AM   No solid food after midnight prior to cath, clear liquids until 5 AM day of procedure.  Hold: Lisinopril-HCTZ-AM of procedure  AM meds can be  taken pre-cath with sips of water including: ASA 81 mg   Confirmed patient has responsible adult to drive home post procedure and be with patient first 24 hours after arriving home: yes  You are allowed ONE visitor in the waiting room during the time you are at the hospital for your procedure. Both you and your visitor must wear a mask once you enter the hospital.       COVID-19 Pre-Screening Questions:  . In the past 14 days have you had a new cough, new headache, new nasal congestion, fever (100.4 or greater) unexplained body aches, new sore throat, or sudden loss of taste or sense of smell? no . In the past 14 days have you been around anyone with known Covid 19? no            Reviewed procedure/mask/visitor instructions, COVID-19 questions reviewed with patient.

## 2019-12-04 ENCOUNTER — Encounter (HOSPITAL_COMMUNITY)
Admission: RE | Disposition: A | Discharge status: Home / Self Care | Payer: Self-pay | Source: Home / Self Care | Attending: Cardiovascular Disease

## 2019-12-04 ENCOUNTER — Other Ambulatory Visit: Payer: Self-pay

## 2019-12-04 ENCOUNTER — Ambulatory Visit (HOSPITAL_COMMUNITY)
Admission: RE | Admit: 2019-12-04 | Discharge: 2019-12-04 | Disposition: A | Discharge status: Home / Self Care | Payer: Managed Care, Other (non HMO) | Attending: Cardiovascular Disease | Admitting: Cardiovascular Disease

## 2019-12-04 DIAGNOSIS — I10 Essential (primary) hypertension: Secondary | ICD-10-CM | POA: Insufficient documentation

## 2019-12-04 DIAGNOSIS — Z955 Presence of coronary angioplasty implant and graft: Secondary | ICD-10-CM | POA: Insufficient documentation

## 2019-12-04 DIAGNOSIS — R0609 Other forms of dyspnea: Secondary | ICD-10-CM | POA: Insufficient documentation

## 2019-12-04 DIAGNOSIS — Z888 Allergy status to other drugs, medicaments and biological substances status: Secondary | ICD-10-CM | POA: Insufficient documentation

## 2019-12-04 DIAGNOSIS — I25110 Atherosclerotic heart disease of native coronary artery with unstable angina pectoris: Secondary | ICD-10-CM | POA: Diagnosis not present

## 2019-12-04 DIAGNOSIS — Z882 Allergy status to sulfonamides status: Secondary | ICD-10-CM | POA: Insufficient documentation

## 2019-12-04 DIAGNOSIS — E785 Hyperlipidemia, unspecified: Secondary | ICD-10-CM | POA: Diagnosis not present

## 2019-12-04 DIAGNOSIS — R0602 Shortness of breath: Secondary | ICD-10-CM

## 2019-12-04 DIAGNOSIS — I2510 Atherosclerotic heart disease of native coronary artery without angina pectoris: Secondary | ICD-10-CM | POA: Diagnosis not present

## 2019-12-04 HISTORY — PX: LEFT HEART CATH AND CORONARY ANGIOGRAPHY: CATH118249

## 2019-12-04 SURGERY — LEFT HEART CATH AND CORONARY ANGIOGRAPHY
Anesthesia: LOCAL

## 2019-12-04 MED ORDER — SODIUM CHLORIDE 0.9% FLUSH: 3.0000 mL | INTRAVENOUS | Status: DC | PRN

## 2019-12-04 MED ORDER — SODIUM CHLORIDE 0.9 % WEIGHT BASED INFUSION
3.0000 mL/kg/h | INTRAVENOUS | Status: AC
  Administered 2019-12-04: 3 mL/kg/h via INTRAVENOUS

## 2019-12-04 MED ORDER — HEPARIN (PORCINE) IN NACL 1000-0.9 UT/500ML-% IV SOLN
INTRAVENOUS | Status: AC
  Filled 2019-12-04: qty 1000

## 2019-12-04 MED ORDER — HEPARIN SODIUM (PORCINE) 1000 UNIT/ML IJ SOLN
INTRAMUSCULAR | Status: DC | PRN
  Administered 2019-12-04: 5000 [IU] via INTRAVENOUS

## 2019-12-04 MED ORDER — SODIUM CHLORIDE 0.9 % IV SOLN: INTRAVENOUS | Status: AC

## 2019-12-04 MED ORDER — IOHEXOL 350 MG/ML SOLN
INTRAVENOUS | Status: DC | PRN
  Administered 2019-12-04: 75 mL

## 2019-12-04 MED ORDER — LIDOCAINE HCL (PF) 1 % IJ SOLN
INTRAMUSCULAR | Status: DC | PRN
  Administered 2019-12-04: 2 mL

## 2019-12-04 MED ORDER — VERAPAMIL HCL 2.5 MG/ML IV SOLN
INTRAVENOUS | Status: DC | PRN
  Administered 2019-12-04: 10 mL via INTRA_ARTERIAL

## 2019-12-04 MED ORDER — SODIUM CHLORIDE 0.9 % WEIGHT BASED INFUSION: 1.0000 mL/kg/h | INTRAVENOUS | Status: DC

## 2019-12-04 MED ORDER — HYDRALAZINE HCL 20 MG/ML IJ SOLN: 10.0000 mg | INTRAMUSCULAR | Status: DC | PRN

## 2019-12-04 MED ORDER — SODIUM CHLORIDE 0.9% FLUSH: 3.0000 mL | Freq: Two times a day (BID) | INTRAVENOUS | Status: DC

## 2019-12-04 MED ORDER — SODIUM CHLORIDE 0.9 % IV SOLN: 250.0000 mL | INTRAVENOUS | Status: DC | PRN

## 2019-12-04 MED ORDER — ASPIRIN 81 MG PO CHEW: 81.0000 mg | CHEWABLE_TABLET | ORAL | Status: DC

## 2019-12-04 MED ORDER — FENTANYL CITRATE (PF) 100 MCG/2ML IJ SOLN
INTRAMUSCULAR | Status: DC | PRN
Start: 2019-12-04 — End: 2019-12-04
  Administered 2019-12-04: 50 ug via INTRAVENOUS

## 2019-12-04 MED ORDER — HEPARIN SODIUM (PORCINE) 1000 UNIT/ML IJ SOLN
INTRAMUSCULAR | Status: AC
  Filled 2019-12-04: qty 1

## 2019-12-04 MED ORDER — FENTANYL CITRATE (PF) 100 MCG/2ML IJ SOLN
INTRAMUSCULAR | Status: AC
  Filled 2019-12-04: qty 2

## 2019-12-04 MED ORDER — ACETAMINOPHEN 325 MG PO TABS: 650.0000 mg | ORAL_TABLET | ORAL | Status: DC | PRN

## 2019-12-04 MED ORDER — MIDAZOLAM HCL 2 MG/2ML IJ SOLN
INTRAMUSCULAR | Status: DC | PRN
  Administered 2019-12-04: 1 mg via INTRAVENOUS

## 2019-12-04 MED ORDER — HEPARIN (PORCINE) IN NACL 1000-0.9 UT/500ML-% IV SOLN
INTRAVENOUS | Status: DC | PRN
  Administered 2019-12-04 (×2): 500 mL

## 2019-12-04 MED ORDER — LABETALOL HCL 5 MG/ML IV SOLN: 10.0000 mg | INTRAVENOUS | Status: DC | PRN

## 2019-12-04 MED ORDER — MIDAZOLAM HCL 2 MG/2ML IJ SOLN
INTRAMUSCULAR | Status: AC
  Filled 2019-12-04: qty 2

## 2019-12-04 MED ORDER — VERAPAMIL HCL 2.5 MG/ML IV SOLN
INTRAVENOUS | Status: AC
  Filled 2019-12-04: qty 2

## 2019-12-04 MED ORDER — ONDANSETRON HCL 4 MG/2ML IJ SOLN: 4.0000 mg | Freq: Four times a day (QID) | INTRAMUSCULAR | Status: DC | PRN

## 2019-12-04 MED ORDER — LIDOCAINE HCL (PF) 1 % IJ SOLN
INTRAMUSCULAR | Status: AC
  Filled 2019-12-04: qty 30

## 2019-12-04 SURGICAL SUPPLY — 10 items
CATH 5FR JL3.5 JR4 ANG PIG MP (CATHETERS) ×1 IMPLANT
DEVICE RAD COMP TR BAND LRG (VASCULAR PRODUCTS) ×1 IMPLANT
GLIDESHEATH SLEND SS 6F .021 (SHEATH) ×1 IMPLANT
GUIDEWIRE INQWIRE 1.5J.035X260 (WIRE) IMPLANT
INQWIRE 1.5J .035X260CM (WIRE) ×2
KIT HEART LEFT (KITS) ×2 IMPLANT
PACK CARDIAC CATHETERIZATION (CUSTOM PROCEDURE TRAY) ×2 IMPLANT
SYR MEDRAD MARK 7 150ML (SYRINGE) ×2 IMPLANT
TRANSDUCER W/STOPCOCK (MISCELLANEOUS) ×2 IMPLANT
TUBING CIL FLEX 10 FLL-RA (TUBING) ×2 IMPLANT

## 2019-12-04 NOTE — Discharge Instructions (Signed)
Radial Site Care  This sheet gives you information about how to care for yourself after your procedure. Your health care provider may also give you more specific instructions. If you have problems or questions, contact your health care provider. What can I expect after the procedure? After the procedure, it is common to have:  Bruising and tenderness at the catheter insertion area. Follow these instructions at home: Medicines  Take over-the-counter and prescription medicines only as told by your health care provider. Insertion site care  Follow instructions from your health care provider about how to take care of your insertion site. Make sure you: ? Wash your hands with soap and water before you change your bandage (dressing). If soap and water are not available, use hand sanitizer. ? Change your dressing as told by your health care provider. ? Leave stitches (sutures), skin glue, or adhesive strips in place. These skin closures may need to stay in place for 2 weeks or longer. If adhesive strip edges start to loosen and curl up, you may trim the loose edges. Do not remove adhesive strips completely unless your health care provider tells you to do that.  Check your insertion site every day for signs of infection. Check for: ? Redness, swelling, or pain. ? Fluid or blood. ? Pus or a bad smell. ? Warmth.  Do not take baths, swim, or use a hot tub until your health care provider approves.  You may shower 24-48 hours after the procedure, or as directed by your health care provider. ? Remove the dressing and gently wash the site with plain soap and water. ? Pat the area dry with a clean towel. ? Do not rub the site. That could cause bleeding.  Do not apply powder or lotion to the site. Activity   For 24 hours after the procedure, or as directed by your health care provider: ? Do not flex or bend the affected arm. ? Do not push or pull heavy objects with the affected arm. ? Do not  drive yourself home from the hospital or clinic. You may drive 24 hours after the procedure unless your health care provider tells you not to. ? Do not operate machinery or power tools.  Do not lift anything that is heavier than 10 lb (4.5 kg), or the limit that you are told, until your health care provider says that it is safe.  Ask your health care provider when it is okay to: ? Return to work or school. ? Resume usual physical activities or sports. ? Resume sexual activity. General instructions  If the catheter site starts to bleed, raise your arm and put firm pressure on the site. If the bleeding does not stop, get help right away. This is a medical emergency.  If you went home on the same day as your procedure, a responsible adult should be with you for the first 24 hours after you arrive home.  Keep all follow-up visits as told by your health care provider. This is important. Contact a health care provider if:  You have a fever.  You have redness, swelling, or yellow drainage around your insertion site. Get help right away if:  You have unusual pain at the radial site.  The catheter insertion area swells very fast.  The insertion area is bleeding, and the bleeding does not stop when you hold steady pressure on the area.  Your arm or hand becomes pale, cool, tingly, or numb. These symptoms may represent a serious problem   that is an emergency. Do not wait to see if the symptoms will go away. Get medical help right away. Call your local emergency services (911 in the [place] Do not drive yourself to the hospital. Summary  After the procedure, it is common to have bruising and tenderness at the site.  Follow instructions from your health care provider about how to take care of your radial site wound. Check the wound every day for signs of infection.  Do not lift anything that is heavier than 10 lb (4.5 kg), or the limit that you are told, until your health care provider says  that it is safe. This information is not intended to replace advice given to you by your health care provider. Make sure you discuss any questions you have with your health care provider. Document Revised: 02/20/2017 Document Reviewed: 02/20/2017 Elsevier Patient Education  2020 [employer] Inc.  

## 2019-12-04 NOTE — Interval H&P Note (Signed)
History and Physical Interval Note:  12/04/2019 7:18 AM  Kristie Owens  has presented today for surgery, with the diagnosis of CAD.  The various methods of treatment have been discussed with the patient and family. After consideration of risks, benefits and other options for treatment, the patient has consented to  Procedure(s): LEFT HEART CATH AND CORONARY ANGIOGRAPHY (N/A) as a surgical intervention.  The patient's history has been reviewed, patient examined, no change in status, stable for surgery.  I have reviewed the patient's chart and labs.  Questions were answered to the patient's satisfaction.    Cath Lab Visit (complete for each Cath Lab visit)  Clinical Evaluation Leading to the Procedure:   ACS: No.  Non-ACS:    Anginal Classification: CCS III  Anti-ischemic medical therapy: Maximal Therapy (2 or more classes of medications)  Non-Invasive Test Results: No non-invasive testing performed  Prior CABG: No previous CABG        Verne Carrow

## 2019-12-04 NOTE — Progress Notes (Signed)
Discharge instructions reviewed with patient and family. Verbalized understanding. 

## 2019-12-07 ENCOUNTER — Encounter (HOSPITAL_COMMUNITY): Payer: Self-pay | Admitting: Cardiovascular Disease

## 2019-12-07 ENCOUNTER — Other Ambulatory Visit: Payer: Self-pay

## 2019-12-07 ENCOUNTER — Ambulatory Visit (HOSPITAL_COMMUNITY): Payer: Managed Care, Other (non HMO) | Attending: Cardiology

## 2019-12-07 DIAGNOSIS — I10 Essential (primary) hypertension: Secondary | ICD-10-CM

## 2019-12-07 DIAGNOSIS — E782 Mixed hyperlipidemia: Secondary | ICD-10-CM | POA: Diagnosis not present

## 2019-12-07 DIAGNOSIS — I25110 Atherosclerotic heart disease of native coronary artery with unstable angina pectoris: Secondary | ICD-10-CM | POA: Diagnosis not present

## 2019-12-07 LAB — ECHOCARDIOGRAM COMPLETE
Area-P 1/2: 3.4 cm2
S' Lateral: 2.4 cm

## 2019-12-07 MED ORDER — PERFLUTREN LIPID MICROSPHERE
1.0000 mL | INTRAVENOUS | Status: AC | PRN
  Administered 2019-12-07: 3 mL via INTRAVENOUS

## 2019-12-08 ENCOUNTER — Telehealth: Payer: Self-pay | Admitting: Cardiology

## 2019-12-08 NOTE — Telephone Encounter (Signed)
-----   Message from Leone Brand, NP sent at 12/08/2019  9:42 AM EST ----- Echo has good pump action, the heart muscle is a little this, BP control is important The valves are good.  Keep appt with Dr. Anne Fu for follow up.  All reassuring -

## 2019-12-08 NOTE — Telephone Encounter (Signed)
Pt called in returning jennifer W. Call about her echo.    Best number for pt is [phone]

## 2019-12-08 NOTE — Telephone Encounter (Signed)
Returned call to pt and she has been made aware of her echocardiogram results and recommendations and she verbalized understanding.

## 2019-12-15 LAB — HM DIABETES EYE EXAM

## 2019-12-18 ENCOUNTER — Encounter: Payer: Self-pay | Admitting: Cardiology

## 2019-12-18 ENCOUNTER — Ambulatory Visit (INDEPENDENT_AMBULATORY_CARE_PROVIDER_SITE_OTHER): Payer: Managed Care, Other (non HMO) | Admitting: Cardiology

## 2019-12-18 ENCOUNTER — Other Ambulatory Visit: Payer: Self-pay

## 2019-12-18 VITALS — BP 112/60 | HR 68 | Ht 64.0 in | Wt 206.0 lb

## 2019-12-18 DIAGNOSIS — Z79899 Other long term (current) drug therapy: Secondary | ICD-10-CM

## 2019-12-18 DIAGNOSIS — I10 Essential (primary) hypertension: Secondary | ICD-10-CM

## 2019-12-18 DIAGNOSIS — E782 Mixed hyperlipidemia: Secondary | ICD-10-CM

## 2019-12-18 DIAGNOSIS — I25110 Atherosclerotic heart disease of native coronary artery with unstable angina pectoris: Secondary | ICD-10-CM

## 2019-12-18 MED ORDER — ROSUVASTATIN CALCIUM 20 MG PO TABS: 20.0000 mg | ORAL_TABLET | Freq: Every day | ORAL | 3 refills | Status: DC

## 2019-12-18 NOTE — Patient Instructions (Signed)
Medication Instructions:  Please increase Crestor to 20 mg a day. Continue all other medications as listed.  *If you need a refill on your cardiac medications before your next appointment, please call your pharmacy*  Lab Work: Please have blood work in 3 months (Lipid/ALT) If you have labs (blood work) drawn today and your tests are completely normal, you will receive your results only by: [name] MyChart Message (if you have MyChart) OR . A paper copy in the mail If you have any lab test that is abnormal or we need to change your treatment, we will call you to review the results.  Follow-Up: At [facility], you and your health needs are our priority.  As part of our continuing mission to provide you with exceptional heart care, we have created designated Provider Care Teams.  These Care Teams include your primary Cardiologist (physician) and Advanced Practice Providers (APPs -  Physician Assistants and Nurse Practitioners) who all work together to provide you with the care you need, when you need it.  We recommend signing up for the patient portal called "MyChart".  Sign up information is provided on this After Visit Summary.  MyChart is used to connect with patients for Virtual Visits (Telemedicine).  Patients are able to view lab/test results, encounter notes, upcoming appointments, etc.  Non-urgent messages can be sent to your provider as well.   To learn more about what you can do with MyChart, go to [email or web address].    Your next appointment:   6 month(s)  The format for your next appointment:   In Person  Provider:   Donato Schultz, MD  Thank you for choosing [facility]!!

## 2019-12-18 NOTE — Progress Notes (Signed)
Cardiology Office Note:    Date:  12/18/2019   ID:  Kristie Decembereborah Dunlap Owens, DOB 03/17/1955, MRN [id number]  PCP:  Natalia LeatherwoodKuneff, Renee A, DO  [facility] Cardiologist:  Donato SchultzMark Wellington Winegarden, MD  [facility] Electrophysiologist:  None   Referring MD: Natalia LeatherwoodKuneff, Renee A, DO     History of Present Illness:    Kristie Owens is a 64 y.o. female here for follow-up of heart catheterization with angina, diabetes hypertension.  Cardiac catheterization reviewed as below.  Still feels about the same.  Continue to work with medications.  Exercise.  Does feel some nerve sensation over the catheterization site.  Small nodular scarring noted.  Good overall radial pulse.  Past Medical History:  Diagnosis Date  . Achilles bursitis or tendinitis 11/11/2012   right   . Allergy   . Angina at rest [facility](HCC)    Dr Anne FuSkains  . CAD (coronary artery disease), native coronary artery    DES to LAD in 2 areas, one at the bifurcation of a diagonal branch and also in the mid to distal region. Her diagonal branch was also ballooned . She has a DES to RCA 10/10. Her circumflex artery mild ostial disease.  . Chicken pox   . Depression   . DM (diabetes mellitus) (HCC)   . Hx of cardiovascular stress test    ETT- Myoview (2/16):  No ischemia, EF 69%, normal study  . Hyperlipidemia   . Hypertension   . Lumbar pain   . OSA on CPAP    Mild, mild hypoxia 83% AHI -11.1, O2 minimum 83%  . Peroneal tendinitis of left lower extremity 11/11/2012   Left with tear.   . Tonsillar calculus 08/12/2018    Past Surgical History:  Procedure Laterality Date  . BREAST EXCISIONAL BIOPSY Right 1995  . CAROTID STENT    . CESAREAN SECTION  1983  . FOOT SURGERY    . LEFT HEART CATH AND CORONARY ANGIOGRAPHY N/A 06/04/2016   Procedure: Left Heart Cath and Coronary Angiography;  Surgeon: Lyn RecordsSmith, Henry W, MD;  [facility];  Service: Cardiovascular;  Laterality: N/A;  . LEFT HEART CATH AND CORONARY ANGIOGRAPHY N/A 12/04/2019    Procedure: LEFT HEART CATH AND CORONARY ANGIOGRAPHY;  Surgeon: Kathleene HazelMcAlhany, Christopher D, MD;  Location: [facility] INVASIVE CV LAB;  Service: Cardiovascular;  Laterality: N/A;  . LEFT HEART CATHETERIZATION WITH CORONARY ANGIOGRAM N/A 01/16/2011   Procedure: LEFT HEART CATHETERIZATION WITH CORONARY ANGIOGRAM;  Surgeon: Donato SchultzMark Peachie Barkalow, MD;  [facility];  Service: Cardiovascular;  Laterality: N/A;  . NOSE SURGERY      Current Medications: Current Meds  Medication Sig  . amLODipine (NORVASC) 5 MG tablet Take 1 tablet (5 mg total) by mouth daily.  [name]. aspirin EC 81 MG tablet Take 81 mg by mouth daily.    . carvedilol (COREG) 6.25 MG tablet Take 1 tablet (6.25 mg total) by mouth 2 (two) times daily with a meal.  . fluticasone (FLONASE) 50 MCG/ACT nasal spray Place 2 sprays into both nostrils 2 (two) times daily as needed for allergies or rhinitis.  [name]. ibuprofen (ADVIL) 200 MG tablet Take 400 mg by mouth every 6 (six) hours as needed for headache or moderate pain.  . isosorbide mononitrate (IMDUR) 60 MG 24 hr tablet Take 1.5 tablets (90 mg total) by mouth daily.  [name]. lisinopril-hydrochlorothiazide (ZESTORETIC) 20-12.5 MG tablet Take 1 tablet by mouth daily.  . meloxicam (MOBIC) 15 MG tablet Take 1 tablet (15 mg total) by mouth daily.  .[name]  montelukast (SINGULAIR) 10 MG tablet Take 1 tablet (10 mg total) by mouth at bedtime.  . nitroGLYCERIN (NITROSTAT) 0.4 MG SL tablet Place 1 tablet (0.4 mg total) under the tongue every 5 (five) minutes as needed for chest pain.  [name] Olopatadine HCl (PATADAY OP) Place 1 drop into both eyes daily as needed (for itching or dryness).  [name] omeprazole (PRILOSEC OTC) 20 MG tablet Take 20 mg by mouth daily as needed (acid reflux).  . sertraline (ZOLOFT) 100 MG tablet Take 1 tablet (100 mg total) by mouth daily.  [name] zolpidem (AMBIEN) 10 MG tablet Take 1 tablet (10 mg total) by mouth at bedtime.  . [DISCONTINUED] rosuvastatin (CRESTOR) 10 MG tablet Take 1 tablet (10 mg total) by mouth at  bedtime.     Allergies:   Sulfa antibiotics, Wellbutrin [bupropion], and Prednisone   Social History   Socioeconomic History  . Marital status: Married    Spouse name: Not on file  . Number of children: Not on file  . Years of education: Not on file  . Highest education level: Not on file  Occupational History  . Not on file  Tobacco Use  . Smoking status: Never Smoker  . Smokeless tobacco: Never Used  Vaping Use  . Vaping Use: Never used  Substance and Sexual Activity  . Alcohol use: Not Currently  . Drug use: Never  . Sexual activity: Yes    Partners: Male    Comment: husband  Other Topics Concern  . Not on file  Social History Narrative   Marital status/children/pets: Married.  One child.   Education/employment: Retired.  Associates degree in college.   Safety:      -Wears a bicycle helmet riding a bike: Yes     -smoke alarm in the home:Yes     - wears seatbelt: Yes     - Feels safe in their relationships: Yes   Social Determinants of Health   Financial Resource Strain:   . Difficulty of Paying Living Expenses: Not on file  Food Insecurity:   . Worried About Programme researcher, broadcasting/film/video in the Last Year: Not on file  . Ran Out of Food in the Last Year: Not on file  Transportation Needs:   . Lack of Transportation (Medical): Not on file  . Lack of Transportation (Non-Medical): Not on file  Physical Activity:   . Days of Exercise per Week: Not on file  . Minutes of Exercise per Session: Not on file  Stress:   . Feeling of Stress : Not on file  Social Connections:   . Frequency of Communication with Friends and Family: Not on file  . Frequency of Social Gatherings with Friends and Family: Not on file  . Attends Religious Services: Not on file  . Active Member of Clubs or Organizations: Not on file  . Attends Banker Meetings: Not on file  . Marital Status: Not on file     Family History: The patient's family history includes Alcohol abuse in her  brother; Arthritis in her sister; Asthma in her brother; COPD in her mother; Depression in her sister; Diabetes in her sister; Early death in her maternal grandfather and maternal grandmother; Early death (age of onset: 69) in her brother; Heart attack in her brother, paternal grandmother, and another family member; Heart disease in her brother, mother, paternal grandfather, and paternal grandmother; Heart failure in her paternal grandmother; Hyperlipidemia in her brother, brother, paternal grandfather, and paternal grandmother; Hypertension in her brother, brother,  mother, paternal grandfather, paternal grandmother, and sister; Intellectual disability in her brother and brother; Kidney disease in her brother and brother; Lung cancer in her father and maternal grandfather; Miscarriages / [place] in her sister; Suicidality in her maternal grandfather; Tuberculosis in her maternal grandmother; Ulcers in her mother. There is no history of Stroke.  ROS:   Please see the history of present illness.     All other systems reviewed and are negative.  EKGs/Labs/Other Studies Reviewed:    The following studies were reviewed today:  CATH 12/04/19:  Dist LAD lesion is 25% stenosed.  Ost 1st Diag lesion is 40% stenosed.  1st Diag lesion is 25% stenosed.  Ramus lesion is 45% stenosed.  Ost 1st Mrg to 1st Mrg lesion is 50% stenosed.  RPDA lesion is 40% stenosed.  Previously placed Prox RCA to Mid RCA stent (unknown type) is widely patent.  Prox RCA lesion is 40% stenosed.  RPAV lesion is 40% stenosed.  Ost Cx to Prox Cx lesion is 40% stenosed.  Previously placed Prox LAD to Mid LAD stent (unknown type) is widely patent.  Previously placed Mid LAD stent (unknown type) is widely patent.  The left ventricular systolic function is normal.  LV end diastolic pressure is normal.  The left ventricular ejection fraction is greater than 65% by visual estimate.  There is no mitral valve  regurgitation.   1. Patent proximal and mid LAD stents 2. Small caliber circumflex with mild to moderate non-obstructive disease 3. Large dominant RCA with patent mid stent, mild proximal stenosis. Moderate disease in the PDA and posterolateral artery, unchanged from last cath.  4. Normal LV systolic function, LVEF=65-70%. LVEDP 16  Recommendations: Continue medical management of CAD.   Diagnostic Dominance: Right   Echocardiogram 12/07/2019:  1. Left ventricular ejection fraction, by estimation, is 65 to 70%. The  left ventricle has normal function. The left ventricle has no regional  wall motion abnormalities. There is mild left ventricular hypertrophy.  Left ventricular diastolic parameters  are indeterminate.  2. Right ventricular systolic function is normal. The right ventricular  size is normal. Tricuspid regurgitation signal is inadequate for assessing  PA pressure.  3. The mitral valve is normal in structure. No evidence of mitral valve  regurgitation.  4. The aortic valve was not well visualized. Aortic valve regurgitation  is not visualized. Mild aortic valve sclerosis is present, with no  evidence of aortic valve stenosis.   EKG:  EKG is  ordered today.  The ekg ordered today demonstrates sinus rhythm 68 no other abnormalities  Recent Labs: 04/24/2019: ALT 20 11/23/2019: BUN 17; Creatinine, Ser 0.73; Hemoglobin 12.2; Platelets 390; Potassium 4.3; Sodium 136  Recent Lipid Panel    Component Value Date/Time   CHOL 135 04/24/2019 0000   TRIG 169 (A) 04/24/2019 0000   HDL 33 (A) 04/24/2019 0000   LDLCALC 73 04/24/2019 0000     Risk Assessment/Calculations:       Physical Exam:    VS:  BP 112/60   Pulse 68   Ht 5\' 4"  (1.626 m)   Wt 206 lb (93.4 kg)   SpO2 95%   BMI 35.36 kg/m     Wt Readings from Last 3 Encounters:  12/18/19 206 lb (93.4 kg)  12/04/19 202 lb (91.6 kg)  11/17/19 205 lb (93 kg)     GEN:  Well nourished, well developed in no acute  distress HEENT: Normal NECK: No JVD; No carotid bruits LYMPHATICS: No lymphadenopathy CARDIAC: RRR, no murmurs,  rubs, gallops RESPIRATORY:  Clear to auscultation without rales, wheezing or rhonchi  ABDOMEN: Soft, non-tender, non-distended MUSCULOSKELETAL:  No edema; No deformity  SKIN: Warm and dry, right radial wrist site good pulse.  Small nodule scarring noted. NEUROLOGIC:  Alert and oriented x 3 PSYCHIATRIC:  Normal affect   ASSESSMENT:    1. Mixed hyperlipidemia   2. Medication management   3. Essential hypertension   4. Coronary artery disease involving native heart with unstable angina pectoris, unspecified vessel or lesion type [facility])    PLAN:    In order of problems listed above:  Coronary artery disease -Cardiac catheterization reviewed as above.  Continue with aggressive medical management. Since she is about 3 weeks out of catheterization, I think it would be fine for her to massage the radials artery site to help break up the small nodule scar tissue that is there.  Sometimes it is uncomfortable for her.  Angina -Currently on aspirin amlodipine carvedilol isosorbide 90 mg a day and good overall blood pressure control.  Still feeling similar symptoms with exertion.  Continue with medical management.  Showed her pictures of heart cath diagram.  Reassuring.  Hypertension -Controlled on current medications.  No changes made.  Hyperlipidemia -Continue with Crestor.  Would like for her to increase this to 20 mg once a day from 10.  Last LDL 73 from outside labs.  Hemoglobin A1c 5.9 creatinine 0.7 hemoglobin 12.2   Shared Decision Making/Informed Consent        Medication Adjustments/Labs and Tests Ordered: Current medicines are reviewed at length with the patient today.  Concerns regarding medicines are outlined above.  Orders Placed This Encounter  Procedures  . Lipid panel  . ALT  . EKG 12-Lead   Meds ordered this encounter  Medications  . rosuvastatin  (CRESTOR) 20 MG tablet    Sig: Take 1 tablet (20 mg total) by mouth daily.    Dispense:  90 tablet    Refill:  3    Patient Instructions  Medication Instructions:  Please increase Crestor to 20 mg a day. Continue all other medications as listed.  *If you need a refill on your cardiac medications before your next appointment, please call your pharmacy*  Lab Work: Please have blood work in 3 months (Lipid/ALT) If you have labs (blood work) drawn today and your tests are completely normal, you will receive your results only by: [name] MyChart Message (if you have MyChart) OR . A paper copy in the mail If you have any lab test that is abnormal or we need to change your treatment, we will call you to review the results.  Follow-Up: At [facility], you and your health needs are our priority.  As part of our continuing mission to provide you with exceptional heart care, we have created designated Provider Care Teams.  These Care Teams include your primary Cardiologist (physician) and Advanced Practice Providers (APPs -  Physician Assistants and Nurse Practitioners) who all work together to provide you with the care you need, when you need it.  We recommend signing up for the patient portal called "MyChart".  Sign up information is provided on this After Visit Summary.  MyChart is used to connect with patients for Virtual Visits (Telemedicine).  Patients are able to view lab/test results, encounter notes, upcoming appointments, etc.  Non-urgent messages can be sent to your provider as well.   To learn more about what you can do with MyChart, go to [email or web address].    Your next  appointment:   6 month(s)  The format for your next appointment:   In Person  Provider:   Donato Schultz, MD  Thank you for choosing [facility]!!        Signed, Donato Schultz, MD  12/18/2019 10:11 AM    [facility]

## 2019-12-29 ENCOUNTER — Other Ambulatory Visit: Payer: Self-pay

## 2019-12-30 ENCOUNTER — Ambulatory Visit (INDEPENDENT_AMBULATORY_CARE_PROVIDER_SITE_OTHER): Payer: Managed Care, Other (non HMO) | Admitting: Family Medicine

## 2019-12-30 ENCOUNTER — Encounter: Payer: Self-pay | Admitting: Family Medicine

## 2019-12-30 VITALS — BP 117/70 | HR 69 | Temp 99.2°F | Ht 63.0 in | Wt 206.0 lb

## 2019-12-30 DIAGNOSIS — Z1231 Encounter for screening mammogram for malignant neoplasm of breast: Secondary | ICD-10-CM

## 2019-12-30 DIAGNOSIS — I10 Essential (primary) hypertension: Secondary | ICD-10-CM

## 2019-12-30 DIAGNOSIS — M1990 Unspecified osteoarthritis, unspecified site: Secondary | ICD-10-CM

## 2019-12-30 DIAGNOSIS — J309 Allergic rhinitis, unspecified: Secondary | ICD-10-CM | POA: Diagnosis not present

## 2019-12-30 DIAGNOSIS — I25118 Atherosclerotic heart disease of native coronary artery with other forms of angina pectoris: Secondary | ICD-10-CM

## 2019-12-30 DIAGNOSIS — Z0001 Encounter for general adult medical examination with abnormal findings: Secondary | ICD-10-CM

## 2019-12-30 DIAGNOSIS — E2839 Other primary ovarian failure: Secondary | ICD-10-CM

## 2019-12-30 DIAGNOSIS — F5101 Primary insomnia: Secondary | ICD-10-CM | POA: Diagnosis not present

## 2019-12-30 DIAGNOSIS — E7800 Pure hypercholesterolemia, unspecified: Secondary | ICD-10-CM | POA: Diagnosis not present

## 2019-12-30 DIAGNOSIS — R7303 Prediabetes: Secondary | ICD-10-CM

## 2019-12-30 DIAGNOSIS — Z955 Presence of coronary angioplasty implant and graft: Secondary | ICD-10-CM | POA: Diagnosis not present

## 2019-12-30 DIAGNOSIS — F329 Major depressive disorder, single episode, unspecified: Secondary | ICD-10-CM | POA: Diagnosis not present

## 2019-12-30 DIAGNOSIS — I200 Unstable angina: Secondary | ICD-10-CM

## 2019-12-30 DIAGNOSIS — Z1159 Encounter for screening for other viral diseases: Secondary | ICD-10-CM

## 2019-12-30 DIAGNOSIS — R002 Palpitations: Secondary | ICD-10-CM

## 2019-12-30 DIAGNOSIS — Z23 Encounter for immunization: Secondary | ICD-10-CM

## 2019-12-30 LAB — CBC WITH DIFFERENTIAL/PLATELET
Basophils Absolute: 0 10*3/uL (ref 0.0–0.1)
Basophils Relative: 0.5 % (ref 0.0–3.0)
Eosinophils Absolute: 0.1 10*3/uL (ref 0.0–0.7)
Eosinophils Relative: 2.7 % (ref 0.0–5.0)
HCT: 38.4 % (ref 36.0–46.0)
Hemoglobin: 12.6 g/dL (ref 12.0–15.0)
Lymphocytes Relative: 45.2 % (ref 12.0–46.0)
Lymphs Abs: 1.8 10*3/uL (ref 0.7–4.0)
MCHC: 32.9 g/dL (ref 30.0–36.0)
MCV: 80.7 fl (ref 78.0–100.0)
Monocytes Absolute: 0.6 10*3/uL (ref 0.1–1.0)
Monocytes Relative: 15.3 % — ABNORMAL HIGH (ref 3.0–12.0)
Neutro Abs: 1.4 10*3/uL (ref 1.4–7.7)
Neutrophils Relative %: 36.3 % — ABNORMAL LOW (ref 43.0–77.0)
Platelets: 365 10*3/uL (ref 150.0–400.0)
RBC: 4.76 Mil/uL (ref 3.87–5.11)
RDW: 15.8 % — ABNORMAL HIGH (ref 11.5–15.5)
WBC: 4 10*3/uL (ref 4.0–10.5)

## 2019-12-30 LAB — COMPREHENSIVE METABOLIC PANEL
ALT: 22 U/L (ref 0–35)
AST: 24 U/L (ref 0–37)
Albumin: 4.2 g/dL (ref 3.5–5.2)
Alkaline Phosphatase: 89 U/L (ref 39–117)
BUN: 18 mg/dL (ref 6–23)
CO2: 28 mEq/L (ref 19–32)
Calcium: 9.2 mg/dL (ref 8.4–10.5)
Chloride: 103 mEq/L (ref 96–112)
Creatinine, Ser: 0.64 mg/dL (ref 0.40–1.20)
GFR: 92.16 mL/min (ref >60.00)
Glucose, Bld: 111 mg/dL — ABNORMAL HIGH (ref 70–99)
Potassium: 4.2 mEq/L (ref 3.5–5.1)
Sodium: 138 mEq/L (ref 135–145)
Total Bilirubin: 0.4 mg/dL (ref 0.2–1.2)
Total Protein: 7.9 g/dL (ref 6.0–8.3)

## 2019-12-30 LAB — LIPID PANEL
Cholesterol: 125 mg/dL (ref 0–200)
HDL: 34.1 mg/dL — ABNORMAL LOW (ref >39.00)
LDL Cholesterol: 59 mg/dL (ref 0–99)
NonHDL: 91.28
Total CHOL/HDL Ratio: 4
Triglycerides: 159 mg/dL — ABNORMAL HIGH (ref 0.0–149.0)
VLDL: 31.8 mg/dL (ref 0.0–40.0)

## 2019-12-30 LAB — TSH: TSH: 3.22 u[IU]/mL (ref 0.35–4.50)

## 2019-12-30 LAB — HEMOGLOBIN A1C: Hgb A1c MFr Bld: 6.7 % — ABNORMAL HIGH (ref 4.6–6.5)

## 2019-12-30 MED ORDER — FLUTICASONE PROPIONATE 50 MCG/ACT NA SUSP: 2.0000 | Freq: Two times a day (BID) | NASAL | 11 refills | Status: DC | PRN

## 2019-12-30 MED ORDER — SERTRALINE HCL 100 MG PO TABS
100.0000 mg | ORAL_TABLET | Freq: Every day | ORAL | 1 refills | Status: DC
Start: 2019-12-30 — End: 2020-05-31

## 2019-12-30 MED ORDER — AZELASTINE-FLUTICASONE 137-50 MCG/ACT NA SUSP: NASAL | 11 refills | Status: DC

## 2019-12-30 MED ORDER — TRAZODONE HCL 50 MG PO TABS
25.0000 mg | ORAL_TABLET | Freq: Every evening | ORAL | 0 refills | Status: DC | PRN
Start: 2019-12-30 — End: 2020-03-23

## 2019-12-30 MED ORDER — AMLODIPINE BESYLATE 5 MG PO TABS: 5.0000 mg | ORAL_TABLET | Freq: Every day | ORAL | 1 refills | Status: DC

## 2019-12-30 MED ORDER — MELOXICAM 15 MG PO TABS
15.0000 mg | ORAL_TABLET | Freq: Every day | ORAL | 1 refills | Status: DC
Start: 2019-12-30 — End: 2020-05-31

## 2019-12-30 MED ORDER — LISINOPRIL-HYDROCHLOROTHIAZIDE 20-12.5 MG PO TABS
1.0000 | ORAL_TABLET | Freq: Every day | ORAL | 1 refills | Status: DC
Start: 2019-12-30 — End: 2020-05-31

## 2019-12-30 MED ORDER — MONTELUKAST SODIUM 10 MG PO TABS
10.0000 mg | ORAL_TABLET | Freq: Every day | ORAL | 1 refills | Status: DC
Start: 2019-12-30 — End: 2020-05-31

## 2019-12-30 MED ORDER — CARVEDILOL 6.25 MG PO TABS
6.2500 mg | ORAL_TABLET | Freq: Two times a day (BID) | ORAL | 1 refills | Status: DC
Start: 2019-12-30 — End: 2020-05-31

## 2019-12-30 NOTE — Patient Instructions (Addendum)
Health Maintenance After Age 65 After age 65, you are at a higher risk for certain long-term diseases and infections as well as injuries from falls. Falls are a major cause of broken bones and head injuries in people who are older than age 66. Getting regular preventive care can help to keep you healthy and well. Preventive care includes getting regular testing and making lifestyle changes as recommended by your health care provider. Talk with your health care provider about:  Which screenings and tests you should have. A screening is a test that checks for a disease when you have no symptoms.  A diet and exercise plan that is right for you. What should I know about screenings and tests to prevent falls? Screening and testing are the best ways to find a health problem early. Early diagnosis and treatment give you the best chance of managing medical conditions that are common after age 66. Certain conditions and lifestyle choices may make you more likely to have a fall. Your health care provider may recommend:  Regular vision checks. Poor vision and conditions such as cataracts can make you more likely to have a fall. If you wear glasses, make sure to get your prescription updated if your vision changes.  Medicine review. Work with your health care provider to regularly review all of the medicines you are taking, including over-the-counter medicines. Ask your health care provider about any side effects that may make you more likely to have a fall. Tell your health care provider if any medicines that you take make you feel dizzy or sleepy.  Osteoporosis screening. Osteoporosis is a condition that causes the bones to get weaker. This can make the bones weak and cause them to break more easily.  Blood pressure screening. Blood pressure changes and medicines to control blood pressure can make you feel dizzy.  Strength and balance checks. Your health care provider may recommend certain tests to check your  strength and balance while standing, walking, or changing positions.  Foot health exam. Foot pain and numbness, as well as not wearing proper footwear, can make you more likely to have a fall.  Depression screening. You may be more likely to have a fall if you have a fear of falling, feel emotionally low, or feel unable to do activities that you used to do.  Alcohol use screening. Using too much alcohol can affect your balance and may make you more likely to have a fall. What actions can I take to lower my risk of falls? General instructions  Talk with your health care provider about your risks for falling. Tell your health care provider if: ? You fall. Be sure to tell your health care provider about all falls, even ones that seem minor. ? You feel dizzy, sleepy, or off-balance.  Take over-the-counter and prescription medicines only as told by your health care provider. These include any supplements.  Eat a healthy diet and maintain a healthy weight. A healthy diet includes low-fat dairy products, low-fat (lean) meats, and fiber from whole grains, beans, and lots of fruits and vegetables. Home safety  Remove any tripping hazards, such as rugs, cords, and clutter.  Install safety equipment such as grab bars in bathrooms and safety rails on stairs.  Keep rooms and walkways well-lit. Activity   Follow a regular exercise program to stay fit. This will help you maintain your balance. Ask your health care provider what types of exercise are appropriate for you.  If you need a cane or   walker, use it as recommended by your health care provider.  Wear supportive shoes that have nonskid soles. Lifestyle  Do not drink alcohol if your health care provider tells you not to drink.  If you drink alcohol, limit how much you have: ? 0-1 drink a day for women. ? 0-2 drinks a day for men.  Be aware of how much alcohol is in your drink. In the [place] one drink equals one typical bottle of beer (12  oz), one-half glass of wine (5 oz), or one shot of hard liquor (1 oz).  Do not use any products that contain nicotine or tobacco, such as cigarettes and e-cigarettes. If you need help quitting, ask your health care provider. Summary  Having a healthy lifestyle and getting preventive care can help to protect your health and wellness after age 66.  Screening and testing are the best way to find a health problem early and help you avoid having a fall. Early diagnosis and treatment give you the best chance for managing medical conditions that are more common for people who are older than age 66.  Falls are a major cause of broken bones and head injuries in people who are older than age 66. Take precautions to prevent a fall at home.  Work with your health care provider to learn what changes you can make to improve your health and wellness and to prevent falls. This information is not intended to replace advice given to you by your health care provider. Make sure you discuss any questions you have with your health care provider. Document Revised: 05/08/2018 Document Reviewed: 11/28/2016 Elsevier Patient Education  [employer].   If you are age 66 or older, your body mass index should be between 23-30. Your Body mass index is Body mass index is 36.49 kg/m.[name] If this is above the aforementioned range listed, you are consider obese by medical definition and standards. Routine daily exercise and dietary modifications are encouraged. If you would like additional guidance on weight loss, please make an appointment for weight loss counseling - must be an appointment dedicate to weight loss counseling alone. I would be happy to help you.   If you are age 66 or younger, your body mass index should be between 19-25. Your Body mass index is Body mass index is 36.49 kg/m. [name] If this is above the aformentioned range listed, you are consider obese by medical definition and standards. Routine daily exercise  and dietary modifications are encouraged. If you would like additional guidance on weight loss, please make an appointment for weight loss counseling - must be an appointment dedicate to weight loss counseling alone. I would be happy to help you.

## 2019-12-30 NOTE — Progress Notes (Addendum)
This visit occurred during the SARS-CoV-2 public health emergency.  Safety protocols were in place, including screening questions prior to the visit, additional usage of staff PPE, and extensive cleaning of exam room while observing appropriate contact time as indicated for disinfecting solutions.    Patient ID: Kristie Owens, female  DOB: [birth year], 64 y.o.   MRN: [id number] Patient Care Team    Relationship Specialty Notifications Start End  Ma Hillock, DO PCP - General Family Medicine  06/09/19   Jerline Pain, MD PCP - Cardiology Cardiology  12/17/19   Jerline Pain, MD Consulting Physician Cardiology  06/08/19   Otelia Sergeant, OD Referring Physician   06/11/19   Ralene Muskrat Physician Assistant Chiropractic Medicine  06/11/19     Chief Complaint  Patient presents with  . Annual Exam    pt is fasting     Subjective:  Kristie Owens is a 64 y.o.  Female  present for CPE. All past medical history, surgical history, allergies, family history, immunizations, medications and social history were updated in the electronic medical record today. All recent labs, ED visits and hospitalizations within the last year were reviewed.  Health maintenance:  Colonoscopy: completed 2019 at [place], patient states she was told 10-year follow-up.  No records received.  Next due 2029. Mammogram: completed: 10/2017, order placed Cervical cancer screening: last pap: Greater than 65/not indicated Immunizations: tdap UTD 07/2013, Influenza UTD 2021 (encouraged yearly), PNA series completed, Covid series completed with booster. Infectious disease screening: Hep C screening collected today.  Shingrix No. 1 completed today-nurse visit approximately 3 months for Shingrix No. 2 to be scheduled DEXA: Estrogen deficient-ordered DEXA today Assistive device: none Oxygen FVC:[facility] Patient has a Dental home. Hospitalizations/ED visits: reviewed  Hypertension/hyperlipidemia/coronary  artery disease/TIA: Pt reports compliance with lisinopril-HCTZ 20-12.5, amlodipine 5 mg, Coreg 6.25 mg twice daily, Crestor 20 mg daily, Imdur 60 mg daily. Blood pressures ranges at home  within normal limits. Patient denies chest pain, shortness of breath, dizziness or lower extremity edema.  Pt takes a daily baby ASA. Pt is  prescribed statin.  Patient is established with Dr. Marlou Porch for cardiology.  Has a history of coronary artery disease status post heart catheterization in December 2012 and May 2018 demonstrating widely patent LAD and RCA stent, mild irregularity of the ostium of the diagonal where PTCA was felt to be high risk RF: Hypertension, hyperlipidemia, obesity, family history of heart disease  Cardiac catheterization on 06/04/16:  Widely patent proximal and distal LAD stents. 40% ostial narrowing in the first diagonal.  Patent mid right coronary stents. Proximal 40% RCA eccentric narrowing.  Circumflex is a small vessel with 40-50% first obtuse marginal.  Ramus intermedius (or first obtuse marginal depending upon categorization) contains 40% proximal narrowing.  Normal LV function. Elevated LVEDP at 23 mmHg. EF 55%.  Depression/anxiety/insomnia: Patient has a history of depression with anxiety as well as insomnia. She reports compliance with Zoloft 100 mg daily and Ambien 10 mg nightly.  He does feel the Zoloft is working well for her.  We have had a discussion in the past that Ambien does would eventually need to be decreased, secondary to age and she would like to try a different medication today to help with her insomnia.  Allergic rhinitis: Patient reports compliance with Singulair and Flonase nasal spray.  She reports she still has allergy symptoms and feels they have been becoming worse over the last year.  She had been on a  daily allergy medicine however she felt that made her tired.  Prediabetes: Patient has history of mildly elevated A1c in the diabetic range of 6.6.   She has controlled it with diet and exercise.  She is not on medication.   Depression screen [facility] 2/9 12/30/2019 07/22/2019 06/09/2019 10/30/2017  Decreased Interest 0 0 0 0  Down, Depressed, Hopeless 0 0 0 0  PHQ - 2 Score 0 0 0 0  Altered sleeping 0 0 2 -  Tired, decreased energy 0 0 0 -  Change in appetite 0 0 3 -  Feeling bad or failure about yourself  0 0 0 -  Trouble concentrating 0 0 0 -  Moving slowly or fidgety/restless 0 0 0 -  Suicidal thoughts 0 0 0 -  PHQ-9 Score 0 0 5 -  Difficult doing work/chores - Not difficult at all Not difficult at all -   No flowsheet data found.   Immunization History  Administered Date(s) Administered  . Fluad Quad(high Dose 65+) 10/19/2019  . Influenza,inj,Quad PF,6+ Mos 12/15/2013, 12/13/2016  . Influenza-Unspecified 11/21/2018  . Moderna SARS-COVID-2 Vaccination 02/19/2019, 03/27/2019, 12/19/2019  . Pneumococcal Conjugate-13 11/21/2017  . Pneumococcal Polysaccharide-23 11/25/2018  . Tdap 08/10/2013  . Zoster Recombinat (Shingrix) 12/30/2019    Past Medical History:  Diagnosis Date  . Achilles bursitis or tendinitis 11/11/2012   right   . Allergy   . Angina at [facility])    Dr Marlou Porch  . CAD (coronary artery disease), native coronary artery    DES to LAD in 2 areas, one at the bifurcation of a diagonal branch and also in the mid to distal region. Her diagonal branch was also ballooned . She has a DES to RCA 10/10. Her circumflex artery mild ostial disease.  . Chicken pox   . Depression   . DM (diabetes mellitus) ([place])   . Hx of cardiovascular stress test    ETT- Myoview (2/16):  No ischemia, EF 69%, normal study  . Hyperlipidemia   . Hypertension   . Lumbar pain   . OSA on CPAP    Mild, mild hypoxia 83% AHI -11.1, O2 minimum 83%  . Peroneal tendinitis of left lower extremity 11/11/2012   Left with tear.   . Tonsillar calculus 08/12/2018   Allergies  Allergen Reactions  . Sulfa Antibiotics Other (See Comments)    From  childhood- reaction not recalled  . Wellbutrin [Bupropion] Nausea Only    Nausea and headaches   . Prednisone Anxiety and Other (See Comments)    "Made me not feel like myself, altered"   Past Surgical History:  Procedure Laterality Date  . BREAST EXCISIONAL BIOPSY Right 1995  . CAROTID STENT    . CESAREAN SECTION  1983  . FOOT SURGERY    . LEFT HEART CATH AND CORONARY ANGIOGRAPHY N/A 06/04/2016   Procedure: Left Heart Cath and Coronary Angiography;  Surgeon: Belva Crome, MD;  Location: [place] LAB;  Service: Cardiovascular;  Laterality: N/A;  . LEFT HEART CATH AND CORONARY ANGIOGRAPHY N/A 12/04/2019   Procedure: LEFT HEART CATH AND CORONARY ANGIOGRAPHY;  Surgeon: Burnell Blanks, MD;  Location: [facility];  Service: Cardiovascular;  Laterality: N/A;  . LEFT HEART CATHETERIZATION WITH CORONARY ANGIOGRAM N/A 01/16/2011   Procedure: LEFT HEART CATHETERIZATION WITH CORONARY ANGIOGRAM;  Surgeon: Candee Furbish, MD;  Location: [facility] CATH LAB;  Service: Cardiovascular;  Laterality: N/A;  . NOSE SURGERY     Family History  Problem Relation Age of  Onset  . Hyperlipidemia Brother   . Hypertension Brother   . Intellectual disability Brother   . Kidney disease Brother   . Hypertension Mother   . Heart disease Mother   . COPD Mother   . Ulcers Mother   . Lung cancer Father   . Heart failure Paternal Grandmother   . Heart attack Paternal Grandmother   . Hypertension Paternal Grandmother   . Hyperlipidemia Paternal Grandmother   . Heart disease Paternal Grandmother   . Heart attack Brother   . Asthma Brother   . Early death Brother 81  . Hyperlipidemia Brother   . Hypertension Brother   . Intellectual disability Brother   . Kidney disease Brother   . Heart disease Brother   . Hypertension Sister   . Diabetes Sister   . Early death Maternal Grandmother   . Tuberculosis Maternal Grandmother   . Lung cancer Maternal Grandfather   . Suicidality Maternal Grandfather    . Early death Maternal Grandfather   . Heart disease Paternal Grandfather   . Hyperlipidemia Paternal Grandfather   . Hypertension Paternal Grandfather   . Heart attack Other   . Arthritis Sister   . Depression Sister   . Miscarriages / Stillbirths Sister   . Alcohol abuse Brother   . Stroke Neg Hx    Social History   Social History Narrative   Marital status/children/pets: Married.  One child.   Education/employment: Retired.  Associates degree in college.   Safety:      -Wears a bicycle helmet riding a bike: Yes     -smoke alarm in the home:Yes     - wears seatbelt: Yes     - Feels safe in their relationships: Yes    Allergies as of 12/30/2019      Reactions   Sulfa Antibiotics Other (See Comments)   From childhood- reaction not recalled   Wellbutrin [bupropion] Nausea Only   Nausea and headaches    Prednisone Anxiety, Other (See Comments)   "Made me not feel like myself, altered"      Medication List       Accurate as of December 30, 2019 11:59 PM. If you have any questions, ask your nurse or doctor.        STOP taking these medications   fluticasone 50 MCG/ACT nasal spray Commonly known as: FLONASE Stopped by: Howard Pouch, DO   ibuprofen 200 MG tablet Commonly known as: ADVIL Stopped by: Howard Pouch, DO   zolpidem 10 MG tablet Commonly known as: AMBIEN Stopped by: Howard Pouch, DO     TAKE these medications   amLODipine 5 MG tablet Commonly known as: NORVASC Take 1 tablet (5 mg total) by mouth daily.   aspirin EC 81 MG tablet Take 81 mg by mouth daily.   Azelastine-Fluticasone 137-50 MCG/ACT Susp 1 spray per nostril BID Started by: Howard Pouch, DO   carvedilol 6.25 MG tablet Commonly known as: COREG Take 1 tablet (6.25 mg total) by mouth 2 (two) times daily with a meal.   isosorbide mononitrate 60 MG 24 hr tablet Commonly known as: IMDUR Take 1.5 tablets (90 mg total) by mouth daily.   lisinopril-hydrochlorothiazide 20-12.5 MG  tablet Commonly known as: ZESTORETIC Take 1 tablet by mouth daily.   meloxicam 15 MG tablet Commonly known as: MOBIC Take 1 tablet (15 mg total) by mouth daily.   montelukast 10 MG tablet Commonly known as: SINGULAIR Take 1 tablet (10 mg total) by mouth at bedtime.   nitroGLYCERIN  0.4 MG SL tablet Commonly known as: NITROSTAT Place 1 tablet (0.4 mg total) under the tongue every 5 (five) minutes as needed for chest pain.   omeprazole 20 MG tablet Commonly known as: PRILOSEC OTC Take 20 mg by mouth daily as needed (acid reflux).   PATADAY OP Place 1 drop into both eyes daily as needed (for itching or dryness).   rosuvastatin 20 MG tablet Commonly known as: CRESTOR Take 1 tablet (20 mg total) by mouth daily.   sertraline 100 MG tablet Commonly known as: ZOLOFT Take 1 tablet (100 mg total) by mouth daily.   traZODone 50 MG tablet Commonly known as: DESYREL Take 0.5-2 tablets (25-100 mg total) by mouth at bedtime as needed for sleep. Started by: Howard Pouch, DO       All past medical history, surgical history, allergies, family history, immunizations andmedications were updated in the EMR today and reviewed under the history and medication portions of their EMR.     No results found.   ROS: 14 pt review of systems performed and negative (unless mentioned in an HPI)  Objective: BP 117/70   Pulse 69   Temp 99.2 F (37.3 C) (Oral)   Ht 5' 3" (1.6 m)   Wt 206 lb (93.4 kg)   SpO2 96%   BMI 36.49 kg/m  Gen: Afebrile. No acute distress. Nontoxic in appearance, well-developed, well-nourished, pleasant obese female HENT: AT. [place]. Bilateral TM visualized and normal in appearance, normal external auditory canal. MMM, no oral lesions, adequate dentition. Bilateral nares within normal limits. Throat without erythema, ulcerations or exudates.  No cough on exam, no hoarseness on exam. Eyes:Pupils Equal Round Reactive to light, Extraocular movements intact,  Conjunctiva without  redness, discharge or icterus. Neck/lymp/endocrine: Supple, no lymphadenopathy, no thyromegaly CV: RRR no murmur, no edema, +2/4 P posterior tibialis pulses.  Chest: CTAB, no wheeze, rhonchi or crackles.  Normal respiratory effort.  Good air movement. Abd: Soft.  Flat.  Mild tenderness to palpation right midline epigastric .ND. BS present.  No masses palpated. No hepatosplenomegaly. No rebound tenderness or guarding. Skin: No rashes, purpura or petechiae. Warm and well-perfused. Skin intact. Neuro/Msk:  Normal gait. PERLA. EOMi. Alert. Oriented x3.  Cranial nerves II through XII intact. Muscle strength 5/5 upper/lower extremity. DTRs equal bilaterally. Psych: Normal affect, dress and demeanor. Normal speech. Normal thought content and judgment.   No exam data present  Assessment/plan: Yentl Verge is a 64 y.o. female present for CPE  osteoarthritis -Continue Mobic qd with food.  - continue  OTC voltaren gel/cream.  - discussed cherry tart extract and tumeric natural supplements and their SE potentials  - ANA, RF, CCP, ESR, CRP > WNL  Major depressive disorder, remission status unspecified, unspecified whether recurrent/insomnia Stable Continue Zoloft 100 mg daily. Discontinue Ambien.  Start trazodone taper 25-75 mg nightly. Prediabetes A1c collected today. Patient encouraged to continue exercise and dietary modifications.  Consider low-dose Metformin start for metabolic syndrome if I0X increases in the future. - POCT glycosylated hemoglobin (Hb A1C)  Essential hypertension/hyperlipidemia/Presence of coronary angioplasty implant and graft/Atherosclerosis of native coronary artery of native heart with other form of angina pectoris (HCC)/Intermediate coronary syndrome (HCC)/ Palpitations/Transient neurologic deficit/Morbid obesity (HCC) Stable Continue daily exercise. Continue low-sodium/heart healthy diet. Continue Imdur> prescribed by cardiology. Continue amlodipine 5 mg  daily Continue Coreg 6.25 mg twice daily Continue lisinopril-HCTZ 20-12.5 mg daily Continue Crestor 20 mg daily Continue baby aspirin daily  Allergic rhinitis, unspecified seasonality, unspecified trigger Patient encouraged to try Allegra  OTC for better coverage that is nonsedating DC Flonase.  Start Dymista. Continue Singulair Referral to allergy and asthma in [facility] placed.  Has had allergy injections in the past.  Encounter for preventative health exam with abnormal findings: Patient was encouraged to exercise greater than 150 minutes a week. Patient was encouraged to choose a diet filled with fresh fruits and vegetables, and lean meats. AVS provided to patient today for education/recommendation on gender specific health and safety maintenance. Colonoscopy: completed 2019 at [place], patient states she was told 10-year follow-up.  No records received.  Next due 2029. Mammogram: completed: 10/2017, order placed Cervical cancer screening: last pap: Greater than 65/not indicated Immunizations: tdap UTD 07/2013, Influenza UTD 2021 (encouraged yearly), PNA series completed, Covid series completed with booster. Infectious disease screening: Hep C screening collected today.  Shingrix No. 1 completed today-nurse visit approximately 3 months for Shingrix No. 2 to be scheduled DEXA: Estrogen deficient-ordered DEXA today  Return in about 23 weeks (around 06/08/2020) for [place] (30 min) and 1 yr cpe.  Orders Placed This Encounter  Procedures  . MM 3D SCREEN BREAST BILATERAL  . DG Bone Density  . Varicella-zoster vaccine IM  . CBC with Differential/Platelet  . Comprehensive metabolic panel  . Hemoglobin A1c  . Lipid panel  . TSH  . Hepatitis C Antibody  . Ambulatory referral to Allergy  . HM COLONOSCOPY    Meds ordered this encounter  Medications  . sertraline (ZOLOFT) 100 MG tablet    Sig: Take 1 tablet (100 mg total) by mouth daily.    Dispense:  90 tablet    Refill:  1  .  montelukast (SINGULAIR) 10 MG tablet    Sig: Take 1 tablet (10 mg total) by mouth at bedtime.    Dispense:  90 tablet    Refill:  1  . meloxicam (MOBIC) 15 MG tablet    Sig: Take 1 tablet (15 mg total) by mouth daily.    Dispense:  90 tablet    Refill:  1  . lisinopril-hydrochlorothiazide (ZESTORETIC) 20-12.5 MG tablet    Sig: Take 1 tablet by mouth daily.    Dispense:  90 tablet    Refill:  1  . carvedilol (COREG) 6.25 MG tablet    Sig: Take 1 tablet (6.25 mg total) by mouth 2 (two) times daily with a meal.    Dispense:  180 tablet    Refill:  1    pls discontinue any other rx for Carvedilol  . amLODipine (NORVASC) 5 MG tablet    Sig: Take 1 tablet (5 mg total) by mouth daily.    Dispense:  90 tablet    Refill:  1  . DISCONTD: fluticasone (FLONASE) 50 MCG/ACT nasal spray    Sig: Place 2 sprays into both nostrils 2 (two) times daily as needed for allergies or rhinitis.    Dispense:  16 g    Refill:  11  . traZODone (DESYREL) 50 MG tablet    Sig: Take 0.5-2 tablets (25-100 mg total) by mouth at bedtime as needed for sleep.    Dispense:  60 tablet    Refill:  0  . Azelastine-Fluticasone 137-50 MCG/ACT SUSP    Sig: 1 spray per nostril BID    Dispense:  23 g    Refill:  11    DC flonase    Referral Orders     Ambulatory referral to Allergy   Electronically signed by: Howard Pouch, DO [place]

## 2019-12-31 ENCOUNTER — Telehealth: Payer: Self-pay | Admitting: Family Medicine

## 2019-12-31 LAB — HEPATITIS C ANTIBODY
Hepatitis C Ab: NONREACTIVE
SIGNAL TO CUT-OFF: 0.03 (ref <1.00)

## 2019-12-31 MED ORDER — METFORMIN HCL 500 MG PO TABS: 500.0000 mg | ORAL_TABLET | Freq: Every day | ORAL | 1 refills | Status: DC

## 2019-12-31 NOTE — Telephone Encounter (Signed)
Please call patient Kristie Owens, kidney and thyroid function are normal Blood cell counts and electrolytes are normal Diabetes /A1c increased from 5.9 to 6.7 (- this is technically out of the prediabetic range in the diabetic range now.  She would benefit from starting a low dose once daily metformin. Tighter control on her sugars with help with heart health as well- which  Is important for her with her history. Of course low carb/sugar diet and increasing exercise regimen will also help lower her sugars.  Cholesterol panel looks great and is at goal cholesterol 125 and LDL 59.  Hep C screen is still pending- this lab takes a few days to result. We will call her with this result only if positive.    I have called metformin in for her.  Next appt 5.5 months.

## 2019-12-31 NOTE — Telephone Encounter (Signed)
Spoke with pt regarding labs and instructions.   

## 2020-01-01 ENCOUNTER — Encounter: Payer: Self-pay | Admitting: Family Medicine

## 2020-01-05 ENCOUNTER — Encounter: Payer: Self-pay | Admitting: Family Medicine

## 2020-01-25 ENCOUNTER — Telehealth: Payer: Self-pay

## 2020-01-25 NOTE — Telephone Encounter (Signed)
Please call pt and confirm nightly dose. We started her on trazodone 30 days ago with instructions to taper dose. We will need to know what dose she ended up taking in order to refill for her.   Thanks.

## 2020-01-25 NOTE — Telephone Encounter (Signed)
Received a refill request for a 90 day supply of: Trazodone 50 mg tabs LR 12/30/19, #60, 0 rf LOV 12/30/19 FOV 06/06/20  Please advise.  Thanks.  Dm/cma

## 2020-01-26 ENCOUNTER — Telehealth: Payer: Self-pay

## 2020-01-26 NOTE — Telephone Encounter (Signed)
No referrals have been placed. Please enter referral to [facility].

## 2020-01-26 NOTE — Telephone Encounter (Signed)
Orders for bone density and mammogram were placed by this provider 12/30/2019.  Per EMR review, there are 2 open orders for both the above desired screening test placed.  If the imaging center has not reached out to her as of yet, she can be provided with the number to call them to schedule.  We do not place "referrals" for these screening tests, will place orders for them to be completed.  Thanks

## 2020-01-26 NOTE — Telephone Encounter (Signed)
Please follow up. Pt states that she has not received a call to get scheduled for bone density and mammogram. Orders place 12/30/19

## 2020-01-26 NOTE — Telephone Encounter (Signed)
Pt does not need refill for this medication. Pt states it does not work for her.

## 2020-01-26 NOTE — Telephone Encounter (Signed)
Called patient, apologized for [facility] not scheduling her yet, gave her their office number and suggested she call them to schedule.

## 2020-01-26 NOTE — Telephone Encounter (Signed)
Please advise, needing referral placed

## 2020-01-27 NOTE — Progress Notes (Signed)
New Patient Note  RE: Kristie Owens MRN: [id number] DOB: December 19, 1955 Date of Office Visit: 01/28/2020  Referring provider: Natalia Leatherwood, DO Primary care provider: Natalia Leatherwood, DO  Chief Complaint: Allergic Rhinitis   History of Present Illness: I had the pleasure of seeing Kristie Owens for initial evaluation at the [facility] on 01/28/2020. She is a 64 y.o. female, who is referred here by Felix Pacini A, DO for the evaluation of allergic rhinitis.  She reports symptoms of rhinorrhea, nasal congestion, headaches, ear/face pain, dizziness, sneezing, coughing, itchy/watery eyes. Symptoms have been going on for many years but worse the past 2 years. The symptoms are present all year around. Other triggers include exposure to unknown. Anosmia: diminished sense of smell. Headache: yes. She has used dymista, Singulair with fair improvement in symptoms. Zyrtec, Claritin makes her sleepy. Sinus infections: none this year. Previous work up includes: skin testing in the 80's and was on allergy injection for 5 years with good benefit. Previous ENT evaluation: sinus surgery in the 90s. Previous sinus imaging: no. History of nasal polyps: yes possibly - 1 year ago seen by previous PCP.  Last eye exam: 1 month ago. History of reflux: omeprazole as needed.  Patient had COVID-19 in the fall of 2020.   12/30/2019 PCP visit: "Allergic rhinitis: Patient reports compliance with Singulair and Flonase nasal spray. She reports she still has allergy symptoms and feels they have been becoming worse over the last year. She had been on a daily allergy medicine however she felt that made her tired."  Assessment and Plan: Kristie Owens is a 64 y.o. female with: Other allergic rhinitis Perennial rhinoconjunctivitis symptoms for many years but worse the last 2.  Patient was on allergy immunotherapy for 5 years in the 56s with good benefit.  Status post sinus surgery in the 1990s.   Possibly PCP saw polyps 1 year ago. Started Dymista 2 weeks ago.   Today's skin testing showed: Positive to grass, tree, weed pollen, ragweed, cockroach and dust mites.  Start environmental control measures as below.  May use over the counter antihistamines such as Zyrtec (cetirizine), Claritin (loratadine), Allegra (fexofenadine), or Xyzal (levocetirizine) daily as needed.  Take at night. Make sure to take on the days of injections.  Continue dymista (fluticasone + azelastine nasal spray combination) 1 spray per nostril twice a day.  Nasal saline spray (i.e., Simply Saline) or nasal saline lavage (i.e., NeilMed) is recommended as needed and prior to medicated nasal sprays.  If no improvement in sinus symptoms, will do a trial of Xhance due to possible polyps noted on exam.  Continue Singulair (montelukast)  daily at night.  May use olopatadine eye drops 0.2% once a day as needed for itchy/watery eyes.  Start allergy injections - for long term control.   Had a detailed discussion with patient/family that clinical history is suggestive of allergic rhinitis, and may benefit from allergy immunotherapy (AIT). Discussed in detail regarding the dosing, schedule, side effects (mild to moderate local allergic reaction and rarely systemic allergic reactions including anaphylaxis), and benefits (significant improvement in nasal symptoms, seasonal flares of asthma) of immunotherapy with the patient. There is significant time commitment involved with allergy shots, which includes weekly immunotherapy injections for first 9-12 months and then biweekly to monthly injections for 3-5 years. Consent was signed.  I have prescribed epinephrine injectable and demonstrated proper use. For mild symptoms you can take over the counter antihistamines such as Benadryl and monitor symptoms  closely. If symptoms worsen or if you have severe symptoms including breathing issues, throat closure, significant swelling,  whole body hives, severe diarrhea and vomiting, lightheadedness then inject epinephrine and seek immediate medical care afterwards. Emergency action plan given.  Allergic conjunctivitis of both eyes  See assessment and plan as above for allergic rhinitis.  Return in about 2 months (around 03/28/2020).  Meds ordered this encounter  Medications  . EPINEPHrine 0.3 mg/0.3 mL IJ SOAJ injection    Sig: Inject 0.3 mg into the muscle as needed for anaphylaxis.    Dispense:  0.3 mL    Refill:  2    Generic Mylan Brand   Other allergy screening: Asthma: no Food allergy: no Medication allergy: yes  Hymenoptera allergy: no Urticaria: no Eczema:no History of recurrent infections suggestive of immunodeficency: no  Diagnostics: Skin Testing: Environmental allergy panel. Positive to grass, tree, weed pollen, ragweed, cockroach and dust mites. Results discussed with patient/family.  Airborne Adult Perc - 01/28/20 0902    Time Antigen Placed 0902    Allergen Manufacturer Waynette Buttery    Location Back    Number of Test 59    Panel 1 Select    1. Control-Buffer 50% Glycerol Negative    2. Control-Histamine 1 mg/ml 2+    3. Albumin saline Negative    4. Bahia Negative    5. [place] 2+    6. Johnson 2+    7. Kentucky Blue Negative    8. Meadow Fescue Negative    9. Perennial Rye Negative    10. Sweet Vernal Negative    11. Timothy Negative    12. Cocklebur Negative    13. Burweed Marshelder Negative    14. Ragweed, short Negative    15. Ragweed, Giant Negative    16. Plantain,  English Negative    17. Lamb's Quarters Negative    18. Sheep Sorrell Negative    19. Rough Pigweed Negative    20. Marsh Elder, Rough Negative    21. Mugwort, Common Negative    22. Ash mix Negative    23. Birch mix Negative    24. Beech American Negative    25. Box, Elder --   +/-   26. Cedar, red 2+    27. Cottonwood, Guinea-Bissau Negative    28. Elm mix Negative    29. Hickory Negative    30. Maple mix  Negative    31. Oak, Guinea-Bissau mix Negative    32. Pecan Pollen Negative    33. Pine mix Negative    34. Sycamore Eastern Negative    35. Walnut, Black Pollen Negative    36. Alternaria alternata Negative    37. Cladosporium Herbarum Negative    38. Aspergillus mix Negative    39. Penicillium mix Negative    40. Bipolaris sorokiniana (Helminthosporium) Negative    41. Drechslera spicifera (Curvularia) Negative    42. Mucor plumbeus Negative    43. Fusarium moniliforme Negative    44. Aureobasidium pullulans (pullulara) Negative    45. Rhizopus oryzae Negative    46. Botrytis cinera Negative    47. Epicoccum nigrum Negative    48. Phoma betae Negative    49. Candida Albicans Negative    50. Trichophyton mentagrophytes Negative    51. Mite, D Farinae  5,000 AU/ml Negative    52. Mite, D Pteronyssinus  5,000 AU/ml Negative    53. Cat Hair 10,000 BAU/ml Negative    54.  Dog Epithelia Negative    55. Mixed  Feathers Negative    56. Horse Epithelia Negative    57. Cockroach, German Negative    58. Mouse Negative    59. Tobacco Leaf Negative          Intradermal - 01/28/20 0942    Time Antigen Placed 7673    Allergen Manufacturer Waynette Buttery    Location Arm    Number of Test 12    Intradermal Select    Control Negative    7 Grass 2+    Ragweed mix 2+    Weed mix 2+    Mold 1 Negative    Mold 2 Negative    Mold 3 Negative    Mold 4 Negative    Cat Negative    Dog Negative    Cockroach 2+    Mite mix 2+    Other Omitted           Past Medical History: Patient Active Problem List   Diagnosis Date Noted  . Allergic conjunctivitis of both eyes 01/28/2020  . Shortness of breath   . Arthritis 07/27/2019  . Other allergic rhinitis 07/27/2019  . Insomnia 07/27/2019  . Morbid obesity (HCC) 06/11/2019  . Depression   . Prediabetes   . Transient neurologic deficit 07/02/2018  . IBS (irritable bowel syndrome) 04/08/2016  . Presence of coronary angioplasty implant and graft  04/08/2016  . Palpitations 12/15/2013  . Hyperlipidemia 12/15/2013  . Essential hypertension 12/15/2013  . Coronary atherosclerosis of native coronary artery 01/16/2011  . Intermediate coronary syndrome (HCC) 01/16/2011   Past Medical History:  Diagnosis Date  . Achilles bursitis or tendinitis 11/11/2012   right   . Allergy   . Angina at rest [facility])    Dr Anne Fu  . CAD (coronary artery disease), native coronary artery    DES to LAD in 2 areas, one at the bifurcation of a diagonal branch and also in the mid to distal region. Her diagonal branch was also ballooned . She has a DES to RCA 10/10. Her circumflex artery mild ostial disease.  . Chicken pox   . Depression   . DM (diabetes mellitus) (HCC)   . Hx of cardiovascular stress test    ETT- Myoview (2/16):  No ischemia, EF 69%, normal study  . Hyperlipidemia   . Hypertension   . Lumbar pain   . OSA on CPAP    Mild, mild hypoxia 83% AHI -11.1, O2 minimum 83%  . Peroneal tendinitis of left lower extremity 11/11/2012   Left with tear.   . Tonsillar calculus 08/12/2018   Past Surgical History: Past Surgical History:  Procedure Laterality Date  . BREAST EXCISIONAL BIOPSY Right 1995  . CAROTID STENT    . CESAREAN SECTION  1983  . FOOT SURGERY    . LEFT HEART CATH AND CORONARY ANGIOGRAPHY N/A 06/04/2016   Procedure: Left Heart Cath and Coronary Angiography;  Surgeon: Lyn Records, MD;  [facility]AS[facility] CV LAB;  Service: Cardiovascular;  Laterality: N/A;  . LEFT HEART CATH AND CORONARY ANGIOGRAPHY N/A 12/04/2019   Procedure: LEFT HEART CATH AND CORONARY ANGIOGRAPHY;  Surgeon: Kathleene Hazel, MD;  Location: [facility] INVASIVE CV LAB;  Service: Cardiovascular;  Laterality: N/A;  . LEFT HEART CATHETERIZATION WITH CORONARY ANGIOGRAM N/A 01/16/2011   Procedure: LEFT HEART CATHETERIZATION WITH CORONARY ANGIOGRAM;  Surgeon: Donato Schultz, MD;  Location: [facility] CATH LAB;  Service: Cardiovascular;  Laterality: N/A;  . NOSE SURGERY    .  SINOSCOPY     Medication List:  Current  Outpatient Medications  Medication Sig Dispense Refill  . amLODipine (NORVASC) 5 MG tablet Take 1 tablet (5 mg total) by mouth daily. 90 tablet 1  . aspirin EC 81 MG tablet Take 81 mg by mouth daily.    . Azelastine-Fluticasone 137-50 MCG/ACT SUSP 1 spray per nostril BID 23 g 11  . carvedilol (COREG) 6.25 MG tablet Take 1 tablet (6.25 mg total) by mouth 2 (two) times daily with a meal. 180 tablet 1  . EPINEPHrine 0.3 mg/0.3 mL IJ SOAJ injection Inject 0.3 mg into the muscle as needed for anaphylaxis. 0.3 mL 2  . isosorbide mononitrate (IMDUR) 60 MG 24 hr tablet Take 1.5 tablets (90 mg total) by mouth daily. 45 tablet 11  . lisinopril-hydrochlorothiazide (ZESTORETIC) 20-12.5 MG tablet Take 1 tablet by mouth daily. 90 tablet 1  . meloxicam (MOBIC) 15 MG tablet Take 1 tablet (15 mg total) by mouth daily. 90 tablet 1  . metFORMIN (GLUCOPHAGE) 500 MG tablet Take 1 tablet (500 mg total) by mouth daily with breakfast. 90 tablet 1  . metroNIDAZOLE (METROGEL) 0.75 % vaginal gel 1 application as needed for rosacea    . montelukast (SINGULAIR) 10 MG tablet Take 1 tablet (10 mg total) by mouth at bedtime. 90 tablet 1  . nitroGLYCERIN (NITROSTAT) 0.4 MG SL tablet Place 1 tablet (0.4 mg total) under the tongue every 5 (five) minutes as needed for chest pain. 25 tablet 3  . Olopatadine HCl (PATADAY OP) Place 1 drop into both eyes daily as needed (for itching or dryness).    [name] omeprazole (PRILOSEC OTC) 20 MG tablet Take 20 mg by mouth daily as needed (acid reflux).    . rosuvastatin (CRESTOR) 20 MG tablet Take 1 tablet (20 mg total) by mouth daily. 90 tablet 3  . sertraline (ZOLOFT) 100 MG tablet Take 1 tablet (100 mg total) by mouth daily. 90 tablet 1  . zolpidem (AMBIEN) 10 MG tablet 0.5-1 tablet at bedtime as needed    . traZODone (DESYREL) 50 MG tablet Take 0.5-2 tablets (25-100 mg total) by mouth at bedtime as needed for sleep. (Patient not taking: Reported on  01/28/2020) 60 tablet 0   No current facility-administered medications for this visit.   Allergies: Allergies  Allergen Reactions  . Bupropion Nausea Only and Other (See Comments)    Nausea and headaches   . Sulfa Antibiotics Other (See Comments)    From childhood- reaction not recalled  . Prednisone Anxiety and Other (See Comments)    "Made me not feel like myself, altered"   Social History: Social History   Socioeconomic History  . Marital status: Married    Spouse name: Not on file  . Number of children: Not on file  . Years of education: Not on file  . Highest education level: Not on file  Occupational History  . Not on file  Tobacco Use  . Smoking status: Never Smoker  . Smokeless tobacco: Never Used  Vaping Use  . Vaping Use: Never used  Substance and Sexual Activity  . Alcohol use: Not Currently  . Drug use: Never  . Sexual activity: Yes    Partners: Male    Comment: husband  Other Topics Concern  . Not on file  Social History Narrative   Marital status/children/pets: Married.  One child.   Education/employment: Retired.  Associates degree in college.   Safety:      -Wears a bicycle helmet riding a bike: Yes     -smoke alarm in the home:Yes     -  wears seatbelt: Yes     - Feels safe in their relationships: Yes   Social Determinants of Health   Financial Resource Strain: Not on file  Food Insecurity: Not on file  Transportation Needs: Not on file  Physical Activity: Not on file  Stress: Not on file  Social Connections: Not on file   Lives in a 66 year old house. Smoking: denies Occupation: retired  Landscape architectnvironmental HistorySurveyor, minerals: Water Damage/mildew in the house: no Engineer, civil (consulting)Carpet in the family room: no Carpet in the bedroom: no Heating: heat pump Cooling: heat pump Pet: no  Family History: Family History  Problem Relation Age of Onset  . Hyperlipidemia Brother   . Hypertension Brother   . Intellectual disability Brother   . Kidney disease Brother   .  Hypertension Mother   . Heart disease Mother   . COPD Mother   . Ulcers Mother   . Lung cancer Father   . Heart failure Paternal Grandmother   . Heart attack Paternal Grandmother   . Hypertension Paternal Grandmother   . Hyperlipidemia Paternal Grandmother   . Heart disease Paternal Grandmother   . Heart attack Brother   . Asthma Brother   . Early death Brother [id number]  . Hyperlipidemia Brother   . Hypertension Brother   . Intellectual disability Brother   . Kidney disease Brother   . Heart disease Brother   . Hypertension Sister   . Diabetes Sister   . Early death Maternal Grandmother   . Tuberculosis Maternal Grandmother   . Lung cancer Maternal Grandfather   . Suicidality Maternal Grandfather   . Early death Maternal Grandfather   . Heart disease Paternal Grandfather   . Hyperlipidemia Paternal Grandfather   . Hypertension Paternal Grandfather   . Heart attack Other   . Arthritis Sister   . Depression Sister   . Miscarriages / Stillbirths Sister   . Alcohol abuse Brother   . Stroke Neg Hx    Review of Systems  Constitutional: Negative for appetite change, chills, fever and unexpected weight change.  HENT: Positive for congestion, postnasal drip, rhinorrhea and sneezing.   Eyes: Negative for itching.  Respiratory: Negative for cough, chest tightness, shortness of breath and wheezing.   Cardiovascular: Negative for chest pain.  Gastrointestinal: Negative for abdominal pain.  Genitourinary: Negative for difficulty urinating.  Skin: Negative for rash.  Allergic/Immunologic: Positive for environmental allergies.  Neurological: Positive for headaches.   Objective: BP 120/64   Pulse 80   Temp (!) 96.2 F (35.7 C) (Temporal)   Resp 18   Ht 5' 4.37" (1.635 m)   Wt 206 lb 8 oz (93.7 kg)   SpO2 97%   BMI 35.04 kg/m  Body mass index is 35.04 kg/m. Physical Exam Vitals and nursing note reviewed.  Constitutional:      Appearance: Normal appearance. She is  well-developed.  HENT:     Head: Normocephalic and atraumatic.     Right Ear: Tympanic membrane and external ear normal.     Left Ear: Tympanic membrane and external ear normal.     Nose:     Comments: ? Nasal polyp bilaterally    Mouth/Throat:     Mouth: Mucous membranes are moist.     Pharynx: Oropharynx is clear.  Eyes:     Conjunctiva/sclera: Conjunctivae normal.  Cardiovascular:     Rate and Rhythm: Normal rate and regular rhythm.     Heart sounds: Normal heart sounds. No murmur heard. No friction rub. No gallop.  Pulmonary:     Effort: Pulmonary effort is normal.     Breath sounds: Normal breath sounds. No wheezing, rhonchi or rales.  Musculoskeletal:     Cervical back: Neck supple.  Skin:    General: Skin is warm.     Findings: No rash.  Neurological:     Mental Status: She is alert and oriented to person, place, and time.  Psychiatric:        Behavior: Behavior normal.    The plan was reviewed with the patient/family, and all questions/concerned were addressed.  It was my pleasure to see Kristie Owens today and participate in her care. Please feel free to contact me with any questions or concerns.  Sincerely,  Wyline Mood, DO Allergy & Immunology  [facility] office: [phone] [facility] office: [phone]

## 2020-01-28 ENCOUNTER — Ambulatory Visit (INDEPENDENT_AMBULATORY_CARE_PROVIDER_SITE_OTHER): Payer: Managed Care, Other (non HMO) | Admitting: Allergy

## 2020-01-28 ENCOUNTER — Other Ambulatory Visit: Payer: Self-pay

## 2020-01-28 ENCOUNTER — Encounter: Payer: Self-pay | Admitting: Allergy

## 2020-01-28 VITALS — BP 120/64 | HR 80 | Temp 96.2°F | Resp 18 | Ht 64.37 in | Wt 206.5 lb

## 2020-01-28 DIAGNOSIS — H1013 Acute atopic conjunctivitis, bilateral: Secondary | ICD-10-CM | POA: Diagnosis not present

## 2020-01-28 DIAGNOSIS — J3089 Other allergic rhinitis: Secondary | ICD-10-CM

## 2020-01-28 MED ORDER — EPINEPHRINE 0.3 MG/0.3ML IJ SOAJ: 0.3000 mg | INTRAMUSCULAR | 2 refills | Status: DC | PRN

## 2020-01-28 NOTE — Patient Instructions (Addendum)
Today's skin testing showed: Positive to grass, tree, weed pollen, ragweed, cockroach and dust mites.  Environmental allergies  Start environmental control measures as below.  May use over the counter antihistamines such as Zyrtec (cetirizine), Claritin (loratadine), Allegra (fexofenadine), or Xyzal (levocetirizine) daily as needed.  Take at night.   Make sure to take on the days of your injections.  Continue dymista (fluticasone + azelastine nasal spray combination) 1 spray per nostril twice a day.  Nasal saline spray (i.e., Simply Saline) or nasal saline lavage (i.e., NeilMed) is recommended as needed and prior to medicated nasal sprays.  Continue Singulair (montelukast) 10mg  daily at night.  May use olopatadine eye drops 0.2% once a day as needed for itchy/watery eyes.  This can make your eyes dryer.   Start allergy injections.   Had a detailed discussion with patient/family that clinical history is suggestive of allergic rhinitis, and may benefit from allergy immunotherapy (AIT). Discussed in detail regarding the dosing, schedule, side effects (mild to moderate local allergic reaction and rarely systemic allergic reactions including anaphylaxis), and benefits (significant improvement in nasal symptoms, seasonal flares of asthma) of immunotherapy with the patient. There is significant time commitment involved with allergy shots, which includes weekly immunotherapy injections for first 9-12 months and then biweekly to monthly injections for 3-5 years. Consent was signed.  I have prescribed epinephrine injectable and demonstrated proper use. For mild symptoms you can take over the counter antihistamines such as Benadryl and monitor symptoms closely. If symptoms worsen or if you have severe symptoms including breathing issues, throat closure, significant swelling, whole body hives, severe diarrhea and vomiting, lightheadedness then inject epinephrine and seek immediate medical care  afterwards.  Emergency action plan  Follow up in 2 months or sooner if needed.  Follow up in 3 weeks for first allergy injection.   Reducing Pollen Exposure . Pollen seasons: trees (spring), grass (summer) and ragweed/weeds (fall). [birth year]1980 Keep windows closed in your home and car to lower pollen exposure.  [name] air conditioning in the bedroom and throughout the house if possible.  . Avoid going out in dry windy days - especially early morning. . Pollen counts are highest between 5 - 10 AM and on dry, hot and windy days.  . Save outside activities for late afternoon or after a heavy rain, when pollen levels are lower.  . Avoid mowing of grass if you have grass pollen allergy. Lilian Kapur Be aware that pollen can also be transported indoors on people and pets.  . Dry your clothes in an automatic dryer rather than hanging them outside where they might collect pollen.  . Rinse hair and eyes before bedtime.  Control of House Dust Mite Allergen . Dust mite allergens are a common trigger of allergy and asthma symptoms. While they can be found throughout the house, these microscopic creatures thrive in warm, humid environments such as bedding, upholstered furniture and carpeting. . Because so much time is spent in the bedroom, it is essential to reduce mite levels there.  . Encase pillows, mattresses, and box springs in special allergen-proof fabric covers or airtight, zippered plastic covers.  . Bedding should be washed weekly in hot water (130 F) and dried in a hot dryer. Allergen-proof covers are available for comforters and pillows that can't be regularly washed.  [name] the allergy-proof covers every few months. Minimize clutter in the bedroom. Keep pets out of the bedroom.  Reyes Ivan Keep humidity less than 50% by using a dehumidifier or air conditioning. You  can buy a humidity measuring device called a hygrometer to monitor this.  . If possible, replace carpets with hardwood, linoleum, or washable area rugs.  If that's not possible, vacuum frequently with a vacuum that has a HEPA filter. . Remove all upholstered furniture and non-washable window drapes from the bedroom. . Remove all non-washable stuffed toys from the bedroom.  Wash stuffed toys weekly. Cockroach Allergen Avoidance Cockroaches are often found in the homes of densely populated urban areas, schools or commercial buildings, but these creatures can lurk almost anywhere. This does not mean that you have a dirty house or living area. . Block all areas where roaches can enter the home. This includes crevices, wall cracks and windows.  . Cockroaches need water to survive, so fix and seal all leaky faucets and pipes. Have an exterminator go through the house when your family and pets are gone to eliminate any remaining roaches. [name] Keep food in lidded containers and put pet food dishes away after your pets are done eating. Vacuum and sweep the floor after meals, and take out garbage and recyclables. Use lidded garbage containers in the kitchen. Wash dishes immediately after use and clean under stoves, refrigerators or toasters where crumbs can accumulate. Wipe off the stove and other kitchen surfaces and cupboards regularly.

## 2020-01-28 NOTE — Progress Notes (Signed)
Aeroallergen Immunotherapy    Patient Details  Name: Kristie Owens  MRN: [id number]  Date of Birth: 06/24/1955   Order 2 of 2   Vial Label: Dm-Cr   0.3 ml (Volume) 1:20 Concentration -- Cockroach, German  0.5 ml (Volume)  AU Concentration -- Mite Mix (DF 5,000 & DP 5,000)    0.8 ml Extract Subtotal  4.2 ml Diluent  5.0 ml Maintenance Total    Final Concentration above is stated in weight/volume (wt/vol). Allergen units (AU/ml) biological units (BAU/ml). The total volume is 5 ml.    Schedule: B   Special Instructions: may come twice a week during build up.

## 2020-01-28 NOTE — Assessment & Plan Note (Addendum)
Perennial rhinoconjunctivitis symptoms for many years but worse the last 2.  Patient was on allergy immunotherapy for 5 years in the 4s with good benefit.  Status post sinus surgery in the 1990s.  Possibly PCP saw polyps 1 year ago. Started Dymista 2 weeks ago.   Today's skin testing showed: Positive to grass, tree, weed pollen, ragweed, cockroach and dust mites.  Start environmental control measures as below.  May use over the counter antihistamines such as Zyrtec (cetirizine), Claritin (loratadine), Allegra (fexofenadine), or Xyzal (levocetirizine) daily as needed.  Take at night. Make sure to take on the days of injections.  Continue dymista (fluticasone + azelastine nasal spray combination) 1 spray per nostril twice a day.  Nasal saline spray (i.e., Simply Saline) or nasal saline lavage (i.e., NeilMed) is recommended as needed and prior to medicated nasal sprays.  If no improvement in sinus symptoms, will do a trial of Xhance due to possible polyps noted on exam.  Continue Singulair (montelukast) 10mg  daily at night.  May use olopatadine eye drops 0.2% once a day as needed for itchy/watery eyes.  Start allergy injections - for long term control.   Had a detailed discussion with patient/family that clinical history is suggestive of allergic rhinitis, and may benefit from allergy immunotherapy (AIT). Discussed in detail regarding the dosing, schedule, side effects (mild to moderate local allergic reaction and rarely systemic allergic reactions including anaphylaxis), and benefits (significant improvement in nasal symptoms, seasonal flares of asthma) of immunotherapy with the patient. There is significant time commitment involved with allergy shots, which includes weekly immunotherapy injections for first 9-12 months and then biweekly to monthly injections for 3-5 years. Consent was signed.  I have prescribed epinephrine injectable and demonstrated proper use. For mild symptoms you can take  over the counter antihistamines such as Benadryl and monitor symptoms closely. If symptoms worsen or if you have severe symptoms including breathing issues, throat closure, significant swelling, whole body hives, severe diarrhea and vomiting, lightheadedness then inject epinephrine and seek immediate medical care afterwards. Emergency action plan given.

## 2020-01-28 NOTE — Assessment & Plan Note (Signed)
   See assessment and plan as above for allergic rhinitis.  

## 2020-01-28 NOTE — Progress Notes (Signed)
Aeroallergen Immunotherapy    Patient Details  Name: Kristie Owens  MRN: [id number]  Date of Birth: 03/24/1955   Order 1 of 2   Vial Label: [id number]   0.3 ml (Volume) BAU Concentration -- 7 Grass Mix* 100,000 ([address] [place], [place], [place], [place], [place], [place], [place])  0.3 ml (Volume) BAU Concentration -- [place] 10,000  0.2 ml (Volume) 1:20 Concentration -- [place]  0.3 ml (Volume) 1:20 Concentration -- Ragweed Mix  0.5 ml (Volume) 1:20 Concentration -- Weed Mix*  0.2 ml (Volume) 1:20 Concentration -- [place]  0.2 ml (Volume) 1:10 Concentration -- Cedar, red    2.0 ml Extract Subtotal  3.0 ml Diluent  5.0 ml Maintenance Total    Final Concentration above is stated in weight/volume (wt/vol). Allergen units (AU/ml) biological units (BAU/ml). The total volume is 5 ml.    Schedule: B   Special Instructions: may come twice a week during build up.

## 2020-02-01 DIAGNOSIS — J302 Other seasonal allergic rhinitis: Secondary | ICD-10-CM

## 2020-02-01 NOTE — Progress Notes (Addendum)
VIALS EXP 01-31-21.  REPRINT LABELS 

## 2020-02-02 DIAGNOSIS — J3089 Other allergic rhinitis: Secondary | ICD-10-CM

## 2020-02-18 ENCOUNTER — Other Ambulatory Visit: Payer: Self-pay

## 2020-02-18 ENCOUNTER — Ambulatory Visit (INDEPENDENT_AMBULATORY_CARE_PROVIDER_SITE_OTHER): Payer: Managed Care, Other (non HMO)

## 2020-02-18 DIAGNOSIS — J309 Allergic rhinitis, unspecified: Secondary | ICD-10-CM | POA: Diagnosis not present

## 2020-02-25 ENCOUNTER — Ambulatory Visit (INDEPENDENT_AMBULATORY_CARE_PROVIDER_SITE_OTHER): Payer: Managed Care, Other (non HMO)

## 2020-02-25 ENCOUNTER — Other Ambulatory Visit: Payer: Self-pay

## 2020-02-25 DIAGNOSIS — J309 Allergic rhinitis, unspecified: Secondary | ICD-10-CM | POA: Diagnosis not present

## 2020-03-01 NOTE — Progress Notes (Addendum)
Immunotherapy   Patient Details  Name: Kristie Owens MRN: [id number] Date of Birth: 1955/04/21  03/01/2020  [name] started allergy injections today and waited in the office for 30 minutes without any reactions. Following schedule: B Frequency:Weekly Epi-Pen:Yes  Consent signed and patient instructions given.   [name] 03/01/2020, 2:20 PM

## 2020-03-03 ENCOUNTER — Encounter: Payer: Self-pay | Admitting: Family Medicine

## 2020-03-03 ENCOUNTER — Other Ambulatory Visit: Payer: Self-pay

## 2020-03-03 ENCOUNTER — Ambulatory Visit (INDEPENDENT_AMBULATORY_CARE_PROVIDER_SITE_OTHER): Payer: Managed Care, Other (non HMO)

## 2020-03-03 DIAGNOSIS — J309 Allergic rhinitis, unspecified: Secondary | ICD-10-CM

## 2020-03-03 NOTE — Telephone Encounter (Signed)
I would encourage her to increase the trazodone to 150 mg nightly. She can still make an appointment to discuss treatment plan, but I would like for her to test out the trazodone to maximum potential before we consider a different option.  Especially given age greater than 60, we cannot order Ambien 10 mg for her.  Literally.  Even if ordered, pharmacies will not fill that dose in women over 65.

## 2020-03-03 NOTE — Telephone Encounter (Signed)
LVM for Kristie Owens to CB regarding rx request clarification.   Note:  Kristie Owens is asking for new script for Ambien but was prescribed trazodone. Kristie Owens is suppose to increase trazodone to 75 mg if needed. If Kristie Owens is needing to restart Ambien she will need to schedule an OV to be reevaluated.

## 2020-03-03 NOTE — Telephone Encounter (Signed)
LVM for pt to read mychart message below and respond.

## 2020-03-04 NOTE — Telephone Encounter (Signed)
Spoke with pt regarding rx and instructions.   

## 2020-03-09 ENCOUNTER — Telehealth: Payer: Self-pay

## 2020-03-09 NOTE — Telephone Encounter (Signed)
LM requesting call back.  Patient scheduled for [place] for Shingrix #2. Also has OV appt with PCP same day (03/10/20).  Currently Shingrix vaccine unavailable, will need to r/s or schedule at another [place] office.

## 2020-03-10 ENCOUNTER — Ambulatory Visit: Payer: Managed Care, Other (non HMO)

## 2020-03-10 ENCOUNTER — Other Ambulatory Visit: Payer: Self-pay

## 2020-03-10 ENCOUNTER — Ambulatory Visit: Payer: Managed Care, Other (non HMO) | Admitting: Family Medicine

## 2020-03-10 ENCOUNTER — Ambulatory Visit (INDEPENDENT_AMBULATORY_CARE_PROVIDER_SITE_OTHER): Payer: Managed Care, Other (non HMO)

## 2020-03-10 DIAGNOSIS — J309 Allergic rhinitis, unspecified: Secondary | ICD-10-CM | POA: Diagnosis not present

## 2020-03-17 ENCOUNTER — Ambulatory Visit (INDEPENDENT_AMBULATORY_CARE_PROVIDER_SITE_OTHER): Payer: Managed Care, Other (non HMO)

## 2020-03-17 ENCOUNTER — Other Ambulatory Visit: Payer: Self-pay

## 2020-03-17 DIAGNOSIS — J309 Allergic rhinitis, unspecified: Secondary | ICD-10-CM | POA: Diagnosis not present

## 2020-03-18 ENCOUNTER — Telehealth: Payer: Self-pay | Admitting: Cardiology

## 2020-03-18 NOTE — Telephone Encounter (Signed)
Spoke with pt and reminded her Lipid/ALT were ordered in Nov after she was started on Crestor.  Pt states understanding and had no further questions.

## 2020-03-18 NOTE — Telephone Encounter (Signed)
Kristie Owens is calling wanting to know why she is scheduled for labs. Please advise.

## 2020-03-23 ENCOUNTER — Ambulatory Visit (INDEPENDENT_AMBULATORY_CARE_PROVIDER_SITE_OTHER): Payer: Managed Care, Other (non HMO) | Admitting: Family Medicine

## 2020-03-23 ENCOUNTER — Encounter: Payer: Self-pay | Admitting: Family Medicine

## 2020-03-23 ENCOUNTER — Other Ambulatory Visit: Payer: Managed Care, Other (non HMO)

## 2020-03-23 ENCOUNTER — Other Ambulatory Visit: Payer: Self-pay

## 2020-03-23 VITALS — BP 146/70 | HR 67 | Temp 98.9°F | Ht 64.0 in | Wt 205.0 lb

## 2020-03-23 DIAGNOSIS — F5101 Primary insomnia: Secondary | ICD-10-CM | POA: Diagnosis not present

## 2020-03-23 DIAGNOSIS — H6091 Unspecified otitis externa, right ear: Secondary | ICD-10-CM | POA: Insufficient documentation

## 2020-03-23 DIAGNOSIS — E7800 Pure hypercholesterolemia, unspecified: Secondary | ICD-10-CM | POA: Insufficient documentation

## 2020-03-23 DIAGNOSIS — F3340 Major depressive disorder, recurrent, in remission, unspecified: Secondary | ICD-10-CM

## 2020-03-23 DIAGNOSIS — E119 Type 2 diabetes mellitus without complications: Secondary | ICD-10-CM | POA: Insufficient documentation

## 2020-03-23 DIAGNOSIS — R251 Tremor, unspecified: Secondary | ICD-10-CM | POA: Insufficient documentation

## 2020-03-23 DIAGNOSIS — T753XXA Motion sickness, initial encounter: Secondary | ICD-10-CM | POA: Insufficient documentation

## 2020-03-23 DIAGNOSIS — K146 Glossodynia: Secondary | ICD-10-CM

## 2020-03-23 DIAGNOSIS — E785 Hyperlipidemia, unspecified: Secondary | ICD-10-CM | POA: Insufficient documentation

## 2020-03-23 DIAGNOSIS — F329 Major depressive disorder, single episode, unspecified: Secondary | ICD-10-CM | POA: Diagnosis not present

## 2020-03-23 DIAGNOSIS — N251 Nephrogenic diabetes insipidus: Secondary | ICD-10-CM | POA: Insufficient documentation

## 2020-03-23 DIAGNOSIS — G4733 Obstructive sleep apnea (adult) (pediatric): Secondary | ICD-10-CM | POA: Insufficient documentation

## 2020-03-23 DIAGNOSIS — G479 Sleep disorder, unspecified: Secondary | ICD-10-CM | POA: Insufficient documentation

## 2020-03-23 DIAGNOSIS — F339 Major depressive disorder, recurrent, unspecified: Secondary | ICD-10-CM | POA: Insufficient documentation

## 2020-03-23 DIAGNOSIS — I119 Hypertensive heart disease without heart failure: Secondary | ICD-10-CM | POA: Insufficient documentation

## 2020-03-23 DIAGNOSIS — F331 Major depressive disorder, recurrent, moderate: Secondary | ICD-10-CM | POA: Insufficient documentation

## 2020-03-23 DIAGNOSIS — G4721 Circadian rhythm sleep disorder, delayed sleep phase type: Secondary | ICD-10-CM | POA: Insufficient documentation

## 2020-03-23 DIAGNOSIS — E1169 Type 2 diabetes mellitus with other specified complication: Secondary | ICD-10-CM | POA: Insufficient documentation

## 2020-03-23 HISTORY — DX: Tremor, unspecified: R25.1

## 2020-03-23 HISTORY — DX: Glossodynia: K14.6

## 2020-03-23 MED ORDER — HYDROXYZINE HCL 10 MG PO TABS
10.0000 mg | ORAL_TABLET | Freq: Every evening | ORAL | 0 refills | Status: DC | PRN
Start: 2020-03-23 — End: 2020-11-21

## 2020-03-23 MED ORDER — SCOPOLAMINE 1 MG/3DAYS TD PT72: 1.0000 | MEDICATED_PATCH | TRANSDERMAL | 0 refills | Status: DC

## 2020-03-23 MED ORDER — BELSOMRA 10 MG PO TABS: 1.0000 | ORAL_TABLET | Freq: Every evening | ORAL | 0 refills | Status: DC | PRN

## 2020-03-23 NOTE — Progress Notes (Signed)
This visit occurred during the SARS-CoV-2 public health emergency.  Safety protocols were in place, including screening questions prior to the visit, additional usage of staff PPE, and extensive cleaning of exam room while observing appropriate contact time as indicated for disinfecting solutions.    Patient ID: Kristie Owens, female  DOB: 07/07/1955, 65 y.o.   MRN: [id number] Patient Care Team    Relationship Specialty Notifications Start End  Natalia LeatherwoodKuneff, Macarius Ruark A, DO PCP - General Family Medicine  06/09/19   Jake BatheSkains, Mark C, MD PCP - Cardiology Cardiology  12/17/19   Jake BatheSkains, Mark C, MD Consulting Physician Cardiology  06/08/19   Jill SideBarts, Sarah H, OD Referring Physician   06/11/19   Moody BruinsLineberry, Keith H Physician Assistant Chiropractic Medicine  06/11/19     Chief Complaint  Patient presents with  . Insomnia    Pt having trouble with sleeping with medication switch; pt hasn't taken benzo in the last 3 weeks    Subjective: Kristie BryantDeborah D Sullenberger is a 65 y.o.  Female  present for Depression/anxiety/insomnia:  Pt reports her depression is well controlled, but she is still unable to sleep well since stopping ambien 10 mg qhs d/t age. She tried the trazodone and it did not work at 100 mg and felt too much and dizzy at 150 mg qhs. She is practicing good sleep hygiene.  prior note.  Patient has a history of depression with anxiety as well as insomnia. She reports compliance with Zoloft 100 mg daily and Ambien 10 mg nightly.  He does feel the Zoloft is working well for her.  We have had a discussion in the past that Ambien does would eventually need to be decreased, secondary to age and she would like to try a different medication today to help with her insomnia.    [facility] 12/30/2019 07/22/2019 06/09/2019 10/30/2017  Decreased Interest 0 0 0 0  Down, Depressed, Hopeless 0 0 0 0  PHQ - 2 Score 0 0 0 0  Altered sleeping 0 0 2 -  Tired, decreased energy 0 0 0 -  Change in appetite 0 0 3 -   Feeling bad or failure about yourself  0 0 0 -  Trouble concentrating 0 0 0 -  Moving slowly or fidgety/restless 0 0 0 -  Suicidal thoughts 0 0 0 -  PHQ-9 Score 0 0 5 -  Difficult doing work/chores - Not difficult at all Not difficult at all -   No flowsheet data found.   Immunization History  Administered Date(s) Administered  . Fluad Quad(high Dose 65+) 10/19/2019  . Influenza Split 12/28/2010, 11/22/2011, 11/13/2012  . Influenza, High Dose Seasonal PF 11/21/2018  . Influenza,inj,Quad PF,6+ Mos 12/15/2013, 12/13/2016, 11/21/2017  . Influenza,inj,quad, With Preservative 09/19/2015  . Influenza-Unspecified 11/21/2018  . Moderna Sars-Covid-2 Vaccination 02/19/2019, 03/27/2019, 12/19/2019  . Pneumococcal Conjugate-13 11/21/2017  . Pneumococcal Polysaccharide-23 11/25/2018  . Tdap 08/10/2013  . Zoster Recombinat (Shingrix) 12/30/2019    Past Medical History:  Diagnosis Date  . Achilles bursitis or tendinitis 11/11/2012   right   . Allergy   . Angina at rest [facility](HCC)    Dr Anne FuSkains  . CAD (coronary artery disease), native coronary artery    DES to LAD in 2 areas, one at the bifurcation of a diagonal branch and also in the mid to distal region. Her diagonal branch was also ballooned . She has a DES to RCA 10/10. Her circumflex artery mild ostial disease.  . Chicken pox   .  Depression   . DM (diabetes mellitus) (HCC)   . 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 Hx of cardiovascular stress test    ETT- Myoview (2/16):  No ischemia, EF 69%, normal study  . Hyperlipidemia   . Hypertension   . Lumbar pain   . OSA on CPAP    Mild, mild hypoxia 83% AHI -11.1, O2 minimum 83%  . Peroneal tendinitis of left lower extremity 11/11/2012   Left with tear.   . Tonsillar calculus 08/12/2018   Allergies  Allergen Reactions  . Bupropion Nausea Only and Other (See Comments)    Nausea and headaches   . Sulfa Antibiotics Other (See Comments)    From childhood- reaction not recalled  . Prednisone Anxiety and  Other (See Comments)    "Made me not feel like myself, altered"   Past Surgical History:  Procedure Laterality Date  . BREAST EXCISIONAL BIOPSY Right 1995  . CAROTID STENT    . CESAREAN SECTION  1983  . FOOT SURGERY    . LEFT HEART CATH AND CORONARY ANGIOGRAPHY N/A 06/04/2016   Procedure: Left Heart Cath and Coronary Angiography;  Surgeon: Lyn Records, MD;  Location: [facility] INVASIVE CV LAB;  Service: Cardiovascular;  Laterality: N/A;  . LEFT HEART CATH AND CORONARY ANGIOGRAPHY N/A 12/04/2019   Procedure: LEFT HEART CATH AND CORONARY ANGIOGRAPHY;  Surgeon: Kathleene Hazel, MD;  Location: [facility] INVASIVE CV LAB;  Service: Cardiovascular;  Laterality: N/A;  . LEFT HEART CATHETERIZATION WITH CORONARY ANGIOGRAM N/A 01/16/2011   Procedure: LEFT HEART CATHETERIZATION WITH CORONARY ANGIOGRAM;  Surgeon: Donato Schultz, MD;  Location[facility] C[facility] LAB;  Service: Cardiovascular;  Laterality: N/A;  . NOSE SURGERY    . SINOSCOPY     Family History  Problem Relation Age of Onset  . Hyperlipidemia Brother   . Hypertension Brother   . Intellectual disability Brother   . Kidney disease Brother   . Hypertension Mother   . Heart disease Mother   . COPD Mother   . Ulcers Mother   . Lung cancer Father   . Heart failure Paternal Grandmother   . Heart attack Paternal Grandmother   . Hypertension Paternal Grandmother   . Hyperlipidemia Paternal Grandmother   . Heart disease Paternal Grandmother   . Heart attack Brother   . Asthma Brother   . Early death Brother 35  . Hyperlipidemia Brother   . Hypertension Brother   . Intellectual disability Brother   . Kidney disease Brother   . Heart disease Brother   . Hypertension Sister   . Diabetes Sister   . Early death Maternal Grandmother   . Tuberculosis Maternal Grandmother   . Lung cancer Maternal Grandfather   . Suicidality Maternal Grandfather   . Early death Maternal Grandfather   . Heart disease Paternal Grandfather   . Hyperlipidemia Paternal  Grandfather   . Hypertension Paternal Grandfather   . Heart attack Other   . Arthritis Sister   . Depression Sister   . Miscarriages / Stillbirths Sister   . Alcohol abuse Brother   . Stroke Neg Hx    Social History   Social History Narrative   Marital status/children/pets: Married.  One child.   Education/employment: Retired.  Associates degree in college.   Safety:      -Wears a bicycle helmet riding a bike: Yes     -smoke alarm in the home:Yes     - wears seatbelt: Yes     - Feels safe in their relationships: Yes  Allergies as of 03/23/2020      Reactions   Bupropion Nausea Only, Other (See Comments)   Nausea and headaches    Sulfa Antibiotics Other (See Comments)   From childhood- reaction not recalled   Prednisone Anxiety, Other (See Comments)   "Made me not feel like myself, altered"      Medication List       Accurate as of March 23, 2020  9:36 AM. If you have any questions, ask your nurse or doctor.        STOP taking these medications   traZODone 50 MG tablet Commonly known as: DESYREL Stopped by: Felix Pacini, DO   zolpidem 10 MG tablet Commonly known as: AMBIEN Stopped by: Felix Pacini, DO     TAKE these medications   amLODipine 5 MG tablet Commonly known as: NORVASC Take 1 tablet (5 mg total) by mouth daily.   aspirin EC 81 MG tablet Take 81 mg by mouth daily.   Azelastine-Fluticasone 137-50 MCG/ACT Susp 1 spray per nostril BID   Belsomra 10 MG Tabs Generic drug: Suvorexant Take 1 tablet by mouth at bedtime as needed. Started by: Felix Pacini, DO   carvedilol 6.25 MG tablet Commonly known as: COREG Take 1 tablet (6.25 mg total) by mouth 2 (two) times daily with a meal.   EPINEPHrine 0.3 mg/0.3 mL Soaj injection Commonly known as: EPI-PEN Inject 0.3 mg into the muscle as needed for anaphylaxis.   hydrOXYzine 10 MG tablet Commonly known as: ATARAX/VISTARIL Take 1-5 tablets (10-50 mg total) by mouth at bedtime as needed. Started  by: Felix Pacini, DO   isosorbide mononitrate 60 MG 24 hr tablet Commonly known as: IMDUR Take 1.5 tablets (90 mg total) by mouth daily.   lisinopril-hydrochlorothiazide 20-12.5 MG tablet Commonly known as: ZESTORETIC Take 1 tablet by mouth daily.   meloxicam 15 MG tablet Commonly known as: MOBIC Take 1 tablet (15 mg total) by mouth daily.   metFORMIN 500 MG tablet Commonly known as: GLUCOPHAGE Take 1 tablet (500 mg total) by mouth daily with breakfast.   metroNIDAZOLE 0.75 % vaginal gel Commonly known as: METROGEL 1 application as needed for rosacea   montelukast 10 MG tablet Commonly known as: SINGULAIR Take 1 tablet (10 mg total) by mouth at bedtime.   nitroGLYCERIN 0.4 MG SL tablet Commonly known as: NITROSTAT Place 1 tablet (0.4 mg total) under the tongue every 5 (five) minutes as needed for chest pain.   omeprazole 20 MG tablet Commonly known as: PRILOSEC OTC Take 20 mg by mouth daily as needed (acid reflux).   PATADAY OP Place 1 drop into both eyes daily as needed (for itching or dryness).   rosuvastatin 20 MG tablet Commonly known as: CRESTOR Take 1 tablet (20 mg total) by mouth daily.   scopolamine 1 MG/3DAYS Commonly known as: Transderm-Scop (1.5 MG) Place 1 patch (1.5 mg total) onto the skin every 3 (three) days. Started by: Felix Pacini, DO   sertraline 100 MG tablet Commonly known as: ZOLOFT Take 1 tablet (100 mg total) by mouth daily. What changed: when to take this       All past medical history, surgical history, allergies, family history, immunizations andmedications were updated in the EMR today and reviewed under the history and medication portions of their EMR.     No results found.   ROS: 14 pt review of systems performed and negative (unless mentioned in an HPI)  Objective: BP (!) 146/70   Pulse 67   Temp 98.9  F (37.2 C) (Oral)   Ht 5\' 4"  (1.626 m)   Wt 205 lb (93 kg)   SpO2 96%   BMI 35.19 kg/m  Gen: Afebrile. No acute  distress.  HENT: AT. [place].  Eyes:Pupils Equal Round Reactive to light, Extraocular movements intact,  Conjunctiva without redness, discharge or icterus..  Neuro:  Normal gait. PERLA. EOMi. Alert. Oriented x3 Psych: Normal affect, dress and demeanor. Normal speech. Normal thought content and judgment..   No exam data present  Assessment/plan: AMIR GLAUS is a 65 y.o. female present for  Major depressive disorder, remission status unspecified, unspecified whether recurrent/insomnia worsening sleep cycles since stopping ambien. Discussed other options for her today continue Zoloft 100 mg daily. Discontinue Ambien.   Tried: trazodone Start vistaril 10-50 mg qhs. Taper instructions provided verbally and on avs.  Of vistartil not effective- printed script for belsomra trial F/u 4 weeks virtual.   Motion sickness: - upcoming cruise- pt request scoplamine [patch. She has used these in the past without side effects   Return in about 4 weeks (around 04/20/2020) for virtual follow up.  No orders of the defined types were placed in this encounter.   Meds ordered this encounter  Medications  . hydrOXYzine (ATARAX/VISTARIL) 10 MG tablet    Sig: Take 1-5 tablets (10-50 mg total) by mouth at bedtime as needed.    Dispense:  90 tablet    Refill:  0  . Suvorexant (BELSOMRA) 10 MG TABS    Sig: Take 1 tablet by mouth at bedtime as needed.    Dispense:  30 tablet    Refill:  0  . scopolamine (TRANSDERM-SCOP, 1.5 MG,) 1 MG/3DAYS    Sig: Place 1 patch (1.5 mg total) onto the skin every 3 (three) days.    Dispense:  4 patch    Refill:  0   Referral Orders  No referral(s) requested today     Electronically signed by: 04/22/2020, DO [facility] [place]

## 2020-03-23 NOTE — Patient Instructions (Addendum)
Start vistaril 1 tab about 1 hour before bed. Every 2 nights increase by 1 tab if needed- up to 5 tabs a night.   Try this for 2-3 weeks. If it does not work- try belsomra (printed script) 1 tab before bed for 1 weeks and if needed may increase to 2 tabs after that.   Also called in scopolamine for you for your cruise.

## 2020-03-24 ENCOUNTER — Ambulatory Visit (INDEPENDENT_AMBULATORY_CARE_PROVIDER_SITE_OTHER): Payer: Managed Care, Other (non HMO)

## 2020-03-24 DIAGNOSIS — J309 Allergic rhinitis, unspecified: Secondary | ICD-10-CM

## 2020-03-30 NOTE — Progress Notes (Signed)
Follow Up Note  RE: Kristie Owens MRN: [id number] DOB: 1956-01-03 Date of Office Visit: 03/31/2020  Referring provider: Natalia Leatherwood, DO Primary care provider: Natalia Leatherwood, DO  Chief Complaint: Allergic Rhinitis   History of Present Illness: I had the pleasure of seeing Kristie Owens for a follow up visit at the [facility] on 03/31/2020. She is a 65 y.o. female, who is being followed for allergic rhinoconjunctivitis on AIT. Her previous allergy office visit was on 01/28/2020 with Dr. Selena Batten. Today is a regular follow up visit.  Allergic rhino conjunctivitis Started allergy injections with no itching but noticed some sore arm only on the left side.   Currently taking Singulair daily and Advil cold and sinus a few times per month for sinus headaches.  Patient started dymista and not sure if it's helping yet.   Patient has been using hydroxyzine for sleeping with good benefit.   Assessment and Plan: Kristie Owens is a 65 y.o. female with: Seasonal and perennial allergic rhinoconjunctivitis Past history - Perennial rhinoconjunctivitis symptoms for many years but worse the last 2.  Patient was on allergy immunotherapy for 5 years in the 26s with good benefit.  Status post sinus surgery in the 1990s.  Possibly PCP saw polyps 1 year ago. 2021 skin testing showed: Positive to grass, tree, weed pollen, ragweed, cockroach and dust mites. Interim history - Started AIT on 03/01/2020 (Dm- Cr & G-Rw-W-T). No polyps noted on today's exam.   Continue environmental control measures as below. May use dymista (fluticasone + azelastine nasal spray combination) 1 spray per nostril twice a day.  Nasal saline spray (i.e., Simply Saline) or nasal saline lavage (i.e., NeilMed) is recommended as needed and prior to medicated nasal sprays.  Continue Singulair (montelukast) 10mg  daily at night.  May use olopatadine eye drops 0.2% once a day as needed for itchy/watery eyes.  Continue  allergy injections - given today.  You may take an antihistamine in the morning if needed - such as allegra, Claritin, or zyrtec.  She is already taking hydroxyzine at night as a sleeping aid.  Return in about 6 months (around 10/01/2020).  No orders of the defined types were placed in this encounter.  Lab Orders  No laboratory test(s) ordered today    Diagnostics: None.  Medication List:  Current Outpatient Medications  Medication Sig Dispense Refill  . aspirin EC 81 MG tablet Take 81 mg by mouth daily.    . Azelastine-Fluticasone 137-50 MCG/ACT SUSP 1 spray per nostril BID 23 g 11  . carvedilol (COREG) 6.25 MG tablet Take 1 tablet (6.25 mg total) by mouth 2 (two) times daily with a meal. 180 tablet 1  . EPINEPHrine 0.3 mg/0.3 mL IJ SOAJ injection Inject 0.3 mg into the muscle as needed for anaphylaxis. 0.3 mL 2  . hydrOXYzine (ATARAX/VISTARIL) 10 MG tablet Take 1-5 tablets (10-50 mg total) by mouth at bedtime as needed. 90 tablet 0  . lisinopril-hydrochlorothiazide (ZESTORETIC) 20-12.5 MG tablet Take 1 tablet by mouth daily. 90 tablet 1  . meloxicam (MOBIC) 15 MG tablet Take 1 tablet (15 mg total) by mouth daily. 90 tablet 1  . metFORMIN (GLUCOPHAGE) 500 MG tablet Take 1 tablet (500 mg total) by mouth daily with breakfast. 90 tablet 1  . metroNIDAZOLE (METROGEL) 0.75 % vaginal gel 1 application as needed for rosacea    . montelukast (SINGULAIR) 10 MG tablet Take 1 tablet (10 mg total) by mouth at bedtime. 90 tablet 1  .  nitroGLYCERIN (NITROSTAT) 0.4 MG SL tablet Place 1 tablet (0.4 mg total) under the tongue every 5 (five) minutes as needed for chest pain. 25 tablet 3  . Olopatadine HCl (PATADAY OP) Place 1 drop into both eyes daily as needed (for itching or dryness).    [name] omeprazole (PRILOSEC OTC) 20 MG tablet Take 20 mg by mouth daily as needed (acid reflux).    . rosuvastatin (CRESTOR) 20 MG tablet Take 1 tablet (20 mg total) by mouth daily. 90 tablet 3  . scopolamine  (TRANSDERM-SCOP, 1.5 MG,) 1 MG/3DAYS Place 1 patch (1.5 mg total) onto the skin every 3 (three) days. 4 patch 0  . sertraline (ZOLOFT) 100 MG tablet Take 1 tablet (100 mg total) by mouth daily. (Patient taking differently: Take 100 mg by mouth at bedtime.) 90 tablet 1  . Suvorexant (BELSOMRA) 10 MG TABS Take 1 tablet by mouth at bedtime as needed. 30 tablet 0  . amLODipine (NORVASC) 5 MG tablet Take 1 tablet (5 mg total) by mouth daily. 90 tablet 1  . isosorbide mononitrate (IMDUR) 60 MG 24 hr tablet Take 1.5 tablets (90 mg total) by mouth daily. 45 tablet 11   No current facility-administered medications for this visit.   Allergies: Allergies  Allergen Reactions  . Bupropion Nausea Only and Other (See Comments)    Nausea and headaches   . Sulfa Antibiotics Other (See Comments)    From childhood- reaction not recalled  . Prednisone Anxiety and Other (See Comments)    "Made me not feel like myself, altered"   I reviewed her past medical history, social history, family history, and environmental history and no significant changes have been reported from her previous visit.  Review of Systems  Constitutional: Negative for appetite change, chills, fever and unexpected weight change.  HENT: Positive for congestion, postnasal drip, rhinorrhea and sneezing.   Eyes: Positive for itching.  Respiratory: Negative for cough, chest tightness, shortness of breath and wheezing.   Cardiovascular: Negative for chest pain.  Gastrointestinal: Negative for abdominal pain.  Genitourinary: Negative for difficulty urinating.  Skin: Negative for rash.  Allergic/Immunologic: Positive for environmental allergies.  Neurological: Positive for headaches.   Objective: BP 130/62 (BP Location: Right Arm, Patient Position: Sitting, Cuff Size: Normal)   Pulse 76   Temp (!) 96.4 F (35.8 C) (Temporal)   Resp 17   Ht 5' 4.37" (1.635 m)   Wt 205 lb 12.8 oz (93.4 kg)   SpO2 96%   BMI 34.92 kg/m  Body mass index  is 34.92 kg/m. Physical Exam Vitals and nursing note reviewed.  Constitutional:      Appearance: Normal appearance. She is well-developed.  HENT:     Head: Normocephalic and atraumatic.     Right Ear: Tympanic membrane and external ear normal.     Left Ear: Tympanic membrane and external ear normal.     Nose: Nose normal.     Mouth/Throat:     Mouth: Mucous membranes are moist.     Pharynx: Oropharynx is clear.  Eyes:     Conjunctiva/sclera: Conjunctivae normal.  Cardiovascular:     Rate and Rhythm: Normal rate and regular rhythm.     Heart sounds: Normal heart sounds. No murmur heard. No friction rub. No gallop.   Pulmonary:     Effort: Pulmonary effort is normal.     Breath sounds: Normal breath sounds. No wheezing, rhonchi or rales.  Musculoskeletal:     Cervical back: Neck supple.  Skin:  General: Skin is warm.     Findings: No rash.  Neurological:     Mental Status: She is alert and oriented to person, place, and time.  Psychiatric:        Behavior: Behavior normal.    Previous notes and tests were reviewed. The plan was reviewed with the patient/family, and all questions/concerned were addressed.  It was my pleasure to see Kristie Owens today and participate in her care. Please feel free to contact me with any questions or concerns.  Sincerely,  Wyline Mood, DO Allergy & Immunology  [facility] office: [phone] [facility] office: [phone]

## 2020-03-31 ENCOUNTER — Other Ambulatory Visit: Payer: Self-pay

## 2020-03-31 ENCOUNTER — Encounter: Payer: Self-pay | Admitting: Allergy

## 2020-03-31 ENCOUNTER — Ambulatory Visit (INDEPENDENT_AMBULATORY_CARE_PROVIDER_SITE_OTHER): Payer: Managed Care, Other (non HMO) | Admitting: Allergy

## 2020-03-31 VITALS — BP 130/62 | HR 76 | Temp 96.4°F | Resp 17 | Ht 64.37 in | Wt 205.8 lb

## 2020-03-31 DIAGNOSIS — J309 Allergic rhinitis, unspecified: Secondary | ICD-10-CM

## 2020-03-31 DIAGNOSIS — H1013 Acute atopic conjunctivitis, bilateral: Secondary | ICD-10-CM | POA: Diagnosis not present

## 2020-03-31 DIAGNOSIS — J302 Other seasonal allergic rhinitis: Secondary | ICD-10-CM | POA: Diagnosis not present

## 2020-03-31 DIAGNOSIS — J3089 Other allergic rhinitis: Secondary | ICD-10-CM

## 2020-03-31 DIAGNOSIS — H1010 Acute atopic conjunctivitis, unspecified eye: Secondary | ICD-10-CM | POA: Insufficient documentation

## 2020-03-31 NOTE — Assessment & Plan Note (Addendum)
Past history - Perennial rhinoconjunctivitis symptoms for many years but worse the last 2.  Patient was on allergy immunotherapy for 5 years in the 76s with good benefit.  Status post sinus surgery in the 1990s.  Possibly PCP saw polyps 1 year ago. 2021 skin testing showed: Positive to grass, tree, weed pollen, ragweed, cockroach and dust mites. Interim history - Started AIT on 03/01/2020 (Dm- Cr & G-Rw-W-T). No polyps noted on today's exam.   Continue environmental control measures as below. May use dymista (fluticasone + azelastine nasal spray combination) 1 spray per nostril twice a day.  Nasal saline spray (i.e., Simply Saline) or nasal saline lavage (i.e., NeilMed) is recommended as needed and prior to medicated nasal sprays.  Continue Singulair (montelukast) 10mg  daily at night.  May use olopatadine eye drops 0.2% once a day as needed for itchy/watery eyes.  Continue allergy injections - given today.  You may take an antihistamine in the morning if needed - such as allegra, Claritin, or zyrtec.  She is already taking hydroxyzine at night as a sleeping aid.

## 2020-03-31 NOTE — Patient Instructions (Addendum)
Environmental allergies  Past skin testing showed: Positive to grass, tree, weed pollen, ragweed, cockroach and dust mites.  Continue environmental control measures as below. May use dymista (fluticasone + azelastine nasal spray combination) 1 spray per nostril twice a day.  Nasal saline spray (i.e., Simply Saline) or nasal saline lavage (i.e., NeilMed) is recommended as needed and prior to medicated nasal sprays.  Continue Singulair (montelukast) 10mg  daily at night.  May use olopatadine eye drops 0.2% once a day as needed for itchy/watery eyes.  Continue allergy injections - given today.  You may take an antihistamine in the morning if needed - such as allegra, Claritin, or zyrtec.    Follow up in 6 months or sooner if needed.   Reducing Pollen Exposure . Pollen seasons: trees (spring), grass (summer) and ragweed/weeds (fall). 06[birth year]-1980 Keep windows closed in your home and car to lower pollen exposure.  [name] air conditioning in the bedroom and throughout the house if possible.  . Avoid going out in dry windy days - especially early morning. . Pollen counts are highest between 5 - 10 AM and on dry, hot and windy days.  . Save outside activities for late afternoon or after a heavy rain, when pollen levels are lower.  . Avoid mowing of grass if you have grass pollen allergy. Lilian Kapur Be aware that pollen can also be transported indoors on people and pets.  . Dry your clothes in an automatic dryer rather than hanging them outside where they might collect pollen.  . Rinse hair and eyes before bedtime.  Control of House Dust Mite Allergen . Dust mite allergens are a common trigger of allergy and asthma symptoms. While they can be found throughout the house, these microscopic creatures thrive in warm, humid environments such as bedding, upholstered furniture and carpeting. . Because so much time is spent in the bedroom, it is essential to reduce mite levels there.  . Encase pillows, mattresses,  and box springs in special allergen-proof fabric covers or airtight, zippered plastic covers.  . Bedding should be washed weekly in hot water (130 F) and dried in a hot dryer. Allergen-proof covers are available for comforters and pillows that can't be regularly washed.  [name] the allergy-proof covers every few months. Minimize clutter in the bedroom. Keep pets out of the bedroom.  Reyes Ivan Keep humidity less than 50% by using a dehumidifier or air conditioning. You can buy a humidity measuring device called a hygrometer to monitor this.  . If possible, replace carpets with hardwood, linoleum, or washable area rugs. If that's not possible, vacuum frequently with a vacuum that has a HEPA filter. . Remove all upholstered furniture and non-washable window drapes from the bedroom. . Remove all non-washable stuffed toys from the bedroom.  Wash stuffed toys weekly. Cockroach Allergen Avoidance Cockroaches are often found in the homes of densely populated urban areas, schools or commercial buildings, but these creatures can lurk almost anywhere. This does not mean that you have a dirty house or living area. . Block all areas where roaches can enter the home. This includes crevices, wall cracks and windows.  . Cockroaches need water to survive, so fix and seal all leaky faucets and pipes. Have an exterminator go through the house when your family and pets are gone to eliminate any remaining roaches. [name] Keep food in lidded containers and put pet food dishes away after your pets are done eating. Vacuum and sweep the floor after meals, and take out garbage and recyclables.  Use lidded garbage containers in the kitchen. Wash dishes immediately after use and clean under stoves, refrigerators or toasters where crumbs can accumulate. Wipe off the stove and other kitchen surfaces and cupboards regularly.

## 2020-04-08 ENCOUNTER — Other Ambulatory Visit: Payer: Managed Care, Other (non HMO)

## 2020-04-11 ENCOUNTER — Other Ambulatory Visit: Payer: Managed Care, Other (non HMO)

## 2020-04-11 ENCOUNTER — Other Ambulatory Visit: Payer: Self-pay

## 2020-04-11 DIAGNOSIS — Z79899 Other long term (current) drug therapy: Secondary | ICD-10-CM

## 2020-04-11 DIAGNOSIS — E782 Mixed hyperlipidemia: Secondary | ICD-10-CM

## 2020-04-11 LAB — ALT: ALT: 14 IU/L (ref 0–32)

## 2020-04-11 LAB — LIPID PANEL
Chol/HDL Ratio: 3.8 ratio (ref 0.0–4.4)
Cholesterol, Total: 109 mg/dL (ref 100–199)
HDL: 29 mg/dL — ABNORMAL LOW (ref >39)
LDL Chol Calc (NIH): 56 mg/dL (ref 0–99)
Triglycerides: 138 mg/dL (ref 0–149)
VLDL Cholesterol Cal: 24 mg/dL (ref 5–40)

## 2020-04-12 ENCOUNTER — Ambulatory Visit (INDEPENDENT_AMBULATORY_CARE_PROVIDER_SITE_OTHER): Payer: Managed Care, Other (non HMO)

## 2020-04-12 DIAGNOSIS — J309 Allergic rhinitis, unspecified: Secondary | ICD-10-CM

## 2020-04-13 ENCOUNTER — Encounter: Payer: Self-pay | Admitting: Family Medicine

## 2020-04-13 ENCOUNTER — Telehealth (INDEPENDENT_AMBULATORY_CARE_PROVIDER_SITE_OTHER): Payer: Managed Care, Other (non HMO) | Admitting: Family Medicine

## 2020-04-13 DIAGNOSIS — K649 Unspecified hemorrhoids: Secondary | ICD-10-CM | POA: Insufficient documentation

## 2020-04-13 DIAGNOSIS — A09 Infectious gastroenteritis and colitis, unspecified: Secondary | ICD-10-CM | POA: Insufficient documentation

## 2020-04-13 HISTORY — DX: Unspecified hemorrhoids: K64.9

## 2020-04-13 MED ORDER — DICYCLOMINE HCL 10 MG PO CAPS: 10.0000 mg | ORAL_CAPSULE | Freq: Three times a day (TID) | ORAL | 0 refills | Status: DC

## 2020-04-13 MED ORDER — AZITHROMYCIN 500 MG PO TABS: 500.0000 mg | ORAL_TABLET | Freq: Every day | ORAL | 0 refills | Status: DC

## 2020-04-13 MED ORDER — HYDROCORTISONE 1 % EX CREA: 1.0000 "application " | TOPICAL_CREAM | Freq: Two times a day (BID) | CUTANEOUS | 0 refills | Status: DC

## 2020-04-13 NOTE — Progress Notes (Signed)
VIRTUAL VISIT VIA VIDEO  I connected with [name] on 04/13/20 at  2:30 PM EDT by elemedicine application and verified that I am speaking with the correct person using two identifiers. Location patient: Home Location provider: [facility], Office Persons participating in the virtual visit: Patient, Dr. Claiborne Billings and Paulina Fusi, CMA  I discussed the limitations of evaluation and management by telemedicine and the availability of in person appointments. The patient expressed understanding and agreed to proceed.   SUBJECTIVE Chief Complaint  Patient presents with  . Emesis    Pt c/o n/v/d, fever, complete abd pain described as a dull ache x 1 week; pt was still on cruise at the time; neg covid rapid test on 3/3/221 with cruise; home test neg on friday    HPI: Kristie Owens is a 65 y.o. presents today to discuss her nausea, vomiting, diarrhea and fever that started on the last day of her cruise on March 9.  She states that first night she had diarrhea all through the night.  She continued to have diarrhea approximately every 2 hours up until 2 days ago she took Imodium.  She states she feels very fatigued but is tolerating p.o. and no longer having nausea or vomit.  She states no one in her family that was traveling with her became ill.  She denies any blood per rectum/stools but does admit to having a tender hemorrhoid secondary to all the diarrhea.  Patient reports she has lost approximately 10 pounds since diarrhea sudden.  ROS: See pertinent positives and negatives per HPI.  Patient Active Problem List   Diagnosis Date Noted  . Seasonal and perennial allergic rhinoconjunctivitis 03/31/2020  . Hypertensive heart disease 03/23/2020  . Obstructive sleep apnea syndrome 03/23/2020  . Recurrent major depression (HCC) 03/23/2020  . Tremor 03/23/2020  . Type 2 diabetes mellitus without complication (HCC) 03/23/2020  . Motion sickness 03/23/2020  . Allergic conjunctivitis of both  eyes 01/28/2020  . Arthritis 07/27/2019  . Other allergic rhinitis 07/27/2019  . Insomnia 07/27/2019  . Morbid obesity (HCC) 06/11/2019  . Prediabetes   . Transient neurologic deficit 07/02/2018  . IBS (irritable bowel syndrome) 04/08/2016  . 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 Palpitations 12/15/2013  . Hyperlipidemia 12/15/2013  . Essential hypertension 12/15/2013  . Coronary atherosclerosis of native coronary artery 01/16/2011  . Intermediate coronary syndrome (HCC) 01/16/2011    Social History   Tobacco Use  . Smoking status: Never Smoker  . Smokeless tobacco: Never Used  Substance Use Topics  . Alcohol use: Not Currently    Current Outpatient Medications:  .  amLODipine (NORVASC) 5 MG tablet, Take 1 tablet (5 mg total) by mouth daily., Disp: 90 tablet, Rfl: 1 .  aspirin EC 81 MG tablet, Take 81 mg by mouth daily., Disp: , Rfl:  .  Azelastine-Fluticasone 137-50 MCG/ACT SUSP, 1 spray per nostril BID, Disp: 23 g, Rfl: 11 .  azithromycin (ZITHROMAX) 500 MG tablet, Take 1 tablet (500 mg total) by mouth daily., Disp: 3 tablet, Rfl: 0 .  carvedilol (COREG) 6.25 MG tablet, Take 1 tablet (6.25 mg total) by mouth 2 (two) times daily with a meal., Disp: 180 tablet, Rfl: 1 .  dicyclomine (BENTYL) 10 MG capsule, Take 1 capsule (10 mg total) by mouth 4 (four) times daily -  before meals and at bedtime., Disp: 60 capsule, Rfl: 0 .  EPINEPHrine 0.3 mg/0.3 mL IJ SOAJ injection, Inject 0.3 mg into the muscle  as needed for anaphylaxis., Disp: 0.3 mL, Rfl: 2 .  hydrocortisone cream 1 %, Apply 1 application topically 2 (two) times daily. To hemorrhoid., Disp: 30 g, Rfl: 0 .  hydrOXYzine (ATARAX/VISTARIL) 10 MG tablet, Take 1-5 tablets (10-50 mg total) by mouth at bedtime as needed., Disp: 90 tablet, Rfl: 0 .  isosorbide mononitrate (IMDUR) 60 MG 24 hr tablet, Take 1.5 tablets (90 mg total) by mouth daily., Disp: 45 tablet, Rfl: 11 .  lisinopril-hydrochlorothiazide  (ZESTORETIC) 20-12.5 MG tablet, Take 1 tablet by mouth daily., Disp: 90 tablet, Rfl: 1 .  meloxicam (MOBIC) 15 MG tablet, Take 1 tablet (15 mg total) by mouth daily., Disp: 90 tablet, Rfl: 1 .  metFORMIN (GLUCOPHAGE) 500 MG tablet, Take 1 tablet (500 mg total) by mouth daily with breakfast., Disp: 90 tablet, Rfl: 1 .  montelukast (SINGULAIR) 10 MG tablet, Take 1 tablet (10 mg total) by mouth at bedtime., Disp: 90 tablet, Rfl: 1 .  nitroGLYCERIN (NITROSTAT) 0.4 MG SL tablet, Place 1 tablet (0.4 mg total) under the tongue every 5 (five) minutes as needed for chest pain., Disp: 25 tablet, Rfl: 3 .  Olopatadine HCl (PATADAY OP), Place 1 drop into both eyes daily as needed (for itching or dryness)., Disp: , Rfl:  .  omeprazole (PRILOSEC OTC) 20 MG tablet, Take 20 mg by mouth daily as needed (acid reflux)., Disp: , Rfl:  .  rosuvastatin (CRESTOR) 20 MG tablet, Take 1 tablet (20 mg total) by mouth daily., Disp: 90 tablet, Rfl: 3 .  sertraline (ZOLOFT) 100 MG tablet, Take 1 tablet (100 mg total) by mouth daily. (Patient taking differently: Take 100 mg by mouth at bedtime.), Disp: 90 tablet, Rfl: 1 .  scopolamine (TRANSDERM-SCOP, 1.5 MG,) 1 MG/3DAYS, Place 1 patch (1.5 mg total) onto the skin every 3 (three) days., Disp: 4 patch, Rfl: 0 .  Suvorexant (BELSOMRA) 10 MG TABS, Take 1 tablet by mouth at bedtime as needed. (Patient not taking: Reported on 04/13/2020), Disp: 30 tablet, Rfl: 0  Allergies  Allergen Reactions  . Bupropion Nausea Only and Other (See Comments)    Nausea and headaches   . Sulfa Antibiotics Other (See Comments)    From childhood- reaction not recalled  . Prednisone Anxiety and Other (See Comments)    "Made me not feel like myself, altered"    OBJECTIVE: There were no vitals taken for this visit. Gen: No acute distress. Nontoxic in appearance.  HENT: AT. [place].  MMM.  Eyes:Pupils Equal Round Reactive to light, Extraocular movements intact,  Conjunctiva without redness, discharge or  icterus. Chest: Cough not present Skin: No rashes, purpura or petechiae.  Neuro:  Normal gait. Alert. Oriented x3  Psych: Normal affect and demeanor. Normal speech. Normal thought content and judgment.  ASSESSMENT AND PLAN: TESHA ARCHAMBEAU is a 65 y.o. female present for  Traveler's diarrhea/hemorrhoid Discussed norovirus versus traveler's diarrhea/foodborne illnesses with her today. No one in her family became ill with her. Elected to treat with azithromycin 500 mg daily x3 days Start probiotic twice daily Avoid dairy products for now Bentyl prior to meals and before bed Proctocort cream for hemorrhoid Hydrate at least 100 ounces or more daily of both Gatorade 2 and water. Patient encouraged to follow-up in 1 week, sooner if worsening, if any symptoms remain.  At that time would need to check electrolytes and stool   Felix Pacini, DO 04/13/2020   Return in about 1 week (around 04/20/2020), or if symptoms worsen or fail to improve.  No orders of the defined types were placed in this encounter.  Meds ordered this encounter  Medications  . dicyclomine (BENTYL) 10 MG capsule    Sig: Take 1 capsule (10 mg total) by mouth 4 (four) times daily -  before meals and at bedtime.    Dispense:  60 capsule    Refill:  0  . azithromycin (ZITHROMAX) 500 MG tablet    Sig: Take 1 tablet (500 mg total) by mouth daily.    Dispense:  3 tablet    Refill:  0  . hydrocortisone cream 1 %    Sig: Apply 1 application topically 2 (two) times daily. To hemorrhoid.    Dispense:  30 g    Refill:  0   Referral Orders  No referral(s) requested today

## 2020-04-19 ENCOUNTER — Other Ambulatory Visit: Payer: Self-pay

## 2020-04-19 ENCOUNTER — Ambulatory Visit (INDEPENDENT_AMBULATORY_CARE_PROVIDER_SITE_OTHER): Payer: Managed Care, Other (non HMO)

## 2020-04-19 DIAGNOSIS — J309 Allergic rhinitis, unspecified: Secondary | ICD-10-CM

## 2020-04-20 ENCOUNTER — Telehealth: Payer: Managed Care, Other (non HMO) | Admitting: Family Medicine

## 2020-04-21 ENCOUNTER — Other Ambulatory Visit: Payer: Self-pay | Admitting: Family Medicine

## 2020-04-26 ENCOUNTER — Other Ambulatory Visit: Payer: Self-pay

## 2020-04-26 ENCOUNTER — Ambulatory Visit (INDEPENDENT_AMBULATORY_CARE_PROVIDER_SITE_OTHER): Payer: Managed Care, Other (non HMO)

## 2020-04-26 DIAGNOSIS — J309 Allergic rhinitis, unspecified: Secondary | ICD-10-CM | POA: Diagnosis not present

## 2020-04-29 ENCOUNTER — Encounter: Payer: Self-pay | Admitting: Family Medicine

## 2020-04-29 ENCOUNTER — Other Ambulatory Visit: Payer: Self-pay

## 2020-04-29 ENCOUNTER — Ambulatory Visit (INDEPENDENT_AMBULATORY_CARE_PROVIDER_SITE_OTHER): Payer: Managed Care, Other (non HMO) | Admitting: Family Medicine

## 2020-04-29 VITALS — BP 122/68 | HR 85 | Temp 98.7°F | Ht 64.25 in | Wt 198.0 lb

## 2020-04-29 DIAGNOSIS — K529 Noninfective gastroenteritis and colitis, unspecified: Secondary | ICD-10-CM | POA: Diagnosis not present

## 2020-04-29 MED ORDER — LEVOFLOXACIN 500 MG PO TABS: 500.0000 mg | ORAL_TABLET | Freq: Every day | ORAL | 0 refills | Status: DC

## 2020-04-29 MED ORDER — METRONIDAZOLE 500 MG PO TABS: 500.0000 mg | ORAL_TABLET | Freq: Three times a day (TID) | ORAL | 0 refills | Status: DC

## 2020-04-29 NOTE — Patient Instructions (Signed)
Start Levaquin one tab daily until completed Start Flagyl every 8 hours for 10 days.  If symptoms still present in 1 week> completed stool kit and call in .   Giardiasis  Giardiasis is an infection of the small intestine with the parasite Giardia. The parasite is often found in unclean (contaminated) water or areas with poor sanitation. It usually takes 1-3 weeks after swallowing the parasite to become sick. The illness usually lasts 2-6 weeks, but it may last longer. What are the causes? This condition is caused by swallowing the parasite. This can happen if you:  Drink contaminated water or eat ice that was made with contaminated water.  Eat contaminated food. This can include raw food that was washed with contaminated water.  Come into contact with the stool (feces) of a person who is infected and then passing the parasite from your hands to your mouth. This can happen after coming in contact with a surface that contains feces, such as bathroom handles or diaper changing tables.  Swallow contaminated water when swimming in lakes, ponds, or rivers. What increases the risk? You are more likely to develop this condition if you:  Do not have access to clean water.  Are the parent of a young child.  Work at a daycare or long-term care facility.  Live with or care for someone with giardiasis.  Frequently camp or hike and drink unclean water.  Travel to areas that to do not have a clean water supply.  Have anal sex without a condom with someone who is infected. You may also be more likely to develop this condition if you have certain digestive or gastric disorders. What are the signs or symptoms? Symptoms of this condition include:  Bad-smelling and watery diarrhea.  Nausea.  Stomach cramps and pain.  Gas or bloating.  Greasy feces that float.  Fatigue.  Fever. Severe symptoms of this condition include:  Weight loss.  Dehydration. Signs of dehydration can  include: ? Dry mouth and sticky tongue. ? Headaches. ? Dizziness. ? Irritability. ? Dry skin. ? Weakness. ? Dark yellow urine or a decrease in urination. ? Constipation. Some people do not develop symptoms, but they can still infect other people. How is this diagnosed? This condition is diagnosed based on:  Your medical history.  Your symptoms.  A physical exam.  Your travel history.  Tests, including: ? A stool sample test. You may need to give several stool samples for a few days. ? Blood tests. How is this treated? This condition is treated with antibiotic medicines or medicines that kill the parasite (antiparasitics). If you are pregnant, you may need to take different medicines. For mild symptoms, you may not need medicine. It is important to stay hydrated. It is important to discuss treatment options with your health care provider. Follow these instructions at home: Medicines  Take over-the-counter and prescription medicines only as told by your health care provider.  If you were prescribed an antibiotic medicine, take it as told by your health care provider. Do not stop taking the antibiotic even if your condition improves. This is important. Eating and drinking  Drink enough fluid to keep your urine pale yellow. Watch for any symptoms of dehydration.  Follow your health care provider's instructions about what you can or cannot eat or drink, such as dairy products. General instructions  Always wash your hands after going to the bathroom or changing diapers, and before preparing food. Use soap and water when washing your hands.  Children with symptoms should not go to daycare or school until symptoms have gone away.  Do not swimin a public swimming pool, hot tub, or lake until at least a week after symptoms have gone away.  Keep all follow-up visits as told by your health care provider. This is important.   How is this prevented?  Boil your water or drink bottled  water in areas of giardiasis contamination.  Do not eat ice that was made from an unclean water supply. Do not drink beverages with ice that was made from an unclean water supply.  Do not swallow water while swimming in pools, hot tubs, ponds, lakes, or the ocean.  Use safe, clean water to wash all foods.  Avoid eating raw or undercooked foods from areas with unclean water supplies.  Always wash your hands after going to the bathroom, changing diapers, or gardening, and before preparing food. Use soap and water when washing your hands.  Practice safe sex and use a condom if you have anal sex.  Clean and disinfect areas in the house that may have been contaminated with feces from an infected person. These include hard surfaces, furniture, bedding, and toys.   Contact a health care provider if:  Your symptoms get worse.  Your symptoms come back after you finish treatment.  You have symptoms of dehydration, including: ? Dry mouth and sticky tongue. ? Headaches. ? Dizziness. ? Irritability. ? Dry skin. ? Weakness. ? Dark yellow urine or a decrease in urination. ? Constipation.  You have a fever.  You keep losing weight after treatment.  You have joint pain. Get help right away if:  You have blood in your feces.  You have symptoms of severe dehydration, including: ? Rapid heartbeat. ? Sunken eyes. ? Inability to sweat or produce tears. ? No urination for more than 8 hours. ? Extreme thirst. ? Low blood pressure. ? Confusion. ? Fever. Summary  Giardiasis is an infection of the small intestine with the parasite Giardia.  This parasite is often found in unclean (contaminated) water or areas with poor sanitation.  You can develop this condition after swallowing the parasite.  This condition is treated with antibiotic medicines or medicines that kill the parasite (antiparasitics).  Handwashing is an important part of preventing giardiasis. Always wash your hands after  going to the bathroom, changing diapers, or gardening, and before preparing food. Use soap and water when washing your hands. This information is not intended to replace advice given to you by your health care provider. Make sure you discuss any questions you have with your health care provider. Document Revised: 02/25/2018 Document Reviewed: 11/20/2017 Elsevier Patient Education  2021 [employer].

## 2020-04-29 NOTE — Progress Notes (Signed)
gan

## 2020-04-29 NOTE — Progress Notes (Signed)
VIRTUAL VISIT VIA VIDEO  I connected with Kristie Owens on 04/29/20 at  3:30 PM EDT by elemedicine application and verified that I am speaking with the correct person using two identifiers. Location patient: Home Location provider: [facility], Office Persons participating in the virtual visit: Patient, Dr. Raoul Pitch and Samul Dada, CMA  I discussed the limitations of evaluation and management by telemedicine and the availability of in person appointments. The patient expressed understanding and agreed to proceed.   SUBJECTIVE Chief Complaint  Patient presents with  . Diarrhea    Pt c/o watery stools with yellow color reoccurring Tuesday 04/26/20;    HPI: Kristie Owens is a 65 y.o. presents today to discuss her recurrent diarrhea.  Patient initially seen 04/13/2020 for diarrheal infection after traveling to foreign country.  She was provided with azithromycin 500 mg x 3, Bentyl and encouraged to start probiotic.  She states that regimen had worked well until 04/26/2020 her diarrhea returned.  She states it is watery diarrhea yellow in color like "baby poop ".  She has lost 7 pounds since her last in-person visit.  She is hydrating with water and Gatorade.  She is tolerating p.o. but she is extremely nauseous and bloated.    Prior note: her nausea, vomiting, diarrhea and fever that started on the last day of her cruise on March 9.  She states that first night she had diarrhea all through the night.  She continued to have diarrhea approximately every 2 hours up until 2 days ago she took Imodium.  She states she feels very fatigued but is tolerating p.o. and no longer having nausea or vomit.  She states no one in her family that was traveling with her became ill.  She denies any blood per rectum/stools but does admit to having a tender hemorrhoid secondary to all the diarrhea.  Patient reports she has lost approximately 10 pounds since diarrhea sudden.  ROS: See pertinent positives and  negatives per HPI.  Patient Active Problem List   Diagnosis Date Noted  . Hemorrhoids 04/13/2020  . 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 Seasonal and perennial allergic rhinoconjunctivitis 03/31/2020  . Hypertensive heart disease 03/23/2020  . Obstructive sleep apnea syndrome 03/23/2020  . Recurrent major depression ([place]) 03/23/2020  . Tremor 03/23/2020  . Type 2 diabetes mellitus without complication ([place]) 31/59/4585  . Motion sickness 03/23/2020  . Allergic conjunctivitis of both eyes 01/28/2020  . Arthritis 07/27/2019  . Other allergic rhinitis 07/27/2019  . Insomnia 07/27/2019  . Morbid obesity ([place]) 06/11/2019  . Prediabetes   . Transient neurologic deficit 07/02/2018  . IBS (irritable bowel syndrome) 04/08/2016  . 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 Palpitations 12/15/2013  . Hyperlipidemia 12/15/2013  . Essential hypertension 12/15/2013  . Coronary atherosclerosis of native coronary artery 01/16/2011  . Intermediate coronary syndrome ([place]) 01/16/2011    Social History   Tobacco Use  . Smoking status: Never Smoker  . Smokeless tobacco: Never Used  Substance Use Topics  . Alcohol use: Not Currently    Current Outpatient Medications:  .  amLODipine (NORVASC) 5 MG tablet, Take 1 tablet (5 mg total) by mouth daily., Disp: 90 tablet, Rfl: 1 .  aspirin EC 81 MG tablet, Take 81 mg by mouth daily., Disp: , Rfl:  .  Azelastine-Fluticasone 137-50 MCG/ACT SUSP, 1 spray per nostril BID, Disp: 23 g, Rfl: 11 .  azithromycin (ZITHROMAX) 500 MG tablet, Take 1 tablet (500 mg total) by mouth daily.,  Disp: 3 tablet, Rfl: 0 .  carvedilol (COREG) 6.25 MG tablet, Take 1 tablet (6.25 mg total) by mouth 2 (two) times daily with a meal., Disp: 180 tablet, Rfl: 1 .  dicyclomine (BENTYL) 10 MG capsule, Take 1 capsule (10 mg total) by mouth 4 (four) times daily -  before meals and at bedtime., Disp: 60 capsule, Rfl: 0 .  EPINEPHrine 0.3 mg/0.3 mL IJ SOAJ injection,  Inject 0.3 mg into the muscle as needed for anaphylaxis., Disp: 0.3 mL, Rfl: 2 .  hydrocortisone cream 1 %, Apply 1 application topically 2 (two) times daily. To hemorrhoid., Disp: 30 g, Rfl: 0 .  Hydrocortisone, Perianal, 1 % CREA, Apply 1 application topically 2 (two) times daily., Disp: , Rfl:  .  hydrOXYzine (ATARAX/VISTARIL) 10 MG tablet, Take 1-5 tablets (10-50 mg total) by mouth at bedtime as needed., Disp: 90 tablet, Rfl: 0 .  levofloxacin (LEVAQUIN) 500 MG tablet, Take 1 tablet (500 mg total) by mouth daily., Disp: 5 tablet, Rfl: 0 .  lisinopril-hydrochlorothiazide (ZESTORETIC) 20-12.5 MG tablet, Take 1 tablet by mouth daily., Disp: 90 tablet, Rfl: 1 .  meloxicam (MOBIC) 15 MG tablet, Take 1 tablet (15 mg total) by mouth daily., Disp: 90 tablet, Rfl: 1 .  metFORMIN (GLUCOPHAGE) 500 MG tablet, Take 1 tablet (500 mg total) by mouth daily with breakfast., Disp: 90 tablet, Rfl: 1 .  metroNIDAZOLE (FLAGYL) 500 MG tablet, Take 1 tablet (500 mg total) by mouth 3 (three) times daily., Disp: 30 tablet, Rfl: 0 .  montelukast (SINGULAIR) 10 MG tablet, Take 1 tablet (10 mg total) by mouth at bedtime., Disp: 90 tablet, Rfl: 1 .  nitroGLYCERIN (NITROSTAT) 0.4 MG SL tablet, Place 1 tablet (0.4 mg total) under the tongue every 5 (five) minutes as needed for chest pain., Disp: 25 tablet, Rfl: 3 .  Olopatadine HCl (PATADAY OP), Place 1 drop into both eyes daily as needed (for itching or dryness)., Disp: , Rfl:  .  omeprazole (PRILOSEC OTC) 20 MG tablet, Take 20 mg by mouth daily as needed (acid reflux)., Disp: , Rfl:  .  rosuvastatin (CRESTOR) 20 MG tablet, Take 1 tablet (20 mg total) by mouth daily., Disp: 90 tablet, Rfl: 3 .  scopolamine (TRANSDERM-SCOP, 1.5 MG,) 1 MG/3DAYS, Place 1 patch (1.5 mg total) onto the skin every 3 (three) days., Disp: 4 patch, Rfl: 0 .  sertraline (ZOLOFT) 100 MG tablet, Take 1 tablet (100 mg total) by mouth daily. (Patient taking differently: Take 100 mg by mouth at bedtime.),  Disp: 90 tablet, Rfl: 1 .  Suvorexant (BELSOMRA) 10 MG TABS, Take 1 tablet by mouth at bedtime as needed., Disp: 30 tablet, Rfl: 0 .  isosorbide mononitrate (IMDUR) 60 MG 24 hr tablet, Take 1.5 tablets (90 mg total) by mouth daily., Disp: 45 tablet, Rfl: 11  Allergies  Allergen Reactions  . Bupropion Nausea Only and Other (See Comments)    Nausea and headaches   . Sulfa Antibiotics Other (See Comments)    From childhood- reaction not recalled  . Prednisone Anxiety and Other (See Comments)    "Made me not feel like myself, altered"    OBJECTIVE: BP 122/68   Pulse 85   Temp 98.7 F (37.1 C) (Oral)   Ht 5' 4.25" (1.632 m)   Wt 198 lb (89.8 kg)   SpO2 98%   BMI 33.72 kg/m  Gen: Afebrile. No acute distress.  Nontoxic, pleasant female. HENT: AT. [place].  Moist mucous membranes Eyes:Pupils Equal Round Reactive to light, Extraocular  movements intact,  Conjunctiva without redness, discharge or icterus Abd: Soft.  Obese. NTND. BS hyperactive.  Known masses palpate Skin: No rashes, purpura or petechiae.  Neuro:Normal gait. PERLA. EOMi. Alert. Oriented x3 Psych: Normal affect, dress and demeanor. Normal speech. Normal thought content and judgment.  ASSESSMENT AND PLAN: Kristie Owens is a 65 y.o. female present for  Diarrhea Diarrhea status post traveling on a cruise.  She had some temporary relief with the use of azithromycin x3 days.  Her symptoms unfortunately have returned. Start Levaquin and Flagyl  continue probiotic twice daily  Avoid dairy products for now Bentyl prior to meals and before bed Proctocort cream for hemorrhoid Hydrate at least 100 ounces or more daily of both Gatorade 2 and water. Patient was provided with stool kit and future orders placed in the event her symptoms are not resolved in 1 week with current plan   Howard Pouch, DO 04/29/2020   Return in about 1 week (around 05/06/2020), or if symptoms worsen or fail to improve.  Orders Placed This Encounter   Procedures  . Stool Culture  . Clostridium Difficile by PCR  . Ova and parasite examination  . Gastrointestinal Pathogen Panel PCR   Meds ordered this encounter  Medications  . metroNIDAZOLE (FLAGYL) 500 MG tablet    Sig: Take 1 tablet (500 mg total) by mouth 3 (three) times daily.    Dispense:  30 tablet    Refill:  0  . levofloxacin (LEVAQUIN) 500 MG tablet    Sig: Take 1 tablet (500 mg total) by mouth daily.    Dispense:  5 tablet    Refill:  0   Referral Orders  No referral(s) requested today

## 2020-05-03 ENCOUNTER — Other Ambulatory Visit: Payer: Self-pay

## 2020-05-03 ENCOUNTER — Ambulatory Visit (INDEPENDENT_AMBULATORY_CARE_PROVIDER_SITE_OTHER): Payer: Managed Care, Other (non HMO)

## 2020-05-03 DIAGNOSIS — J309 Allergic rhinitis, unspecified: Secondary | ICD-10-CM | POA: Diagnosis not present

## 2020-05-12 ENCOUNTER — Ambulatory Visit (INDEPENDENT_AMBULATORY_CARE_PROVIDER_SITE_OTHER): Payer: Managed Care, Other (non HMO)

## 2020-05-12 ENCOUNTER — Other Ambulatory Visit: Payer: Self-pay

## 2020-05-12 DIAGNOSIS — J309 Allergic rhinitis, unspecified: Secondary | ICD-10-CM

## 2020-05-17 ENCOUNTER — Ambulatory Visit (INDEPENDENT_AMBULATORY_CARE_PROVIDER_SITE_OTHER): Payer: Managed Care, Other (non HMO)

## 2020-05-17 ENCOUNTER — Other Ambulatory Visit: Payer: Self-pay

## 2020-05-17 DIAGNOSIS — J309 Allergic rhinitis, unspecified: Secondary | ICD-10-CM

## 2020-05-24 ENCOUNTER — Ambulatory Visit (INDEPENDENT_AMBULATORY_CARE_PROVIDER_SITE_OTHER): Payer: Managed Care, Other (non HMO)

## 2020-05-24 ENCOUNTER — Other Ambulatory Visit: Payer: Self-pay

## 2020-05-24 DIAGNOSIS — J309 Allergic rhinitis, unspecified: Secondary | ICD-10-CM

## 2020-05-31 ENCOUNTER — Other Ambulatory Visit: Payer: Self-pay

## 2020-05-31 ENCOUNTER — Ambulatory Visit (INDEPENDENT_AMBULATORY_CARE_PROVIDER_SITE_OTHER): Payer: Managed Care, Other (non HMO)

## 2020-05-31 DIAGNOSIS — J309 Allergic rhinitis, unspecified: Secondary | ICD-10-CM

## 2020-06-06 ENCOUNTER — Ambulatory Visit (INDEPENDENT_AMBULATORY_CARE_PROVIDER_SITE_OTHER): Payer: Managed Care, Other (non HMO) | Admitting: Family Medicine

## 2020-06-06 ENCOUNTER — Other Ambulatory Visit: Payer: Self-pay

## 2020-06-06 ENCOUNTER — Encounter: Payer: Self-pay | Admitting: Family Medicine

## 2020-06-06 VITALS — BP 134/71 | HR 72 | Temp 98.4°F | Ht 64.0 in | Wt 201.2 lb

## 2020-06-06 DIAGNOSIS — J3089 Other allergic rhinitis: Secondary | ICD-10-CM

## 2020-06-06 DIAGNOSIS — E119 Type 2 diabetes mellitus without complications: Secondary | ICD-10-CM | POA: Diagnosis not present

## 2020-06-06 DIAGNOSIS — I25118 Atherosclerotic heart disease of native coronary artery with other forms of angina pectoris: Secondary | ICD-10-CM | POA: Diagnosis not present

## 2020-06-06 DIAGNOSIS — R7303 Prediabetes: Secondary | ICD-10-CM | POA: Diagnosis not present

## 2020-06-06 DIAGNOSIS — E7800 Pure hypercholesterolemia, unspecified: Secondary | ICD-10-CM

## 2020-06-06 DIAGNOSIS — I200 Unstable angina: Secondary | ICD-10-CM

## 2020-06-06 DIAGNOSIS — Z955 Presence of coronary angioplasty implant and graft: Secondary | ICD-10-CM | POA: Diagnosis not present

## 2020-06-06 DIAGNOSIS — I10 Essential (primary) hypertension: Secondary | ICD-10-CM

## 2020-06-06 DIAGNOSIS — F3340 Major depressive disorder, recurrent, in remission, unspecified: Secondary | ICD-10-CM

## 2020-06-06 DIAGNOSIS — M1990 Unspecified osteoarthritis, unspecified site: Secondary | ICD-10-CM

## 2020-06-06 DIAGNOSIS — F5101 Primary insomnia: Secondary | ICD-10-CM

## 2020-06-06 LAB — POCT GLYCOSYLATED HEMOGLOBIN (HGB A1C): Hemoglobin A1C: 5.7 % — AB (ref 4.0–5.6)

## 2020-06-06 MED ORDER — BELSOMRA 10 MG PO TABS: 1.0000 | ORAL_TABLET | Freq: Every evening | ORAL | 5 refills | Status: DC | PRN

## 2020-06-06 MED ORDER — AMLODIPINE BESYLATE 5 MG PO TABS: 5.0000 mg | ORAL_TABLET | Freq: Every day | ORAL | 1 refills | Status: DC

## 2020-06-06 MED ORDER — SERTRALINE HCL 100 MG PO TABS: 100.0000 mg | ORAL_TABLET | Freq: Every evening | ORAL | 1 refills | Status: DC

## 2020-06-06 MED ORDER — LISINOPRIL-HYDROCHLOROTHIAZIDE 20-12.5 MG PO TABS: 1.0000 | ORAL_TABLET | Freq: Every day | ORAL | 1 refills | Status: DC

## 2020-06-06 MED ORDER — MELOXICAM 15 MG PO TABS: 15.0000 mg | ORAL_TABLET | Freq: Every day | ORAL | 1 refills | Status: DC

## 2020-06-06 MED ORDER — CARVEDILOL 6.25 MG PO TABS: 6.2500 mg | ORAL_TABLET | Freq: Two times a day (BID) | ORAL | 1 refills | Status: DC

## 2020-06-06 MED ORDER — METFORMIN HCL 500 MG PO TABS: 500.0000 mg | ORAL_TABLET | Freq: Every day | ORAL | 1 refills | Status: DC

## 2020-06-06 MED ORDER — MONTELUKAST SODIUM 10 MG PO TABS
10.0000 mg | ORAL_TABLET | Freq: Every day | ORAL | 1 refills | Status: DC
Start: 2020-06-06 — End: 2020-11-18

## 2020-06-06 NOTE — Progress Notes (Signed)
This visit occurred during the SARS-CoV-2 public health emergency.  Safety protocols were in place, including screening questions prior to the visit, additional usage of staff PPE, and extensive cleaning of exam room while observing appropriate contact time as indicated for disinfecting solutions.    Patient ID: Kristie Owens, female  DOB: [birth year], 65 y.o.   MRN: [id number] Patient Care Team    Relationship Specialty Notifications Start End  Ma Hillock, DO PCP - General Family Medicine  06/09/19   Jerline Pain, MD PCP - Cardiology Cardiology  12/17/19   Jerline Pain, MD Consulting Physician Cardiology  06/08/19   Otelia Sergeant, OD Referring Physician   06/11/19   Ralene Muskrat Physician Assistant Chiropractic Medicine  06/11/19     Chief Complaint  Patient presents with  . Follow-up    sleep    Subjective: Kristie Owens is a 65 y.o.  Female  present for [facility] Hypertension/hyperlipidemia/coronary artery disease/TIA: Pt reports compliance with lisinopril-HCTZ 20-12.5, amlodipine 5 mg, Coreg 6.25 mg twice daily, Crestor 20 mg daily, Imdur 60 mg daily. Blood pressures ranges at home  within normal limits. Patient denies chest pain, shortness of breath, dizziness or lower extremity edema.  Pt takes a daily baby ASA. Pt is  prescribed statin.  Patient is established with Dr. Marlou Porch for cardiology.  Has a history of coronary artery disease status post heart catheterization in December 2012 and May 2018 demonstrating widely patent LAD and RCA stent, mild irregularity of the ostium of the diagonal where PTCA was felt to be high risk RF: Hypertension, hyperlipidemia, obesity, family history of heart disease  Cardiac catheterization on 06/04/16:  Widely patent proximal and distal LAD stents. 40% ostial narrowing in the first diagonal.  Patent mid right coronary stents. Proximal 40% RCA eccentric narrowing.  Circumflex is a small vessel with 40-50% first obtuse marginal.  Ramus  intermedius (or first obtuse marginal depending upon categorization) contains 40% proximal narrowing.  Normal LV function. Elevated LVEDP at 23 mmHg. EF 55%.  Depression/anxiety/insomnia: Patient has a history of depression with anxiety as well as insomnia. She reports compliance  with Zoloft 100 mg daily. Ambien, trazodone, vistaril has been tried for insomnia- still not sleeping great. She  does feel the Zoloft is working well for her.    Allergic rhinitis: Patient reports compliance with Singulair and dymista nasal spray.  She reports she still has allergy symptoms and feels they have been becoming worse over the last year.  She had been on a daily allergy medicine however she felt that made her tired.  Diabetes type 2: Patient has history of mildly elevated A1c in the diabetic range of 6.6>6.7> started metformin 500 mg qd > 5.7 today.  She has controlled it with diet and exercise. She is tolerating metformin.    Depression screen [facility] 2/9 06/06/2020 12/30/2019 07/22/2019 06/09/2019 10/30/2017  Decreased Interest 0 0 0 0 0  Down, Depressed, Hopeless 1 0 0 0 0  PHQ - 2 Score 1 0 0 0 0  Altered sleeping 3 0 0 2 -  Tired, decreased energy 2 0 0 0 -  Change in appetite 0 0 0 3 -  Feeling bad or failure about yourself  0 0 0 0 -  Trouble concentrating 0 0 0 0 -  Moving slowly or fidgety/restless 0 0 0 0 -  Suicidal thoughts 0 0 0 0 -  PHQ-9 Score 6 0 0 5 -  Difficult doing work/chores Not difficult  at all - Not difficult at all Not difficult at all -   GAD 7 : Generalized Anxiety Score 06/06/2020  Nervous, Anxious, on Edge 0  Control/stop worrying 0  Worry too much - different things 0  Trouble relaxing 1  Restless 0  Easily annoyed or irritable 0  Afraid - awful might happen 0  Total GAD 7 Score 1  Anxiety Difficulty Not difficult at all     Immunization History  Administered Date(s) Administered  . Fluad Quad(high Dose 65+) 10/19/2019  . Influenza Split 12/28/2010, 11/22/2011,  11/13/2012  . Influenza, High Dose Seasonal PF 11/21/2018  . Influenza,inj,Quad PF,6+ Mos 12/15/2013, 12/13/2016, 11/21/2017  . Influenza,inj,quad, With Preservative 09/19/2015  . Influenza-Unspecified 11/21/2018  . Moderna Sars-Covid-2 Vaccination 02/19/2019, 03/27/2019, 12/19/2019  . Pneumococcal Conjugate-13 11/21/2017  . Pneumococcal Polysaccharide-23 11/25/2018  . Tdap 08/10/2013  . Zoster Recombinat (Shingrix) 12/30/2019    Past Medical History:  Diagnosis Date  . Achilles bursitis or tendinitis 11/11/2012   right   . Allergy   . Angina at rest [facility])    Dr Marlou Porch  . CAD (coronary artery disease), native coronary artery    DES to LAD in 2 areas, one at the bifurcation of a diagonal branch and also in the mid to distal region. Her diagonal branch was also ballooned . She has a DES to RCA 10/10. Her circumflex artery mild ostial disease.  . Chicken pox   . Depression   . DM (diabetes mellitus) ([place])   . 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 Hemorrhoids 04/13/2020  . Hx of cardiovascular stress test    ETT- Myoview (2/16):  No ischemia, EF 69%, normal study  . Hyperlipidemia   . Hypertension   . Lumbar pain   . OSA on CPAP    Mild, mild hypoxia 83% AHI -11.1, O2 minimum 83%  . Peroneal tendinitis of left lower extremity 11/11/2012   Left with tear.   . Tonsillar calculus 08/12/2018   Allergies  Allergen Reactions  . Bupropion Nausea Only and Other (See Comments)    Nausea and headaches   . Sulfa Antibiotics Other (See Comments)    From childhood- reaction not recalled  . Prednisone Anxiety and Other (See Comments)    "Made me not feel like myself, altered"   Past Surgical History:  Procedure Laterality Date  . BREAST EXCISIONAL BIOPSY Right 1995  . CAROTID STENT    . CESAREAN SECTION  1983  . FOOT SURGERY    . LEFT HEART CATH AND CORONARY ANGIOGRAPHY N/A 06/04/2016   Procedure: Left Heart Cath and Coronary Angiography;  Surgeon: Belva Crome, MD;  Location: [place] [facility];   Service: Cardiovascular;  Laterality: N/A;  . LEFT HEART CATH AND CORONARY ANGIOGRAPHY N/A 12/04/2019   Procedure: LEFT HEART CATH AND CORONARY ANGIOGRAPHY;  Surgeon: Burnell Blanks, MD;  Location: [place] CV [facility];  Service: Cardiovascular;  Laterality: N/A;  . LEFT HEART CATHETERIZATION WITH CORONARY ANGIOGRAM N/A 01/16/2011   Procedure: LEFT HEART CATHETERIZATION WITH CORONARY ANGIOGRAM;  Surgeon: Candee Furbish, MD;  Location: [facility] CATH LAB;  Service: Cardiovascular;  Laterality: N/A;  . NOSE SURGERY    . SINOSCOPY     Family History  Problem Relation Age of Onset  . Hyperlipidemia Brother   . Hypertension Brother   . Intellectual disability Brother   . Kidney disease Brother   . Hypertension Mother   . Heart disease Mother   . COPD Mother   . Ulcers Mother   . Lung  cancer Father   . Heart failure Paternal Grandmother   . Heart attack Paternal Grandmother   . Hypertension Paternal Grandmother   . Hyperlipidemia Paternal Grandmother   . Heart disease Paternal Grandmother   . Heart attack Brother   . Asthma Brother   . Early death Brother 44  . Hyperlipidemia Brother   . Hypertension Brother   . Intellectual disability Brother   . Kidney disease Brother   . Heart disease Brother   . Hypertension Sister   . Diabetes Sister   . Early death Maternal Grandmother   . Tuberculosis Maternal Grandmother   . Lung cancer Maternal Grandfather   . Suicidality Maternal Grandfather   . Early death Maternal Grandfather   . Heart disease Paternal Grandfather   . Hyperlipidemia Paternal Grandfather   . Hypertension Paternal Grandfather   . Heart attack Other   . Arthritis Sister   . Depression Sister   . Miscarriages / Stillbirths Sister   . Alcohol abuse Brother   . Stroke Neg Hx    Social History   Social History Narrative   Marital status/children/pets: Married.  One child.   Education/employment: Retired.  Associates degree in college.   Safety:      -Wears a bicycle  helmet riding a bike: Yes     -smoke alarm in the home:Yes     - wears seatbelt: Yes     - Feels safe in their relationships: Yes    Allergies as of 06/06/2020      Reactions   Bupropion Nausea Only, Other (See Comments)   Nausea and headaches    Sulfa Antibiotics Other (See Comments)   From childhood- reaction not recalled   Prednisone Anxiety, Other (See Comments)   "Made me not feel like myself, altered"      Medication List       Accurate as of Jun 06, 2020  8:59 AM. If you have any questions, ask your nurse or doctor.        STOP taking these medications   azithromycin 500 MG tablet Commonly known as: Zithromax Stopped by: Howard Pouch, DO   dicyclomine 10 MG capsule Commonly known as: Bentyl Stopped by: Howard Pouch, DO   Hydrocortisone (Perianal) 1 % Crea Stopped by: Howard Pouch, DO   hydrocortisone cream 1 % Stopped by: Howard Pouch, DO   levofloxacin 500 MG tablet Commonly known as: LEVAQUIN Stopped by: Howard Pouch, DO   metroNIDAZOLE 500 MG tablet Commonly known as: FLAGYL Stopped by: Howard Pouch, DO   scopolamine 1 MG/3DAYS Commonly known as: Transderm-Scop (1.5 MG) Stopped by: Howard Pouch, DO     TAKE these medications   amLODipine 5 MG tablet Commonly known as: NORVASC Take 1 tablet (5 mg total) by mouth daily.   aspirin EC 81 MG tablet Take 81 mg by mouth daily.   Azelastine-Fluticasone 137-50 MCG/ACT Susp 1 spray per nostril BID   Belsomra 10 MG Tabs Generic drug: Suvorexant Take 1 tablet by mouth at bedtime as needed.   carvedilol 6.25 MG tablet Commonly known as: COREG Take 1 tablet (6.25 mg total) by mouth 2 (two) times daily with a meal.   EPINEPHrine 0.3 mg/0.3 mL Soaj injection Commonly known as: EPI-PEN Inject 0.3 mg into the muscle as needed for anaphylaxis.   hydrOXYzine 10 MG tablet Commonly known as: ATARAX/VISTARIL Take 1-5 tablets (10-50 mg total) by mouth at bedtime as needed.   isosorbide mononitrate 60 MG 24  hr tablet Commonly known as: IMDUR Take  1.5 tablets (90 mg total) by mouth daily.   lisinopril-hydrochlorothiazide 20-12.5 MG tablet Commonly known as: ZESTORETIC Take 1 tablet by mouth daily.   meloxicam 15 MG tablet Commonly known as: MOBIC Take 1 tablet (15 mg total) by mouth daily.   metFORMIN 500 MG tablet Commonly known as: GLUCOPHAGE Take 1 tablet (500 mg total) by mouth daily with breakfast.   montelukast 10 MG tablet Commonly known as: SINGULAIR Take 1 tablet (10 mg total) by mouth at bedtime.   nitroGLYCERIN 0.4 MG SL tablet Commonly known as: NITROSTAT Place 1 tablet (0.4 mg total) under the tongue every 5 (five) minutes as needed for chest pain.   omeprazole 20 MG tablet Commonly known as: PRILOSEC OTC Take 20 mg by mouth daily as needed (acid reflux).   PATADAY OP Place 1 drop into both eyes daily as needed (for itching or dryness).   rosuvastatin 20 MG tablet Commonly known as: CRESTOR Take 1 tablet (20 mg total) by mouth daily.   sertraline 100 MG tablet Commonly known as: ZOLOFT Take 1 tablet (100 mg total) by mouth at bedtime.       All past medical history, surgical history, allergies, family history, immunizations andmedications were updated in the EMR today and reviewed under the history and medication portions of their EMR.     No results found.   ROS: 14 pt review of systems performed and negative (unless mentioned in an HPI)  Objective: BP 134/71 (BP Location: Left Arm, Patient Position: Sitting, Cuff Size: Large)   Pulse 72   Temp 98.4 F (36.9 C) (Temporal)   Ht _0  (1.626 m)   Wt 201 lb 3.2 oz (91.3 kg)   SpO2 96%   BMI 34.54 kg/m  Gen: Afebrile. No acute distress. Nontoxic in appearance.  HENT: AT. [place]. Eyes:Pupils Equal Round Reactive to light, Extraocular movements intact,  Conjunctiva without redness, discharge or icterus. Neck/lymp/endocrine: Supple,no lymphadenopathy, no thyromegaly CV: RRR no murmur, no edema, +2/4 P  posterior tibialis pulses Chest: CTAB, no wheeze or crackles Abd: Soft. NTND. BS present.   Skin: no rashes, purpura or petechiae.  Neuro: Normal gait. PERLA. EOMi. Alert. Oriented x3 Psych: Normal affect, dress and demeanor. Normal speech. Normal thought content and judgment.  Results for orders placed or performed in visit on 06/06/20 (from the past 24 hour(s))  POCT HgB A1C     Status: Abnormal   Collection Time: 06/06/20  8:29 AM  Result Value Ref Range   Hemoglobin A1C 5.7 (A) 4.0 - 5.6 %   HbA1c POC (<> result, manual entry)     HbA1c, POC (prediabetic range)     HbA1c, POC (controlled diabetic range)      Assessment/plan: Kristie Owens is a 64 y.o. female present for [facility] osteoarthritis - stable. Some knee pain  Remains.  - Continue Mobic qd with food.  - continue  OTC voltaren gel/cream.  - discussed cherry tart extract and tumeric natural supplements and their SE potentials  - ANA, RF, CCP, ESR, CRP > WNL  Major depressive disorder, remission status unspecified, unspecified whether recurrent/insomnia Stable.  Continue  Zoloft 100 mg daily. Discontinued Ambien 2021.   DCd trazodone taper 25-75 mg nightly.> not helpful Dc vistaril> not helpful and felt bad on higher dose.  Start belsomra (did not try last time)  Diabetes type 2: Stable. Patient encouraged to continue exercise and dietary modifications.  Continue metformin 500 mg qd a1c improved today 5.7 (was 6.7) F/u 5.5 mos  Essential hypertension/hyperlipidemia/Presence of coronary angioplasty implant and graft/Atherosclerosis of native coronary artery of native heart with other form of angina pectoris (HCC)/Intermediate coronary syndrome (HCC)/ Palpitations/Transient neurologic deficit/Morbid obesity (HCC) Stable.  Continue daily exercise. Continue low-sodium/heart healthy diet. Continue  Imdur> prescribed by cardiology. continue amlodipine 5 mg daily Continue  Coreg 6.25 mg twice daily Continue   lisinopril-HCTZ 20-12.5 mg daily Continue Crestor 20 mg daily Continue baby aspirin daily  Allergic rhinitis, unspecified seasonality, unspecified trigger continue Allegra OTC  DC Flonase.  Continue Dymista. Continue Singulair established allergy and asthma in [facility] placed.  Has had allergy injections in the past.   Return in about 24 weeks (around 11/21/2020) for [place] (30 min).  Orders Placed This Encounter  Procedures  . POCT HgB A1C    Meds ordered this encounter  Medications  . amLODipine (NORVASC) 5 MG tablet    Sig: Take 1 tablet (5 mg total) by mouth daily.    Dispense:  90 tablet    Refill:  1  . carvedilol (COREG) 6.25 MG tablet    Sig: Take 1 tablet (6.25 mg total) by mouth 2 (two) times daily with a meal.    Dispense:  180 tablet    Refill:  1    pls discontinue any other rx for Carvedilol  . lisinopril-hydrochlorothiazide (ZESTORETIC) 20-12.5 MG tablet    Sig: Take 1 tablet by mouth daily.    Dispense:  90 tablet    Refill:  1  . meloxicam (MOBIC) 15 MG tablet    Sig: Take 1 tablet (15 mg total) by mouth daily.    Dispense:  90 tablet    Refill:  1  . metFORMIN (GLUCOPHAGE) 500 MG tablet    Sig: Take 1 tablet (500 mg total) by mouth daily with breakfast.    Dispense:  90 tablet    Refill:  1  . montelukast (SINGULAIR) 10 MG tablet    Sig: Take 1 tablet (10 mg total) by mouth at bedtime.    Dispense:  90 tablet    Refill:  1  . sertraline (ZOLOFT) 100 MG tablet    Sig: Take 1 tablet (100 mg total) by mouth at bedtime.    Dispense:  90 tablet    Refill:  1  . Suvorexant (BELSOMRA) 10 MG TABS    Sig: Take 1 tablet by mouth at bedtime as needed.    Dispense:  30 tablet    Refill:  5   Referral Orders  No referral(s) requested today     Electronically signed by: Howard Pouch, [place]

## 2020-06-06 NOTE — Patient Instructions (Signed)
Great to see you today.  I have refilled the medication(s) we provide.   If labs were collected, we will inform you of lab results once received either by echart message or telephone call.   - echart message- for normal results that have been seen by the patient already.   - telephone call: abnormal results or if patient has not viewed results in their echart.  

## 2020-06-07 ENCOUNTER — Ambulatory Visit (INDEPENDENT_AMBULATORY_CARE_PROVIDER_SITE_OTHER): Payer: Managed Care, Other (non HMO)

## 2020-06-07 DIAGNOSIS — J309 Allergic rhinitis, unspecified: Secondary | ICD-10-CM | POA: Diagnosis not present

## 2020-06-14 ENCOUNTER — Other Ambulatory Visit: Payer: Self-pay

## 2020-06-14 ENCOUNTER — Ambulatory Visit (INDEPENDENT_AMBULATORY_CARE_PROVIDER_SITE_OTHER): Payer: Managed Care, Other (non HMO)

## 2020-06-14 DIAGNOSIS — J309 Allergic rhinitis, unspecified: Secondary | ICD-10-CM | POA: Diagnosis not present

## 2020-06-20 ENCOUNTER — Ambulatory Visit: Payer: Managed Care, Other (non HMO) | Admitting: Family Medicine

## 2020-06-22 ENCOUNTER — Ambulatory Visit
Admission: RE | Admit: 2020-06-22 | Discharge: 2020-06-22 | Disposition: A | Discharge status: Home / Self Care | Payer: Managed Care, Other (non HMO) | Source: Ambulatory Visit | Attending: Family Medicine | Admitting: Family Medicine

## 2020-06-22 ENCOUNTER — Other Ambulatory Visit: Payer: Self-pay

## 2020-06-22 ENCOUNTER — Telehealth: Payer: Self-pay | Admitting: Family Medicine

## 2020-06-22 DIAGNOSIS — E2839 Other primary ovarian failure: Secondary | ICD-10-CM

## 2020-06-22 DIAGNOSIS — Z1231 Encounter for screening mammogram for malignant neoplasm of breast: Secondary | ICD-10-CM

## 2020-06-22 NOTE — Telephone Encounter (Signed)
Please inform patient: Her bone density resulted with mild decrease in her bone density from normal.  This is in the osteopenia range. Prescribed medications are not needed at this time, but she has had a mildly increase of fracture risk and should take precautions. -Recommendations: Osteopenia: 1.) Drink alcohol in moderation only 2.) Decrease caffeine consumption to no  More than 2.5 cups of coffee a day or nor more than 5 cups of tea a day. 3.) Exercise: weight bearing (walking counts), strength and balance training. 4.) No smoking.  5.) Sunlight/Ultraviolet light exposure 30 minutes a day/5 days a week. 6.) Vitamin D supplement [phone] [phone] a day (at least, more if told otherwise)  7.) calcium 1000-1200 mg a day-illness she has been told prior not to take calcium supplements.   We will repeat her bone density in 2 years to ensure her bone density is stabilized.

## 2020-06-22 NOTE — Telephone Encounter (Signed)
Spoke with pt regarding labs and instructions.   

## 2020-06-23 ENCOUNTER — Ambulatory Visit (INDEPENDENT_AMBULATORY_CARE_PROVIDER_SITE_OTHER): Payer: Managed Care, Other (non HMO)

## 2020-06-23 DIAGNOSIS — J309 Allergic rhinitis, unspecified: Secondary | ICD-10-CM

## 2020-06-28 ENCOUNTER — Other Ambulatory Visit: Payer: Self-pay

## 2020-06-28 ENCOUNTER — Ambulatory Visit (INDEPENDENT_AMBULATORY_CARE_PROVIDER_SITE_OTHER): Payer: Managed Care, Other (non HMO)

## 2020-06-28 DIAGNOSIS — J309 Allergic rhinitis, unspecified: Secondary | ICD-10-CM

## 2020-07-05 ENCOUNTER — Other Ambulatory Visit: Payer: Self-pay

## 2020-07-05 ENCOUNTER — Ambulatory Visit (INDEPENDENT_AMBULATORY_CARE_PROVIDER_SITE_OTHER): Payer: Managed Care, Other (non HMO)

## 2020-07-05 DIAGNOSIS — J309 Allergic rhinitis, unspecified: Secondary | ICD-10-CM

## 2020-07-12 ENCOUNTER — Ambulatory Visit (INDEPENDENT_AMBULATORY_CARE_PROVIDER_SITE_OTHER): Payer: Managed Care, Other (non HMO)

## 2020-07-12 ENCOUNTER — Other Ambulatory Visit: Payer: Self-pay

## 2020-07-12 DIAGNOSIS — J309 Allergic rhinitis, unspecified: Secondary | ICD-10-CM

## 2020-07-19 ENCOUNTER — Ambulatory Visit (INDEPENDENT_AMBULATORY_CARE_PROVIDER_SITE_OTHER): Payer: Managed Care, Other (non HMO)

## 2020-07-19 ENCOUNTER — Other Ambulatory Visit: Payer: Self-pay

## 2020-07-19 DIAGNOSIS — J309 Allergic rhinitis, unspecified: Secondary | ICD-10-CM | POA: Diagnosis not present

## 2020-07-26 ENCOUNTER — Ambulatory Visit (INDEPENDENT_AMBULATORY_CARE_PROVIDER_SITE_OTHER): Payer: Managed Care, Other (non HMO)

## 2020-07-26 ENCOUNTER — Other Ambulatory Visit: Payer: Self-pay

## 2020-07-26 DIAGNOSIS — J309 Allergic rhinitis, unspecified: Secondary | ICD-10-CM | POA: Diagnosis not present

## 2020-08-04 ENCOUNTER — Ambulatory Visit (INDEPENDENT_AMBULATORY_CARE_PROVIDER_SITE_OTHER): Payer: Managed Care, Other (non HMO)

## 2020-08-04 ENCOUNTER — Other Ambulatory Visit: Payer: Self-pay

## 2020-08-04 DIAGNOSIS — J309 Allergic rhinitis, unspecified: Secondary | ICD-10-CM

## 2020-08-11 ENCOUNTER — Other Ambulatory Visit: Payer: Self-pay

## 2020-08-11 ENCOUNTER — Ambulatory Visit (INDEPENDENT_AMBULATORY_CARE_PROVIDER_SITE_OTHER): Payer: Managed Care, Other (non HMO)

## 2020-08-11 DIAGNOSIS — J309 Allergic rhinitis, unspecified: Secondary | ICD-10-CM

## 2020-08-18 ENCOUNTER — Other Ambulatory Visit: Payer: Self-pay

## 2020-08-18 ENCOUNTER — Ambulatory Visit (INDEPENDENT_AMBULATORY_CARE_PROVIDER_SITE_OTHER): Payer: Managed Care, Other (non HMO)

## 2020-08-18 DIAGNOSIS — J309 Allergic rhinitis, unspecified: Secondary | ICD-10-CM | POA: Diagnosis not present

## 2020-08-30 ENCOUNTER — Other Ambulatory Visit: Payer: Self-pay

## 2020-08-30 ENCOUNTER — Ambulatory Visit (INDEPENDENT_AMBULATORY_CARE_PROVIDER_SITE_OTHER): Payer: Managed Care, Other (non HMO)

## 2020-08-30 DIAGNOSIS — J309 Allergic rhinitis, unspecified: Secondary | ICD-10-CM

## 2020-09-06 NOTE — Progress Notes (Signed)
VIALS MADE. EXP 09-06-21 

## 2020-09-07 DIAGNOSIS — J3089 Other allergic rhinitis: Secondary | ICD-10-CM | POA: Diagnosis not present

## 2020-09-08 ENCOUNTER — Other Ambulatory Visit: Payer: Self-pay

## 2020-09-08 ENCOUNTER — Ambulatory Visit (INDEPENDENT_AMBULATORY_CARE_PROVIDER_SITE_OTHER): Payer: Managed Care, Other (non HMO) | Admitting: Allergy

## 2020-09-08 ENCOUNTER — Encounter: Payer: Self-pay | Admitting: Allergy

## 2020-09-08 VITALS — BP 136/82 | HR 68 | Temp 97.7°F | Resp 18

## 2020-09-08 DIAGNOSIS — R040 Epistaxis: Secondary | ICD-10-CM | POA: Diagnosis not present

## 2020-09-08 DIAGNOSIS — J302 Other seasonal allergic rhinitis: Secondary | ICD-10-CM

## 2020-09-08 DIAGNOSIS — H1010 Acute atopic conjunctivitis, unspecified eye: Secondary | ICD-10-CM

## 2020-09-08 DIAGNOSIS — H1013 Acute atopic conjunctivitis, bilateral: Secondary | ICD-10-CM

## 2020-09-08 DIAGNOSIS — J309 Allergic rhinitis, unspecified: Secondary | ICD-10-CM

## 2020-09-08 DIAGNOSIS — J3089 Other allergic rhinitis: Secondary | ICD-10-CM

## 2020-09-08 HISTORY — DX: Epistaxis: R04.0

## 2020-09-08 MED ORDER — TRIAMCINOLONE ACETONIDE 55 MCG/ACT NA AERO
1.0000 | INHALATION_SPRAY | Freq: Two times a day (BID) | NASAL | 5 refills | Status: DC | PRN
Start: 2020-09-08 — End: 2021-07-13

## 2020-09-08 MED ORDER — CETIRIZINE HCL 10 MG PO TABS: 10.0000 mg | ORAL_TABLET | Freq: Every day | ORAL | 5 refills | Status: AC

## 2020-09-08 NOTE — Assessment & Plan Note (Addendum)
   Stop dymista.  Start Nasacort in 2 weeks 1 spray per nostril twice a day.  [name] Apply saline nasal gel in each nostril twice a day for 2 weeks to allow the nasal mucosa to heal . Consider using a humidifier in the winter  Try to keep your blood pressure as normal as possible (120/80) She is already taking hydroxyzine at night as a sleeping aid.

## 2020-09-08 NOTE — Patient Instructions (Addendum)
Environmental allergies Past skin testing showed: Positive to grass, tree, weed pollen, ragweed, cockroach and dust mites. Continue environmental control measures as below. Use over the counter antihistamines such as Zyrtec (cetirizine), Claritin (loratadine), Allegra (fexofenadine), or Xyzal (levocetirizine) daily at night. May take twice a day during allergy flares. May switch antihistamines every few months. Continue Singulair (montelukast) 10mg  daily at night. May use olopatadine eye drops 0.2% once a day as needed for itchy/watery eyes. Continue allergy injections - given today.  STOP dymista due to nasal irritation. Use saline rinse once a day. Apply saline nasal gel in each nostril twice a day for 2 weeks to allow the nasal mucosa to heal.  After 2 weeks:  Start Nasacort (triamcinolone) nasal spray 1 spray per nostril twice a day as needed for nasal congestion.   Nose Bleeds: Nosebleeds are very common.  Site of the bleeding is typically on the septum or at the very front of the nose.  Some of the more common causes are from trauma, inflammation or medication induced. Pinch both nostrils while leaning forward for at least 5 minutes before checking to see if the bleeding has stopped. If bleeding is not controlled within 5-10 minutes apply a cotton ball soaked with oxymetazoline (Afrin) to the bleeding nostril for a few seconds.  Preventative treatment: Apply saline nasal gel in each nostril twice a day for 2 weeks to allow the nasal mucosa to heal Consider using a humidifier in the winter Try to keep your blood pressure as normal as possible (120/80)  Follow up in 3 months or sooner if needed.   Reducing Pollen Exposure Pollen seasons: trees (spring), grass (summer) and ragweed/weeds (fall). Keep windows closed in your home and car to lower pollen exposure.  Install air conditioning in the bedroom and throughout the house if possible.  Avoid going out in dry windy days - especially  early morning. Pollen counts are highest between 5 - 10 AM and on dry, hot and windy days.  Save outside activities for late afternoon or after a heavy rain, when pollen levels are lower.  Avoid mowing of grass if you have grass pollen allergy. Be aware that pollen can also be transported indoors on people and pets.  Dry your clothes in an automatic dryer rather than hanging them outside where they might collect pollen.  Rinse hair and eyes before bedtime.  Control of House Dust Mite Allergen Dust mite allergens are a common trigger of allergy and asthma symptoms. While they can be found throughout the house, these microscopic creatures thrive in warm, humid environments such as bedding, upholstered furniture and carpeting. Because so much time is spent in the bedroom, it is essential to reduce mite levels there.  Encase pillows, mattresses, and box springs in special allergen-proof fabric covers or airtight, zippered plastic covers.  Bedding should be washed weekly in hot water (130 F) and dried in a hot dryer. Allergen-proof covers are available for comforters and pillows that can't be regularly washed.  Wash the allergy-proof covers every few months. Minimize clutter in the bedroom. Keep pets out of the bedroom.  Keep humidity less than 50% by using a dehumidifier or air conditioning. You can buy a humidity measuring device called a hygrometer to monitor this.  If possible, replace carpets with hardwood, linoleum, or washable area rugs. If that's not possible, vacuum frequently with a vacuum that has a HEPA filter. Remove all upholstered furniture and non-washable window drapes from the bedroom. Remove all non-washable stuffed toys  from the bedroom.  Wash stuffed toys weekly. Cockroach Allergen Avoidance Cockroaches are often found in the homes of densely populated urban areas, schools or commercial buildings, but these creatures can lurk almost anywhere. This does not mean that you have a  dirty house or living area. Block all areas where roaches can enter the home. This includes crevices, wall cracks and windows.  Cockroaches need water to survive, so fix and seal all leaky faucets and pipes. Have an exterminator go through the house when your family and pets are gone to eliminate any remaining roaches. Keep food in lidded containers and put pet food dishes away after your pets are done eating. Vacuum and sweep the floor after meals, and take out garbage and recyclables. Use lidded garbage containers in the kitchen. Wash dishes immediately after use and clean under stoves, refrigerators or toasters where crumbs can accumulate. Wipe off the stove and other kitchen surfaces and cupboards regularly.

## 2020-09-08 NOTE — Assessment & Plan Note (Addendum)
Past history - Perennial rhinoconjunctivitis symptoms for many years but worse the last 2.  Patient was on allergy immunotherapy for 5 years in the 107s with good benefit.  Status post sinus surgery in the 1990s.  Possibly PCP saw polyps 1 year ago. 2021 skin testing showed: Positive to grass, tree, weed pollen, ragweed, cockroach and dust mites. Started AIT on 03/01/2020 (Dm- Cr & G-Rw-W-T). Interim history -  Some localized reactions with AIT and having sinus symptoms/throat clearing with epistaxis.   Continue environmental control measures as below.  Use over the counter antihistamines such as Zyrtec (cetirizine), Claritin (loratadine), Allegra (fexofenadine), or Xyzal (levocetirizine) daily at night. May take twice a day during allergy flares. May switch antihistamines every few months.  Continue Singulair (montelukast) 10mg  daily at night.  May use olopatadine eye drops 0.2% once a day as needed for itchy/watery eyes.  Continue allergy injections - given today. STOP dymista due to nasal irritation. . Use saline rinse once a day. [name] Apply saline nasal gel in each nostril twice a day for 2 weeks to allow the nasal mucosa to heal. . After 2 weeks: Start Nasacort (triamcinolone) nasal spray 1 spray per nostril twice a day as needed for nasal congestion.

## 2020-09-08 NOTE — Progress Notes (Signed)
Follow Up Note  RE: Kristie Owens MRN: [id number] DOB: 03-03-1955 Date of Office Visit: 09/08/2020  Referring provider: Natalia Leatherwood, DO Primary care provider: Natalia Leatherwood, DO  Chief Complaint: Allergic Rhinitis   History of Present Illness: I had the pleasure of seeing Kristie Owens for a follow up visit at the [facility] on 09/08/2020. She is a 65 y.o. female, who is being followed for allergic rhinoconjunctivitis on AIT. Her previous allergy office visit was on 03/31/2020 with Dr. Selena Batten. Today is a regular follow up visit.  Seasonal and perennial allergic rhinoconjunctivitis Some localized itching/soreness after the injections.  Currently having some nasal congestion, itchy ears, coughing at times. Takes Singulair daily at night. Zyrtec caused drowsiness so not taking any antihistamines.  Using dymista 1 spray per nostril twice a day and does not like the taste. She thinks it may be making symptoms worse. Some nosebleeds every 2-3 days.  Using eye drops most days with good benefit.   Assessment and Plan: Kristie Owens is a 65 y.o. female with: Seasonal and perennial allergic rhinoconjunctivitis Past history - Perennial rhinoconjunctivitis symptoms for many years but worse the last 2.  Patient was on allergy immunotherapy for 5 years in the 6s with good benefit.  Status post sinus surgery in the 1990s.  Possibly PCP saw polyps 1 year ago. 2021 skin testing showed: Positive to grass, tree, weed pollen, ragweed, cockroach and dust mites. Started AIT on 03/01/2020 (Dm- Cr & G-[id number]). Interim history -  Some localized reactions with AIT and having sinus symptoms/throat clearing with epistaxis.  Continue environmental control measures as below. Use over the counter antihistamines such as Zyrtec (cetirizine), Claritin (loratadine), Allegra (fexofenadine), or Xyzal (levocetirizine) daily at night. May take twice a day during allergy flares. May switch antihistamines every  few months. Continue Singulair (montelukast) 10mg  daily at night. May use olopatadine eye drops 0.2% once a day as needed for itchy/watery eyes. Continue allergy injections - given today. STOP dymista due to nasal irritation. Use saline rinse once a day. Apply saline nasal gel in each nostril twice a day for 2 weeks to allow the nasal mucosa to heal. After 2 weeks: Start Nasacort (triamcinolone) nasal spray 1 spray per nostril twice a day as needed for nasal congestion.  Epistaxis Stop dymista. Start Nasacort in 2 weeks 1 spray per nostril twice a day.  Apply saline nasal gel in each nostril twice a day for 2 weeks to allow the nasal mucosa to heal Consider using a humidifier in the winter Try to keep your blood pressure as normal as possible (120/80) She is already taking hydroxyzine at night as a sleeping aid.  Return in about 3 months (around 12/09/2020).  Meds ordered this encounter  Medications   triamcinolone (NASACORT) 55 MCG/ACT AERO nasal inhaler    Sig: Place 1 spray into the nose 2 (two) times daily as needed (nasal congestion).    Dispense:  1 each    Refill:  5   cetirizine (ZYRTEC ALLERGY) 10 MG tablet    Sig: Take 1 tablet (10 mg total) by mouth daily.    Dispense:  30 tablet    Refill:  5    Lab Orders  No laboratory test(s) ordered today    Diagnostics: None.  Medication List:  Current Outpatient Medications  Medication Sig Dispense Refill   amLODipine (NORVASC) 5 MG tablet Take 1 tablet (5 mg total) by mouth daily. 90 tablet 1   aspirin EC  81 MG tablet Take 81 mg by mouth daily.     carvedilol (COREG) 6.25 MG tablet Take 1 tablet (6.25 mg total) by mouth 2 (two) times daily with a meal. 180 tablet 1   cetirizine (ZYRTEC ALLERGY) 10 MG tablet Take 1 tablet (10 mg total) by mouth daily. 30 tablet 5   EPINEPHrine 0.3 mg/0.3 mL IJ SOAJ injection Inject 0.3 mg into the muscle as needed for anaphylaxis. 0.3 mL 2   lisinopril-hydrochlorothiazide (ZESTORETIC)  20-12.5 MG tablet Take 1 tablet by mouth daily. 90 tablet 1   meloxicam (MOBIC) 15 MG tablet Take 1 tablet (15 mg total) by mouth daily. 90 tablet 1   metFORMIN (GLUCOPHAGE) 500 MG tablet Take 1 tablet (500 mg total) by mouth daily with breakfast. 90 tablet 1   montelukast (SINGULAIR) 10 MG tablet Take 1 tablet (10 mg total) by mouth at bedtime. 90 tablet 1   nitroGLYCERIN (NITROSTAT) 0.4 MG SL tablet Place 1 tablet (0.4 mg total) under the tongue every 5 (five) minutes as needed for chest pain. 25 tablet 3   Olopatadine HCl (PATADAY OP) Place 1 drop into both eyes daily as needed (for itching or dryness).     omeprazole (PRILOSEC OTC) 20 MG tablet Take 20 mg by mouth daily as needed (acid reflux).     rosuvastatin (CRESTOR) 20 MG tablet Take 1 tablet (20 mg total) by mouth daily. 90 tablet 3   sertraline (ZOLOFT) 100 MG tablet Take 1 tablet (100 mg total) by mouth at bedtime. 90 tablet 1   Suvorexant (BELSOMRA) 10 MG TABS Take 1 tablet by mouth at bedtime as needed. 30 tablet 5   triamcinolone (NASACORT) 55 MCG/ACT AERO nasal inhaler Place 1 spray into the nose 2 (two) times daily as needed (nasal congestion). 1 each 5   hydrOXYzine (ATARAX/VISTARIL) 10 MG tablet Take 1-5 tablets (10-50 mg total) by mouth at bedtime as needed. (Patient not taking: Reported on 09/08/2020) 90 tablet 0   isosorbide mononitrate (IMDUR) 60 MG 24 hr tablet Take 1.5 tablets (90 mg total) by mouth daily. 45 tablet 11   No current facility-administered medications for this visit.   Allergies: Allergies  Allergen Reactions   Bupropion Nausea Only and Other (See Comments)    Nausea and headaches    Sulfa Antibiotics Other (See Comments)    From childhood- reaction not recalled   Prednisone Anxiety and Other (See Comments)    "Made me not feel like myself, altered"   I reviewed her past medical history, social history, family history, and environmental history and no significant changes have been reported from her  previous visit.  Review of Systems  Constitutional:  Negative for appetite change, chills, fever and unexpected weight change.  HENT:  Positive for congestion, nosebleeds, postnasal drip, rhinorrhea and sneezing.   Eyes:  Positive for itching.  Respiratory:  Positive for cough. Negative for chest tightness, shortness of breath and wheezing.   Cardiovascular:  Negative for chest pain.  Gastrointestinal:  Negative for abdominal pain.  Genitourinary:  Negative for difficulty urinating.  Skin:  Negative for rash.  Allergic/Immunologic: Positive for environmental allergies.  Neurological:  Positive for headaches.   Objective: BP 136/82   Pulse 68   Temp 97.7 F (36.5 C) (Temporal)   Resp 18   SpO2 95%  There is no height or weight on file to calculate BMI. Physical Exam Vitals and nursing note reviewed.  Constitutional:      Appearance: Normal appearance. She is well-developed.  HENT:     Head: Normocephalic and atraumatic.     Right Ear: Tympanic membrane and external ear normal.     Left Ear: Tympanic membrane and external ear normal.     Nose:     Comments: Dried blood on the septum b/l.    Mouth/Throat:     Mouth: Mucous membranes are moist.     Pharynx: Oropharynx is clear.  Eyes:     Conjunctiva/sclera: Conjunctivae normal.  Cardiovascular:     Rate and Rhythm: Normal rate and regular rhythm.     Heart sounds: Normal heart sounds. No murmur heard.   No friction rub. No gallop.  Pulmonary:     Effort: Pulmonary effort is normal.     Breath sounds: Normal breath sounds. No wheezing, rhonchi or rales.  Musculoskeletal:     Cervical back: Neck supple.  Skin:    General: Skin is warm.     Findings: No rash.  Neurological:     Mental Status: She is alert and oriented to person, place, and time.  Psychiatric:        Behavior: Behavior normal.   Previous notes and tests were reviewed. The plan was reviewed with the patient/family, and all questions/concerned were  addressed.  It was my pleasure to see Kearstin today and participate in her care. Please feel free to contact me with any questions or concerns.  Sincerely,  Wyline Mood, DO Allergy & Immunology  [facility] office: [phone] [facility] office: [phone]

## 2020-09-13 ENCOUNTER — Ambulatory Visit (INDEPENDENT_AMBULATORY_CARE_PROVIDER_SITE_OTHER): Payer: Managed Care, Other (non HMO)

## 2020-09-13 ENCOUNTER — Other Ambulatory Visit: Payer: Self-pay

## 2020-09-13 DIAGNOSIS — J309 Allergic rhinitis, unspecified: Secondary | ICD-10-CM

## 2020-09-19 ENCOUNTER — Other Ambulatory Visit: Payer: Self-pay | Admitting: Cardiology

## 2020-09-20 ENCOUNTER — Other Ambulatory Visit: Payer: Self-pay

## 2020-09-20 ENCOUNTER — Ambulatory Visit (INDEPENDENT_AMBULATORY_CARE_PROVIDER_SITE_OTHER): Payer: Managed Care, Other (non HMO)

## 2020-09-20 DIAGNOSIS — J309 Allergic rhinitis, unspecified: Secondary | ICD-10-CM | POA: Diagnosis not present

## 2020-09-27 ENCOUNTER — Ambulatory Visit (INDEPENDENT_AMBULATORY_CARE_PROVIDER_SITE_OTHER): Payer: Managed Care, Other (non HMO)

## 2020-09-27 ENCOUNTER — Other Ambulatory Visit: Payer: Self-pay

## 2020-09-27 DIAGNOSIS — J309 Allergic rhinitis, unspecified: Secondary | ICD-10-CM | POA: Diagnosis not present

## 2020-10-04 ENCOUNTER — Ambulatory Visit: Payer: Medicare Other | Admitting: Allergy

## 2020-10-06 ENCOUNTER — Ambulatory Visit (INDEPENDENT_AMBULATORY_CARE_PROVIDER_SITE_OTHER): Payer: Managed Care, Other (non HMO)

## 2020-10-06 ENCOUNTER — Other Ambulatory Visit: Payer: Self-pay

## 2020-10-06 DIAGNOSIS — J309 Allergic rhinitis, unspecified: Secondary | ICD-10-CM

## 2020-10-11 ENCOUNTER — Other Ambulatory Visit: Payer: Self-pay

## 2020-10-11 ENCOUNTER — Ambulatory Visit (INDEPENDENT_AMBULATORY_CARE_PROVIDER_SITE_OTHER): Payer: Managed Care, Other (non HMO)

## 2020-10-11 DIAGNOSIS — J309 Allergic rhinitis, unspecified: Secondary | ICD-10-CM

## 2020-10-18 ENCOUNTER — Ambulatory Visit (INDEPENDENT_AMBULATORY_CARE_PROVIDER_SITE_OTHER): Payer: Managed Care, Other (non HMO)

## 2020-10-18 ENCOUNTER — Other Ambulatory Visit: Payer: Self-pay

## 2020-10-18 DIAGNOSIS — J309 Allergic rhinitis, unspecified: Secondary | ICD-10-CM | POA: Diagnosis not present

## 2020-10-25 ENCOUNTER — Other Ambulatory Visit: Payer: Self-pay

## 2020-10-25 ENCOUNTER — Ambulatory Visit (INDEPENDENT_AMBULATORY_CARE_PROVIDER_SITE_OTHER): Payer: Managed Care, Other (non HMO)

## 2020-10-25 DIAGNOSIS — J309 Allergic rhinitis, unspecified: Secondary | ICD-10-CM | POA: Diagnosis not present

## 2020-11-01 ENCOUNTER — Other Ambulatory Visit: Payer: Self-pay

## 2020-11-01 ENCOUNTER — Ambulatory Visit (INDEPENDENT_AMBULATORY_CARE_PROVIDER_SITE_OTHER): Payer: Managed Care, Other (non HMO)

## 2020-11-01 DIAGNOSIS — J309 Allergic rhinitis, unspecified: Secondary | ICD-10-CM | POA: Diagnosis not present

## 2020-11-21 ENCOUNTER — Encounter: Payer: Self-pay | Admitting: Family Medicine

## 2020-11-21 ENCOUNTER — Ambulatory Visit (INDEPENDENT_AMBULATORY_CARE_PROVIDER_SITE_OTHER): Payer: Managed Care, Other (non HMO) | Admitting: Family Medicine

## 2020-11-21 ENCOUNTER — Other Ambulatory Visit: Payer: Self-pay

## 2020-11-21 VITALS — BP 125/72 | HR 67 | Temp 98.3°F | Ht 64.0 in | Wt 202.0 lb

## 2020-11-21 DIAGNOSIS — R002 Palpitations: Secondary | ICD-10-CM | POA: Diagnosis not present

## 2020-11-21 DIAGNOSIS — E119 Type 2 diabetes mellitus without complications: Secondary | ICD-10-CM | POA: Diagnosis not present

## 2020-11-21 DIAGNOSIS — Z23 Encounter for immunization: Secondary | ICD-10-CM

## 2020-11-21 DIAGNOSIS — M1990 Unspecified osteoarthritis, unspecified site: Secondary | ICD-10-CM

## 2020-11-21 DIAGNOSIS — F3340 Major depressive disorder, recurrent, in remission, unspecified: Secondary | ICD-10-CM

## 2020-11-21 DIAGNOSIS — I25118 Atherosclerotic heart disease of native coronary artery with other forms of angina pectoris: Secondary | ICD-10-CM

## 2020-11-21 DIAGNOSIS — I200 Unstable angina: Secondary | ICD-10-CM

## 2020-11-21 DIAGNOSIS — Z955 Presence of coronary angioplasty implant and graft: Secondary | ICD-10-CM

## 2020-11-21 DIAGNOSIS — E785 Hyperlipidemia, unspecified: Secondary | ICD-10-CM

## 2020-11-21 DIAGNOSIS — E7800 Pure hypercholesterolemia, unspecified: Secondary | ICD-10-CM

## 2020-11-21 DIAGNOSIS — E1169 Type 2 diabetes mellitus with other specified complication: Secondary | ICD-10-CM

## 2020-11-21 DIAGNOSIS — F5101 Primary insomnia: Secondary | ICD-10-CM

## 2020-11-21 DIAGNOSIS — I10 Essential (primary) hypertension: Secondary | ICD-10-CM

## 2020-11-21 LAB — POCT GLYCOSYLATED HEMOGLOBIN (HGB A1C)
HbA1c POC (<> result, manual entry): 5.8 % (ref 4.0–5.6)
HbA1c, POC (controlled diabetic range): 5.8 % (ref 0.0–7.0)
HbA1c, POC (prediabetic range): 5.8 % (ref 5.7–6.4)
Hemoglobin A1C: 5.8 % — AB (ref 4.0–5.6)

## 2020-11-21 MED ORDER — METFORMIN HCL 500 MG PO TABS: 500.0000 mg | ORAL_TABLET | Freq: Every day | ORAL | 1 refills | Status: DC

## 2020-11-21 MED ORDER — MIRTAZAPINE 15 MG PO TABS: 15.0000 mg | ORAL_TABLET | Freq: Every day | ORAL | 1 refills | Status: DC

## 2020-11-21 MED ORDER — MELOXICAM 15 MG PO TABS: 15.0000 mg | ORAL_TABLET | Freq: Every day | ORAL | 1 refills | Status: DC

## 2020-11-21 MED ORDER — LISINOPRIL-HYDROCHLOROTHIAZIDE 20-12.5 MG PO TABS: 1.0000 | ORAL_TABLET | Freq: Every day | ORAL | 1 refills | Status: DC

## 2020-11-21 MED ORDER — AMLODIPINE BESYLATE 5 MG PO TABS: 5.0000 mg | ORAL_TABLET | Freq: Every day | ORAL | 1 refills | Status: DC

## 2020-11-21 MED ORDER — MONTELUKAST SODIUM 10 MG PO TABS: 10.0000 mg | ORAL_TABLET | Freq: Every day | ORAL | 1 refills | Status: DC

## 2020-11-21 MED ORDER — CARVEDILOL 6.25 MG PO TABS: 6.2500 mg | ORAL_TABLET | Freq: Two times a day (BID) | ORAL | 1 refills | Status: DC

## 2020-11-21 MED ORDER — ROSUVASTATIN CALCIUM 20 MG PO TABS: 20.0000 mg | ORAL_TABLET | Freq: Every day | ORAL | 3 refills | Status: DC

## 2020-11-21 MED ORDER — SERTRALINE HCL 100 MG PO TABS: 100.0000 mg | ORAL_TABLET | Freq: Every evening | ORAL | 1 refills | Status: DC

## 2020-11-21 NOTE — Progress Notes (Signed)
This visit occurred during the SARS-CoV-2 public health emergency.  Safety protocols were in place, including screening questions prior to the visit, additional usage of staff PPE, and extensive cleaning of exam room while observing appropriate contact time as indicated for disinfecting solutions.    Patient ID: Kristie Owens, female  DOB: 11-13-1955, 66 y.o.   MRN: [id number] Patient Care Team    Relationship Specialty Notifications Start End  Ma Hillock, DO PCP - General Family Medicine  06/09/19   Jerline Pain, MD PCP - Cardiology Cardiology  12/17/19   Jerline Pain, MD Consulting Physician Cardiology  06/08/19   Otelia Sergeant, OD Referring Physician   06/11/19   Ralene Muskrat Physician Assistant Chiropractic Medicine  06/11/19     Chief Complaint  Patient presents with   Anxiety    [place]; pt is not fasting    Subjective: Kristie Owens is a 65 y.o.  Female  present for [facility] Hypertension/hyperlipidemia/coronary artery disease/TIA: Pt reports compliance  with lisinopril-HCTZ 20-12.5, amlodipine 5 mg, Coreg 6.25 mg twice daily, Crestor 20 mg daily, Imdur 60 mg daily. Blood pressures ranges at home  within normal limits. Patient denies chest pain, shortness of breath, dizziness or lower extremity edema.   Patient is established with Dr. Marlou Porch for cardiology.  Has a history of coronary artery disease status post heart catheterization in December 2012 and May 2018 demonstrating widely patent LAD and RCA stent, mild irregularity of the ostium of the diagonal where PTCA was felt to be high risk RF: Hypertension, hyperlipidemia, obesity, family history of heart disease  Echocardiogram 12/07/2019 IMPRESSIONS   1. Left ventricular ejection fraction, by estimation, is 65 to 70%. The  left ventricle has normal function. The left ventricle has no regional  wall motion abnormalities. There is mild left ventricular hypertrophy.  Left ventricular diastolic parameters  are  indeterminate.   2. Right ventricular systolic function is normal. The right ventricular  size is normal. Tricuspid regurgitation signal is inadequate for assessing  PA pressure.   3. The mitral valve is normal in structure. No evidence of mitral valve  regurgitation.   4. The aortic valve was not well visualized. Aortic valve regurgitation  is not visualized. Mild aortic valve sclerosis is present, with no  evidence of aortic valve stenosis  Cardiac catheterization on  12/04/2019 1. Patent proximal and mid LAD stents 2. Small caliber circumflex with mild to moderate non-obstructive disease 3. Large dominant RCA with patent mid stent, mild proximal stenosis. Moderate disease in the PDA and posterolateral artery, unchanged from last cath.  4. Normal LV systolic function, EHUD=14-97%. LVEDP 16   Recommendations: Continue medical management of CAD.    06/04/16: Widely patent proximal and distal LAD stents. 40% ostial narrowing in the first diagonal. Patent mid right coronary stents. Proximal 40% RCA eccentric narrowing. Circumflex is a small vessel with 40-50% first obtuse marginal. Ramus intermedius (or first obtuse marginal depending upon categorization) contains 40% proximal narrowing. Normal LV function. Elevated LVEDP at 23 mmHg. EF 55%.   Depression/anxiety/insomnia: Patient has a history of depression with anxiety as well as insomnia. She reports compliance with Zoloft 100 mg daily. Ambien, trazodone, vistaril has been tried for insomnia- still not sleeping great even on belsomra 10 mg and it is 75$ a mo.    Allergic rhinitis: Patient reports compliance  with Singulair and dymista nasal spray.  She is taking allegra.    Diabetes type 2: Patient has history of mildly elevated  A1c in the diabetic range of 6.6>6.7> started metformin 500 mg qd > 5.7 today. She is watching her diet and compliant with metformin. Patient denies dizziness, hyperglycemic or hypoglycemic events. Patient  denies numbness, tingling in the extremities or nonhealing wounds of feet.    Depression screen [facility] 2/9 11/21/2020 06/06/2020 12/30/2019 07/22/2019 06/09/2019  Decreased Interest 0 0 0 0 0  Down, Depressed, Hopeless 0 1 0 0 0  PHQ - 2 Score 0 1 0 0 0  Altered sleeping 3 3 0 0 2  Tired, decreased energy 1 2 0 0 0  Change in appetite 0 0 0 0 3  Feeling bad or failure about yourself  0 0 0 0 0  Trouble concentrating 0 0 0 0 0  Moving slowly or fidgety/restless 0 0 0 0 0  Suicidal thoughts 0 0 0 0 0  PHQ-9 Score 4 6 0 0 5  Difficult doing work/chores - Not difficult at all - Not difficult at all Not difficult at all   GAD 7 : Generalized Anxiety Score 11/21/2020 06/06/2020  Nervous, Anxious, on Edge 0 0  Control/stop worrying 0 0  Worry too much - different things 0 0  Trouble relaxing 0 1  Restless 0 0  Easily annoyed or irritable 0 0  Afraid - awful might happen 0 0  Total GAD 7 Score 0 1  Anxiety Difficulty - Not difficult at all     Immunization History  Administered Date(s) Administered   Fluad Quad(high Dose 65+) 10/19/2019, 11/21/2020   Influenza Split 12/28/2010, 11/22/2011, 11/13/2012   Influenza, High Dose Seasonal PF 11/21/2018   Influenza,inj,Quad PF,6+ Mos 12/15/2013, 12/13/2016, 11/21/2017   Influenza,inj,quad, With Preservative 09/19/2015   Influenza-Unspecified 11/21/2018   Moderna Sars-Covid-2 Vaccination 02/19/2019, 03/27/2019, 12/19/2019   Pneumococcal Conjugate-13 11/21/2017   Pneumococcal Polysaccharide-23 11/25/2018   Tdap 08/10/2013   Zoster Recombinat (Shingrix) 12/30/2019, 11/21/2020    Past Medical History:  Diagnosis Date   Achilles bursitis or tendinitis 11/11/2012   right    Allergy    Angina at rest [facility])    Dr Marlou Porch   CAD (coronary artery disease), native coronary artery    DES to LAD in 2 areas, one at the bifurcation of a diagonal branch and also in the mid to distal region. Her diagonal branch was also ballooned . She has a DES to RCA  10/10. Her circumflex artery mild ostial disease.   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    Tonsillar calculus 08/12/2018   Allergies  Allergen Reactions   Bupropion Nausea Only and Other (See Comments)    Nausea and headaches    Sulfa Antibiotics Other (See Comments)    From childhood- reaction not recalled   Prednisone Anxiety and Other (See Comments)    "Made me not feel like myself, altered"   Past Surgical History:  Procedure Laterality Date   BREAST EXCISIONAL BIOPSY Right [place]  1983   FOOT SURGERY     LEFT HEART CATH AND CORONARY ANGIOGRAPHY N/A 06/04/2016   Procedure: Left Heart Cath and Coronary Angiography;  Surgeon: Belva Crome, MD;  Location: [place] [facility];  Service: Cardiovascular;  Laterality: N/A;   LEFT HEART CATH AND CORONARY ANGIOGRAPHY N/A 12/04/2019   Procedure: LEFT HEART CATH AND CORONARY ANGIOGRAPHY;  Surgeon: Burnell Blanks, MD;  Location: [place] [facility];  Service: Cardiovascular;  Laterality: N/A;   LEFT HEART CATHETERIZATION WITH CORONARY ANGIOGRAM N/A 01/16/2011   Procedure: LEFT HEART CATHETERIZATION WITH CORONARY ANGIOGRAM;  Surgeon: Candee Furbish, MD;  Location: [facility] CATH LAB;  Service: Cardiovascular;  Laterality: N/A;   NOSE SURGERY     SINOSCOPY     Family History  Problem Relation Age of Onset   Hyperlipidemia Brother    Hypertension Brother    Intellectual disability Brother    Kidney disease Brother    Hypertension Mother    Heart disease Mother    COPD Mother    Ulcers Mother    Lung  cancer Father    Heart failure Paternal Grandmother    Heart attack Paternal Grandmother    Hypertension Paternal Grandmother    Hyperlipidemia Paternal Grandmother    Heart disease Paternal Grandmother    Heart attack Brother    Asthma Brother    Early death Brother 33   Hyperlipidemia Brother    Hypertension Brother    Intellectual disability Brother    Kidney disease Brother    Heart disease Brother    Hypertension Sister    Diabetes Sister    Early death Maternal Grandmother    Tuberculosis Maternal Grandmother    Lung cancer Maternal Grandfather    Suicidality Maternal Grandfather    Early death Maternal Grandfather    Heart disease Paternal Grandfather    Hyperlipidemia Paternal Grandfather    Hypertension Paternal Grandfather    Heart attack Other    Arthritis Sister    Depression Sister    Miscarriages / Stillbirths Sister    Alcohol abuse Brother    Stroke Neg Hx    Social History   Social History Narrative   Marital status/children/pets: Married.  One child.   Education/employment: Retired.  Associates degree in college.   Safety:      -Wears a bicycle helmet riding a bike: Yes     -smoke alarm in the home:Yes     - wears seatbelt: Yes     - Feels safe in their relationships: Yes    Allergies as of 11/21/2020       Reactions   Bupropion Nausea Only, Other (See Comments)   Nausea and headaches    Sulfa Antibiotics Other (See Comments)   From childhood- reaction not recalled   Prednisone Anxiety, Other (See Comments)   "Made me not feel like myself, altered"        Medication List        Accurate as of November 21, 2020 12:08 PM. If you have any questions, ask your nurse or doctor.          STOP taking these medications    Belsomra 10 MG Tabs Generic drug: Suvorexant Stopped by: Howard Pouch, DO   hydrOXYzine 10 MG tablet Commonly known as: ATARAX/VISTARIL Stopped by: Howard Pouch, DO       TAKE these medications    amLODipine 5  MG tablet Commonly known as: NORVASC Take 1 tablet (5 mg total) by mouth daily.   aspirin  EC 81 MG tablet Take 81 mg by mouth daily.   carvedilol 6.25 MG tablet Commonly known as: COREG Take 1 tablet (6.25 mg total) by mouth 2 (two) times daily with a meal.   cetirizine 10 MG tablet Commonly known as: ZyrTEC Allergy Take 1 tablet (10 mg total) by mouth daily.   EPINEPHrine 0.3 mg/0.3 mL Soaj injection Commonly known as: EPI-PEN Inject 0.3 mg into the muscle as needed for anaphylaxis.   isosorbide mononitrate 60 MG 24 hr tablet Commonly known as: IMDUR TAKE 1.5 TABLETS (90 MG TOTAL) BY MOUTH DAILY.   lisinopril-hydrochlorothiazide 20-12.5 MG tablet Commonly known as: ZESTORETIC Take 1 tablet by mouth daily.   meloxicam 15 MG tablet Commonly known as: MOBIC Take 1 tablet (15 mg total) by mouth daily.   metFORMIN 500 MG tablet Commonly known as: GLUCOPHAGE Take 1 tablet (500 mg total) by mouth daily with breakfast.   mirtazapine 15 MG tablet Commonly known as: REMERON Take 1 tablet (15 mg total) by mouth at bedtime. Started by: Howard Pouch, DO   montelukast 10 MG tablet Commonly known as: SINGULAIR Take 1 tablet (10 mg total) by mouth at bedtime.   nitroGLYCERIN 0.4 MG SL tablet Commonly known as: NITROSTAT Place 1 tablet (0.4 mg total) under the tongue every 5 (five) minutes as needed for chest pain.   omeprazole 20 MG tablet Commonly known as: PRILOSEC OTC Take 20 mg by mouth daily as needed (acid reflux).   PATADAY OP Place 1 drop into both eyes daily as needed (for itching or dryness).   rosuvastatin 20 MG tablet Commonly known as: CRESTOR Take 1 tablet (20 mg total) by mouth daily.   sertraline 100 MG tablet Commonly known as: ZOLOFT Take 1 tablet (100 mg total) by mouth at bedtime.   triamcinolone 55 MCG/ACT Aero nasal inhaler Commonly known as: NASACORT Place 1 spray into the nose 2 (two) times daily as needed (nasal congestion).        All  past medical history, surgical history, allergies, family history, immunizations andmedications were updated in the EMR today and reviewed under the history and medication portions of their EMR.     No results found.   ROS: 14 pt review of systems performed and negative (unless mentioned in an HPI)  Objective: BP 125/72   Pulse 67   Temp 98.3 F (36.8 C) (Oral)   Ht $R'5\' 4"'Ig$  (1.626 m)   Wt 202 lb (91.6 kg)   SpO2 98%   BMI 34.67 kg/m  Gen: Afebrile. No acute distress. Nontoxic. Pleasant obese female.  HENT: AT. .  Eyes:Pupils Equal Round Reactive to light, Extraocular movements intact,  Conjunctiva without redness, discharge or icterus. CV: RRR no murmur, no edema Chest: CTAB, no wheeze or crackles Abd: Soft. NTND. BS present Neuro: Normal gait. PERLA. EOMi. Alert. Oriented x3 Psych: Normal affect, dress and demeanor. Normal speech. Normal thought content and judgment.    Results for orders placed or performed in visit on 11/21/20 (from the past 24 hour(s))  POCT HgB A1C     Status: Abnormal   Collection Time: 11/21/20  9:18 AM  Result Value Ref Range   Hemoglobin A1C 5.8 (A) 4.0 - 5.6 %   HbA1c POC (<> result, manual entry) 5.8 4.0 - 5.6 %   HbA1c, POC (prediabetic range) 5.8 5.7 - 6.4 %   HbA1c, POC (controlled diabetic range) 5.8 0.0 - 7.0 %     Assessment/plan: Kristie Owens is a 65 y.o. female present  for [facility] Osteoarthritis -Stable  -Continue Mobic qd with food.  - continue  OTC voltaren gel/cream.  - discussed cherry tart extract and tumeric natural supplements and their SE potentials  - ANA, RF, CCP, ESR, CRP > WNL   Major depressive disorder, remission status unspecified, unspecified whether recurrent/insomnia Stable pression anxiety/sleep disturbance still not well controlled continue Continue Zoloft 100 mg daily. Discontinued Ambien 2021.   DCd trazodone taper 25-75 mg nightly.> not helpful Dc vistaril> not helpful and felt bad on higher dose.  DC'd  belsomra > discontinued> expensive, will low-dose worked okay but not well. Start Remeron 15 mg nightly.    Diabetes type 2: Stable. Patient encouraged to continue exercise and dietary modifications.  Continue metformin 500 mg qd a1c improved today 5.7 (was 6.7)> 5.8 today.   Eye exam up-to-date 12/15/2019 Foot exam completed 11/21/2020 F/u 5.5 mos    Essential hypertension/hyperlipidemia/Presence of coronary angioplasty implant and graft/Atherosclerosis of native coronary artery of native heart with other form of angina pectoris (HCC)/Intermediate coronary syndrome (HCC)/ Palpitations/Transient neurologic deficit/Morbid obesity ([place]) Stable.  Continue daily exercise. Continue low-sodium/heart healthy diet. Continue Imdur> prescribed by cardiology. Continue  amlodipine 5 mg daily Continue  Coreg 6.25 mg twice daily Continue lisinopril-HCTZ 20-12.5 mg daily Continue Crestor 20 mg daily Continue baby aspirin daily   Allergic rhinitis, unspecified seasonality, unspecified trigger Continue antihistamine of choice.  OTC DC Flonase.  Continue continue Dymista. Continue Singulair established allergy and asthma in [facility] placed.  Has been receiving allergy injections.  Influenza vaccine administered today. Shingrix No. 2 administered today.    Return in about 3 months (around 03/01/2021) for CPE (30 min), [facility] (30 min).  Orders Placed This Encounter  Procedures   Flu Vaccine QUAD High Dose(Fluad)   Varicella-zoster vaccine IM   POCT HgB A1C     Meds ordered this encounter  Medications   sertraline (ZOLOFT) 100 MG tablet    Sig: Take 1 tablet (100 mg total) by mouth at bedtime.    Dispense:  90 tablet    Refill:  1   montelukast (SINGULAIR) 10 MG tablet    Sig: Take 1 tablet (10 mg total) by mouth at bedtime.    Dispense:  90 tablet    Refill:  1   meloxicam (MOBIC) 15 MG tablet    Sig: Take 1 tablet (15 mg total) by mouth daily.    Dispense:  90 tablet    Refill:  1    carvedilol (COREG) 6.25 MG tablet    Sig: Take 1 tablet (6.25 mg total) by mouth 2 (two) times daily with a meal.    Dispense:  180 tablet    Refill:  1   metFORMIN (GLUCOPHAGE) 500 MG tablet    Sig: Take 1 tablet (500 mg total) by mouth daily with breakfast.    Dispense:  90 tablet    Refill:  1   amLODipine (NORVASC) 5 MG tablet    Sig: Take 1 tablet (5 mg total) by mouth daily.    Dispense:  90 tablet    Refill:  1   lisinopril-hydrochlorothiazide (ZESTORETIC) 20-12.5 MG tablet    Sig: Take 1 tablet by mouth daily.    Dispense:  90 tablet    Refill:  1   rosuvastatin (CRESTOR) 20 MG tablet    Sig: Take 1 tablet (20 mg total) by mouth daily.    Dispense:  90 tablet    Refill:  3   mirtazapine (REMERON) 15 MG tablet  Sig: Take 1 tablet (15 mg total) by mouth at bedtime.    Dispense:  90 tablet    Refill:  1    Referral Orders  No referral(s) requested today     Electronically signed by: Howard Pouch, [place]

## 2020-11-21 NOTE — Patient Instructions (Addendum)
  Great to see you today.  I have refilled the medication(s) we provide.   If labs were collected, we will inform you of lab results once received either by echart message or telephone call.   - echart message- for normal results that have been seen by the patient already.   - telephone call: abnormal results or if patient has not viewed results in their echart.   Stop belsomra.  Start remeron (mirtazapine) 1 tab about 1 hr before bed.

## 2020-11-24 ENCOUNTER — Other Ambulatory Visit: Payer: Self-pay

## 2020-11-24 ENCOUNTER — Ambulatory Visit (INDEPENDENT_AMBULATORY_CARE_PROVIDER_SITE_OTHER): Payer: Managed Care, Other (non HMO)

## 2020-11-24 DIAGNOSIS — J309 Allergic rhinitis, unspecified: Secondary | ICD-10-CM

## 2020-11-29 ENCOUNTER — Other Ambulatory Visit: Payer: Self-pay

## 2020-11-29 ENCOUNTER — Ambulatory Visit (INDEPENDENT_AMBULATORY_CARE_PROVIDER_SITE_OTHER): Payer: Managed Care, Other (non HMO)

## 2020-11-29 DIAGNOSIS — J309 Allergic rhinitis, unspecified: Secondary | ICD-10-CM | POA: Diagnosis not present

## 2020-12-06 ENCOUNTER — Other Ambulatory Visit: Payer: Self-pay

## 2020-12-06 ENCOUNTER — Ambulatory Visit (INDEPENDENT_AMBULATORY_CARE_PROVIDER_SITE_OTHER): Payer: Managed Care, Other (non HMO)

## 2020-12-06 DIAGNOSIS — J309 Allergic rhinitis, unspecified: Secondary | ICD-10-CM | POA: Diagnosis not present

## 2020-12-13 ENCOUNTER — Ambulatory Visit (INDEPENDENT_AMBULATORY_CARE_PROVIDER_SITE_OTHER): Payer: Managed Care, Other (non HMO) | Admitting: Allergy

## 2020-12-13 ENCOUNTER — Encounter: Payer: Self-pay | Admitting: Allergy

## 2020-12-13 ENCOUNTER — Other Ambulatory Visit: Payer: Self-pay

## 2020-12-13 VITALS — BP 124/76 | HR 64 | Temp 98.0°F | Resp 16 | Ht 64.0 in | Wt 204.8 lb

## 2020-12-13 DIAGNOSIS — J3089 Other allergic rhinitis: Secondary | ICD-10-CM

## 2020-12-13 DIAGNOSIS — J309 Allergic rhinitis, unspecified: Secondary | ICD-10-CM

## 2020-12-13 DIAGNOSIS — H1013 Acute atopic conjunctivitis, bilateral: Secondary | ICD-10-CM | POA: Diagnosis not present

## 2020-12-13 DIAGNOSIS — J302 Other seasonal allergic rhinitis: Secondary | ICD-10-CM

## 2020-12-13 NOTE — Progress Notes (Signed)
Follow Up Note  RE: Kristie Owens MRN: [id number] DOB: May 04, 1955 Date of Office Visit: 12/13/2020  Referring provider: Natalia Leatherwood, DO Primary care provider: Natalia Leatherwood, DO  Chief Complaint: Allergic Rhinitis  (Has been doing good )  History of Present Illness: I had the pleasure of seeing Kristie Owens for a follow up visit at the [facility] on 12/13/2020. She is a 65 y.o. female, who is being followed for allergic rhino conjunctivitis on AIT, epistaxis. Her previous allergy office visit was on 09/08/2020 with Dr. Selena Batten. Today is a regular follow up visit.  Seasonal and perennial allergic rhinoconjunctivitis Allergies are doing better and had some localized reactions after the injections. Patient is not sure if she noted any significant improvement in her symptoms since starting the injections.   Nasacort 1 spray BID which has been helping more and no more epistaxis. Using saline gel at night.  Takes Singulair at night.  Stopped zyrtec as she was feeling tired during the daytime but she is also having difficulty falling asleep. Scheduled to see sleep medicine to help with the sleeping.   Assessment and Plan: Kristie Owens is a 65 y.o. female with: Seasonal and perennial allergic rhinoconjunctivitis Past history - Perennial rhinoconjunctivitis symptoms for many years but worse the last 2.  Patient was on allergy immunotherapy for 5 years in the 74s with good benefit.  Status post sinus surgery in the 1990s.  Possibly PCP saw polyps 1 year ago. 2021 skin testing showed: Positive to grass, tree, weed pollen, ragweed, cockroach and dust mites. Started AIT on 03/01/2020 (Dm- Cr & G-Rw-W-T). Interim history -  Some localized reactions with AIT. Epistaxis resolved with stopping dymista. Continue environmental control measures as below. Use over the counter antihistamines such as Zyrtec (cetirizine), Claritin (loratadine), Allegra (fexofenadine), or Xyzal  (levocetirizine) daily as needed. May take twice a day during allergy flares. May switch antihistamines every few months. Continue Singulair (montelukast) 10mg  daily at night. Use olopatadine eye drops 0.2% once a day as needed for itchy/watery eyes. Continue allergy injections - given today. Will discuss at next visit whether to continue or stop as she has not noted any significant changes in symptoms.  Use Nasacort (triamcinolone) nasal spray 1 spray per nostril twice a day as needed for nasal congestion.  Nasal saline spray (i.e., Simply Saline) or nasal saline lavage (i.e., NeilMed) is recommended as needed and prior to medicated nasal sprays. Use saline gel to keep the nares moisturized during the winter months.   Return in about 6 months (around 06/12/2021).  No orders of the defined types were placed in this encounter.  Lab Orders  No laboratory test(s) ordered today    Diagnostics: None.  Medication List:  Current Outpatient Medications  Medication Sig Dispense Refill   amLODipine (NORVASC) 5 MG tablet Take 1 tablet (5 mg total) by mouth daily. 90 tablet 1   aspirin EC 81 MG tablet Take 81 mg by mouth daily.     carvedilol (COREG) 6.25 MG tablet Take 1 tablet (6.25 mg total) by mouth 2 (two) times daily with a meal. 180 tablet 1   cetirizine (ZYRTEC ALLERGY) 10 MG tablet Take 1 tablet (10 mg total) by mouth daily. 30 tablet 5   EPINEPHrine 0.3 mg/0.3 mL IJ SOAJ injection Inject 0.3 mg into the muscle as needed for anaphylaxis. 0.3 mL 2   isosorbide mononitrate (IMDUR) 60 MG 24 hr tablet TAKE 1.5 TABLETS (90 MG TOTAL) BY MOUTH DAILY.  135 tablet 0   lisinopril-hydrochlorothiazide (ZESTORETIC) 20-12.5 MG tablet Take 1 tablet by mouth daily. 90 tablet 1   meloxicam (MOBIC) 15 MG tablet Take 1 tablet (15 mg total) by mouth daily. 90 tablet 1   metFORMIN (GLUCOPHAGE) 500 MG tablet Take 1 tablet (500 mg total) by mouth daily with breakfast. 90 tablet 1   montelukast (SINGULAIR) 10 MG  tablet Take 1 tablet (10 mg total) by mouth at bedtime. 90 tablet 1   nitroGLYCERIN (NITROSTAT) 0.4 MG SL tablet Place 1 tablet (0.4 mg total) under the tongue every 5 (five) minutes as needed for chest pain. 25 tablet 3   Olopatadine HCl (PATADAY OP) Place 1 drop into both eyes daily as needed (for itching or dryness).     omeprazole (PRILOSEC OTC) 20 MG tablet Take 20 mg by mouth daily as needed (acid reflux).     rosuvastatin (CRESTOR) 20 MG tablet Take 1 tablet (20 mg total) by mouth daily. 90 tablet 3   sertraline (ZOLOFT) 100 MG tablet Take 1 tablet (100 mg total) by mouth at bedtime. 90 tablet 1   triamcinolone (NASACORT) 55 MCG/ACT AERO nasal inhaler Place 1 spray into the nose 2 (two) times daily as needed (nasal congestion). 1 each 5   mirtazapine (REMERON) 15 MG tablet Take 1 tablet (15 mg total) by mouth at bedtime. 90 tablet 1   No current facility-administered medications for this visit.   Allergies: Allergies  Allergen Reactions   Bupropion Nausea Only and Other (See Comments)    Nausea and headaches    Sulfa Antibiotics Other (See Comments)    From childhood- reaction not recalled   Prednisone Anxiety and Other (See Comments)    "Made me not feel like myself, altered"   I reviewed her past medical history, social history, family history, and environmental history and no significant changes have been reported from her previous visit.  Review of Systems  Constitutional:  Negative for appetite change, chills, fever and unexpected weight change.  HENT:  Positive for congestion, postnasal drip and sneezing. Negative for nosebleeds.   Eyes:  Positive for itching.  Respiratory:  Negative for cough, chest tightness, shortness of breath and wheezing.   Cardiovascular:  Negative for chest pain.  Gastrointestinal:  Positive for constipation. Negative for abdominal pain.  Genitourinary:  Negative for difficulty urinating.  Skin:  Negative for rash.  Allergic/Immunologic: Positive  for environmental allergies.  Neurological:  Negative for headaches.   Objective: BP 124/76   Pulse 64   Temp 98 F (36.7 C)   Resp 16   Ht 5\' 4"  (1.626 m)   Wt 204 lb 12.8 oz (92.9 kg)   SpO2 95%   BMI 35.15 kg/m  Body mass index is 35.15 kg/m. Physical Exam Vitals and nursing note reviewed.  Constitutional:      Appearance: Normal appearance. She is well-developed.  HENT:     Head: Normocephalic and atraumatic.     Right Ear: Tympanic membrane and external ear normal.     Left Ear: Tympanic membrane and external ear normal.     Nose:     Comments: Dried blood on the septum on the right side.    Mouth/Throat:     Mouth: Mucous membranes are moist.     Pharynx: Oropharynx is clear.  Eyes:     Conjunctiva/sclera: Conjunctivae normal.  Cardiovascular:     Rate and Rhythm: Normal rate and regular rhythm.     Heart sounds: Normal heart sounds. No murmur  heard.   No friction rub. No gallop.  Pulmonary:     Effort: Pulmonary effort is normal.     Breath sounds: Normal breath sounds. No wheezing, rhonchi or rales.  Musculoskeletal:     Cervical back: Neck supple.  Skin:    General: Skin is warm.     Findings: No rash.  Neurological:     Mental Status: She is alert and oriented to person, place, and time.  Psychiatric:        Behavior: Behavior normal.   Previous notes and tests were reviewed. The plan was reviewed with the patient/family, and all questions/concerned were addressed.  It was my pleasure to see Kabella today and participate in her care. Please feel free to contact me with any questions or concerns.  Sincerely,  Wyline Mood, DO Allergy & Immunology  [facility] office: [phone] [facility] office: [phone]

## 2020-12-13 NOTE — Patient Instructions (Addendum)
Environmental allergies Past skin testing showed: Positive to grass, tree, weed pollen, ragweed, cockroach and dust mites. Continue environmental control measures as below. Use over the counter antihistamines such as Zyrtec (cetirizine), Claritin (loratadine), Allegra (fexofenadine), or Xyzal (levocetirizine) daily as needed. May take twice a day during allergy flares. May switch antihistamines every few months.  Continue Singulair (montelukast) 10mg  daily at night. Use olopatadine eye drops 0.2% once a day as needed for itchy/watery eyes. Continue allergy injections - given today. Use Nasacort (triamcinolone) nasal spray 1 spray per nostril twice a day as needed for nasal congestion.  Nasal saline spray (i.e., Simply Saline) or nasal saline lavage (i.e., NeilMed) is recommended as needed and prior to medicated nasal sprays. Use saline gel to keep the nose moisturized during the winter months.   Follow up in 6 months or sooner if needed.

## 2020-12-13 NOTE — Assessment & Plan Note (Signed)
Past history - Perennial rhinoconjunctivitis symptoms for many years but worse the last 2.  Patient was on allergy immunotherapy for 5 years in the 48s with good benefit.  Status post sinus surgery in the 1990s.  Possibly PCP saw polyps 1 year ago. 2021 skin testing showed: Positive to grass, tree, weed pollen, ragweed, cockroach and dust mites. Started AIT on 03/01/2020 (Dm- Cr & G-Rw-W-T). Interim history -  Some localized reactions with AIT. Epistaxis resolved with stopping dymista.  Continue environmental control measures as below. . Use over the counter antihistamines such as Zyrtec (cetirizine), Claritin (loratadine), Allegra (fexofenadine), or Xyzal (levocetirizine) daily as needed. May take twice a day during allergy flares. May switch antihistamines every few months.  Continue Singulair (montelukast) 10mg  daily at night. . Use olopatadine eye drops 0.2% once a day as needed for itchy/watery eyes.  Continue allergy injections - given today.  Will discuss at next visit whether to continue or stop as she has not noted any significant changes in symptoms.  . Use Nasacort (triamcinolone) nasal spray 1 spray per nostril twice a day as needed for nasal congestion.  . Nasal saline spray (i.e., Simply Saline) or nasal saline lavage (i.e., NeilMed) is recommended as needed and prior to medicated nasal sprays. . Use saline gel to keep the nares moisturized during the winter months.

## 2020-12-20 ENCOUNTER — Other Ambulatory Visit: Payer: Self-pay

## 2020-12-20 ENCOUNTER — Ambulatory Visit (INDEPENDENT_AMBULATORY_CARE_PROVIDER_SITE_OTHER): Payer: Managed Care, Other (non HMO)

## 2020-12-20 DIAGNOSIS — J309 Allergic rhinitis, unspecified: Secondary | ICD-10-CM | POA: Diagnosis not present

## 2020-12-22 ENCOUNTER — Other Ambulatory Visit: Payer: Self-pay | Admitting: Cardiology

## 2020-12-27 ENCOUNTER — Ambulatory Visit (INDEPENDENT_AMBULATORY_CARE_PROVIDER_SITE_OTHER): Payer: Managed Care, Other (non HMO)

## 2020-12-27 ENCOUNTER — Other Ambulatory Visit: Payer: Self-pay

## 2020-12-27 DIAGNOSIS — J309 Allergic rhinitis, unspecified: Secondary | ICD-10-CM

## 2020-12-28 ENCOUNTER — Encounter: Payer: Self-pay | Admitting: Cardiology

## 2020-12-28 ENCOUNTER — Ambulatory Visit (INDEPENDENT_AMBULATORY_CARE_PROVIDER_SITE_OTHER): Payer: Managed Care, Other (non HMO) | Admitting: Cardiology

## 2020-12-28 ENCOUNTER — Other Ambulatory Visit: Payer: Self-pay

## 2020-12-28 DIAGNOSIS — I10 Essential (primary) hypertension: Secondary | ICD-10-CM

## 2020-12-28 DIAGNOSIS — I25119 Atherosclerotic heart disease of native coronary artery with unspecified angina pectoris: Secondary | ICD-10-CM

## 2020-12-28 DIAGNOSIS — E782 Mixed hyperlipidemia: Secondary | ICD-10-CM

## 2020-12-28 NOTE — Patient Instructions (Signed)
Medication Instructions:  Your physician recommends that you continue on your current medications as directed. Please refer to the Current Medication list given to you today.  *If you need a refill on your cardiac medications before your next appointment, please call your pharmacy*   Lab Work: None ordered  If you have labs (blood work) drawn today and your tests are completely normal, you will receive your results only by: MyChart Message (if you have MyChart) OR A paper copy in the mail If you have any lab test that is abnormal or we need to change your treatment, we will call you to review the results.   Testing/Procedures: None ordered   Follow-Up: At [facility], you and your health needs are our priority.  As part of our continuing mission to provide you with exceptional heart care, we have created designated Provider Care Teams.  These Care Teams include your primary Cardiologist (physician) and Advanced Practice Providers (APPs -  Physician Assistants and Nurse Practitioners) who all work together to provide you with the care you need, when you need it.  We recommend signing up for the patient portal called "MyChart".  Sign up information is provided on this After Visit Summary.  MyChart is used to connect with patients for Virtual Visits (Telemedicine).  Patients are able to view lab/test results, encounter notes, upcoming appointments, etc.  Non-urgent messages can be sent to your provider as well.   To learn more about what you can do with MyChart, go to [email or web address].    Your next appointment:   12 month(s)  The format for your next appointment:   In Person  Provider:   Mark Skains, MD     Other Instructions   

## 2020-12-28 NOTE — Assessment & Plan Note (Signed)
Currently taking Crestor 20 mg a day high intensity statin.  Has known coronary artery disease as described above.  LDL 56.  Excellent.  No myalgias.  ALT 14 from outside labs.  Hemoglobin 12.6.

## 2020-12-28 NOTE — Progress Notes (Signed)
Cardiology Office Note:    Date:  12/28/2020   ID:  Kristie Owens, DOB 20-Jul-1955, MRN [id number]  PCP:  Natalia Leatherwood, DO  [facility] Cardiologist:  Donato Schultz, MD  [facility] Electrophysiologist:  None   Referring MD: Natalia Leatherwood, DO   History of Present Illness:    Kristie Owens is a 65 y.o. female here for the follow-up of hypertension and coronary artery disease.  Cardiac catheterization reviewed as below.  Still felt about the same at her last appointment.  Continued to work with medications.  Exercise.  Did feel some nerve sensation over the catheterization site.  Small nodular scarring noted.  Good overall radial pulse.  Today: Overall she is feeling good, and has no new cardiovascular complaints.  Currently she is taking 90 mg of Imdur.  She tries to stay active regularly, but is sometimes limited by the hot weather or chronic back pain. Generally she will complete her exercise even with stopping to rest a few times if needed.  She denies any palpitations, chest pain, or shortness of breath. No lightheadedness, headaches, syncope, orthopnea, PND, or lower extremity edema.  Past Medical History:  Diagnosis Date   Achilles bursitis or tendinitis 11/11/2012   right    Allergy    Angina at rest [facility])    Dr Anne Fu   CAD (coronary artery disease), native coronary artery    DES to LAD in 2 areas, one at the bifurcation of a diagonal branch and also in the mid to distal region. Her diagonal branch was also ballooned . She has a DES to RCA 10/10. Her circumflex artery mild ostial disease.   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    Tonsillar calculus 08/12/2018    Past Surgical History:  Procedure Laterality Date   BREAST EXCISIONAL BIOPSY Right 1995   CAROTID STENT     CESAREAN SECTION  1983   FOOT SURGERY     LEFT HEART CATH AND CORONARY ANGIOGRAPHY N/A 06/04/2016   Procedure: Left Heart Cath and Coronary Angiography;  Surgeon: Lyn Records, MD;  Location: [facility] INVASIVE CV LAB;  Service: Cardiovascular;  Laterality: N/A;   LEFT HEART CATH AND CORONARY ANGIOGRAPHY N/A 12/04/2019   Procedure: LEFT HEART CATH AND CORONARY ANGIOGRAPHY;  Surgeon: Kathleene Hazel, MD;  Location: [facility] INVASIVE CV LAB;  Service: Cardiovascular;  Laterality: N/A;   LEFT HEART CATHETERIZATION WITH CORONARY ANGIOGRAM N/A 01/16/2011   Procedure: LEFT HEART CATHETERIZATION WITH CORONARY ANGIOGRAM;  Surgeon: Donato Schultz, MD;  Location: [facility] CATH LAB;  Service: Cardiovascular;  Laterality: N/A;   NOSE SURGERY     SINOSCOPY      Current Medications: Current Meds  Medication Sig   amLODipine (NORVASC) 5 MG tablet Take 1 tablet (5 mg total) by mouth daily.   aspirin EC 81 MG tablet Take 81 mg by mouth daily.   BELSOMRA 10 MG TABS Take 1 tablet by mouth at bedtime as needed.  carvedilol (COREG) 6.25 MG tablet Take 1 tablet (6.25 mg total) by mouth 2 (two) times daily with a meal.   cetirizine (ZYRTEC ALLERGY) 10 MG tablet Take 1 tablet (10 mg total) by mouth daily.   EPINEPHrine 0.3 mg/0.3 mL IJ SOAJ injection Inject 0.3 mg into the muscle as needed for anaphylaxis.   isosorbide mononitrate (IMDUR) 60 MG 24 hr tablet TAKE 1.5 TABLETS (90 MG TOTAL) BY MOUTH DAILY. *DUE FOR APPOINTMENT BEFORE FURTHER REFILLS*   lisinopril-hydrochlorothiazide (ZESTORETIC) 20-12.5 MG tablet Take 1 tablet by mouth daily.   meloxicam (MOBIC) 15 MG tablet Take 1 tablet (15 mg total) by mouth daily.   metFORMIN (GLUCOPHAGE) 500 MG tablet Take 1 tablet (500 mg total) by mouth daily with  breakfast.   mirtazapine (REMERON) 15 MG tablet Take 1 tablet (15 mg total) by mouth at bedtime.   montelukast (SINGULAIR) 10 MG tablet Take 1 tablet (10 mg total) by mouth at bedtime.   nitroGLYCERIN (NITROSTAT) 0.4 MG SL tablet Place 1 tablet (0.4 mg total) under the tongue every 5 (five) minutes as needed for chest pain.   Olopatadine HCl (PATADAY OP) Place 1 drop into both eyes daily as needed (for itching or dryness).   omeprazole (PRILOSEC OTC) 20 MG tablet Take 20 mg by mouth daily as needed (acid reflux).   rosuvastatin (CRESTOR) 20 MG tablet Take 1 tablet (20 mg total) by mouth daily.   sertraline (ZOLOFT) 100 MG tablet Take 1 tablet (100 mg total) by mouth at bedtime.   triamcinolone (NASACORT) 55 MCG/ACT AERO nasal inhaler Place 1 spray into the nose 2 (two) times daily as needed (nasal congestion).     Allergies:   Bupropion, Sulfa antibiotics, and Prednisone   Social History   Socioeconomic History   Marital status: Married    Spouse name: Not on file   Number of children: Not on file   Years of education: Not on file   Highest education level: Not on file  Occupational History   Not on file  Tobacco Use   Smoking status: Never   Smokeless tobacco: Never  Vaping Use   Vaping Use: Never used  Substance and Sexual Activity   Alcohol use: Not Currently   Drug use: Never   Sexual activity: Yes    Partners: Male    Comment: husband  Other Topics Concern   Not on file  Social History Narrative   Marital status/children/pets: Married.  One child.   Education/employment: Retired.  Associates degree in college.   Safety:      -Wears a bicycle helmet riding a bike: Yes     -smoke alarm in the home:Yes     - wears seatbelt: Yes     - Feels safe in their relationships: Yes   Social Determinants of Health   Financial Resource Strain: Not on file  Food Insecurity: Not on file  Transportation Needs: Not on file  Physical Activity: Not on file  Stress: Not on file   Social Connections: Not on file     Family History: The patient's family history includes Alcohol abuse in her brother; Arthritis in her sister; Asthma in her brother; COPD in her mother; Depression in her sister; Diabetes in her sister; Early death in her maternal grandfather and maternal grandmother; Early death (age of onset: 54) in her brother; Heart attack in her brother, paternal grandmother, and another family member; Heart disease in her brother, mother, paternal grandfather, and paternal grandmother; Heart failure in her paternal grandmother;  Hyperlipidemia in her brother, brother, paternal grandfather, and paternal grandmother; Hypertension in her brother, brother, mother, paternal grandfather, paternal grandmother, and sister; Intellectual disability in her brother and brother; Kidney disease in her brother and brother; Lung cancer in her father and maternal grandfather; Miscarriages / [place] in her sister; Suicidality in her maternal grandfather; Tuberculosis in her maternal grandmother; Ulcers in her mother. There is no history of Stroke.  ROS:   Please see the history of present illness.    (+) Chronic back pain All other systems reviewed and are negative.  EKGs/Labs/Other Studies Reviewed:    The following studies were reviewed today:  CATH 12/04/19: Dist LAD lesion is 25% stenosed. Ost 1st Diag lesion is 40% stenosed. 1st Diag lesion is 25% stenosed. Ramus lesion is 45% stenosed. Ost 1st Mrg to 1st Mrg lesion is 50% stenosed. RPDA lesion is 40% stenosed. Previously placed Prox RCA to Mid RCA stent (unknown type) is widely patent. Prox RCA lesion is 40% stenosed. RPAV lesion is 40% stenosed. Ost Cx to Prox Cx lesion is 40% stenosed. Previously placed Prox LAD to Mid LAD stent (unknown type) is widely patent. Previously placed Mid LAD stent (unknown type) is widely patent. The left ventricular systolic function is normal. LV end diastolic pressure is normal. The left  ventricular ejection fraction is greater than 65% by visual estimate. There is no mitral valve regurgitation.   1. Patent proximal and mid LAD stents 2. Small caliber circumflex with mild to moderate non-obstructive disease 3. Large dominant RCA with patent mid stent, mild proximal stenosis. Moderate disease in the PDA and posterolateral artery, unchanged from last cath.  4. Normal LV systolic function, LVEF=65-70%. LVEDP 16   Recommendations: Continue medical management of CAD.   Diagnostic Dominance: Right   Echocardiogram 12/07/2019:  1. Left ventricular ejection fraction, by estimation, is 65 to 70%. The  left ventricle has normal function. The left ventricle has no regional  wall motion abnormalities. There is mild left ventricular hypertrophy.  Left ventricular diastolic parameters  are indeterminate.   2. Right ventricular systolic function is normal. The right ventricular  size is normal. Tricuspid regurgitation signal is inadequate for assessing  PA pressure.   3. The mitral valve is normal in structure. No evidence of mitral valve  regurgitation.   4. The aortic valve was not well visualized. Aortic valve regurgitation  is not visualized. Mild aortic valve sclerosis is present, with no  evidence of aortic valve stenosis.   Nuclear Stress 09/12/2018: Nuclear stress EF: 70%. There was no ST segment deviation noted during stress. The study is normal. The left ventricular ejection fraction is hyperdynamic (>65%).   Normal resting and stress perfusion. No ischemia or infarction EF 70%  EKG:  EKG is personally reviewed and interpreted. 12/28/2020: Sinus rhythm. Rate 72 bpm. 12/18/2019: sinus rhythm 68 no other abnormalities  Recent Labs: 12/30/2019: BUN 18; Creatinine, Ser 0.64; Hemoglobin 12.6; Platelets 365.0; Potassium 4.2; Sodium 138; TSH 3.22 04/11/2020: ALT 14   Recent Lipid Panel    Component Value Date/Time   CHOL 109 04/11/2020 0947   TRIG 138 04/11/2020  0947   HDL 29 (L) 04/11/2020 0947   CHOLHDL 3.8 04/11/2020 0947   CHOLHDL 4 12/30/2019 0859   VLDL 31.8 12/30/2019 0859   LDLCALC 56 04/11/2020 0947     Risk Assessment/Calculations:       Physical Exam:    VS:  BP 110/64   Pulse 72   Ht 5\' 4"  (1.626 m)   Wt  204 lb (92.5 kg)   SpO2 96%   BMI 35.02 kg/m     Wt Readings from Last 3 Encounters:  12/28/20 204 lb (92.5 kg)  12/13/20 204 lb 12.8 oz (92.9 kg)  11/21/20 202 lb (91.6 kg)    GEN:  Well nourished, well developed in no acute distress HEENT: Normal NECK: No JVD; No carotid bruits LYMPHATICS: No lymphadenopathy CARDIAC: RRR, no murmurs, rubs, gallops RESPIRATORY:  Clear to auscultation without rales, wheezing or rhonchi  ABDOMEN: Soft, non-tender, non-distended MUSCULOSKELETAL:  No edema; No deformity  SKIN: Warm and dry NEUROLOGIC:  Alert and oriented x 3 PSYCHIATRIC:  Normal affect   ASSESSMENT:    1. Atherosclerosis of native coronary artery of native heart with angina pectoris (HCC)   2. Essential hypertension   3. Mixed hyperlipidemia     PLAN:    In order of problems listed above:  Coronary atherosclerosis of native coronary artery Prior cardiac catheterization reviewed.  Continue with aggressive medical management.  Prior radial artery site small nodule scarring.  Improved.  Continuing with aspirin 81 mg amlodipine 5 mg a day isosorbide 90 mg a day. Some trouble in the heat. Trying to stay active, Back pain.   Essential hypertension Currently excellently controlled blood pressure.  Amlodipine 5 mg a day.  Lisinopril hydrochlorothiazide 20/12.5 mg a day.  Refills as needed.  Mixed hyperlipidemia Currently taking Crestor 20 mg a day high intensity statin.  Has known coronary artery disease as described above.  LDL 56.  Excellent.  No myalgias.  ALT 14 from outside labs.  Hemoglobin 12.6.   Shared Decision Making/Informed Consent      Follow-up: 1 year.  Medication Adjustments/Labs and Tests  Ordered: Current medicines are reviewed at length with the patient today.  Concerns regarding medicines are outlined above.   Orders Placed This Encounter  Procedures   EKG 12-Lead    No orders of the defined types were placed in this encounter.  Patient Instructions  Medication Instructions:  Your physician recommends that you continue on your current medications as directed. Please refer to the Current Medication list given to you today.  *If you need a refill on your cardiac medications before your next appointment, please call your pharmacy*   Lab Work: None ordered  If you have labs (blood work) drawn today and your tests are completely normal, you will receive your results only by: MyChart Message (if you have MyChart) OR A paper copy in the mail If you have any lab test that is abnormal or we need to change your treatment, we will call you to review the results.   Testing/Procedures: None ordered   Follow-Up: At [facility], you and your health needs are our priority.  As part of our continuing mission to provide you with exceptional heart care, we have created designated Provider Care Teams.  These Care Teams include your primary Cardiologist (physician) and Advanced Practice Providers (APPs -  Physician Assistants and Nurse Practitioners) who all work together to provide you with the care you need, when you need it.  We recommend signing up for the patient portal called "MyChart".  Sign up information is provided on this After Visit Summary.  MyChart is used to connect with patients for Virtual Visits (Telemedicine).  Patients are able to view lab/test results, encounter notes, upcoming appointments, etc.  Non-urgent messages can be sent to your provider as well.   To learn more about what you can do with MyChart, go to [email or web address].  Your next appointment:   12 month(s)  The format for your next appointment:   In Person  Provider:   Donato Schultz, MD      Other Instructions    I,Mathew Stumpf,acting as a scribe for Donato Schultz, MD.,have documented all relevant documentation on the behalf of Donato Schultz, MD,as directed by  Donato Schultz, MD while in the presence of Donato Schultz, MD.  I, Donato Schultz, MD, have reviewed all documentation for this visit. The documentation on 12/28/20 for the exam, diagnosis, procedures, and orders are all accurate and complete.   Signed, Donato Schultz, MD  12/28/2020 10:02 AM     [facility]

## 2020-12-28 NOTE — Assessment & Plan Note (Signed)
Currently excellently controlled blood pressure.  Amlodipine 5 mg a day.  Lisinopril hydrochlorothiazide 20/12.5 mg a day.  Refills as needed.

## 2020-12-28 NOTE — Assessment & Plan Note (Addendum)
Prior cardiac catheterization reviewed.  Continue with aggressive medical management.  Prior radial artery site small nodule scarring.  Improved.  Continuing with aspirin 81 mg amlodipine 5 mg a day isosorbide 90 mg a day. Some trouble in the heat. Trying to stay active, Back pain.

## 2020-12-30 ENCOUNTER — Encounter: Payer: Managed Care, Other (non HMO) | Admitting: Family Medicine

## 2021-01-03 DIAGNOSIS — J3089 Other allergic rhinitis: Secondary | ICD-10-CM

## 2021-01-03 NOTE — Progress Notes (Signed)
VIALS MADE. EXP 01-03-22 

## 2021-01-10 ENCOUNTER — Ambulatory Visit (INDEPENDENT_AMBULATORY_CARE_PROVIDER_SITE_OTHER): Payer: Managed Care, Other (non HMO)

## 2021-01-10 ENCOUNTER — Other Ambulatory Visit: Payer: Self-pay

## 2021-01-10 DIAGNOSIS — J309 Allergic rhinitis, unspecified: Secondary | ICD-10-CM | POA: Diagnosis not present

## 2021-01-24 ENCOUNTER — Ambulatory Visit (INDEPENDENT_AMBULATORY_CARE_PROVIDER_SITE_OTHER): Payer: Managed Care, Other (non HMO)

## 2021-01-24 ENCOUNTER — Other Ambulatory Visit: Payer: Self-pay

## 2021-01-24 DIAGNOSIS — J309 Allergic rhinitis, unspecified: Secondary | ICD-10-CM | POA: Diagnosis not present

## 2021-02-07 ENCOUNTER — Other Ambulatory Visit: Payer: Self-pay

## 2021-02-07 ENCOUNTER — Ambulatory Visit (INDEPENDENT_AMBULATORY_CARE_PROVIDER_SITE_OTHER): Payer: Managed Care, Other (non HMO)

## 2021-02-07 DIAGNOSIS — J309 Allergic rhinitis, unspecified: Secondary | ICD-10-CM

## 2021-02-21 ENCOUNTER — Ambulatory Visit (INDEPENDENT_AMBULATORY_CARE_PROVIDER_SITE_OTHER): Payer: Managed Care, Other (non HMO) | Admitting: *Deleted

## 2021-02-21 ENCOUNTER — Other Ambulatory Visit: Payer: Self-pay

## 2021-02-21 DIAGNOSIS — J309 Allergic rhinitis, unspecified: Secondary | ICD-10-CM | POA: Diagnosis not present

## 2021-03-07 ENCOUNTER — Ambulatory Visit (INDEPENDENT_AMBULATORY_CARE_PROVIDER_SITE_OTHER): Payer: Managed Care, Other (non HMO)

## 2021-03-07 ENCOUNTER — Other Ambulatory Visit: Payer: Self-pay

## 2021-03-07 DIAGNOSIS — J309 Allergic rhinitis, unspecified: Secondary | ICD-10-CM | POA: Diagnosis not present

## 2021-03-10 ENCOUNTER — Encounter: Payer: Managed Care, Other (non HMO) | Admitting: Family Medicine

## 2021-03-10 ENCOUNTER — Other Ambulatory Visit: Payer: Self-pay

## 2021-03-13 ENCOUNTER — Encounter: Payer: Self-pay | Admitting: Family Medicine

## 2021-03-13 ENCOUNTER — Ambulatory Visit (INDEPENDENT_AMBULATORY_CARE_PROVIDER_SITE_OTHER): Payer: Managed Care, Other (non HMO) | Admitting: Family Medicine

## 2021-03-13 ENCOUNTER — Other Ambulatory Visit: Payer: Self-pay

## 2021-03-13 VITALS — BP 128/66 | HR 57 | Temp 98.4°F | Ht 63.5 in | Wt 207.0 lb

## 2021-03-13 DIAGNOSIS — I25119 Atherosclerotic heart disease of native coronary artery with unspecified angina pectoris: Secondary | ICD-10-CM | POA: Diagnosis not present

## 2021-03-13 DIAGNOSIS — Z1231 Encounter for screening mammogram for malignant neoplasm of breast: Secondary | ICD-10-CM

## 2021-03-13 DIAGNOSIS — J302 Other seasonal allergic rhinitis: Secondary | ICD-10-CM

## 2021-03-13 DIAGNOSIS — E785 Hyperlipidemia, unspecified: Secondary | ICD-10-CM

## 2021-03-13 DIAGNOSIS — I200 Unstable angina: Secondary | ICD-10-CM | POA: Diagnosis not present

## 2021-03-13 DIAGNOSIS — Z0000 Encounter for general adult medical examination without abnormal findings: Secondary | ICD-10-CM

## 2021-03-13 DIAGNOSIS — I10 Essential (primary) hypertension: Secondary | ICD-10-CM

## 2021-03-13 DIAGNOSIS — E782 Mixed hyperlipidemia: Secondary | ICD-10-CM

## 2021-03-13 DIAGNOSIS — F3340 Major depressive disorder, recurrent, in remission, unspecified: Secondary | ICD-10-CM

## 2021-03-13 DIAGNOSIS — E1169 Type 2 diabetes mellitus with other specified complication: Secondary | ICD-10-CM | POA: Diagnosis not present

## 2021-03-13 DIAGNOSIS — Z955 Presence of coronary angioplasty implant and graft: Secondary | ICD-10-CM

## 2021-03-13 DIAGNOSIS — M8580 Other specified disorders of bone density and structure, unspecified site: Secondary | ICD-10-CM | POA: Insufficient documentation

## 2021-03-13 DIAGNOSIS — H1010 Acute atopic conjunctivitis, unspecified eye: Secondary | ICD-10-CM

## 2021-03-13 DIAGNOSIS — J3089 Other allergic rhinitis: Secondary | ICD-10-CM

## 2021-03-13 LAB — CBC
HCT: 36.3 % (ref 36.0–46.0)
Hemoglobin: 11.8 g/dL — ABNORMAL LOW (ref 12.0–15.0)
MCHC: 32.6 g/dL (ref 30.0–36.0)
MCV: 81.2 fl (ref 78.0–100.0)
Platelets: 359 10*3/uL (ref 150.0–400.0)
RBC: 4.46 Mil/uL (ref 3.87–5.11)
RDW: 15.3 % (ref 11.5–15.5)
WBC: 4.5 10*3/uL (ref 4.0–10.5)

## 2021-03-13 LAB — TSH: TSH: 2.52 u[IU]/mL (ref 0.35–5.50)

## 2021-03-13 LAB — HEMOGLOBIN A1C: Hgb A1c MFr Bld: 6.6 % — ABNORMAL HIGH (ref 4.6–6.5)

## 2021-03-13 LAB — VITAMIN D 25 HYDROXY (VIT D DEFICIENCY, FRACTURES): VITD: 19.59 ng/mL — ABNORMAL LOW (ref 30.00–100.00)

## 2021-03-13 MED ORDER — GABAPENTIN 300 MG PO CAPS: 300.0000 mg | ORAL_CAPSULE | Freq: Every day | ORAL | 1 refills | Status: DC

## 2021-03-13 MED ORDER — CARVEDILOL 6.25 MG PO TABS: 6.2500 mg | ORAL_TABLET | Freq: Two times a day (BID) | ORAL | 1 refills | Status: DC

## 2021-03-13 MED ORDER — METFORMIN HCL 500 MG PO TABS
500.0000 mg | ORAL_TABLET | Freq: Every day | ORAL | 1 refills | Status: DC
Start: 2021-03-13 — End: 2021-08-25

## 2021-03-13 MED ORDER — ROSUVASTATIN CALCIUM 20 MG PO TABS: 20.0000 mg | ORAL_TABLET | Freq: Every day | ORAL | 3 refills | Status: DC

## 2021-03-13 MED ORDER — LISINOPRIL-HYDROCHLOROTHIAZIDE 20-12.5 MG PO TABS
1.0000 | ORAL_TABLET | Freq: Every day | ORAL | 1 refills | Status: DC
Start: 2021-03-13 — End: 2021-08-25

## 2021-03-13 MED ORDER — MONTELUKAST SODIUM 10 MG PO TABS: 10.0000 mg | ORAL_TABLET | Freq: Every day | ORAL | 1 refills | Status: DC

## 2021-03-13 MED ORDER — SERTRALINE HCL 100 MG PO TABS: 100.0000 mg | ORAL_TABLET | Freq: Every evening | ORAL | 1 refills | Status: DC

## 2021-03-13 MED ORDER — MELOXICAM 15 MG PO TABS: 15.0000 mg | ORAL_TABLET | Freq: Every day | ORAL | 1 refills | Status: DC

## 2021-03-13 MED ORDER — AMLODIPINE BESYLATE 5 MG PO TABS: 5.0000 mg | ORAL_TABLET | Freq: Every day | ORAL | 1 refills | Status: DC

## 2021-03-13 NOTE — Patient Instructions (Signed)
°Great to see you today.  °I have refilled the medication(s) we provide.  ° °If labs were collected, we will inform you of lab results once received either by echart message or telephone call.  ° - echart message- for normal results that have been seen by the patient already.  ° - telephone call: abnormal results or if patient has not viewed results in their echart. °Health Maintenance After Age 68 °After age 68, you are at a higher risk for certain long-term diseases and infections as well as injuries from falls. Falls are a major cause of broken bones and head injuries in people who are older than age 68. Getting regular preventive care can help to keep you healthy and well. Preventive care includes getting regular testing and making lifestyle changes as recommended by your health care provider. Talk with your health care provider about: °Which screenings and tests you should have. A screening is a test that checks for a disease when you have no symptoms. °A diet and exercise plan that is right for you. °What should I know about screenings and tests to prevent falls? °Screening and testing are the best ways to find a health problem early. Early diagnosis and treatment give you the best chance of managing medical conditions that are common after age 68. Certain conditions and lifestyle choices may make you more likely to have a fall. Your health care provider may recommend: °Regular vision checks. Poor vision and conditions such as cataracts can make you more likely to have a fall. If you wear glasses, make sure to get your prescription updated if your vision changes. °Medicine review. Work with your health care provider to regularly review all of the medicines you are taking, including over-the-counter medicines. Ask your health care provider about any side effects that may make you more likely to have a fall. Tell your health care provider if any medicines that you take make you feel dizzy or sleepy. °Strength  and balance checks. Your health care provider may recommend certain tests to check your strength and balance while standing, walking, or changing positions. °Foot health exam. Foot pain and numbness, as well as not wearing proper footwear, can make you more likely to have a fall. °Screenings, including: °Osteoporosis screening. Osteoporosis is a condition that causes the bones to get weaker and break more easily. °Blood pressure screening. Blood pressure changes and medicines to control blood pressure can make you feel dizzy. °Depression screening. You may be more likely to have a fall if you have a fear of falling, feel depressed, or feel unable to do activities that you used to do. °Alcohol use screening. Using too much alcohol can affect your balance and may make you more likely to have a fall. °Follow these instructions at home: °Lifestyle °Do not drink alcohol if: °Your health care provider tells you not to drink. °If you drink alcohol: °Limit how much you have to: °0-1 drink a day for women. °0-2 drinks a day for men. °Know how much alcohol is in your drink. In the [place] one drink equals one 12 oz bottle of beer (355 mL), one 5 oz glass of wine (148 mL), or one 1½ oz glass of hard liquor (44 mL). °Do not use any products that contain nicotine or tobacco. These products include cigarettes, chewing tobacco, and vaping devices, such as e-cigarettes. If you need help quitting, ask your health care provider. °Activity ° °Follow a regular exercise program to stay fit. This will help you maintain   your balance. Ask your health care provider what types of exercise are appropriate for you. °If you need a cane or walker, use it as recommended by your health care provider. °Wear supportive shoes that have nonskid soles. °Safety ° °Remove any tripping hazards, such as rugs, cords, and clutter. °Install safety equipment such as grab bars in bathrooms and safety rails on stairs. °Keep rooms and walkways well-lit. °General  instructions °Talk with your health care provider about your risks for falling. Tell your health care provider if: °You fall. Be sure to tell your health care provider about all falls, even ones that seem minor. °You feel dizzy, tiredness (fatigue), or off-balance. °Take over-the-counter and prescription medicines only as told by your health care provider. These include supplements. °Eat a healthy diet and maintain a healthy weight. A healthy diet includes low-fat dairy products, low-fat (lean) meats, and fiber from whole grains, beans, and lots of fruits and vegetables. °Stay current with your vaccines. °Schedule regular health, dental, and eye exams. °Summary °Having a healthy lifestyle and getting preventive care can help to protect your health and wellness after age 68. °Screening and testing are the best way to find a health problem early and help you avoid having a fall. Early diagnosis and treatment give you the best chance for managing medical conditions that are more common for people who are older than age 68. °Falls are a major cause of broken bones and head injuries in people who are older than age 68. Take precautions to prevent a fall at home. °Work with your health care provider to learn what changes you can make to improve your health and wellness and to prevent falls. °This information is not intended to replace advice given to you by your health care provider. Make sure you discuss any questions you have with your health care provider. °Document Revised: 06/06/2020 Document Reviewed: 06/06/2020 °Elsevier Patient Education © 2022 [employer] ° °

## 2021-03-13 NOTE — Progress Notes (Signed)
This visit occurred during the SARS-CoV-2 public health emergency.  Safety protocols were in place, including screening questions prior to the visit, additional usage of staff PPE, and extensive cleaning of exam room while observing appropriate contact time as indicated for disinfecting solutions.    Patient ID: Kristie Owens, female  DOB: 1955-02-24, 66 y.o.   MRN: [id number] Patient Care Team    Relationship Specialty Notifications Start End  Ma Hillock, DO PCP - General Family Medicine  06/09/19   Jerline Pain, MD PCP - Cardiology Cardiology  12/17/19   Jerline Pain, MD Consulting Physician Cardiology  06/08/19   Otelia Sergeant, OD Referring Physician   06/11/19   Ralene Muskrat Physician Assistant Chiropractic Medicine  06/11/19   Otelia Sergeant, OD Referring Physician   03/13/21     Chief Complaint  Patient presents with   Annual Exam    Pt is fasting    Subjective: Kristie Owens is a 66 y.o.  Female  present for CPE/CMC. All past medical history, surgical history, allergies, family history, immunizations, medications and social history were updated in the electronic medical record today. All recent labs, ED visits and hospitalizations within the last year were reviewed.  Health maintenance:  Colonoscopy: completed 2019 at [place], patient states she was told 10-year follow-up.  No records received.  Next due 2029. Mammogram: completed: 05/2020 - BCGSO> ordered placed.  Cervical cancer screening: last pap: Greater than 65/not indicated Immunizations: tdap UTD 07/2013, Influenza UTD 2022 (encouraged yearly), PNA series completed, Covid series completed with booster.shingrix completed Infectious disease screening: Hep C screening completed DEXA: Estrogen deficient -05/2020-1.9, BCGSO> 2 yr follow up Assistive device: none Oxygen MBE:MLJQ Patient has a Dental home. Hospitalizations/ED visits: reviewed  Hypertension/hyperlipidemia/coronary artery disease/TIA: Pt  reports compliance  with lisinopril-HCTZ 20-12.5, amlodipine 5 mg, Coreg 6.25 mg twice daily, Crestor 20 mg daily, Imdur 60 mg daily. Blood pressures ranges at home WNL. Patient denies chest pain, shortness of breath, dizziness or lower extremity edema.    Patient is established with Dr. Marlou Porch for cardiology.  Has a history of coronary artery disease status post heart catheterization in December 2012 and May 2018 demonstrating widely patent LAD and RCA stent, mild irregularity of the ostium of the diagonal where PTCA was felt to be high risk RF: Hypertension, hyperlipidemia, obesity, family history of heart disease   Echocardiogram 12/07/2019 IMPRESSIONS   1. Left ventricular ejection fraction, by estimation, is 65 to 70%. The  left ventricle has normal function. The left ventricle has no regional  wall motion abnormalities. There is mild left ventricular hypertrophy.  Left ventricular diastolic parameters  are indeterminate.   2. Right ventricular systolic function is normal. The right ventricular  size is normal. Tricuspid regurgitation signal is inadequate for assessing  PA pressure.   3. The mitral valve is normal in structure. No evidence of mitral valve  regurgitation.   4. The aortic valve was not well visualized. Aortic valve regurgitation  is not visualized. Mild aortic valve sclerosis is present, with no  evidence of aortic valve stenosis   Cardiac catheterization on  12/04/2019 1. Patent proximal and mid LAD stents 2. Small caliber circumflex with mild to moderate non-obstructive disease 3. Large dominant RCA with patent mid stent, mild proximal stenosis. Moderate disease in the PDA and posterolateral artery, unchanged from last cath.  4. Normal LV systolic function, GBEE=10-07%. LVEDP 16   Recommendations: Continue medical management of CAD.  06/04/16: Widely patent proximal and distal LAD stents. 40% ostial narrowing in the first diagonal. Patent mid right coronary  stents. Proximal 40% RCA eccentric narrowing. Circumflex is a small vessel with 40-50% first obtuse marginal. Ramus intermedius (or first obtuse marginal depending upon categorization) contains 40% proximal narrowing. Normal LV function. Elevated LVEDP at 23 mmHg. EF 55%.   Depression/anxiety/insomnia: Patient has a history of depression with anxiety as well as insomnia. She reports compliance  with Zoloft 100 mg daily.  Ambien, Remeron, trazodone, vistaril has been tried for insomnia- still not sleeping great even on belsomra 10 mg and it is 75$ a mo.    Allergic rhinitis: Patient reports compliance with Singulair and dymista nasal spray.  She is taking allegra.    Diabetes type 2: Patient has history of mildly elevated A1c in the diabetic range of 6.6>6.7> started metformin 500 mg qd > 5.7 last visit. . She is watching her diet and compliant with metformin. Patient denies dizziness, hyperglycemic or hypoglycemic events. Patient denies numbness, tingling in the extremities or nonhealing wounds of feet.    Depression screen [facility] 2/9 03/13/2021 11/21/2020 06/06/2020 12/30/2019 07/22/2019  Decreased Interest 0 0 0 0 0  Down, Depressed, Hopeless 0 0 1 0 0  PHQ - 2 Score 0 0 1 0 0  Altered sleeping 3 3 3  0 0  Tired, decreased energy 1 1 2  0 0  Change in appetite 2 0 0 0 0  Feeling bad or failure about yourself  0 0 0 0 0  Trouble concentrating 0 0 0 0 0  Moving slowly or fidgety/restless 0 0 0 0 0  Suicidal thoughts 0 0 0 0 0  PHQ-9 Score 6 4 6  0 0  Difficult doing work/chores - - Not difficult at all - Not difficult at all   GAD 7 : Generalized Anxiety Score 03/13/2021 11/21/2020 06/06/2020  Nervous, Anxious, on Edge 0 0 0  Control/stop worrying 0 0 0  Worry too much - different things 0 0 0  Trouble relaxing 1 0 1  Restless 1 0 0  Easily annoyed or irritable 0 0 0  Afraid - awful might happen 0 0 0  Total GAD 7 Score 2 0 1  Anxiety Difficulty - - Not difficult at all    Immunization  History  Administered Date(s) Administered   Fluad Quad(high Dose 65+) 10/19/2019, 11/21/2020   Influenza Split 12/28/2010, 11/22/2011, 11/13/2012   Influenza, High Dose Seasonal PF 11/21/2018   Influenza,inj,Quad PF,6+ Mos 12/15/2013, 12/13/2016, 11/21/2017   Influenza,inj,quad, With Preservative 09/19/2015   Influenza-Unspecified 11/21/2018   Moderna Sars-Covid-2 Vaccination 02/19/2019, 03/27/2019, 12/19/2019   Pneumococcal Conjugate-13 11/21/2017   Pneumococcal Polysaccharide-23 11/25/2018   Tdap 08/10/2013   Zoster Recombinat (Shingrix) 12/30/2019, 11/21/2020   Past Medical History:  Diagnosis Date   Achilles bursitis or tendinitis 11/11/2012   right    Allergy    Angina at rest [facility])    Dr Marlou Porch   CAD (coronary artery disease), native coronary artery    DES to LAD in 2 areas, one at the bifurcation of a diagonal branch and also in the mid to distal region. Her diagonal branch was also ballooned . She has a DES to RCA 10/10. Her circumflex artery mild ostial disease.   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   Tremor 03/23/2020   Allergies  Allergen Reactions   Bupropion Nausea Only and Other (See Comments)    Nausea and headaches    Sulfa Antibiotics Other (See Comments)    From childhood- reaction not recalled   Prednisone Anxiety and Other (See Comments)    "Made me not feel like myself, altered"   Past Surgical History:  Procedure Laterality Date   BREAST EXCISIONAL BIOPSY Right [place]     LEFT HEART CATH AND CORONARY ANGIOGRAPHY N/A 06/04/2016   Procedure: Left Heart Cath and Coronary Angiography;  Surgeon: Belva Crome, MD;  Location: [facility];  Service: Cardiovascular;  Laterality: N/A;   LEFT HEART CATH AND CORONARY ANGIOGRAPHY N/A 12/04/2019   Procedure: LEFT HEART CATH AND CORONARY ANGIOGRAPHY;  Surgeon: Burnell Blanks, MD;  Location: [facility];  Service: Cardiovascular;  Laterality: N/A;   LEFT HEART CATHETERIZATION WITH CORONARY ANGIOGRAM N/A 01/16/2011   Procedure: LEFT HEART CATHETERIZATION WITH CORONARY ANGIOGRAM;  Surgeon: Candee Furbish, MD;  Location: [facility] CATH LAB;  Service: Cardiovascular;  Laterality: N/A;   NOSE SURGERY     SINOSCOPY     Family History  Problem Relation Age of Onset   Hyperlipidemia Brother    Hypertension Brother    Intellectual disability Brother    Kidney disease Brother    Hypertension Mother    Heart disease Mother    COPD Mother    Ulcers Mother    Lung cancer Father    Heart failure Paternal Grandmother    Heart attack Paternal Grandmother    Hypertension Paternal Grandmother    Hyperlipidemia Paternal Grandmother    Heart disease Paternal Grandmother    Heart attack Brother    Asthma Brother    Early death Brother    Hyperlipidemia Brother    Hypertension Brother    Intellectual disability Brother    Kidney disease Brother    Heart disease Brother    Hypertension Sister    Diabetes Sister    Early death Maternal Grandmother    Tuberculosis Maternal Grandmother    Lung cancer Maternal Grandfather    Suicidality Maternal Grandfather    Early death Maternal Grandfather    Heart disease Paternal Grandfather    Hyperlipidemia Paternal Grandfather    Hypertension Paternal Grandfather    Heart attack Other    Arthritis Sister    Depression Sister    Miscarriages / Stillbirths Sister    Alcohol abuse Brother    Stroke Neg Hx    Social History    Social History Narrative   Marital status/children/pets: Married.  One child.   Education/employment: Retired.  Associates degree in college.   Safety:      -Wears a bicycle helmet riding a bike: Yes     -smoke alarm in the home:Yes     - wears seatbelt: Yes     - Feels safe in their relationships: Yes    Allergies as of 03/13/2021  Reactions   Bupropion Nausea Only, Other (See Comments)   Nausea and headaches    Sulfa Antibiotics Other (See Comments)   From childhood- reaction not recalled   Prednisone Anxiety, Other (See Comments)   "Made me not feel like myself, altered"        Medication List        Accurate as of March 13, 2021 11:33 AM. If you have any questions, ask your nurse or doctor.          STOP taking these medications    Belsomra 10 MG Tabs Generic drug: Suvorexant Stopped by: Howard Pouch, DO   mirtazapine 15 MG tablet Commonly known as: REMERON Stopped by: Howard Pouch, DO       TAKE these medications    amLODipine 5 MG tablet Commonly known as: NORVASC Take 1 tablet (5 mg total) by mouth daily.   aspirin EC 81 MG tablet Take 81 mg by mouth daily.   carvedilol 6.25 MG tablet Commonly known as: COREG Take 1 tablet (6.25 mg total) by mouth 2 (two) times daily with a meal.   cetirizine 10 MG tablet Commonly known as: ZyrTEC Allergy Take 1 tablet (10 mg total) by mouth daily.   EPINEPHrine 0.3 mg/0.3 mL Soaj injection Commonly known as: EPI-PEN Inject 0.3 mg into the muscle as needed for anaphylaxis.   gabapentin 300 MG capsule Commonly known as: NEURONTIN Take 1 capsule (300 mg total) by mouth at bedtime. Started by: Howard Pouch, DO   isosorbide mononitrate 60 MG 24 hr tablet Commonly known as: IMDUR TAKE 1.5 TABLETS (90 MG TOTAL) BY MOUTH DAILY. *DUE FOR APPOINTMENT BEFORE FURTHER REFILLS*   lisinopril-hydrochlorothiazide 20-12.5 MG tablet Commonly known as: ZESTORETIC Take 1 tablet by mouth daily.   meloxicam  15 MG tablet Commonly known as: MOBIC Take 1 tablet (15 mg total) by mouth daily.   metFORMIN 500 MG tablet Commonly known as: GLUCOPHAGE Take 1 tablet (500 mg total) by mouth daily with breakfast.   montelukast 10 MG tablet Commonly known as: SINGULAIR Take 1 tablet (10 mg total) by mouth at bedtime.   nitroGLYCERIN 0.4 MG SL tablet Commonly known as: NITROSTAT Place 1 tablet (0.4 mg total) under the tongue every 5 (five) minutes as needed for chest pain.   omeprazole 20 MG tablet Commonly known as: PRILOSEC OTC Take 20 mg by mouth daily as needed (acid reflux).   PATADAY OP Place 1 drop into both eyes daily as needed (for itching or dryness).   rosuvastatin 20 MG tablet Commonly known as: CRESTOR Take 1 tablet (20 mg total) by mouth daily.   sertraline 100 MG tablet Commonly known as: ZOLOFT Take 1 tablet (100 mg total) by mouth at bedtime.   triamcinolone 55 MCG/ACT Aero nasal inhaler Commonly known as: NASACORT Place 1 spray into the nose 2 (two) times daily as needed (nasal congestion).        All past medical history, surgical history, allergies, family history, immunizations andmedications were updated in the EMR today and reviewed under the history and medication portions of their EMR.     No results found for this or any previous visit (from the past 2160 hour(s)).  DG Bone Density Result Date: 06/22/2020 EXAM: DUAL X-RAY ABSORPTIOMETRY (DXA) FOR BONE MINERAL DENSITY IMPRESSION: Referring Physician:  Marcellino Fidalgo A Timmya Blazier Your patient completed a bone mineral density test using GE Lunar iDXA system (analysis version: 16). Technologist: [place] PATIENT: Name: Devann, Cribb Patient ID: [id number] Birth Date: May 17, 1955 Height: 63.5  in. Sex: Female Measured: 06/22/2020 Weight: 202.4 lbs. Indications: Caucasian, Depression, Diabetic non insulin, Estrogen Deficient, Postmenopausal, Prilosec, Zoloft, Secondary Osteoporosis Fractures: None Treatments: None ASSESSMENT: The BMD  measured at Femur Neck Left is 0.773 g/cm2 with a T-score of -1.9.   MM 3D SCREEN BREAST BILATERAL Result Date: 06/24/2020 RECOMMENDATION: Screening mammogram in one year. (Code:SM-B-01Y) BI-RADS CATEGORY  1: Negative. Electronically Signed   By: Franki Cabot M.D.   On: 06/24/2020 13:36     ROS 14 pt review of systems performed and negative (unless mentioned in an HPI)  Objective:  BP 128/66    Pulse (!) 57    Temp 98.4 F (36.9 C) (Oral)    Ht 5' 3.5" (1.613 m)    Wt 207 lb (93.9 kg)    SpO2 96%    BMI 36.09 kg/m  Physical Exam Vitals and nursing note reviewed.  Constitutional:      General: She is not in acute distress.    Appearance: Normal appearance. She is obese. She is not ill-appearing or toxic-appearing.  HENT:     Head: Normocephalic and atraumatic.     Right Ear: Tympanic membrane, ear canal and external ear normal. There is no impacted cerumen.     Left Ear: Tympanic membrane, ear canal and external ear normal. There is no impacted cerumen.     Nose: No congestion or rhinorrhea.     Mouth/Throat:     Mouth: Mucous membranes are moist.     Pharynx: Oropharynx is clear. No oropharyngeal exudate or posterior oropharyngeal erythema.  Eyes:     General: No scleral icterus.       Right eye: No discharge.        Left eye: No discharge.     Extraocular Movements: Extraocular movements intact.     Conjunctiva/sclera: Conjunctivae normal.     Pupils: Pupils are equal, round, and reactive to light.  Cardiovascular:     Rate and Rhythm: Normal rate and regular rhythm.     Pulses: Normal pulses.     Heart sounds: Normal heart sounds. No murmur heard.   No friction rub. No gallop.  Pulmonary:     Effort: Pulmonary effort is normal. No respiratory distress.     Breath sounds: Normal breath sounds. No stridor. No wheezing, rhonchi or rales.  Chest:     Chest wall: No tenderness.  Abdominal:     General: Abdomen is flat. Bowel sounds are normal. There is no distension.      Palpations: Abdomen is soft. There is no mass.     Tenderness: There is no abdominal tenderness. There is no right CVA tenderness, left CVA tenderness, guarding or rebound.     Hernia: No hernia is present.  Musculoskeletal:        General: No swelling, tenderness or deformity. Normal range of motion.     Cervical back: Normal range of motion and neck supple. No rigidity or tenderness.     Right lower leg: No edema.     Left lower leg: No edema.  Lymphadenopathy:     Cervical: No cervical adenopathy.  Skin:    General: Skin is warm and dry.     Coloration: Skin is not jaundiced or pale.     Findings: No bruising, erythema, lesion or rash.  Neurological:     General: No focal deficit present.     Mental Status: She is alert and oriented to person, place, and time. Mental status is at baseline.  Cranial Nerves: No cranial nerve deficit.     Sensory: No sensory deficit.     Motor: No weakness.     Coordination: Coordination normal.     Gait: Gait normal.     Deep Tendon Reflexes: Reflexes normal.  Psychiatric:        Mood and Affect: Mood normal.        Behavior: Behavior normal.        Thought Content: Thought content normal.        Judgment: Judgment normal.      No results found.  Assessment/plan: CLEOLA PERRYMAN is a 66 y.o. female present for CPE/cmc Osteoarthritis -Stable  -Continue Mobic qd with food.  - continue  OTC voltaren gel/cream.  - discussed cherry tart extract and tumeric natural supplements and their SE potentials  - ANA, RF, CCP, ESR, CRP > WNL   Major depressive disorder, remission status unspecified, unspecified whether recurrent/insomnia Stable pression anxiety/sleep disturbance still not well controlled continue Continue Zoloft 100 mg daily. Discontinued Ambien 2021.   DCd trazodone taper 25-75 mg nightly.> not helpful Dc vistaril> not helpful and felt bad on higher dose.  DC'd belsomra > discontinued> expensive, will low-dose worked okay but not  well.    Diabetes type 2: Stable. Patient encouraged to continue exercise and dietary modifications.  Continue  metformin 500 mg qd a1c improved today 5.7 (was 6.7)> 5.8> collected today.   Eye exam up-to-date 12/15/2019 Foot exam completed 11/21/2020 - Hemoglobin A1c - Microalbumin / creatinine urine ratio F/u 5.5 mos    Essential hypertension/hyperlipidemia/Presence of coronary angioplasty implant and graft/Atherosclerosis of native coronary artery of native heart with other form of angina pectoris (HCC)/Intermediate coronary syndrome (HCC)/ Palpitations/Transient neurologic deficit/Morbid obesity ([place]) Stable.   Continue daily exercise. Continue low-sodium/heart healthy diet. Continue Imdur> prescribed by cardiology. Continue  amlodipine 5 mg daily Continue  Coreg 6.25 mg twice daily continue lisinopril-HCTZ 20-12.5 mg daily Continue Crestor 20 mg daily Continue baby aspirin daily  - CBC - Comprehensive metabolic panel - Lipid panel - TSH  Allergic rhinitis, unspecified seasonality, unspecified trigger Continue antihistamine of choice.  OTC  Continue continue Dymista. Continue Singulair Est with A&A Breast cancer screening by mammogram - MM 3D SCREEN BREAST BILATERAL; Future Osteopenia, unspecified location - Vitamin D (25 hydroxy)  Routine general medical examination at a health care facility Colonoscopy: completed 2019 at [place], patient states she was told 10-year follow-up.  No records received.  Next due 2029. Mammogram: completed: 05/2020 - BCGSO> ordered placed.  Cervical cancer screening: last pap: Greater than 65/not indicated Immunizations: tdap UTD 07/2013, Influenza UTD 2022 (encouraged yearly), PNA series completed, Covid series completed with booster.shingrix completed Infectious disease screening: Hep C screening completed DEXA: Estrogen deficient -05/2020-1.9, BCGSO> 2 yr follow up Patient was encouraged to exercise greater than 150 minutes a week.  Patient was encouraged to choose a diet filled with fresh fruits and vegetables, and lean meats. AVS provided to patient today for education/recommendation on gender specific health and safety maintenance. Return in about 24 weeks (around 08/28/2021) for CMC (30 min).  Orders Placed This Encounter  Procedures   MM 3D SCREEN BREAST BILATERAL   CBC   Comprehensive metabolic panel   Hemoglobin A1c   Lipid panel   TSH   Microalbumin / creatinine urine ratio   Vitamin D (25 hydroxy)    Orders Placed This Encounter  Procedures   MM 3D SCREEN BREAST BILATERAL   CBC   Comprehensive metabolic panel  Hemoglobin A1c   Lipid panel   TSH   Microalbumin / creatinine urine ratio   Vitamin D (25 hydroxy)   Meds ordered this encounter  Medications   amLODipine (NORVASC) 5 MG tablet    Sig: Take 1 tablet (5 mg total) by mouth daily.    Dispense:  90 tablet    Refill:  1   carvedilol (COREG) 6.25 MG tablet    Sig: Take 1 tablet (6.25 mg total) by mouth 2 (two) times daily with a meal.    Dispense:  180 tablet    Refill:  1   lisinopril-hydrochlorothiazide (ZESTORETIC) 20-12.5 MG tablet    Sig: Take 1 tablet by mouth daily.    Dispense:  90 tablet    Refill:  1   metFORMIN (GLUCOPHAGE) 500 MG tablet    Sig: Take 1 tablet (500 mg total) by mouth daily with breakfast.    Dispense:  90 tablet    Refill:  1   montelukast (SINGULAIR) 10 MG tablet    Sig: Take 1 tablet (10 mg total) by mouth at bedtime.    Dispense:  90 tablet    Refill:  1   rosuvastatin (CRESTOR) 20 MG tablet    Sig: Take 1 tablet (20 mg total) by mouth daily.    Dispense:  90 tablet    Refill:  3   sertraline (ZOLOFT) 100 MG tablet    Sig: Take 1 tablet (100 mg total) by mouth at bedtime.    Dispense:  90 tablet    Refill:  1   meloxicam (MOBIC) 15 MG tablet    Sig: Take 1 tablet (15 mg total) by mouth daily.    Dispense:  90 tablet    Refill:  1   gabapentin (NEURONTIN) 300 MG capsule    Sig: Take 1 capsule  (300 mg total) by mouth at bedtime.    Dispense:  90 capsule    Refill:  1   Referral Orders  No referral(s) requested today     Electronically signed by: Howard Pouch, [place]

## 2021-03-14 ENCOUNTER — Ambulatory Visit (INDEPENDENT_AMBULATORY_CARE_PROVIDER_SITE_OTHER): Payer: Managed Care, Other (non HMO)

## 2021-03-14 DIAGNOSIS — J309 Allergic rhinitis, unspecified: Secondary | ICD-10-CM | POA: Diagnosis not present

## 2021-03-14 LAB — COMPREHENSIVE METABOLIC PANEL
ALT: 16 U/L (ref 0–35)
AST: 25 U/L (ref 0–37)
Albumin: 4.2 g/dL (ref 3.5–5.2)
Alkaline Phosphatase: 69 U/L (ref 39–117)
BUN: 17 mg/dL (ref 6–23)
CO2: 29 mEq/L (ref 19–32)
Calcium: 9.3 mg/dL (ref 8.4–10.5)
Chloride: 99 mEq/L (ref 96–112)
Creatinine, Ser: 0.73 mg/dL (ref 0.40–1.20)
GFR: 85.04 mL/min (ref >60.00)
Glucose, Bld: 91 mg/dL (ref 70–99)
Potassium: 4.1 mEq/L (ref 3.5–5.1)
Sodium: 135 mEq/L (ref 135–145)
Total Bilirubin: 0.4 mg/dL (ref 0.2–1.2)
Total Protein: 8 g/dL (ref 6.0–8.3)

## 2021-03-14 LAB — LIPID PANEL
Cholesterol: 118 mg/dL (ref 0–200)
HDL: 35.7 mg/dL — ABNORMAL LOW
LDL Cholesterol: 55 mg/dL (ref 0–99)
NonHDL: 82.48
Total CHOL/HDL Ratio: 3
Triglycerides: 135 mg/dL (ref 0.0–149.0)
VLDL: 27 mg/dL (ref 0.0–40.0)

## 2021-03-15 ENCOUNTER — Other Ambulatory Visit: Payer: Self-pay | Admitting: Family Medicine

## 2021-03-15 MED ORDER — VITAMIN D (ERGOCALCIFEROL) 1.25 MG (50000 UNIT) PO CAPS
50000.0000 [IU] | ORAL_CAPSULE | ORAL | 4 refills | Status: DC
Start: 2021-03-15 — End: 2021-08-28

## 2021-03-21 ENCOUNTER — Ambulatory Visit (INDEPENDENT_AMBULATORY_CARE_PROVIDER_SITE_OTHER): Payer: Managed Care, Other (non HMO)

## 2021-03-21 ENCOUNTER — Other Ambulatory Visit: Payer: Self-pay

## 2021-03-21 DIAGNOSIS — J309 Allergic rhinitis, unspecified: Secondary | ICD-10-CM

## 2021-03-26 ENCOUNTER — Other Ambulatory Visit: Payer: Self-pay | Admitting: Family Medicine

## 2021-03-30 ENCOUNTER — Other Ambulatory Visit: Payer: Self-pay

## 2021-03-30 ENCOUNTER — Ambulatory Visit (INDEPENDENT_AMBULATORY_CARE_PROVIDER_SITE_OTHER): Payer: Managed Care, Other (non HMO)

## 2021-03-30 DIAGNOSIS — J309 Allergic rhinitis, unspecified: Secondary | ICD-10-CM | POA: Diagnosis not present

## 2021-04-06 ENCOUNTER — Other Ambulatory Visit: Payer: Self-pay

## 2021-04-06 ENCOUNTER — Ambulatory Visit (INDEPENDENT_AMBULATORY_CARE_PROVIDER_SITE_OTHER): Payer: Managed Care, Other (non HMO)

## 2021-04-06 DIAGNOSIS — J309 Allergic rhinitis, unspecified: Secondary | ICD-10-CM

## 2021-04-11 ENCOUNTER — Ambulatory Visit (INDEPENDENT_AMBULATORY_CARE_PROVIDER_SITE_OTHER): Payer: Managed Care, Other (non HMO)

## 2021-04-11 ENCOUNTER — Other Ambulatory Visit: Payer: Self-pay

## 2021-04-11 DIAGNOSIS — J309 Allergic rhinitis, unspecified: Secondary | ICD-10-CM

## 2021-04-18 ENCOUNTER — Other Ambulatory Visit: Payer: Self-pay

## 2021-04-18 ENCOUNTER — Ambulatory Visit (INDEPENDENT_AMBULATORY_CARE_PROVIDER_SITE_OTHER): Payer: Managed Care, Other (non HMO) | Admitting: *Deleted

## 2021-04-18 DIAGNOSIS — J309 Allergic rhinitis, unspecified: Secondary | ICD-10-CM | POA: Diagnosis not present

## 2021-04-22 ENCOUNTER — Other Ambulatory Visit: Payer: Self-pay | Admitting: Cardiology

## 2021-05-02 ENCOUNTER — Ambulatory Visit (INDEPENDENT_AMBULATORY_CARE_PROVIDER_SITE_OTHER): Payer: Managed Care, Other (non HMO)

## 2021-05-02 DIAGNOSIS — J309 Allergic rhinitis, unspecified: Secondary | ICD-10-CM

## 2021-05-09 ENCOUNTER — Ambulatory Visit (INDEPENDENT_AMBULATORY_CARE_PROVIDER_SITE_OTHER): Payer: Managed Care, Other (non HMO)

## 2021-05-09 DIAGNOSIS — J309 Allergic rhinitis, unspecified: Secondary | ICD-10-CM | POA: Diagnosis not present

## 2021-05-12 DIAGNOSIS — J302 Other seasonal allergic rhinitis: Secondary | ICD-10-CM

## 2021-05-12 NOTE — Progress Notes (Signed)
EXP 05/13/22 ?

## 2021-05-15 ENCOUNTER — Other Ambulatory Visit: Payer: Self-pay | Admitting: Family Medicine

## 2021-05-16 ENCOUNTER — Ambulatory Visit (INDEPENDENT_AMBULATORY_CARE_PROVIDER_SITE_OTHER): Payer: Managed Care, Other (non HMO)

## 2021-05-16 ENCOUNTER — Telehealth: Payer: Self-pay

## 2021-05-16 DIAGNOSIS — J309 Allergic rhinitis, unspecified: Secondary | ICD-10-CM

## 2021-05-16 NOTE — Telephone Encounter (Signed)
Patient completed her vial today. She does not have a new vial available at the [place] office.  ?

## 2021-05-23 ENCOUNTER — Ambulatory Visit (INDEPENDENT_AMBULATORY_CARE_PROVIDER_SITE_OTHER): Payer: Managed Care, Other (non HMO)

## 2021-05-23 DIAGNOSIS — J309 Allergic rhinitis, unspecified: Secondary | ICD-10-CM

## 2021-06-01 ENCOUNTER — Ambulatory Visit (INDEPENDENT_AMBULATORY_CARE_PROVIDER_SITE_OTHER): Payer: Managed Care, Other (non HMO)

## 2021-06-01 DIAGNOSIS — J309 Allergic rhinitis, unspecified: Secondary | ICD-10-CM | POA: Diagnosis not present

## 2021-06-06 ENCOUNTER — Ambulatory Visit (INDEPENDENT_AMBULATORY_CARE_PROVIDER_SITE_OTHER): Payer: Managed Care, Other (non HMO)

## 2021-06-06 DIAGNOSIS — J309 Allergic rhinitis, unspecified: Secondary | ICD-10-CM

## 2021-06-13 ENCOUNTER — Ambulatory Visit (INDEPENDENT_AMBULATORY_CARE_PROVIDER_SITE_OTHER): Payer: Managed Care, Other (non HMO) | Admitting: *Deleted

## 2021-06-13 DIAGNOSIS — J309 Allergic rhinitis, unspecified: Secondary | ICD-10-CM | POA: Diagnosis not present

## 2021-06-20 ENCOUNTER — Ambulatory Visit: Payer: Medicare Other | Admitting: Allergy

## 2021-06-20 ENCOUNTER — Other Ambulatory Visit: Payer: Self-pay | Admitting: Family Medicine

## 2021-06-21 ENCOUNTER — Other Ambulatory Visit: Payer: Self-pay | Admitting: Family Medicine

## 2021-06-22 ENCOUNTER — Ambulatory Visit (INDEPENDENT_AMBULATORY_CARE_PROVIDER_SITE_OTHER): Payer: Managed Care, Other (non HMO)

## 2021-06-22 DIAGNOSIS — J309 Allergic rhinitis, unspecified: Secondary | ICD-10-CM

## 2021-06-23 ENCOUNTER — Ambulatory Visit: Payer: Managed Care, Other (non HMO)

## 2021-06-28 ENCOUNTER — Ambulatory Visit
Admission: RE | Admit: 2021-06-28 | Discharge: 2021-06-28 | Disposition: A | Discharge status: Home / Self Care | Payer: Managed Care, Other (non HMO) | Source: Ambulatory Visit | Attending: Family Medicine | Admitting: Family Medicine

## 2021-06-28 DIAGNOSIS — Z1231 Encounter for screening mammogram for malignant neoplasm of breast: Secondary | ICD-10-CM

## 2021-07-12 NOTE — Progress Notes (Signed)
Follow Up Note  RE: Kristie Owens MRN: [id number] DOB: 20-Jan-1956 Date of Office Visit: 07/13/2021  Referring provider: Natalia Leatherwood, DO Primary care provider: Natalia Leatherwood, DO  Chief Complaint: Allergic Rhinitis  (Congestion, sore nose from dryness, or constant dripping, has a sore in her nose and  face pain )  History of Present Illness: I had the pleasure of seeing Kristie Owens for a follow up visit at the [facility] on 07/13/2021. She is a 66 y.o. female, who is being followed for allergic rhinoconjunctivitis on AIT. Her previous allergy office visit was on 12/13/2020 with Dr. Selena Batten. Today is a regular follow up visit.  Seasonal and perennial allergic rhinoconjunctivitis Currently taking Singulair 10mg  daily at night, Nasacort but ran out, zyrtec prn but it causes drowsiness. Using Nettipot. Complaining of nasal congestion, rhinorrhea and some nosebleeds. She has been picking at it.  On AIT and did not notice any improvement in symptoms. Wants to stop after finishing her current vial.   Had eyes checked and has some dry eyes at times.   Assessment and Plan: Kristie Owens is a 66 y.o. female with: Seasonal and perennial allergic rhinoconjunctivitis Past history - Perennial rhinoconjunctivitis symptoms for many years but worse the last 2.  Patient was on allergy immunotherapy for 5 years in the 29s with good benefit.  Status post sinus surgery in the 1990s.  Possibly PCP saw polyps 1 year ago. 2021 skin testing showed: Positive to grass, tree, weed pollen, ragweed, cockroach and dust mites. Started AIT on 03/01/2020 (Dm- Cr & G-Rw-W-T). Dymista caused epistaxis. Interim history -  No improvement with AIT.  Continue environmental control measures. Use over the counter antihistamines such as Zyrtec (cetirizine), Claritin (loratadine), Allegra (fexofenadine), or Xyzal (levocetirizine) daily as needed. May take twice a day during allergy flares. May switch antihistamines  every few months. Continue Singulair (montelukast) 10mg  daily at night. Use cromolyn 4% 1 drop in each eye up to four times a day as needed for itchy/watery eyes.  Continue allergy injections. Will finish current vial and then stop.  Start budesonide saline wash twice a day.  Stop Nasacort. Stop picking at the nose. See ENT.  Nasal saline spray (i.e., Simply Saline) or nasal saline lavage (i.e., NeilMed) is recommended as needed and prior to medicated nasal sprays. Use saline gel to keep the nose moisturized during the winter months.   Return in about 6 months (around 01/12/2022).  Meds ordered this encounter  Medications   cromolyn (OPTICROM) 4 % ophthalmic solution    Sig: Place 1 drop into both eyes 4 (four) times daily as needed (itchy/watery eyes).    Dispense:  10 mL    Refill:  5   DISCONTD: Olopatadine-Mometasone (RYALTRIS) 665-25 MCG/ACT SUSP    Sig: Place 1-2 sprays into the nose in the morning and at bedtime.    Dispense:  29 g    Refill:  5    [phone]   budesonide (PULMICORT) 0.5 MG/2ML nebulizer solution    Sig: Use twice a day in a sinus rinse bottle.    Dispense:  120 mL    Refill:  2   Lab Orders  No laboratory test(s) ordered today    Diagnostics: None.   Medication List:  Current Outpatient Medications  Medication Sig Dispense Refill   amLODipine (NORVASC) 5 MG tablet Take 1 tablet (5 mg total) by mouth daily. 90 tablet 1   aspirin EC 81 MG tablet Take 81 mg  by mouth daily.     budesonide (PULMICORT) 0.5 MG/2ML nebulizer solution Use twice a day in a sinus rinse bottle. 120 mL 2   carvedilol (COREG) 6.25 MG tablet Take 1 tablet (6.25 mg total) by mouth 2 (two) times daily with a meal. 180 tablet 1   cetirizine (ZYRTEC ALLERGY) 10 MG tablet Take 1 tablet (10 mg total) by mouth daily. 30 tablet 5   cromolyn (OPTICROM) 4 % ophthalmic solution Place 1 drop into both eyes 4 (four) times daily as needed (itchy/watery eyes). 10 mL 5   EPINEPHrine 0.3  mg/0.3 mL IJ SOAJ injection Inject 0.3 mg into the muscle as needed for anaphylaxis. 0.3 mL 2   isosorbide mononitrate (IMDUR) 60 MG 24 hr tablet Take 1.5 tablets (90 mg total) by mouth daily. 135 tablet 2   lisinopril-hydrochlorothiazide (ZESTORETIC) 20-12.5 MG tablet Take 1 tablet by mouth daily. 90 tablet 1   meloxicam (MOBIC) 15 MG tablet Take 1 tablet (15 mg total) by mouth daily. 90 tablet 1   metFORMIN (GLUCOPHAGE) 500 MG tablet Take 1 tablet (500 mg total) by mouth daily with breakfast. 90 tablet 1   montelukast (SINGULAIR) 10 MG tablet Take 1 tablet (10 mg total) by mouth at bedtime. 90 tablet 1   nitroGLYCERIN (NITROSTAT) 0.4 MG SL tablet Place 1 tablet (0.4 mg total) under the tongue every 5 (five) minutes as needed for chest pain. 25 tablet 3   omeprazole (PRILOSEC OTC) 20 MG tablet Take 20 mg by mouth daily as needed (acid reflux).     rosuvastatin (CRESTOR) 20 MG tablet Take 1 tablet (20 mg total) by mouth daily. 90 tablet 3   sertraline (ZOLOFT) 100 MG tablet Take 1 tablet (100 mg total) by mouth at bedtime. 90 tablet 1   Vitamin D, Ergocalciferol, (DRISDOL) 1.25 MG (50000 UNIT) CAPS capsule Take 1 capsule (50,000 Units total) by mouth every 7 (seven) days. 12 capsule 4   No current facility-administered medications for this visit.   Allergies: Allergies  Allergen Reactions   Bupropion Nausea Only and Other (See Comments)    Nausea and headaches    Sulfa Antibiotics Other (See Comments)    From childhood- reaction not recalled   Prednisone Anxiety and Other (See Comments)    "Made me not feel like myself, altered"   I reviewed her past medical history, social history, family history, and environmental history and no significant changes have been reported from her previous visit.  Review of Systems  Constitutional:  Negative for appetite change, chills, fever and unexpected weight change.  HENT:  Positive for congestion, nosebleeds, postnasal drip and sneezing.   Eyes:   Positive for itching.  Respiratory:  Negative for cough, chest tightness, shortness of breath and wheezing.   Cardiovascular:  Negative for chest pain.  Gastrointestinal:  Negative for abdominal pain.  Genitourinary:  Negative for difficulty urinating.  Skin:  Negative for rash.  Allergic/Immunologic: Positive for environmental allergies.  Neurological:  Negative for headaches.    Objective: BP 126/78   Pulse 70   Temp (!) 97.5 F (36.4 C)   Resp 18   Ht 5' 3.39" (1.61 m)   Wt 210 lb (95.3 kg)   SpO2 95%   BMI 36.75 kg/m  Body mass index is 36.75 kg/m. Physical Exam Vitals and nursing note reviewed.  Constitutional:      Appearance: Normal appearance. She is well-developed.  HENT:     Head: Normocephalic and atraumatic.     Right Ear: Tympanic  membrane and external ear normal.     Left Ear: Tympanic membrane and external ear normal.     Nose:     Comments: Dried blood on the septum bilaterally.    Mouth/Throat:     Mouth: Mucous membranes are moist.     Pharynx: Oropharynx is clear.  Eyes:     Conjunctiva/sclera: Conjunctivae normal.  Cardiovascular:     Rate and Rhythm: Normal rate and regular rhythm.     Heart sounds: Normal heart sounds. No murmur heard.    No friction rub. No gallop.  Pulmonary:     Effort: Pulmonary effort is normal.     Breath sounds: Normal breath sounds. No wheezing, rhonchi or rales.  Musculoskeletal:     Cervical back: Neck supple.  Skin:    General: Skin is warm.     Findings: No rash.  Neurological:     Mental Status: She is alert and oriented to person, place, and time.  Psychiatric:        Behavior: Behavior normal.    Previous notes and tests were reviewed. The plan was reviewed with the patient/family, and all questions/concerned were addressed.  It was my pleasure to see Kristie Owens today and participate in her care. Please feel free to contact me with any questions or concerns.  Sincerely,  Wyline MoodYoon Emmanuella Mirante, DO Allergy &  Immunology  [facility] [place] [place] office: [phone] [facility] office: [phone]

## 2021-07-13 ENCOUNTER — Encounter: Payer: Self-pay | Admitting: Allergy

## 2021-07-13 ENCOUNTER — Ambulatory Visit (INDEPENDENT_AMBULATORY_CARE_PROVIDER_SITE_OTHER): Payer: Managed Care, Other (non HMO) | Admitting: Allergy

## 2021-07-13 ENCOUNTER — Other Ambulatory Visit: Payer: Self-pay

## 2021-07-13 VITALS — BP 126/78 | HR 70 | Temp 97.5°F | Resp 18 | Ht 63.39 in | Wt 210.0 lb

## 2021-07-13 DIAGNOSIS — J302 Other seasonal allergic rhinitis: Secondary | ICD-10-CM

## 2021-07-13 DIAGNOSIS — H1013 Acute atopic conjunctivitis, bilateral: Secondary | ICD-10-CM

## 2021-07-13 MED ORDER — CROMOLYN SODIUM 4 % OP SOLN: 1.0000 [drp] | Freq: Four times a day (QID) | OPHTHALMIC | 5 refills | Status: DC | PRN

## 2021-07-13 MED ORDER — RYALTRIS 665-25 MCG/ACT NA SUSP: 1.0000 | Freq: Two times a day (BID) | NASAL | 5 refills | Status: DC

## 2021-07-13 MED ORDER — BUDESONIDE 0.5 MG/2ML IN SUSP: RESPIRATORY_TRACT | 2 refills | Status: DC

## 2021-07-13 NOTE — Progress Notes (Signed)
DNO is marked on vials

## 2021-07-13 NOTE — Assessment & Plan Note (Addendum)
Past history - Perennial rhinoconjunctivitis symptoms for many years but worse the last 2.  Patient was on allergy immunotherapy for 5 years in the 51s with good benefit.  Status post sinus surgery in the 1990s.  Possibly PCP saw polyps 1 year ago. 2021 skin testing showed: Positive to grass, tree, weed pollen, ragweed, cockroach and dust mites. Started AIT on 03/01/2020 (Dm- Cr & G-Rw-W-T). Dymista caused epistaxis. Interim history -  No improvement with AIT.   Continue environmental control measures. . Use over the counter antihistamines such as Zyrtec (cetirizine), Claritin (loratadine), Allegra (fexofenadine), or Xyzal (levocetirizine) daily as needed. May take twice a day during allergy flares. May switch antihistamines every few months.  Continue Singulair (montelukast) 10mg  daily at night. . Use cromolyn 4% 1 drop in each eye up to four times a day as needed for itchy/watery eyes.   Continue allergy injections.  Will finish current vial and then stop.   Start budesonide saline wash twice a day.   Stop Nasacort. Stop picking at the nose.  See ENT.  . Nasal saline spray (i.e., Simply Saline) or nasal saline lavage (i.e., NeilMed) is recommended as needed and prior to medicated nasal sprays. . Use saline gel to keep the nose moisturized during the winter months.

## 2021-07-13 NOTE — Patient Instructions (Addendum)
Environmental allergies Past skin testing showed: Positive to grass, tree, weed pollen, ragweed, cockroach and dust mites. Continue environmental control measures as below. Use over the counter antihistamines such as Zyrtec (cetirizine), Claritin (loratadine), Allegra (fexofenadine), or Xyzal (levocetirizine) daily as needed. May take twice a day during allergy flares. May switch antihistamines every few months.  Continue Singulair (montelukast) 10mg  daily at night. Use cromolyn 4% 1 drop in each eye up to four times a day as needed for itchy/watery eyes.   Continue allergy injections. Will finish your current vial and then stop.   Start budesonide saline wash twice a day.  Stop Nasacort. Stop picking at the nose. See ENT.     Nasal saline spray (i.e., Simply Saline) or nasal saline lavage (i.e., NeilMed) is recommended as needed and prior to medicated nasal sprays. Use saline gel to keep the nose moisturized during the winter months.   Follow up in 6 months or sooner if needed.

## 2021-07-25 ENCOUNTER — Ambulatory Visit (INDEPENDENT_AMBULATORY_CARE_PROVIDER_SITE_OTHER): Payer: Managed Care, Other (non HMO)

## 2021-07-25 DIAGNOSIS — J309 Allergic rhinitis, unspecified: Secondary | ICD-10-CM

## 2021-08-22 ENCOUNTER — Ambulatory Visit (INDEPENDENT_AMBULATORY_CARE_PROVIDER_SITE_OTHER): Payer: Managed Care, Other (non HMO)

## 2021-08-22 DIAGNOSIS — J309 Allergic rhinitis, unspecified: Secondary | ICD-10-CM | POA: Diagnosis not present

## 2021-08-28 ENCOUNTER — Encounter: Payer: Self-pay | Admitting: Family Medicine

## 2021-08-28 ENCOUNTER — Ambulatory Visit (INDEPENDENT_AMBULATORY_CARE_PROVIDER_SITE_OTHER): Payer: Managed Care, Other (non HMO) | Admitting: Family Medicine

## 2021-08-28 VITALS — BP 104/62 | HR 66 | Temp 98.4°F | Ht 63.5 in

## 2021-08-28 DIAGNOSIS — J3089 Other allergic rhinitis: Secondary | ICD-10-CM

## 2021-08-28 DIAGNOSIS — F3340 Major depressive disorder, recurrent, in remission, unspecified: Secondary | ICD-10-CM

## 2021-08-28 DIAGNOSIS — I25119 Atherosclerotic heart disease of native coronary artery with unspecified angina pectoris: Secondary | ICD-10-CM | POA: Diagnosis not present

## 2021-08-28 DIAGNOSIS — Z955 Presence of coronary angioplasty implant and graft: Secondary | ICD-10-CM

## 2021-08-28 DIAGNOSIS — E1169 Type 2 diabetes mellitus with other specified complication: Secondary | ICD-10-CM | POA: Diagnosis not present

## 2021-08-28 DIAGNOSIS — I10 Essential (primary) hypertension: Secondary | ICD-10-CM

## 2021-08-28 DIAGNOSIS — H1010 Acute atopic conjunctivitis, unspecified eye: Secondary | ICD-10-CM

## 2021-08-28 DIAGNOSIS — I200 Unstable angina: Secondary | ICD-10-CM

## 2021-08-28 DIAGNOSIS — J302 Other seasonal allergic rhinitis: Secondary | ICD-10-CM

## 2021-08-28 DIAGNOSIS — E785 Hyperlipidemia, unspecified: Secondary | ICD-10-CM

## 2021-08-28 LAB — POCT GLYCOSYLATED HEMOGLOBIN (HGB A1C)
HbA1c POC (<> result, manual entry): 5.9 % (ref 4.0–5.6)
HbA1c, POC (controlled diabetic range): 5.9 % (ref 0.0–7.0)
HbA1c, POC (prediabetic range): 5.9 % (ref 5.7–6.4)
Hemoglobin A1C: 5.9 % — AB (ref 4.0–5.6)

## 2021-08-28 MED ORDER — AMLODIPINE BESYLATE 5 MG PO TABS: 5.0000 mg | ORAL_TABLET | Freq: Every day | ORAL | 1 refills | Status: DC

## 2021-08-28 MED ORDER — LISINOPRIL-HYDROCHLOROTHIAZIDE 20-12.5 MG PO TABS: 1.0000 | ORAL_TABLET | Freq: Every day | ORAL | 1 refills | Status: DC

## 2021-08-28 MED ORDER — CARVEDILOL 6.25 MG PO TABS: 6.2500 mg | ORAL_TABLET | Freq: Two times a day (BID) | ORAL | 1 refills | Status: DC

## 2021-08-28 MED ORDER — ROSUVASTATIN CALCIUM 20 MG PO TABS: 20.0000 mg | ORAL_TABLET | Freq: Every day | ORAL | 3 refills | Status: DC

## 2021-08-28 MED ORDER — MONTELUKAST SODIUM 10 MG PO TABS: 10.0000 mg | ORAL_TABLET | Freq: Every day | ORAL | 1 refills | Status: DC

## 2021-08-28 MED ORDER — VITAMIN D (ERGOCALCIFEROL) 1.25 MG (50000 UNIT) PO CAPS: 50000.0000 [IU] | ORAL_CAPSULE | ORAL | 4 refills | Status: DC

## 2021-08-28 MED ORDER — SERTRALINE HCL 100 MG PO TABS
100.0000 mg | ORAL_TABLET | Freq: Every evening | ORAL | 1 refills | Status: DC
Start: 2021-08-28 — End: 2022-03-12

## 2021-08-28 MED ORDER — METFORMIN HCL 500 MG PO TABS: 500.0000 mg | ORAL_TABLET | Freq: Every day | ORAL | 1 refills | Status: DC

## 2021-08-28 MED ORDER — MELOXICAM 15 MG PO TABS
15.0000 mg | ORAL_TABLET | Freq: Every day | ORAL | 1 refills | Status: DC
Start: 2021-08-28 — End: 2022-03-12

## 2021-08-28 NOTE — Progress Notes (Signed)
Patient ID: Kristie Owens, female  DOB: 10-Jun-1955, 65 y.o.   MRN: [id number] Patient Care Team    Relationship Specialty Notifications Start End  Ma Hillock, DO PCP - General Family Medicine  06/09/19   Jerline Pain, MD PCP - Cardiology Cardiology  12/17/19   Jerline Pain, MD Consulting Physician Cardiology  06/08/19   Otelia Sergeant, OD Referring Physician   06/11/19   Ralene Muskrat Physician Assistant Chiropractic Medicine  06/11/19   Otelia Sergeant, OD Referring Physician   03/13/21     Chief Complaint  Patient presents with   Diabetes    [facility]; pt is not fasting    Subjective: Kristie Owens is a 66 y.o.  Female  present for [facility]. All past medical history, surgical history, allergies, family history, immunizations, medications and social history were updated in the electronic medical record today. All recent labs, ED visits and hospitalizations within the last year were reviewed.   Hypertension/hyperlipidemia/coronary artery disease/TIA: Pt reports compliance  with lisinopril-HCTZ 20-12.5, amlodipine 5 mg, Coreg 6.25 mg twice daily, Crestor 20 mg daily, Imdur 60 mg daily. Patient denies chest pain, shortness of breath, dizziness or lower extremity edema.  Patient is established with Dr. Marlou Porch for cardiology.  Has a history of coronary artery disease status post heart catheterization in December 2012 and May 2018 demonstrating widely patent LAD and RCA stent, mild irregularity of the ostium of the diagonal where PTCA was felt to be high risk RF: Hypertension, hyperlipidemia, obesity, family history of heart disease   Echocardiogram 12/07/2019 IMPRESSIONS   1. Left ventricular ejection fraction, by estimation, is 65 to 70%. The  left ventricle has normal function. The left ventricle has no regional  wall motion abnormalities. There is mild left ventricular hypertrophy.  Left ventricular diastolic parameters  are indeterminate.   2. Right ventricular systolic  function is normal. The right ventricular  size is normal. Tricuspid regurgitation signal is inadequate for assessing  PA pressure.   3. The mitral valve is normal in structure. No evidence of mitral valve  regurgitation.   4. The aortic valve was not well visualized. Aortic valve regurgitation  is not visualized. Mild aortic valve sclerosis is present, with no  evidence of aortic valve stenosis   Cardiac catheterization on  12/04/2019 1. Patent proximal and mid LAD stents 2. Small caliber circumflex with mild to moderate non-obstructive disease 3. Large dominant RCA with patent mid stent, mild proximal stenosis. Moderate disease in the PDA and posterolateral artery, unchanged from last cath.  4. Normal LV systolic function, KYHC=62-37%. LVEDP 16   Recommendations: Continue medical management of CAD.      06/04/16: Widely patent proximal and distal LAD stents. 40% ostial narrowing in the first diagonal. Patent mid right coronary stents. Proximal 40% RCA eccentric narrowing. Circumflex is a small vessel with 40-50% first obtuse marginal. Ramus intermedius (or first obtuse marginal depending upon categorization) contains 40% proximal narrowing. Normal LV function. Elevated LVEDP at 23 mmHg. EF 55%.   Depression/anxiety/insomnia: Patient has a history of depression with anxiety, as well as insomnia. She reports compliance with Zoloft 100 mg daily.  Tried for insomnia: Belsomra, Ambien, Remeron, trazodone, vistaril. Nothing has worked well for her.    Allergic rhinitis: Patient reports compliance with Singulair and dymista nasal spray.  She is taking allegra.    Diabetes type 2: Patient has history of mildly elevated A1c in the diabetic range. She is watching her diet  and compliant with metformin.Patient denies dizziness, hyperglycemic or hypoglycemic events. Patient denies numbness, tingling in the extremities or nonhealing wounds of feet.      08/28/2021    9:04 AM 03/13/2021   11:15  AM 11/21/2020    9:06 AM 06/06/2020    8:19 AM 12/30/2019    8:04 AM  Depression screen PHQ 2/9  Decreased Interest 0 0 0 0 0  Down, Depressed, Hopeless 0 0 0 1 0  PHQ - 2 Score 0 0 0 1 0  Altered sleeping 2 3 3 3  0  Tired, decreased energy 1 1 1 2  0  Change in appetite 0 2 0 0 0  Feeling bad or failure about yourself  0 0 0 0 0  Trouble concentrating 0 0 0 0 0  Moving slowly or fidgety/restless 0 0 0 0 0  Suicidal thoughts 0 0 0 0 0  PHQ-9 Score 3 6 4 6  0  Difficult doing work/chores    Not difficult at all       08/28/2021    9:04 AM 03/13/2021   11:15 AM 11/21/2020    9:06 AM 06/06/2020    8:19 AM  GAD 7 : Generalized Anxiety Score  Nervous, Anxious, on Edge 0 0 0 0  Control/stop worrying 0 0 0 0  Worry too much - different things 0 0 0 0  Trouble relaxing 1 1 0 1  Restless 0 1 0 0  Easily annoyed or irritable 0 0 0 0  Afraid - awful might happen 0 0 0 0  Total GAD 7 Score 1 2 0 1  Anxiety Difficulty    Not difficult at all    Immunization History  Administered Date(s) Administered   Fluad Quad(high Dose 65+) 10/19/2019, 11/21/2020   Influenza Split 12/28/2010, 11/22/2011, 11/13/2012   Influenza, High Dose Seasonal PF 11/21/2018   Influenza,inj,Quad PF,6+ Mos 12/15/2013, 12/13/2016, 11/21/2017   Influenza,inj,quad, With Preservative 09/19/2015   Influenza-Unspecified 11/21/2018   Moderna Covid-19 Vaccine Bivalent Booster 62yr & up 03/27/2021   Moderna Sars-Covid-2 Vaccination 02/19/2019, 03/27/2019, 12/19/2019   Pneumococcal Conjugate-13 11/21/2017   Pneumococcal Polysaccharide-23 11/25/2018   Tdap 08/10/2013   Zoster Recombinat (Shingrix) 12/30/2019, 11/21/2020   Past Medical History:  Diagnosis Date   Achilles bursitis or tendinitis 11/11/2012   right    Allergy    Angina at rest ([facility]    Dr SMarlou Porch  CAD (coronary artery disease), native coronary artery    DES to LAD in 2 areas, one at the bifurcation of a diagonal branch and also in the mid to distal region.  Her diagonal branch was also ballooned . She has a DES to RCA 10/10. Her circumflex artery mild ostial disease.   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   Tremor 03/23/2020   Allergies  Allergen Reactions   Bupropion Nausea Only and Other (See Comments)    Nausea  and headaches    Sulfa Antibiotics Other (See Comments)    From childhood- reaction not recalled   Prednisone Anxiety and Other (See Comments)    "Made me not feel like myself, altered"   Past Surgical History:  Procedure Laterality Date   BREAST EXCISIONAL BIOPSY Right [place]     LEFT HEART CATH AND CORONARY ANGIOGRAPHY N/A 06/04/2016   Procedure: Left Heart Cath and Coronary Angiography;  Surgeon: Belva Crome, MD;  Location: [facility];  Service: Cardiovascular;  Laterality: N/A;   LEFT HEART CATH AND CORONARY ANGIOGRAPHY N/A 12/04/2019   Procedure: LEFT HEART CATH AND CORONARY ANGIOGRAPHY;  Surgeon: Burnell Blanks, MD;  Location: [facility];  Service: Cardiovascular;  Laterality: N/A;   LEFT HEART CATHETERIZATION WITH CORONARY ANGIOGRAM N/A 01/16/2011   Procedure: LEFT HEART CATHETERIZATION WITH CORONARY ANGIOGRAM;  Surgeon: Candee Furbish, MD;  Location: [facility] CATH LAB;  Service: Cardiovascular;  Laterality: N/A;   NOSE SURGERY     SINOSCOPY     Family History  Problem Relation Age of Onset   Hyperlipidemia Brother    Hypertension Brother    Intellectual disability Brother     Kidney disease Brother    Hypertension Mother    Heart disease Mother    COPD Mother    Ulcers Mother    Lung cancer Father    Heart failure Paternal Grandmother    Heart attack Paternal Grandmother    Hypertension Paternal Grandmother    Hyperlipidemia Paternal Grandmother    Heart disease Paternal Grandmother    Heart attack Brother    Asthma Brother    Early death Brother    Hyperlipidemia Brother    Hypertension Brother    Intellectual disability Brother    Kidney disease Brother    Heart disease Brother    Hypertension Sister    Diabetes Sister    Early death Maternal Grandmother    Tuberculosis Maternal Grandmother    Lung cancer Maternal Grandfather    Suicidality Maternal Grandfather    Early death Maternal Grandfather    Heart disease Paternal Grandfather    Hyperlipidemia Paternal Grandfather    Hypertension Paternal Grandfather    Heart attack Other    Arthritis Sister    Depression Sister    Miscarriages / Stillbirths Sister    Alcohol abuse Brother    Stroke Neg Hx    Social History   Social History Narrative   Marital status/children/pets: Married.  One child.   Education/employment: Retired.  Associates degree in college.   Safety:      -Wears a bicycle helmet riding a bike: Yes     -smoke alarm in the home:Yes     - wears seatbelt: Yes     - Feels safe in their relationships: Yes    Allergies as of 08/28/2021       Reactions   Bupropion Nausea Only, Other (See Comments)   Nausea and headaches    Sulfa Antibiotics Other (See Comments)   From childhood- reaction not recalled   Prednisone Anxiety, Other (See Comments)   "Made me not feel like myself, altered"        Medication List        Accurate as of August 28, 2021  9:19 AM. If you have any questions, ask your nurse or doctor.          amLODipine 5 MG  tablet Commonly known as: NORVASC Take 1 tablet (5 mg total) by mouth daily.   aspirin EC 81 MG tablet Take 81 mg by mouth  daily.   budesonide 0.5 MG/2ML nebulizer solution Commonly known as: Pulmicort Use twice a day in a sinus rinse bottle.   carvedilol 6.25 MG tablet Commonly known as: COREG Take 1 tablet (6.25 mg total) by mouth 2 (two) times daily with a meal.   cetirizine 10 MG tablet Commonly known as: ZyrTEC Allergy Take 1 tablet (10 mg total) by mouth daily.   cromolyn 4 % ophthalmic solution Commonly known as: OPTICROM Place 1 drop into both eyes 4 (four) times daily as needed (itchy/watery eyes).   EPINEPHrine 0.3 mg/0.3 mL Soaj injection Commonly known as: EPI-PEN Inject 0.3 mg into the muscle as needed for anaphylaxis.   isosorbide mononitrate 60 MG 24 hr tablet Commonly known as: IMDUR Take 1.5 tablets (90 mg total) by mouth daily.   lisinopril-hydrochlorothiazide 20-12.5 MG tablet Commonly known as: ZESTORETIC Take 1 tablet by mouth daily.   meloxicam 15 MG tablet Commonly known as: MOBIC Take 1 tablet (15 mg total) by mouth daily.   metFORMIN 500 MG tablet Commonly known as: GLUCOPHAGE Take 1 tablet (500 mg total) by mouth daily with breakfast.   montelukast 10 MG tablet Commonly known as: SINGULAIR Take 1 tablet (10 mg total) by mouth at bedtime.   nitroGLYCERIN 0.4 MG SL tablet Commonly known as: NITROSTAT Place 1 tablet (0.4 mg total) under the tongue every 5 (five) minutes as needed for chest pain.   omeprazole 20 MG tablet Commonly known as: PRILOSEC OTC Take 20 mg by mouth daily as needed (acid reflux).   rosuvastatin 20 MG tablet Commonly known as: CRESTOR Take 1 tablet (20 mg total) by mouth daily.   sertraline 100 MG tablet Commonly known as: ZOLOFT Take 1 tablet (100 mg total) by mouth at bedtime.   Vitamin D (Ergocalciferol) 1.25 MG (50000 UNIT) Caps capsule Commonly known as: DRISDOL Take 1 capsule (50,000 Units total) by mouth every 7 (seven) days.        All past medical history, surgical history, allergies, family history, immunizations  andmedications were updated in the EMR today and reviewed under the history and medication portions of their EMR.     Recent Results (from the past 2160 hour(s))  POCT HgB A1C     Status: Abnormal   Collection Time: 08/28/21  9:11 AM  Result Value Ref Range   Hemoglobin A1C 5.9 (A) 4.0 - 5.6 %   HbA1c POC (<> result, manual entry) 5.9 4.0 - 5.6 %   HbA1c, POC (prediabetic range) 5.9 5.7 - 6.4 %   HbA1c, POC (controlled diabetic range) 5.9 0.0 - 7.0 %    DG Bone Density Result Date: 06/22/2020 EXAM: DUAL X-RAY ABSORPTIOMETRY (DXA) FOR BONE MINERAL DENSITY IMPRESSION: Referring Physician:  [place] Your patient completed a bone mineral density test using GE Lunar iDXA system (analysis version: 16). Technologist: Parker PATIENT: Name: Cashmere, Dingley Patient ID: [id number] Birth Date: [birth year] Height: 63.5 in. Sex: Female Measured: 06/22/2020 Weight: 202.4 lbs. Indications: Caucasian, Depression, Diabetic non insulin, Estrogen Deficient, Postmenopausal, Prilosec, Zoloft, Secondary Osteoporosis Fractures: None Treatments: None ASSESSMENT: The BMD measured at Femur Neck Left is 0.773 g/cm2 with a T-score of -1.9.   MM 3D SCREEN BREAST BILATERAL Result Date: 06/24/2020 RECOMMENDATION: Screening mammogram in one year.     ROS 14 pt review of systems performed and negative (unless mentioned in an  HPI)  Objective: BP 104/62   Pulse 66   Temp 98.4 F (36.9 C) (Oral)   Ht 5' 3.5" (1.613 m)   SpO2 96%   BMI 36.62 kg/m  Physical Exam Vitals and nursing note reviewed.  Constitutional:      General: She is not in acute distress.    Appearance: Normal appearance. She is not ill-appearing, toxic-appearing or diaphoretic.  HENT:     Head: Normocephalic and atraumatic.     Mouth/Throat:     Mouth: Mucous membranes are moist.  Eyes:     General: No scleral icterus.       Right eye: No discharge.        Left eye: No discharge.     Extraocular Movements: Extraocular movements intact.      Conjunctiva/sclera: Conjunctivae normal.     Pupils: Pupils are equal, round, and reactive to light.  Cardiovascular:     Rate and Rhythm: Normal rate and regular rhythm.  Pulmonary:     Effort: Pulmonary effort is normal. No respiratory distress.     Breath sounds: Normal breath sounds. No wheezing, rhonchi or rales.  Musculoskeletal:     Cervical back: Neck supple. No tenderness.  Lymphadenopathy:     Cervical: No cervical adenopathy.  Skin:    General: Skin is warm and dry.     Findings: No rash.  Neurological:     Mental Status: She is alert and oriented to person, place, and time. Mental status is at baseline.     Motor: No weakness.     Gait: Gait normal.  Psychiatric:        Mood and Affect: Mood normal.        Behavior: Behavior normal.        Thought Content: Thought content normal.        Judgment: Judgment normal.     Diabetic Foot Exam - Simple   Simple Foot Form Diabetic Foot exam was performed with the following findings: Yes 08/28/2021  9:07 AM  Visual Inspection No deformities, no ulcerations, no other skin breakdown bilaterally: Yes Sensation Testing Intact to touch and monofilament testing bilaterally: Yes Pulse Check Posterior Tibialis and Dorsalis pulse intact bilaterally: Yes Comments      No results found.  Assessment/plan: Kristie Owens is a 66 y.o. female present for [facility] Osteoarthritis Stable Continue Mobic qd with food.  - continue  OTC voltaren gel/cream.  - discussed cherry tart extract and tumeric natural supplements and their SE potentials  - ANA, RF, CCP, ESR, CRP > WNL   Major depressive disorder, remission status unspecified, unspecified whether recurrent/insomnia Stable depression Continue Zoloft 100 mg daily. Discontinued Ambien 2021.   DCd trazodone taper 25-75 mg nightly.> not helpful Dc vistaril> not helpful and felt bad on higher dose.  DC'd belsomra > discontinued> expensive, will low-dose worked okay but not well.     Diabetes type 2: stable Patient encouraged to continue exercise and dietary modifications.  Continue metformin 500 mg qd a1c improved today 5.7 (was 6.7)> 5.8> 6.6 > 5.9 collected today.   Eye exam up-to-date 2023> records requested Foot exam completed 08/28/2021 - Hemoglobin A1c - Microalbumin / creatinine urine ratio> collected today    Essential hypertension/hyperlipidemia/Presence of coronary angioplasty implant and graft/Atherosclerosis of native coronary artery of native heart with other form of angina pectoris (HCC)/Intermediate coronary syndrome (HCC)/ Palpitations/Transient neurologic deficit/Morbid obesity ([place]) Stable Continue daily exercise. Continue low-sodium/heart healthy diet. Continue Imdur> prescribed by cardiology. Continue amlodipine 5 mg daily  Continue Coreg 6.25 mg twice daily Continue lisinopril-HCTZ 20-12.5 mg daily Continue Crestor 20 mg daily Continue baby aspirin daily   Allergic rhinitis, unspecified seasonality, unspecified trigger Continue antihistamine of choice.  OTC  Continue Singulair Est with A&A  Return in about 7 months (around 03/15/2022) for cpe (20 min), Routine chronic condition follow-up.  Orders Placed This Encounter  Procedures   Urine microalbumin-creatinine with uACR   POCT HgB A1C   Meds ordered this encounter  Medications   amLODipine (NORVASC) 5 MG tablet    Sig: Take 1 tablet (5 mg total) by mouth daily.    Dispense:  90 tablet    Refill:  1   carvedilol (COREG) 6.25 MG tablet    Sig: Take 1 tablet (6.25 mg total) by mouth 2 (two) times daily with a meal.    Dispense:  180 tablet    Refill:  1   lisinopril-hydrochlorothiazide (ZESTORETIC) 20-12.5 MG tablet    Sig: Take 1 tablet by mouth daily.    Dispense:  90 tablet    Refill:  1   meloxicam (MOBIC) 15 MG tablet    Sig: Take 1 tablet (15 mg total) by mouth daily.    Dispense:  90 tablet    Refill:  1   metFORMIN (GLUCOPHAGE) 500 MG tablet    Sig: Take 1 tablet  (500 mg total) by mouth daily with breakfast.    Dispense:  90 tablet    Refill:  1   montelukast (SINGULAIR) 10 MG tablet    Sig: Take 1 tablet (10 mg total) by mouth at bedtime.    Dispense:  90 tablet    Refill:  1   sertraline (ZOLOFT) 100 MG tablet    Sig: Take 1 tablet (100 mg total) by mouth at bedtime.    Dispense:  90 tablet    Refill:  1   rosuvastatin (CRESTOR) 20 MG tablet    Sig: Take 1 tablet (20 mg total) by mouth daily.    Dispense:  90 tablet    Refill:  3   Vitamin D, Ergocalciferol, (DRISDOL) 1.25 MG (50000 UNIT) CAPS capsule    Sig: Take 1 capsule (50,000 Units total) by mouth every 7 (seven) days.    Dispense:  12 capsule    Refill:  4   Referral Orders  No referral(s) requested today     Electronically signed by: Howard Pouch, [place]

## 2021-08-28 NOTE — Patient Instructions (Addendum)
No follow-ups on file.        Great to see you today.  I have refilled the medication(s) we provide.   If labs were collected, we will inform you of lab results once received either by echart message or telephone call.   - echart message- for normal results that have been seen by the patient already.   - telephone call: abnormal results or if patient has not viewed results in their echart.  

## 2021-09-15 ENCOUNTER — Ambulatory Visit (INDEPENDENT_AMBULATORY_CARE_PROVIDER_SITE_OTHER): Payer: Managed Care, Other (non HMO) | Admitting: Family Medicine

## 2021-09-15 ENCOUNTER — Encounter: Payer: Self-pay | Admitting: Family Medicine

## 2021-09-15 VITALS — BP 109/58 | HR 70 | Temp 98.0°F | Ht 63.5 in | Wt 206.0 lb

## 2021-09-15 DIAGNOSIS — L237 Allergic contact dermatitis due to plants, except food: Secondary | ICD-10-CM | POA: Diagnosis not present

## 2021-09-15 MED ORDER — PREDNISONE 20 MG PO TABS: 40.0000 mg | ORAL_TABLET | Freq: Every day | ORAL | 0 refills | Status: DC

## 2021-09-15 NOTE — Progress Notes (Signed)
Kristie Owens , 14-Jan-1956, 66 y.o., female MRN: [id number] Patient Care Team    Relationship Specialty Notifications Start End  Kristie Leatherwood, DO PCP - General Family Medicine  06/09/19   Jake Bathe, MD PCP - Cardiology Cardiology  12/17/19   Jake Bathe, MD Consulting Physician Cardiology  06/08/19   Jill Side, OD Referring Physician   06/11/19   Moody Bruins Physician Assistant Chiropractic Medicine  06/11/19   Jill Side, OD Referring Physician   03/13/21     Chief Complaint  Patient presents with   Rash    Pt c/o poison ivy on legs and arm x 1.5 weeks worsening in the last 5 days     Subjective: Pt presents for an OV with complaints of poison ivy rash of 1.5 weeks duration on her lower extremities and she feels it is spreading.      08/28/2021    9:04 AM 03/13/2021   11:15 AM 11/21/2020    9:06 AM 06/06/2020    8:19 AM 12/30/2019    8:04 AM  Depression screen PHQ 2/9  Decreased Interest 0 0 0 0 0  Down, Depressed, Hopeless 0 0 0 1 0  PHQ - 2 Score 0 0 0 1 0  Altered sleeping 2 3 3 3  0  Tired, decreased energy 1 1 1 2  0  Change in appetite 0 2 0 0 0  Feeling bad or failure about yourself  0 0 0 0 0  Trouble concentrating 0 0 0 0 0  Moving slowly or fidgety/restless 0 0 0 0 0  Suicidal thoughts 0 0 0 0 0  PHQ-9 Score 3 6 4 6  0  Difficult doing work/chores    Not difficult at all     Allergies  Allergen Reactions   Bupropion Nausea Only and Other (See Comments)    Nausea and headaches    Sulfa Antibiotics Other (See Comments)    From childhood- reaction not recalled   Prednisone Anxiety and Other (See Comments)    "Made me not feel like myself, altered"   Social History   Social History Narrative   Marital status/children/pets: Married.  One child.   Education/employment: Retired.  Associates degree in college.   Safety:      -Wears a bicycle helmet riding a bike: Yes     -smoke alarm in the home:Yes     - wears seatbelt: Yes     -  Feels safe in their relationships: Yes   Past Medical History:  Diagnosis Date   Achilles bursitis or tendinitis 11/11/2012   right    Allergy    Angina at rest [facility])    Dr   CAD (coronary artery disease), native coronary artery    DES to LAD in 2 areas, one at the bifurcation of a diagonal branch and also in the mid to distal region. Her diagonal branch was also ballooned . She has a DES to RCA 10/10. Her circumflex artery mild ostial disease.   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   Tremor 03/23/2020   Past Surgical History:  Procedure Laterality Date   BREAST EXCISIONAL BIOPSY Right 1995   CAROTID STENT     CESAREAN SECTION  1983   FOOT SURGERY     LEFT HEART CATH AND CORONARY ANGIOGRAPHY N/A 06/04/2016   Procedure: Left Heart Cath and Coronary Angiography;  Surgeon: Lyn Records, MD;  Location: [facility] INVASIVE CV LAB;  Service: Cardiovascular;  Laterality: N/A;   LEFT HEART CATH AND CORONARY ANGIOGRAPHY N/A 12/04/2019   Procedure: LEFT HEART CATH AND CORONARY ANGIOGRAPHY;  Surgeon: Kathleene Hazel, MD;  Location: [facility] INVASIVE CV LAB;  Service: Cardiovascular;  Laterality: N/A;   LEFT HEART CATHETERIZATION WITH CORONARY ANGIOGRAM N/A 01/16/2011   Procedure: LEFT HEART CATHETERIZATION WITH CORONARY ANGIOGRAM;  Surgeon: Donato Schultz, MD;  Location: [facility] CATH LAB;  Service: Cardiovascular;  Laterality: N/A;   NOSE SURGERY     SINOSCOPY     Family History  Problem Relation Age of Onset   Hyperlipidemia  Brother    Hypertension Brother    Intellectual disability Brother    Kidney disease Brother    Hypertension Mother    Heart disease Mother    COPD Mother    Ulcers Mother    Lung cancer Father    Heart failure Paternal Grandmother    Heart attack Paternal Grandmother    Hypertension Paternal Grandmother    Hyperlipidemia Paternal Grandmother    Heart disease Paternal Grandmother    Heart attack Brother    Asthma Brother    Early death Brother    Hyperlipidemia Brother    Hypertension Brother    Intellectual disability Brother    Kidney disease Brother    Heart disease Brother    Hypertension Sister    Diabetes Sister    Early death Maternal Grandmother    Tuberculosis Maternal Grandmother    Lung cancer Maternal Grandfather    Suicidality Maternal Grandfather    Early death Maternal Grandfather    Heart disease Paternal Grandfather    Hyperlipidemia Paternal Grandfather    Hypertension Paternal Grandfather    Heart attack Other    Arthritis Sister    Depression Sister    Miscarriages / Stillbirths Sister    Alcohol abuse Brother    Stroke Neg Hx    Allergies as of 09/15/2021       Reactions   Bupropion Nausea Only, Other (See Comments)   Nausea and headaches    Sulfa Antibiotics Other (See Comments)   From childhood- reaction not recalled   Prednisone Anxiety, Other (See Comments)   "Made me not feel like myself, altered"        Medication List        Accurate as of September 15, 2021  1:52 PM. If you have any questions, ask your nurse or doctor.          amLODipine 5 MG tablet Commonly known as: NORVASC Take 1 tablet (5 mg total) by mouth daily.   aspirin EC 81 MG tablet Take 81 mg by mouth daily.   budesonide 0.5 MG/2ML nebulizer solution Commonly known as: Pulmicort Use twice a day in a sinus rinse bottle.   carvedilol 6.25 MG tablet Commonly known as: COREG Take 1 tablet (6.25 mg total) by mouth 2 (two) times daily with  a meal.    cetirizine 10 MG tablet Commonly known as: ZyrTEC Allergy Take 1 tablet (10 mg total) by mouth daily.   cromolyn 4 % ophthalmic solution Commonly known as: OPTICROM Place 1 drop into both eyes 4 (four) times daily as needed (itchy/watery eyes).   EPINEPHrine 0.3 mg/0.3 mL Soaj injection Commonly known as: EPI-PEN Inject 0.3 mg into the muscle as needed for anaphylaxis.   isosorbide mononitrate 60 MG 24 hr tablet Commonly known as: IMDUR Take 1.5 tablets (90 mg total) by mouth daily.   lisinopril-hydrochlorothiazide 20-12.5 MG tablet Commonly known as: ZESTORETIC Take 1 tablet by mouth daily.   meloxicam 15 MG tablet Commonly known as: MOBIC Take 1 tablet (15 mg total) by mouth daily.   metFORMIN 500 MG tablet Commonly known as: GLUCOPHAGE Take 1 tablet (500 mg total) by mouth daily with breakfast.   montelukast 10 MG tablet Commonly known as: SINGULAIR Take 1 tablet (10 mg total) by mouth at bedtime.   nitroGLYCERIN 0.4 MG SL tablet Commonly known as: NITROSTAT Place 1 tablet (0.4 mg total) under the tongue every 5 (five) minutes as needed for chest pain.   omeprazole 20 MG tablet Commonly known as: PRILOSEC OTC Take 20 mg by mouth daily as needed (acid reflux).   rosuvastatin 20 MG tablet Commonly known as: CRESTOR Take 1 tablet (20 mg total) by mouth daily.   sertraline 100 MG tablet Commonly known as: ZOLOFT Take 1 tablet (100 mg total) by mouth at bedtime.   Vitamin D (Ergocalciferol) 1.25 MG (50000 UNIT) Caps capsule Commonly known as: DRISDOL Take 1 capsule (50,000 Units total) by mouth every 7 (seven) days.        All past medical history, surgical history, allergies, family history, immunizations andmedications were updated in the EMR today and reviewed under the history and medication portions of their EMR.     ROS Negative, with the exception of above mentioned in HPI   Objective:  BP (!) 109/58   Pulse 70   Temp 98 F (36.7 C) (Oral)    Ht 5' 3.5" (1.613 m)   Wt 206 lb (93.4 kg)   SpO2 97%   BMI 35.92 kg/m  Body mass index is 35.92 kg/m. Physical Exam Vitals and nursing note reviewed.  Constitutional:      General: She is not in acute distress.    Appearance: Normal appearance. She is normal weight. She is not ill-appearing or toxic-appearing.  Eyes:     Extraocular Movements: Extraocular movements intact.     Conjunctiva/sclera: Conjunctivae normal.     Pupils: Pupils are equal, round, and reactive to light.  Skin:    Findings: Rash present.  Neurological:     Mental Status: She is alert and oriented to person, place, and time. Mental status is at baseline.  Psychiatric:        Mood and Affect: Mood normal.        Behavior: Behavior normal.        Thought Content: Thought content normal.        Judgment: Judgment normal.     No results found. No results found. No results found for this or any previous visit (from the past 24 hour(s)).  Assessment/Plan: MACKENA PLUMMER is a 66 y.o. female present for OV for  1. Poison ivy Steroid burst.  Discussed preventive measures and OTC tx with her today.    Reviewed expectations re: course of current medical issues. Discussed self-management of symptoms. Outlined signs and  symptoms indicating need for more acute intervention. Patient verbalized understanding and all questions were answered. Patient received an After-Visit Summary.   No orders of the defined types were placed in this encounter.  No orders of the defined types were placed in this encounter.  Referral Orders  No referral(s) requested today     Note is dictated utilizing voice recognition software. Although note has been proof read prior to signing, occasional typographical errors still can be missed. If any questions arise, please do not hesitate to call for verification.   electronically signed by:  Felix Pacini, DO  [facility] - [place]

## 2021-09-15 NOTE — Patient Instructions (Signed)
Poison Ivy Dermatitis Poison ivy dermatitis is redness and soreness of the skin caused by chemicals in the leaves of the poison ivy plant. You may have very bad itching, swelling, a rash, and blisters. What are the causes? Touching a poison ivy plant. Touching something that has the chemical on it. This may include animals or objects that have come in contact with the plant. What increases the risk? Going outdoors often in wooded or marshy areas. Going outdoors without wearing protective clothing, such as closed shoes, long pants, and a long-sleeved shirt. What are the signs or symptoms?  Skin redness. Very bad itching. A rash that often includes bumps and blisters. The rash usually appears 48 hours after exposure, if you have been exposed before. If this is the first time you have been exposed, the rash may not appear until a week after exposure. Swelling. This may occur if the reaction is very bad. Symptoms usually last for 1-2 weeks. The first time you develop this condition, symptoms may last 3-4 weeks. How is this treated? This condition may be treated with: Hydrocortisone cream or calamine lotion to relieve itching. Oatmeal baths to soothe the skin. Medicines, such as over-the-counter antihistamine tablets. Oral steroid medicine for more severe reactions. Follow these instructions at home: Medicines Take or apply over-the-counter and prescription medicines only as told by your doctor. Use hydrocortisone cream or calamine lotion as needed to help with itching. General instructions Do not scratch or rub your skin. Put a cold, wet cloth (cold compress) on the affected areas or take baths in cool water. This will help with itching. Avoid hot baths and showers. Take oatmeal baths as needed. Use colloidal oatmeal. You can get this at a pharmacy or grocery store. Follow the instructions on the package. While you have the rash, wash your clothes right after you wear them. Keep all  follow-up visits as told by your health care provider. This is important. How is this prevented?  Know what poison ivy looks like, so you can avoid it. This plant has three leaves with flowering branches on a single stem. The leaves are glossy. The leaves have uneven edges that come to a point at the front. If you touch poison ivy, wash your skin with soap and water right away. Be sure to wash under your fingernails. When hiking or camping, wear long pants, a long-sleeved shirt, tall socks, and hiking boots. You can also use a lotion on your skin that helps to prevent contact with poison ivy. If you think that your clothes or outdoor gear came in contact with poison ivy, rinse them off with a garden hose before you bring them inside your house. When doing yard work or gardening, wear gloves, long sleeves, long pants, and boots. Wash your garden tools and gloves if they come in contact with poison ivy. If you think that your pet has come into contact with poison ivy, wash him or her with pet shampoo and water. Make sure to wear gloves while washing your pet. Contact a doctor if: You have open sores in the rash area. You have more redness, swelling, or pain in the rash area. You have redness that spreads beyond the rash area. You have fluid, blood, or pus coming from the rash area. You have a fever. You have a rash over a large area of your body. You have a rash on your eyes, mouth, or genitals. Your rash does not get better after a few weeks. Get help right away   if: Your face swells or your eyes swell shut. You have trouble breathing. You have trouble swallowing. These symptoms may be an emergency. Do not wait to see if the symptoms will go away. Get medical help right away. Call your local emergency services (911 in the [place] Do not drive yourself to the hospital. Summary Poison ivy dermatitis is redness and soreness of the skin caused by chemicals in the leaves of the poison ivy  plant. You may have skin redness, very bad itching, swelling, and a rash. Do not scratch or rub your skin. Take or apply over-the-counter and prescription medicines only as told by your doctor. This information is not intended to replace advice given to you by your health care provider. Make sure you discuss any questions you have with your health care provider. Document Revised: 10/31/2020 Document Reviewed: 10/31/2020 Elsevier Patient Education  2023 [employer]  

## 2021-09-25 ENCOUNTER — Other Ambulatory Visit: Payer: Self-pay | Admitting: Allergy

## 2022-01-12 ENCOUNTER — Ambulatory Visit: Payer: Managed Care, Other (non HMO) | Admitting: Cardiology

## 2022-01-12 NOTE — Progress Notes (Deleted)
Cardiology Office Note:    Date:  01/12/2022   ID:  Kristie Owens, DOB [birth year], MRN [id number]  PCP:  Natalia Leatherwood, DO  [facility] Cardiologist:  Donato Schultz, MD  [facility] Electrophysiologist:  None   Referring MD: Natalia Leatherwood, DO   History of Present Illness:    Kristie Owens is a 66 y.o. female here for the follow-up of hypertension and coronary artery disease, patent RCA and LAD stents, catheterization 2021.         Past Medical History:  Diagnosis Date   Achilles bursitis or tendinitis 11/11/2012   right    Allergy    Angina at rest [facility])    Dr Anne Fu   CAD (coronary artery disease), native coronary artery    DES to LAD in 2 areas, one at the bifurcation of a diagonal branch and also in the mid to distal region. Her diagonal branch was also ballooned . She has a DES to RCA 10/10. Her circumflex artery mild ostial disease.   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   Tremor 03/23/2020    Past Surgical History:  Procedure Laterality Date   BREAST EXCISIONAL BIOPSY Right 1995   CAROTID STENT     CESAREAN SECTION  1983   FOOT SURGERY     LEFT HEART CATH AND CORONARY ANGIOGRAPHY N/A 06/04/2016   Procedure: Left Heart Cath and Coronary Angiography;  Surgeon: Lyn Records, MD;  Location: [facility] INVASIVE CV LAB;  Service: Cardiovascular;  Laterality: N/A;   LEFT HEART CATH AND CORONARY ANGIOGRAPHY N/A 12/04/2019   Procedure: LEFT HEART CATH AND CORONARY ANGIOGRAPHY;  Surgeon: Kathleene Hazel, MD;  Location: [facility] INVASIVE CV LAB;  Service: Cardiovascular;  Laterality: N/A;   LEFT HEART CATHETERIZATION WITH CORONARY ANGIOGRAM N/A 01/16/2011   Procedure: LEFT HEART CATHETERIZATION WITH CORONARY ANGIOGRAM;  Surgeon: Donato Schultz, MD;  Location: [facility] CATH LAB;  Service: Cardiovascular;  Laterality: N/A;   NOSE SURGERY     SINOSCOPY      Current Medications: No outpatient medications have been marked as taking for the 01/12/22 encounter (Appointment) with Jake Bathe, MD.     Allergies:   Bupropion, Sulfa antibiotics, and Prednisone   Social History   Socioeconomic History   Marital status: Married    Spouse name: Not on file   Number of children: Not on file   Years of education: Not on file   Highest education level: Associate degree: academic program  Occupational History   Not on file  Tobacco Use   Smoking status: Never   Smokeless tobacco: Never  Vaping Use   Vaping Use: Never used  Substance and Sexual Activity   Alcohol use: Not Currently   Drug use: Never   Sexual activity: Yes    Partners: Male    Comment: husband  Other Topics Concern   Not on file  Social History Narrative   Marital status/children/pets: Married.  One child.   Education/employment: Retired.  Associates degree in college.   Safety:      -Wears a bicycle helmet riding a bike: Yes     -smoke alarm in the home:Yes     - wears seatbelt: Yes     - Feels safe in their relationships: Yes   Social Determinants of Health   Financial Resource Strain: Low Risk  (08/27/2021)   Overall Financial Resource Strain (CARDIA)    Difficulty of Paying Living Expenses: Not hard at all  Food Insecurity: No Food Insecurity (08/27/2021)   Hunger Vital Sign    Worried About Running Out of Food in the Last Year: Never true    Ran Out of Food in the Last Year: Never true  Transportation Needs: No Transportation Needs (08/27/2021)   PRAPARE - Administrator, Civil Service (Medical): No     Lack of Transportation (Non-Medical): No  Physical Activity: Unknown (08/27/2021)   Exercise Vital Sign    Days of Exercise per Week: 0 days    Minutes of Exercise per Session: Not on file  Stress: No Stress Concern Present (08/27/2021)   [employer] of Occupational Health - Occupational Stress Questionnaire    Feeling of Stress : Only a little  Social Connections: Socially Integrated (08/27/2021)   Social Connection and Isolation Panel [NHANES]    Frequency of Communication with Friends and Family: More than three times a week    Frequency of Social Gatherings with Friends and Family: More than three times a week    Attends Religious Services: 1 to 4 times per year    Active Member of [employer] or Organizations: Yes    Attends Engineer, structural: More than 4 times per year    Marital Status: Married     Family History: The patient's family history includes Alcohol abuse in her brother; Arthritis in her sister; Asthma in her brother; COPD in her mother; Depression in her sister; Diabetes in her sister; Early death in her brother, maternal grandfather, and maternal grandmother; Heart attack in her brother, paternal grandmother, and another family member; Heart disease in her brother, mother, paternal grandfather, and paternal grandmother; Heart failure in her paternal grandmother; Hyperlipidemia in her brother, brother, paternal grandfather, and paternal grandmother; Hypertension in her brother, brother, mother, paternal grandfather, paternal grandmother, and sister; Intellectual disability in her brother and brother; Kidney disease in her brother and brother; Lung cancer in her father and maternal grandfather; Miscarriages / [place] in her sister; Suicidality in her maternal grandfather; Tuberculosis in her maternal grandmother; Ulcers in her mother. There is no history of Stroke.  ROS:   Please see the history of present illness.    (+) Chronic back pain All other systems  reviewed and are negative.  EKGs/Labs/Other Studies Reviewed:    The following studies were reviewed today:  CATH 12/04/19: Dist LAD lesion is 25% stenosed. Ost 1st Diag lesion is 40% stenosed. 1st Diag lesion is 25% stenosed. Ramus lesion is 45% stenosed. Ost 1st Mrg to 1st Mrg lesion is 50% stenosed. RPDA lesion is 40% stenosed. Previously placed Prox RCA to Mid RCA stent (unknown type) is widely patent. Prox RCA lesion is 40% stenosed. RPAV lesion is 40% stenosed. Ost Cx to Prox Cx lesion is 40% stenosed. Previously placed Prox LAD to Mid LAD stent (unknown type) is widely patent. Previously placed Mid LAD stent (unknown type) is widely patent. The left ventricular systolic  function is normal. LV end diastolic pressure is normal. The left ventricular ejection fraction is greater than 65% by visual estimate. There is no mitral valve regurgitation.   1. Patent proximal and mid LAD stents 2. Small caliber circumflex with mild to moderate non-obstructive disease 3. Large dominant RCA with patent mid stent, mild proximal stenosis. Moderate disease in the PDA and posterolateral artery, unchanged from last cath.  4. Normal LV systolic function, LVEF=65-70%. LVEDP 16   Recommendations: Continue medical management of CAD.   Diagnostic Dominance: Right   Echocardiogram 12/07/2019:  1. Left ventricular ejection fraction, by estimation, is 65 to 70%. The  left ventricle has normal function. The left ventricle has no regional  wall motion abnormalities. There is mild left ventricular hypertrophy.  Left ventricular diastolic parameters  are indeterminate.   2. Right ventricular systolic function is normal. The right ventricular  size is normal. Tricuspid regurgitation signal is inadequate for assessing  PA pressure.   3. The mitral valve is normal in structure. No evidence of mitral valve  regurgitation.   4. The aortic valve was not well visualized. Aortic valve regurgitation  is  not visualized. Mild aortic valve sclerosis is present, with no  evidence of aortic valve stenosis.   Nuclear Stress 09/12/2018: Nuclear stress EF: 70%. There was no ST segment deviation noted during stress. The study is normal. The left ventricular ejection fraction is hyperdynamic (>65%).   Normal resting and stress perfusion. No ischemia or infarction EF 70%  EKG:  EKG is personally reviewed and interpreted. 12/28/2020: Sinus rhythm. Rate 72 bpm. 12/18/2019: sinus rhythm 68 no other abnormalities  Recent Labs: 03/13/2021: ALT 16; BUN 17; Creatinine, Ser 0.73; Hemoglobin 11.8; Platelets 359.0; Potassium 4.1; Sodium 135; TSH 2.52   Recent Lipid Panel    Component Value Date/Time   CHOL 118 03/13/2021 1137   CHOL 109 04/11/2020 0947   TRIG 135.0 03/13/2021 1137   HDL 35.70 (L) 03/13/2021 1137   HDL 29 (L) 04/11/2020 0947   CHOLHDL 3 03/13/2021 1137   VLDL 27.0 03/13/2021 1137   LDLCALC 55 03/13/2021 1137   LDLCALC 56 04/11/2020 0947     Risk Assessment/Calculations:       Physical Exam:    VS:  There were no vitals taken for this visit.    Wt Readings from Last 3 Encounters:  09/15/21 206 lb (93.4 kg)  07/13/21 210 lb (95.3 kg)  03/13/21 207 lb (93.9 kg)    GEN:  Well nourished, well developed in no acute distress HEENT: Normal NECK: No JVD; No carotid bruits LYMPHATICS: No lymphadenopathy CARDIAC: RRR, no murmurs, rubs, gallops RESPIRATORY:  Clear to auscultation without rales, wheezing or rhonchi  ABDOMEN: Soft, non-tender, non-distended MUSCULOSKELETAL:  No edema; No deformity  SKIN: Warm and dry NEUROLOGIC:  Alert and oriented x 3 PSYCHIATRIC:  Normal affect   ASSESSMENT:    1. Atherosclerosis of native coronary artery of native heart with angina pectoris (HCC)   2. Essential hypertension      PLAN:    In order of problems listed above:  Coronary atherosclerosis of native coronary artery Prior cardiac catheterization reviewed.  Continue with  aggressive medical management.  Prior radial artery site small nodule scarring.  Improved.  Continuing with aspirin 81 mg amlodipine 5 mg a day isosorbide 90 mg a day. Some trouble in the heat. Trying to stay active, Back pain.   Essential hypertension Currently excellently controlled blood pressure.  Amlodipine 5 mg a day.  Lisinopril hydrochlorothiazide 20/12.5  mg a day.  Refills as needed.  Mixed hyperlipidemia Currently taking Crestor 20 mg a day high intensity statin.  Has known coronary artery disease as described above.  LDL 56.  Excellent.  No myalgias.  ALT 14 from outside labs.  Hemoglobin 12.6.   Shared Decision Making/Informed Consent      Follow-up: 1 year.  Medication Adjustments/Labs and Tests Ordered: Current medicines are reviewed at length with the patient today.  Concerns regarding medicines are outlined above.   No orders of the defined types were placed in this encounter.   No orders of the defined types were placed in this encounter.   There are no Patient Instructions on file for this visit.   I,Mathew Stumpf,acting as a Neurosurgeon for [employer], MD.,have documented all relevant documentation on the behalf of Donato Schultz, MD,as directed by  Donato Schultz, MD while in the presence of Donato Schultz, MD.  I, Donato Schultz, MD, have reviewed all documentation for this visit. The documentation on 01/12/22 for the exam, diagnosis, procedures, and orders are all accurate and complete.   Signed, Donato Schultz, MD  01/12/2022 8:21 AM    [facility]

## 2022-01-15 NOTE — Progress Notes (Deleted)
Follow Up Note  RE: Kristie Owens MRN: [id number] DOB: May 20, 1955 Date of Office Visit: 01/16/2022  Referring provider: Natalia Leatherwood, DO Primary care provider: Natalia Leatherwood, DO  Chief Complaint: No chief complaint on file.  History of Present Illness: I had the pleasure of seeing Kristie Owens for a follow up visit at the [facility] on 01/15/2022. She is a 66 y.o. female, who is being followed for allergic rhinoconjunctivitis completed AIT. Her previous allergy office visit was on 07/13/2021 with Dr. Selena Batten. Today is a regular follow up visit.  09/08/2021 ENT visit: "Impression & Plans:  Nasal septal perforation. It was not present a few years ago when I saw her. It makes me wonder if this may have been caused by the nasal steroid inhaler. Recommend she stop using that and never use a steroid inhaler again. Recommend frequent use of nasal saline spray, 20-30 times daily at least. We discussed that if this does not help her symptoms that a nasal septal button and possibly even reconstructive surgery could be other options. Follow-up as needed. "  Seasonal and perennial allergic rhinoconjunctivitis Past history - Perennial rhinoconjunctivitis symptoms for many years but worse the last 2.  Patient was on allergy immunotherapy for 5 years in the 28s with good benefit.  Status post sinus surgery in the 1990s.  Possibly PCP saw polyps 1 year ago. 2021 skin testing showed: Positive to grass, tree, weed pollen, ragweed, cockroach and dust mites. Started AIT on 03/01/2020 (Dm- Cr & G-Rw-W-T). Dymista caused epistaxis. Interim history -  No improvement with AIT.  Continue environmental control measures. Use over the counter antihistamines such as Zyrtec (cetirizine), Claritin (loratadine), Allegra (fexofenadine), or Xyzal (levocetirizine) daily as needed. May take twice a day during allergy flares. May switch antihistamines every few months. Continue Singulair (montelukast) 10mg   daily at night. Use cromolyn 4% 1 drop in each eye up to four times a day as needed for itchy/watery eyes.  Continue allergy injections. Will finish current vial and then stop.  Start budesonide saline wash twice a day.  Stop Nasacort. Stop picking at the nose. See ENT.  Nasal saline spray (i.e., Simply Saline) or nasal saline lavage (i.e., NeilMed) is recommended as needed and prior to medicated nasal sprays. Use saline gel to keep the nose moisturized during the winter months.    Return in about 6 months (around 01/12/2022).  Assessment and Plan: Kristie Owens is a 66 y.o. female with: No problem-specific Assessment & Plan notes found for this encounter.  No follow-ups on file.  No orders of the defined types were placed in this encounter.  Lab Orders  No laboratory test(s) ordered today    Diagnostics: Spirometry:  Tracings reviewed. Her effort: {Blank single:19197::"Good reproducible efforts.","It was hard to get consistent efforts and there is a question as to whether this reflects a maximal maneuver.","Poor effort, data can not be interpreted."} FVC: ***L FEV1: ***L, ***% predicted FEV1/FVC ratio: ***% Interpretation: {Blank single:19197::"Spirometry consistent with mild obstructive disease","Spirometry consistent with moderate obstructive disease","Spirometry consistent with severe obstructive disease","Spirometry consistent with possible restrictive disease","Spirometry consistent with mixed obstructive and restrictive disease","Spirometry uninterpretable due to technique","Spirometry consistent with normal pattern","No overt abnormalities noted given today's efforts"}.  Please see scanned spirometry results for details.  Skin Testing: {Blank single:19197::"Select foods","Environmental allergy panel","Environmental allergy panel and select foods","Food allergy panel","None","Deferred due to recent antihistamines use"}. *** Results discussed with patient/family.   Medication  List:  Current Outpatient Medications  Medication Sig Dispense  Refill   amLODipine (NORVASC) 5 MG tablet Take 1 tablet (5 mg total) by mouth daily. 90 tablet 1   aspirin EC 81 MG tablet Take 81 mg by mouth daily.     budesonide (PULMICORT) 0.5 MG/2ML nebulizer solution Use twice a day in a sinus rinse bottle. 120 mL 2   carvedilol (COREG) 6.25 MG tablet Take 1 tablet (6.25 mg total) by mouth 2 (two) times daily with a meal. 180 tablet 1   cetirizine (ZYRTEC ALLERGY) 10 MG tablet Take 1 tablet (10 mg total) by mouth daily. 30 tablet 5   cromolyn (OPTICROM) 4 % ophthalmic solution Place 1 drop into both eyes 4 (four) times daily as needed (itchy/watery eyes). 10 mL 5   EPINEPHrine 0.3 mg/0.3 mL IJ SOAJ injection Inject 0.3 mg into the muscle as needed for anaphylaxis. 0.3 mL 2   isosorbide mononitrate (IMDUR) 60 MG 24 hr tablet Take 1.5 tablets (90 mg total) by mouth daily. 135 tablet 2   lisinopril-hydrochlorothiazide (ZESTORETIC) 20-12.5 MG tablet Take 1 tablet by mouth daily. 90 tablet 1   meloxicam (MOBIC) 15 MG tablet Take 1 tablet (15 mg total) by mouth daily. 90 tablet 1   metFORMIN (GLUCOPHAGE) 500 MG tablet Take 1 tablet (500 mg total) by mouth daily with breakfast. 90 tablet 1   montelukast (SINGULAIR) 10 MG tablet Take 1 tablet (10 mg total) by mouth at bedtime. 90 tablet 1   nitroGLYCERIN (NITROSTAT) 0.4 MG SL tablet Place 1 tablet (0.4 mg total) under the tongue every 5 (five) minutes as needed for chest pain. 25 tablet 3   omeprazole (PRILOSEC OTC) 20 MG tablet Take 20 mg by mouth daily as needed (acid reflux).     predniSONE (DELTASONE) 20 MG tablet Take 2 tablets (40 mg total) by mouth daily with breakfast. 10 tablet 0   rosuvastatin (CRESTOR) 20 MG tablet Take 1 tablet (20 mg total) by mouth daily. 90 tablet 3   sertraline (ZOLOFT) 100 MG tablet Take 1 tablet (100 mg total) by mouth at bedtime. 90 tablet 1   triamcinolone (NASACORT) 55 MCG/ACT AERO nasal inhaler PLACE 1 SPRAY INTO  THE NOSE 2 (TWO) TIMES DAILY AS NEEDED (NASAL CONGESTION). 16.9 each 3   Vitamin D, Ergocalciferol, (DRISDOL) 1.25 MG (50000 UNIT) CAPS capsule Take 1 capsule (50,000 Units total) by mouth every 7 (seven) days. 12 capsule 4   No current facility-administered medications for this visit.   Allergies: Allergies  Allergen Reactions   Bupropion Nausea Only and Other (See Comments)    Nausea and headaches    Sulfa Antibiotics Other (See Comments)    From childhood- reaction not recalled   Prednisone Anxiety and Other (See Comments)    "Made me not feel like myself, altered"   I reviewed her past medical history, social history, family history, and environmental history and no significant changes have been reported from her previous visit.  Review of Systems  Constitutional:  Negative for appetite change, chills, fever and unexpected weight change.  HENT:  Positive for congestion, nosebleeds, postnasal drip and sneezing.   Eyes:  Positive for itching.  Respiratory:  Negative for cough, chest tightness, shortness of breath and wheezing.   Cardiovascular:  Negative for chest pain.  Gastrointestinal:  Negative for abdominal pain.  Genitourinary:  Negative for difficulty urinating.  Skin:  Negative for rash.  Allergic/Immunologic: Positive for environmental allergies.  Neurological:  Negative for headaches.    Objective: There were no vitals taken for this visit. There  is no height or weight on file to calculate BMI. Physical Exam Vitals and nursing note reviewed.  Constitutional:      Appearance: Normal appearance. She is well-developed.  HENT:     Head: Normocephalic and atraumatic.     Right Ear: Tympanic membrane and external ear normal.     Left Ear: Tympanic membrane and external ear normal.     Nose:     Comments: Dried blood on the septum bilaterally.    Mouth/Throat:     Mouth: Mucous membranes are moist.     Pharynx: Oropharynx is clear.  Eyes:     Conjunctiva/sclera:  Conjunctivae normal.  Cardiovascular:     Rate and Rhythm: Normal rate and regular rhythm.     Heart sounds: Normal heart sounds. No murmur heard.    No friction rub. No gallop.  Pulmonary:     Effort: Pulmonary effort is normal.     Breath sounds: Normal breath sounds. No wheezing, rhonchi or rales.  Musculoskeletal:     Cervical back: Neck supple.  Skin:    General: Skin is warm.     Findings: No rash.  Neurological:     Mental Status: She is alert and oriented to person, place, and time.  Psychiatric:        Behavior: Behavior normal.    Previous notes and tests were reviewed. The plan was reviewed with the patient/family, and all questions/concerned were addressed.  It was my pleasure to see Kristie Owens today and participate in her care. Please feel free to contact me with any questions or concerns.  Sincerely,  Wyline Mood, DO Allergy & Immunology  [facility] office: [phone] [facility] office: [phone]

## 2022-01-16 ENCOUNTER — Ambulatory Visit: Payer: Medicare Other | Admitting: Allergy

## 2022-01-16 DIAGNOSIS — J3089 Other allergic rhinitis: Secondary | ICD-10-CM

## 2022-01-24 ENCOUNTER — Other Ambulatory Visit: Payer: Self-pay | Admitting: Cardiology

## 2022-02-16 ENCOUNTER — Other Ambulatory Visit: Payer: Self-pay | Admitting: Cardiology

## 2022-02-28 ENCOUNTER — Other Ambulatory Visit: Payer: Self-pay | Admitting: Family Medicine

## 2022-02-28 LAB — HM DIABETES EYE EXAM

## 2022-03-02 ENCOUNTER — Ambulatory Visit: Payer: Managed Care, Other (non HMO) | Attending: Cardiology | Admitting: Cardiology

## 2022-03-02 ENCOUNTER — Encounter: Payer: Self-pay | Admitting: Cardiology

## 2022-03-02 VITALS — BP 132/64 | HR 71 | Ht 64.0 in | Wt 209.0 lb

## 2022-03-02 DIAGNOSIS — E782 Mixed hyperlipidemia: Secondary | ICD-10-CM | POA: Diagnosis not present

## 2022-03-02 DIAGNOSIS — I10 Essential (primary) hypertension: Secondary | ICD-10-CM

## 2022-03-02 MED ORDER — ISOSORBIDE MONONITRATE ER 60 MG PO TB24: 60.0000 mg | ORAL_TABLET | Freq: Every day | ORAL | 3 refills | Status: DC

## 2022-03-02 NOTE — Progress Notes (Signed)
Cardiology Office Note:    Date:  03/02/2022   ID:  Kristie Owens, DOB [birth year], MRN [id number]  PCP:  Natalia Leatherwood, DO  [facility] Cardiologist:  Donato Schultz, MD  [facility] Electrophysiologist:  None   Referring MD: Natalia Leatherwood, DO   History of Present Illness:    Kristie Owens is a 67 y.o. female here for the follow-up of hypertension and coronary artery disease prior LAD stents and RCA stent last catheterization 2021.  Has diabetes on metformin.  Is not unusual to have occasional chest discomfort.  This has been longstanding.  Currently she is taking 60 mg of Imdur.  At 1 point she was taking 90 but refills were for 60 mg and she experiences no change with 60 from the 90.  Back pain has limited some of her exercise.  When she tries to lose weight or focuses on it it ends up back firing.  She may end up doing some chair yoga via YouTube.  No syncope no bleeding no orthopnea.  Compliant with her medications.  Past Medical History:  Diagnosis Date   Achilles bursitis or tendinitis 11/11/2012   right    Allergy    Angina at rest    Dr Anne Fu   CAD (coronary artery disease), native coronary artery    DES to LAD in 2 areas, one at the bifurcation of a diagonal branch and also in the mid to distal region. Her diagonal branch was also ballooned . She has a DES to RCA 10/10. Her circumflex artery mild ostial disease.   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   Tremor 03/23/2020    Past  Surgical History:  Procedure Laterality Date   BREAST EXCISIONAL BIOPSY Right 1995   CAROTID STENT     CESAREAN SECTION  1983   FOOT SURGERY     LEFT HEART CATH AND CORONARY ANGIOGRAPHY N/A 06/04/2016   Procedure: Left Heart Cath and Coronary Angiography;  Surgeon: Lyn Records, MD;  Location: [facility] INVASIVE CV LAB;  Service: Cardiovascular;  Laterality: N/A;   LEFT HEART CATH AND CORONARY ANGIOGRAPHY N/A 12/04/2019   Procedure: LEFT HEART CATH AND CORONARY ANGIOGRAPHY;  Surgeon: Kathleene Hazel, MD;  Location: [facility] INVASIVE CV LAB;  Service: Cardiovascular;  Laterality: N/A;   LEFT HEART CATHETERIZATION WITH CORONARY ANGIOGRAM N/A 01/16/2011   Procedure: LEFT HEART CATHETERIZATION WITH CORONARY ANGIOGRAM;  Surgeon: Donato Schultz, MD;  Location: [facility] CATH LAB;  Service: Cardiovascular;  Laterality: N/A;   NOSE SURGERY     SINOSCOPY      Current Medications: Current Meds  Medication Sig   amLODipine (NORVASC) 5 MG tablet Take 1 tablet (5 mg total) by mouth daily.   aspirin EC 81 MG tablet Take 81 mg by mouth daily.   budesonide (PULMICORT) 0.5 MG/2ML nebulizer solution Use twice a day in a sinus rinse bottle.  carvedilol (COREG) 6.25 MG tablet Take 1 tablet (6.25 mg total) by mouth 2 (two) times daily with a meal.   cetirizine (ZYRTEC ALLERGY) 10 MG tablet Take 1 tablet (10 mg total) by mouth daily.   cromolyn (OPTICROM) 4 % ophthalmic solution Place 1 drop into both eyes 4 (four) times daily as needed (itchy/watery eyes).   lisinopril-hydrochlorothiazide (ZESTORETIC) 20-12.5 MG tablet Take 1 tablet by mouth daily.   meloxicam (MOBIC) 15 MG tablet Take 1 tablet (15 mg total) by mouth daily.   metFORMIN (GLUCOPHAGE) 500 MG tablet TAKE 1 TABLET BY MOUTH EVERY DAY WITH BREAKFAST   montelukast (SINGULAIR) 10 MG tablet TAKE 1 TABLET BY MOUTH EVERYDAY AT BEDTIME   nitroGLYCERIN (NITROSTAT) 0.4 MG SL tablet Place 1 tablet (0.4 mg total) under the tongue every 5 (five) minutes as needed for chest  pain.   omeprazole (PRILOSEC OTC) 20 MG tablet Take 20 mg by mouth daily as needed (acid reflux).   rosuvastatin (CRESTOR) 20 MG tablet Take 1 tablet (20 mg total) by mouth daily.   sertraline (ZOLOFT) 100 MG tablet Take 1 tablet (100 mg total) by mouth at bedtime.   triamcinolone (NASACORT) 55 MCG/ACT AERO nasal inhaler PLACE 1 SPRAY INTO THE NOSE 2 (TWO) TIMES DAILY AS NEEDED (NASAL CONGESTION).   Vitamin D, Ergocalciferol, (DRISDOL) 1.25 MG (50000 UNIT) CAPS capsule Take 1 capsule (50,000 Units total) by mouth every 7 (seven) days.   [DISCONTINUED] isosorbide mononitrate (IMDUR) 60 MG 24 hr tablet TAKE 1 TABLET BY MOUTH EVERY DAY     Allergies:   Bupropion, Sulfa antibiotics, and Prednisone   Social History   Socioeconomic History   Marital status: Married    Spouse name: Not on file   Number of children: Not on file   Years of education: Not on file   Highest education level: Associate degree: academic program  Occupational History   Not on file  Tobacco Use   Smoking status: Never   Smokeless tobacco: Never  Vaping Use   Vaping Use: Never used  Substance and Sexual Activity   Alcohol use: Not Currently   Drug use: Never   Sexual activity: Yes    Partners: Male    Comment: husband  Other Topics Concern   Not on file  Social History Narrative   Marital status/children/pets: Married.  One child.   Education/employment: Retired.  Associates degree in college.   Safety:      -Wears a bicycle helmet riding a bike: Yes     -smoke alarm in the home:Yes     - wears seatbelt: Yes     - Feels safe in their relationships: Yes   Social Determinants of Health   Financial Resource Strain: Low Risk  (08/27/2021)   Overall Financial Resource Strain (CARDIA)    Difficulty of Paying Living Expenses: Not hard at all  Food Insecurity: No Food Insecurity (08/27/2021)   Hunger Vital Sign    Worried About Running Out of Food in the Last Year: Never true    Ran Out of Food in the Last  Year: Never true  Transportation Needs: No Transportation Needs (08/27/2021)   PRAPARE - Hydrologist (Medical): No    Lack of Transportation (Non-Medical): No  Physical Activity: Unknown (08/27/2021)   Exercise Vital Sign    Days of Exercise per Week: 0 days    Minutes of Exercise per Session: Not on file  Stress: No Stress Concern Present (08/27/2021)   [employer] - Occupational Stress Questionnaire    Feeling of Stress : Only a little  Social Connections: Socially Integrated (08/27/2021)   Social Connection and Isolation Panel [NHANES]    Frequency of Communication with Friends and Family: More than three times a week    Frequency of Social Gatherings with Friends and Family: More than three times a week    Attends Religious Services: 1 to 4 times per year    Active Member of [employer] or Organizations: Yes    Attends Music therapist: More than 4 times per year    Marital Status: Married     Family History: The patient's family history includes Alcohol abuse in her brother; Arthritis in her sister; Asthma in her brother; COPD in her mother; Depression in her sister; Diabetes in her sister; Early death in her brother, maternal grandfather, and maternal grandmother; Heart attack in her brother, paternal grandmother, and another family member; Heart disease in her brother, mother, paternal grandfather, and paternal grandmother; Heart failure in her paternal grandmother; Hyperlipidemia in her brother, brother, paternal grandfather, and paternal grandmother; Hypertension in her brother, brother, mother, paternal grandfather, paternal grandmother, and sister; Intellectual disability in her brother and brother; Kidney disease in her brother and brother; Lung cancer in her father and maternal grandfather; Miscarriages / [place] in her sister; Suicidality in her maternal grandfather; Tuberculosis in her maternal grandmother; Ulcers  in her mother. There is no history of Stroke.  ROS:   Please see the history of present illness.    (+) Chronic back pain All other systems reviewed and are negative.  EKGs/Labs/Other Studies Reviewed:    The following studies were reviewed today:  CATH 12/04/19: Dist LAD lesion is 25% stenosed. Ost 1st Diag lesion is 40% stenosed. 1st Diag lesion is 25% stenosed. Ramus lesion is 45% stenosed. Ost 1st Mrg to 1st Mrg lesion is 50% stenosed. RPDA lesion is 40% stenosed. Previously placed Prox RCA to Mid RCA stent (unknown type) is widely patent. Prox RCA lesion is 40% stenosed. RPAV lesion is 40% stenosed. Ost Cx to Prox Cx lesion is 40% stenosed. Previously placed Prox LAD to Mid LAD stent (unknown type) is widely patent. Previously placed Mid LAD stent (unknown type) is widely patent. The left ventricular systolic function is normal. LV end diastolic pressure is normal. The left ventricular ejection fraction is greater than 65% by visual estimate. There is no mitral valve regurgitation.   1. Patent proximal and mid LAD stents 2. Small caliber circumflex with mild to moderate non-obstructive disease 3. Large dominant RCA with patent mid stent, mild proximal stenosis. Moderate disease in the PDA and posterolateral artery, unchanged from last cath.  4. Normal LV systolic function, PYPP=50-93%. LVEDP 16   Recommendations: Continue medical management of CAD.   Diagnostic Dominance: Right   Echocardiogram 12/07/2019:  1. Left ventricular ejection fraction, by estimation, is 65 to 70%. The  left ventricle has normal function. The left ventricle has no regional  wall motion abnormalities. There is mild left ventricular hypertrophy.  Left ventricular diastolic parameters  are indeterminate.   2. Right ventricular systolic function is normal. The right ventricular  size is normal. Tricuspid regurgitation signal is inadequate for assessing  PA pressure.   3. The mitral valve is  normal in structure. No evidence of mitral valve  regurgitation.   4. The aortic valve was not well visualized. Aortic valve regurgitation  is not visualized. Mild aortic valve sclerosis is present, with no  evidence  of aortic valve stenosis.   Nuclear Stress 09/12/2018: Nuclear stress EF: 70%. There was no ST segment deviation noted during stress. The study is normal. The left ventricular ejection fraction is hyperdynamic (>65%).   Normal resting and stress perfusion. No ischemia or infarction EF 70%  EKG:  EKG is personally reviewed and interpreted. 03/02/2022-normal sinus rhythm 71 no other abnormalities. 12/28/2020: Sinus rhythm. Rate 72 bpm. 12/18/2019: sinus rhythm 68 no other abnormalities  Recent Labs: 03/13/2021: ALT 16; BUN 17; Creatinine, Ser 0.73; Hemoglobin 11.8; Platelets 359.0; Potassium 4.1; Sodium 135; TSH 2.52   Recent Lipid Panel    Component Value Date/Time   CHOL 118 03/13/2021 1137   CHOL 109 04/11/2020 0947   TRIG 135.0 03/13/2021 1137   HDL 35.70 (L) 03/13/2021 1137   HDL 29 (L) 04/11/2020 0947   CHOLHDL 3 03/13/2021 1137   VLDL 27.0 03/13/2021 1137   LDLCALC 55 03/13/2021 1137   LDLCALC 56 04/11/2020 0947     Risk Assessment/Calculations:       Physical Exam:    VS:  BP 132/64   Pulse 71   Ht 5\' 4"  (1.626 m)   Wt 209 lb (94.8 kg)   BMI 35.87 kg/m     Wt Readings from Last 3 Encounters:  03/02/22 209 lb (94.8 kg)  09/15/21 206 lb (93.4 kg)  07/13/21 210 lb (95.3 kg)    GEN: Well nourished, well developed, in no acute distress HEENT: normal Neck: no JVD, carotid bruits, or masses Cardiac: RRR; no murmurs, rubs, or gallops,no edema  Respiratory:  clear to auscultation bilaterally, normal work of breathing GI: soft, nontender, nondistended, + BS MS: no deformity or atrophy Skin: warm and dry, no rash Neuro:  Alert and Oriented x 3, Strength and sensation are intact Psych: euthymic mood, full affect   ASSESSMENT:    1. Essential  hypertension   2. Mixed hyperlipidemia      PLAN:    In order of problems listed above:   Coronary atherosclerosis of native coronary artery Prior cardiac catheterization reviewed 2021.  Continue with aggressive medical management.  Continuing with aspirin 81 mg amlodipine 5 mg a day isosorbide 60 mg a day. Some trouble in the heat. Trying to stay active, Back pain can be limiting.  No changes made in medication management  Essential hypertension Currently excellently controlled blood pressure.  Amlodipine 5 mg a day.  Lisinopril hydrochlorothiazide 20/12.5 mg a day.  Refills as needed.  No changes made  Mixed hyperlipidemia Currently taking Crestor 20 mg a day high intensity statin.  Has known coronary artery disease as described above.  LDL 55.  Excellent.  No myalgias.  ALT 14 from outside labs.  Hemoglobin 12.6.  Diabetes with hypertension Last hemoglobin A1c 5.9.  On metformin.  Given her obesity, it would be helpful potentially for GLP-1 agonist.  Perhaps she can qualify for Ozempic.  She will be seeing her primary care physician soon.   Shared Decision Making/Informed Consent      Follow-up: 1 year.  Medication Adjustments/Labs and Tests Ordered: Current medicines are reviewed at length with the patient today.  Concerns regarding medicines are outlined above.   Orders Placed This Encounter  Procedures   EKG 12-Lead    Meds ordered this encounter  Medications   isosorbide mononitrate (IMDUR) 60 MG 24 hr tablet    Sig: Take 1 tablet (60 mg total) by mouth daily.    Dispense:  90 tablet    Refill:  3  Patient Instructions  Medication Instructions:  The current medical regimen is effective;  continue present plan and medications.  *If you need a refill on your cardiac medications before your next appointment, please call your pharmacy*  Follow-Up: At [facility], you and your health needs are our priority.  As part of our continuing mission to provide  you with exceptional heart care, we have created designated Provider Care Teams.  These Care Teams include your primary Cardiologist (physician) and Advanced Practice Providers (APPs -  Physician Assistants and Nurse Practitioners) who all work together to provide you with the care you need, when you need it.  We recommend signing up for the patient portal called "MyChart".  Sign up information is provided on this After Visit Summary.  MyChart is used to connect with patients for Virtual Visits (Telemedicine).  Patients are able to view lab/test results, encounter notes, upcoming appointments, etc.  Non-urgent messages can be sent to your provider as well.   To learn more about what you can do with MyChart, go to [email or web address].    Your next appointment:   1 year(s)  Provider:   Donato Schultz, MD        Signed, Donato Schultz, MD  03/02/2022 5:38 PM    [facility]

## 2022-03-02 NOTE — Patient Instructions (Signed)
Medication Instructions:  The current medical regimen is effective;  continue present plan and medications.  *If you need a refill on your cardiac medications before your next appointment, please call your pharmacy*  Follow-Up: At [facility], you and your health needs are our priority.  As part of our continuing mission to provide you with exceptional heart care, we have created designated Provider Care Teams.  These Care Teams include your primary Cardiologist (physician) and Advanced Practice Providers (APPs -  Physician Assistants and Nurse Practitioners) who all work together to provide you with the care you need, when you need it.  We recommend signing up for the patient portal called "MyChart".  Sign up information is provided on this After Visit Summary.  MyChart is used to connect with patients for Virtual Visits (Telemedicine).  Patients are able to view lab/test results, encounter notes, upcoming appointments, etc.  Non-urgent messages can be sent to your provider as well.   To learn more about what you can do with MyChart, go to [email or web address].    Your next appointment:   1 year(s)  Provider:   Mark Skains, MD      

## 2022-03-04 ENCOUNTER — Other Ambulatory Visit: Payer: Self-pay | Admitting: Family Medicine

## 2022-03-12 ENCOUNTER — Encounter: Payer: Self-pay | Admitting: Family Medicine

## 2022-03-12 ENCOUNTER — Ambulatory Visit: Payer: Managed Care, Other (non HMO) | Admitting: Family Medicine

## 2022-03-12 VITALS — BP 113/68 | HR 56 | Temp 98.0°F | Ht 64.0 in | Wt 211.0 lb

## 2022-03-12 DIAGNOSIS — I10 Essential (primary) hypertension: Secondary | ICD-10-CM

## 2022-03-12 DIAGNOSIS — H1010 Acute atopic conjunctivitis, unspecified eye: Secondary | ICD-10-CM

## 2022-03-12 DIAGNOSIS — M1990 Unspecified osteoarthritis, unspecified site: Secondary | ICD-10-CM

## 2022-03-12 DIAGNOSIS — Z0000 Encounter for general adult medical examination without abnormal findings: Secondary | ICD-10-CM

## 2022-03-12 DIAGNOSIS — E1169 Type 2 diabetes mellitus with other specified complication: Secondary | ICD-10-CM

## 2022-03-12 DIAGNOSIS — E785 Hyperlipidemia, unspecified: Secondary | ICD-10-CM | POA: Diagnosis not present

## 2022-03-12 DIAGNOSIS — Z955 Presence of coronary angioplasty implant and graft: Secondary | ICD-10-CM

## 2022-03-12 DIAGNOSIS — I25118 Atherosclerotic heart disease of native coronary artery with other forms of angina pectoris: Secondary | ICD-10-CM

## 2022-03-12 DIAGNOSIS — E559 Vitamin D deficiency, unspecified: Secondary | ICD-10-CM

## 2022-03-12 DIAGNOSIS — F3340 Major depressive disorder, recurrent, in remission, unspecified: Secondary | ICD-10-CM

## 2022-03-12 DIAGNOSIS — I200 Unstable angina: Secondary | ICD-10-CM

## 2022-03-12 DIAGNOSIS — I25119 Atherosclerotic heart disease of native coronary artery with unspecified angina pectoris: Secondary | ICD-10-CM

## 2022-03-12 DIAGNOSIS — J3089 Other allergic rhinitis: Secondary | ICD-10-CM

## 2022-03-12 DIAGNOSIS — M8580 Other specified disorders of bone density and structure, unspecified site: Secondary | ICD-10-CM | POA: Diagnosis not present

## 2022-03-12 DIAGNOSIS — J302 Other seasonal allergic rhinitis: Secondary | ICD-10-CM

## 2022-03-12 DIAGNOSIS — Z1231 Encounter for screening mammogram for malignant neoplasm of breast: Secondary | ICD-10-CM

## 2022-03-12 LAB — COMPREHENSIVE METABOLIC PANEL
ALT: 20 U/L (ref 0–35)
AST: 27 U/L (ref 0–37)
Albumin: 4 g/dL (ref 3.5–5.2)
Alkaline Phosphatase: 68 U/L (ref 39–117)
BUN: 21 mg/dL (ref 6–23)
CO2: 26 mEq/L (ref 19–32)
Calcium: 9.3 mg/dL (ref 8.4–10.5)
Chloride: 100 mEq/L (ref 96–112)
Creatinine, Ser: 0.76 mg/dL (ref 0.40–1.20)
GFR: 80.46 mL/min (ref >60.00)
Glucose, Bld: 103 mg/dL — ABNORMAL HIGH (ref 70–99)
Potassium: 4.1 mEq/L (ref 3.5–5.1)
Sodium: 136 mEq/L (ref 135–145)
Total Bilirubin: 0.5 mg/dL (ref 0.2–1.2)
Total Protein: 7.4 g/dL (ref 6.0–8.3)

## 2022-03-12 LAB — CBC WITH DIFFERENTIAL/PLATELET
Basophils Absolute: 0 10*3/uL (ref 0.0–0.1)
Basophils Relative: 0.2 % (ref 0.0–3.0)
Eosinophils Absolute: 0.1 10*3/uL (ref 0.0–0.7)
Eosinophils Relative: 2.5 % (ref 0.0–5.0)
HCT: 35.8 % — ABNORMAL LOW (ref 36.0–46.0)
Hemoglobin: 11.6 g/dL — ABNORMAL LOW (ref 12.0–15.0)
Lymphocytes Relative: 43 % (ref 12.0–46.0)
Lymphs Abs: 2.1 10*3/uL (ref 0.7–4.0)
MCHC: 32.4 g/dL (ref 30.0–36.0)
MCV: 81.5 fl (ref 78.0–100.0)
Monocytes Absolute: 0.6 10*3/uL (ref 0.1–1.0)
Monocytes Relative: 12.3 % — ABNORMAL HIGH (ref 3.0–12.0)
Neutro Abs: 2.1 10*3/uL (ref 1.4–7.7)
Neutrophils Relative %: 42 % — ABNORMAL LOW (ref 43.0–77.0)
Platelets: 352 10*3/uL (ref 150.0–400.0)
RBC: 4.39 Mil/uL (ref 3.87–5.11)
RDW: 16.2 % — ABNORMAL HIGH (ref 11.5–15.5)
WBC: 4.9 10*3/uL (ref 4.0–10.5)

## 2022-03-12 LAB — LIPID PANEL
Cholesterol: 126 mg/dL (ref 0–200)
HDL: 33.1 mg/dL — ABNORMAL LOW (ref >39.00)
LDL Cholesterol: 58 mg/dL (ref 0–99)
NonHDL: 93.35
Total CHOL/HDL Ratio: 4
Triglycerides: 179 mg/dL — ABNORMAL HIGH (ref 0.0–149.0)
VLDL: 35.8 mg/dL (ref 0.0–40.0)

## 2022-03-12 LAB — HEMOGLOBIN A1C: Hgb A1c MFr Bld: 7.1 % — ABNORMAL HIGH (ref 4.6–6.5)

## 2022-03-12 LAB — VITAMIN D 25 HYDROXY (VIT D DEFICIENCY, FRACTURES): VITD: 33.96 ng/mL (ref 30.00–100.00)

## 2022-03-12 LAB — TSH: TSH: 2.47 u[IU]/mL (ref 0.35–5.50)

## 2022-03-12 MED ORDER — SERTRALINE HCL 100 MG PO TABS: 100.0000 mg | ORAL_TABLET | Freq: Every evening | ORAL | 1 refills | Status: DC

## 2022-03-12 MED ORDER — LISINOPRIL-HYDROCHLOROTHIAZIDE 20-12.5 MG PO TABS
1.0000 | ORAL_TABLET | Freq: Every day | ORAL | 1 refills | Status: DC
Start: 2022-03-12 — End: 2022-08-28

## 2022-03-12 MED ORDER — METFORMIN HCL 500 MG PO TABS: ORAL_TABLET | ORAL | 1 refills | Status: DC

## 2022-03-12 MED ORDER — VITAMIN D (ERGOCALCIFEROL) 1.25 MG (50000 UNIT) PO CAPS: 50000.0000 [IU] | ORAL_CAPSULE | ORAL | 4 refills | Status: DC

## 2022-03-12 MED ORDER — MELOXICAM 15 MG PO TABS
15.0000 mg | ORAL_TABLET | Freq: Every day | ORAL | 1 refills | Status: DC
Start: 2022-03-12 — End: 2022-08-28

## 2022-03-12 MED ORDER — AMLODIPINE BESYLATE 5 MG PO TABS: 5.0000 mg | ORAL_TABLET | Freq: Every day | ORAL | 1 refills | Status: DC

## 2022-03-12 MED ORDER — CARVEDILOL 6.25 MG PO TABS: 6.2500 mg | ORAL_TABLET | Freq: Two times a day (BID) | ORAL | 1 refills | Status: DC

## 2022-03-12 MED ORDER — MONTELUKAST SODIUM 10 MG PO TABS: ORAL_TABLET | ORAL | 1 refills | Status: DC

## 2022-03-12 NOTE — Progress Notes (Signed)
Patient ID: Kristie Owens, female  DOB: [birth year], 67 y.o.   MRN: [id number] Patient Care Team    Relationship Specialty Notifications Start End  Ma Hillock, DO PCP - General Family Medicine  06/09/19   Jerline Pain, MD PCP - Cardiology Cardiology  12/17/19   Jerline Pain, MD Consulting Physician Cardiology  06/08/19   Otelia Sergeant, OD Referring Physician   06/11/19   Ralene Muskrat Physician Assistant Chiropractic Medicine  06/11/19   Otelia Sergeant, OD Referring Physician   03/13/21     Chief Complaint  Patient presents with   Annual Exam    Chronic Conditions/illness Management     Subjective: Kristie Owens is a 67 y.o.  Female  present for CPE with combination appointment for routine chronic condition management All past medical history, surgical history, allergies, family history, immunizations, medications and social history were updated in the electronic medical record today. All recent labs, ED visits and hospitalizations within the last year were reviewed.  Health maintenance:  Colonoscopy: completed 2018 at [place], patient states she was told 10-year follow-up due 2028. Mammogram: completed: 06/28/2021 - BCGSO> ordered placed Immunizations: tdap UTD 07/2013, Influenza UTD 2023 (encouraged yearly), PNA series completed, shingrix completed Infectious disease screening: Hep C screening completed DEXA: Estrogen deficient and osteopenia.  Completed 05/2020-1.9, BCGSO> order placed today Patient has a Dental home. Hospitalizations/ED visits: reviewed  Hypertension/hyperlipidemia/coronary artery disease/TIA: Pt reports compliance with lisinopril-HCTZ 20-12.5, amlodipine 5 mg, Coreg 6.25 mg twice daily, Crestor 20 mg daily, Imdur 60 mg daily.  Patient denies chest pain, shortness of breath, dizziness or lower extremity edema.  Patient is established with Dr. Marlou Porch for cardiology.  Has a history of coronary artery disease status post heart catheterization  in December 2012 and May 2018 demonstrating widely patent LAD and RCA stent, mild irregularity of the ostium of the diagonal where PTCA was felt to be high risk RF: Hypertension, hyperlipidemia, obesity, family history of heart disease   Echocardiogram 12/07/2019 IMPRESSIONS   1. Left ventricular ejection fraction, by estimation, is 65 to 70%. The  left ventricle has normal function. The left ventricle has no regional  wall motion abnormalities. There is mild left ventricular hypertrophy.  Left ventricular diastolic parameters  are indeterminate.   2. Right ventricular systolic function is normal. The right ventricular  size is normal. Tricuspid regurgitation signal is inadequate for assessing  PA pressure.   3. The mitral valve is normal in structure. No evidence of mitral valve  regurgitation.   4. The aortic valve was not well visualized. Aortic valve regurgitation  is not visualized. Mild aortic valve sclerosis is present, with no  evidence of aortic valve stenosis   Cardiac catheterization on  12/04/2019 1. Patent proximal and mid LAD stents 2. Small caliber circumflex with mild to moderate non-obstructive disease 3. Large dominant RCA with patent mid stent, mild proximal stenosis. Moderate disease in the PDA and posterolateral artery, unchanged from last cath.  4. Normal LV systolic function, [id number]. LVEDP 16   Recommendations: Continue medical management of CAD.  06/04/16: Widely patent proximal and distal LAD stents. 40% ostial narrowing in the first diagonal. Patent mid right coronary stents. Proximal 40% RCA eccentric narrowing. Circumflex is a small vessel with 40-50% first obtuse marginal. Ramus intermedius (or first obtuse marginal depending upon categorization) contains 40% proximal narrowing. Normal LV function. Elevated LVEDP at 23 mmHg. EF 55%.   Depression/anxiety/insomnia: Patient has a history of depression  with anxiety, as well as insomnia.  She reports  compliance with Zoloft 100 mg daily.  Tried for insomnia: Belsomra, Ambien, Remeron, trazodone, vistaril. Nothing has worked well for her.    Allergic rhinitis: Patient reports compliance with Singulair and dymista nasal spray.  She is taking allegra.    Diabetes type 2: Patient has history of mildly elevated A1c in the diabetic range. She is watching her diet and compliance with metformin.patient does not routinely follow-up per schedule Patient denies dizziness, hyperglycemic or hypoglycemic events. Patient denies numbness, tingling in the extremities or nonhealing wounds of feet.       03/12/2022    8:04 AM 08/28/2021    9:04 AM 03/13/2021   11:15 AM 11/21/2020    9:06 AM 06/06/2020    8:19 AM  Depression screen PHQ 2/9  Decreased Interest 0 0 0 0 0  Down, Depressed, Hopeless 0 0 0 0 1  PHQ - 2 Score 0 0 0 0 1  Altered sleeping 3 2 3 3 3  $ Tired, decreased energy 1 1 1 1 2  $ Change in appetite 0 0 2 0 0  Feeling bad or failure about yourself  0 0 0 0 0  Trouble concentrating 0 0 0 0 0  Moving slowly or fidgety/restless 0 0 0 0 0  Suicidal thoughts 0 0 0 0 0  PHQ-9 Score 4 3 6 4 6  $ Difficult doing work/chores     Not difficult at all      03/12/2022    8:05 AM 08/28/2021    9:04 AM 03/13/2021   11:15 AM 11/21/2020    9:06 AM  GAD 7 : Generalized Anxiety Score  Nervous, Anxious, on Edge 0 0 0 0  Control/stop worrying 0 0 0 0  Worry too much - different things 0 0 0 0  Trouble relaxing 0 1 1 0  Restless 0 0 1 0  Easily annoyed or irritable 0 0 0 0  Afraid - awful might happen 0 0 0 0  Total GAD 7 Score 0 1 2 0    Immunization History  Administered Date(s) Administered   Fluad Quad(high Dose 65+) 10/19/2019, 11/21/2020, 12/27/2021   Influenza Split 12/28/2010, 11/22/2011, 11/13/2012   Influenza, High Dose Seasonal PF 11/21/2018   Influenza,inj,Quad PF,6+ Mos 12/15/2013, 12/13/2016, 11/21/2017   Influenza,inj,quad, With Preservative 09/19/2015   Influenza-Unspecified  11/21/2018   Moderna Covid-19 Vaccine Bivalent Booster 77yr & up 03/27/2021   Moderna Sars-Covid-2 Vaccination 02/19/2019, 03/27/2019, 12/19/2019   Pneumococcal Conjugate-13 11/21/2017   Pneumococcal Polysaccharide-23 11/25/2018   Tdap 08/10/2013   Zoster Recombinat (Shingrix) 12/30/2019, 11/21/2020   Past Medical History:  Diagnosis Date   Achilles bursitis or tendinitis 11/11/2012   right    Allergy    Angina at rest    Dr SMarlou Porch  CAD (coronary artery disease), native coronary artery    DES to LAD in 2 areas, one at the bifurcation of a diagonal branch and also in the mid to distal region. Her diagonal branch was also ballooned . She has a DES to RCA 10/10. Her circumflex artery mild ostial disease.   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   Tremor 03/23/2020   Allergies  Allergen Reactions   Bupropion Nausea Only and Other (See Comments)    Nausea and headaches    Sulfa Antibiotics Other (See Comments)    From childhood- reaction not recalled   Prednisone Anxiety and Other (See Comments)    "Made me not feel like myself, altered"   Past Surgical History:  Procedure Laterality Date   BREAST EXCISIONAL BIOPSY Right [place] CATH AND CORONARY ANGIOGRAPHY N/A 06/04/2016   Procedure: Left Heart Cath and Coronary Angiography;  Surgeon: Belva Crome, MD;  Location: [facility];  Service: Cardiovascular;  Laterality: N/A;   LEFT  HEART CATH AND CORONARY ANGIOGRAPHY N/A 12/04/2019   Procedure: LEFT HEART CATH AND CORONARY ANGIOGRAPHY;  Surgeon: Burnell Blanks, MD;  Location: [facility];  Service: Cardiovascular;  Laterality: N/A;   LEFT HEART CATHETERIZATION WITH CORONARY ANGIOGRAM N/A 01/16/2011   Procedure: LEFT HEART CATHETERIZATION WITH CORONARY ANGIOGRAM;  Surgeon: Candee Furbish, MD;  Location: [facility] CATH LAB;  Service: Cardiovascular;  Laterality: N/A;   NOSE SURGERY     SINOSCOPY     Family History  Problem Relation Age of Onset   Hyperlipidemia Brother    Hypertension Brother    Intellectual disability Brother    Kidney disease Brother    Hypertension Mother    Heart disease Mother    COPD Mother    Ulcers Mother    Lung cancer Father    Heart failure Paternal Grandmother    Heart attack Paternal Grandmother    Hypertension Paternal Grandmother    Hyperlipidemia Paternal Grandmother    Heart disease Paternal Grandmother    Heart attack Brother    Asthma Brother    Early death Brother    Hyperlipidemia Brother    Hypertension Brother    Intellectual disability Brother    Kidney disease Brother    Heart disease Brother    Hypertension Sister    Diabetes Sister    Early death Maternal Grandmother    Tuberculosis Maternal Grandmother    Lung cancer Maternal Grandfather    Suicidality Maternal Grandfather    Early death Maternal Grandfather    Heart disease Paternal Grandfather    Hyperlipidemia Paternal Grandfather    Hypertension Paternal Grandfather    Heart attack Other    Arthritis Sister    Depression Sister    Miscarriages / Stillbirths Sister    Alcohol abuse Brother    Stroke Neg Hx    Social History   Social History Narrative   Marital status/children/pets: Married.  One child.   Education/employment: Retired.  Associates degree in college.   Safety:      -Wears a bicycle helmet riding a bike: Yes     -smoke alarm in the home:Yes     - wears seatbelt: Yes     -  Feels safe in their relationships: Yes    Allergies as of 03/12/2022       Reactions   Bupropion Nausea Only, Other (See Comments)   Nausea and headaches    Sulfa Antibiotics Other (See Comments)  From childhood- reaction not recalled   Prednisone Anxiety, Other (See Comments)   "Made me not feel like myself, altered"        Medication List        Accurate as of March 12, 2022  8:41 AM. If you have any questions, ask your nurse or doctor.          amLODipine 5 MG tablet Commonly known as: NORVASC Take 1 tablet (5 mg total) by mouth daily.   aspirin EC 81 MG tablet Take 81 mg by mouth daily.   budesonide 0.5 MG/2ML nebulizer solution Commonly known as: Pulmicort Use twice a day in a sinus rinse bottle.   carvedilol 6.25 MG tablet Commonly known as: COREG Take 1 tablet (6.25 mg total) by mouth 2 (two) times daily with a meal.   cetirizine 10 MG tablet Commonly known as: ZyrTEC Allergy Take 1 tablet (10 mg total) by mouth daily.   cromolyn 4 % ophthalmic solution Commonly known as: OPTICROM Place 1 drop into both eyes 4 (four) times daily as needed (itchy/watery eyes).   isosorbide mononitrate 60 MG 24 hr tablet Commonly known as: IMDUR Take 1 tablet (60 mg total) by mouth daily.   lisinopril-hydrochlorothiazide 20-12.5 MG tablet Commonly known as: ZESTORETIC Take 1 tablet by mouth daily.   meloxicam 15 MG tablet Commonly known as: MOBIC Take 1 tablet (15 mg total) by mouth daily.   metFORMIN 500 MG tablet Commonly known as: GLUCOPHAGE TAKE 1 TABLET BY MOUTH EVERY DAY WITH BREAKFAST   montelukast 10 MG tablet Commonly known as: SINGULAIR TAKE 1 TABLET BY MOUTH EVERYDAY AT BEDTIME   nitroGLYCERIN 0.4 MG SL tablet Commonly known as: NITROSTAT Place 1 tablet (0.4 mg total) under the tongue every 5 (five) minutes as needed for chest pain.   omeprazole 20 MG tablet Commonly known as: PRILOSEC OTC Take 20 mg by mouth daily as needed (acid  reflux).   rosuvastatin 20 MG tablet Commonly known as: CRESTOR Take 1 tablet (20 mg total) by mouth daily.   sertraline 100 MG tablet Commonly known as: ZOLOFT Take 1 tablet (100 mg total) by mouth at bedtime.   triamcinolone 55 MCG/ACT Aero nasal inhaler Commonly known as: NASACORT PLACE 1 SPRAY INTO THE NOSE 2 (TWO) TIMES DAILY AS NEEDED (NASAL CONGESTION).   Vitamin D (Ergocalciferol) 1.25 MG (50000 UNIT) Caps capsule Commonly known as: DRISDOL Take 1 capsule (50,000 Units total) by mouth every 7 (seven) days.        All past medical history, surgical history, allergies, family history, immunizations andmedications were updated in the EMR today and reviewed under the history and medication portions of their EMR.     Recent Results (from the past 2160 hour(s))  HM DIABETES EYE EXAM     Status: None   Collection Time: 02/28/22 12:00 AM  Result Value Ref Range   HM Diabetic Eye Exam No Retinopathy No Retinopathy    ROS 14 pt review of systems performed and negative (unless mentioned in an HPI)  Objective: BP 113/68   Pulse (!) 56   Temp 98 F (36.7 C) (Oral)   Ht 5' 4"$  (1.626 m)   Wt 211 lb (95.7 kg)   SpO2 98%   BMI 36.22 kg/m  Physical Exam Vitals and nursing note reviewed.  Constitutional:      General: She is not in acute distress.    Appearance: Normal appearance. She is not ill-appearing or toxic-appearing.  HENT:     Head: Normocephalic  and atraumatic.     Right Ear: Tympanic membrane, ear canal and external ear normal. There is no impacted cerumen.     Left Ear: Tympanic membrane, ear canal and external ear normal. There is no impacted cerumen.     Nose: No congestion or rhinorrhea.     Mouth/Throat:     Mouth: Mucous membranes are moist.     Pharynx: Oropharynx is clear. No oropharyngeal exudate or posterior oropharyngeal erythema.  Eyes:     General: No scleral icterus.       Right eye: No discharge.        Left eye: No discharge.      Extraocular Movements: Extraocular movements intact.     Conjunctiva/sclera: Conjunctivae normal.     Pupils: Pupils are equal, round, and reactive to light.  Cardiovascular:     Rate and Rhythm: Normal rate and regular rhythm.     Pulses: Normal pulses.     Heart sounds: Normal heart sounds. No murmur heard.    No friction rub. No gallop.  Pulmonary:     Effort: Pulmonary effort is normal. No respiratory distress.     Breath sounds: Normal breath sounds. No stridor. No wheezing, rhonchi or rales.  Chest:     Chest wall: No tenderness.  Abdominal:     General: Abdomen is flat. Bowel sounds are normal. There is no distension.     Palpations: Abdomen is soft. There is no mass.     Tenderness: There is no abdominal tenderness. There is no right CVA tenderness, left CVA tenderness, guarding or rebound.     Hernia: No hernia is present.  Musculoskeletal:        General: No swelling, tenderness or deformity. Normal range of motion.     Cervical back: Normal range of motion and neck supple. No rigidity or tenderness.     Right lower leg: No edema.     Left lower leg: No edema.  Lymphadenopathy:     Cervical: No cervical adenopathy.  Skin:    General: Skin is warm and dry.     Coloration: Skin is not jaundiced or pale.     Findings: No bruising, erythema, lesion or rash.  Neurological:     General: No focal deficit present.     Mental Status: She is alert and oriented to person, place, and time. Mental status is at baseline.     Cranial Nerves: No cranial nerve deficit.     Sensory: No sensory deficit.     Motor: No weakness.     Coordination: Coordination normal.     Gait: Gait normal.     Deep Tendon Reflexes: Reflexes normal.  Psychiatric:        Mood and Affect: Mood normal.        Behavior: Behavior normal.        Thought Content: Thought content normal.        Judgment: Judgment normal.     Diabetic Foot Exam - Simple   No data filed      No results  found.  Assessment/plan: Kristie Owens is a 67 y.o. female present for CPE with combined chronic illness management appointment Osteoarthritis Stable Continue Mobic qd with food.  - continue  OTC voltaren gel/cream.  - discussed cherry tart extract and tumeric natural supplements and their SE potentials  - ANA, RF, CCP, ESR, CRP > WNL   Major depressive disorder, remission status unspecified, unspecified whether recurrent/insomnia Stable Continue Zoloft 100 mg daily.  Discontinued Ambien 2021.   DCd trazodone taper 25-75 mg nightly.> not helpful Dc vistaril> not helpful and felt bad on higher dose.  DC'd belsomra > discontinued> expensive, will low-dose worked okay but not well.    Diabetes type 2: Stable Patient encouraged to continue exercise and dietary modifications.  Continue metformin 500 mg qd a1c improved today 5.7 (was 6.7)> 5.8> 6.6 > 5.9> collected today.   Eye exam up-to-date 02/28/2022 Foot exam completed 03/12/2022 - Microalbumin / creatinine urine ratio> collected today    Essential hypertension/hyperlipidemia/Presence of coronary angioplasty implant and graft/Atherosclerosis of native coronary artery of native heart with other form of angina pectoris (HCC)/Intermediate coronary syndrome (HCC)/ Palpitations/Transient neurologic deficit/Morbid obesity ([place]) Stable Continue daily exercise. Continue low-sodium/heart healthy diet. Continue Imdur> prescribed by cardiology. Continue amlodipine 5 mg daily Continue Coreg 6.25 mg twice daily Continue lisinopril-HCTZ 20-12.5 mg daily Continue Crestor 20 mg daily Continue baby aspirin daily  Allergic rhinitis, unspecified seasonality, unspecified trigger Continue antihistamine of choice.  OTC  Continue Singulair Est with A&A  Breast cancer screening by mammogram - MM 3D SCREEN BREAST BILATERAL; Future  Osteopenia, unspecified location/ Vitamin D deficiency - DG Bone Density; Future - Vitamin D (25  hydroxy)  Routine general medical examination at a health care facility Colonoscopy: completed 2018 at [place], patient states she was told 10-year follow-up due 2028. Mammogram: completed: 06/28/2021 - BCGSO> ordered placed Immunizations: tdap UTD 07/2013, Influenza UTD 2023 (encouraged yearly), PNA series completed, shingrix completed Infectious disease screening: Hep C screening completed DEXA: Estrogen deficient and osteopenia.  Completed 05/2020-1.9, BCGSO> order placed today Patient was encouraged to exercise greater than 150 minutes a week. Patient was encouraged to choose a diet filled with fresh fruits and vegetables, and lean meats. AVS provided to patient today for education/recommendation on gender specific health and safety maintenance.   Return in about 24 weeks (around 08/27/2022) for Routine chronic condition follow-up.  Orders Placed This Encounter  Procedures   MM 3D SCREEN BREAST BILATERAL   DG Bone Density   CBC with Differential/Platelet   Comprehensive metabolic panel   Hemoglobin A1c   Lipid panel   TSH   Vitamin D (25 hydroxy)   Microalbumin / creatinine urine ratio   Meds ordered this encounter  Medications   amLODipine (NORVASC) 5 MG tablet    Sig: Take 1 tablet (5 mg total) by mouth daily.    Dispense:  90 tablet    Refill:  1   carvedilol (COREG) 6.25 MG tablet    Sig: Take 1 tablet (6.25 mg total) by mouth 2 (two) times daily with a meal.    Dispense:  180 tablet    Refill:  1   lisinopril-hydrochlorothiazide (ZESTORETIC) 20-12.5 MG tablet    Sig: Take 1 tablet by mouth daily.    Dispense:  90 tablet    Refill:  1   meloxicam (MOBIC) 15 MG tablet    Sig: Take 1 tablet (15 mg total) by mouth daily.    Dispense:  90 tablet    Refill:  1   metFORMIN (GLUCOPHAGE) 500 MG tablet    Sig: TAKE 1 TABLET BY MOUTH EVERY DAY WITH BREAKFAST    Dispense:  90 tablet    Refill:  1   montelukast (SINGULAIR) 10 MG tablet    Sig: TAKE 1 TABLET BY MOUTH  EVERYDAY AT BEDTIME    Dispense:  90 tablet    Refill:  1   sertraline (ZOLOFT) 100 MG tablet  Sig: Take 1 tablet (100 mg total) by mouth at bedtime.    Dispense:  90 tablet    Refill:  1   Vitamin D, Ergocalciferol, (DRISDOL) 1.25 MG (50000 UNIT) CAPS capsule    Sig: Take 1 capsule (50,000 Units total) by mouth every 7 (seven) days.    Dispense:  12 capsule    Refill:  4   Referral Orders  No referral(s) requested today     Electronically signed by: Howard Pouch, [place]

## 2022-03-12 NOTE — Patient Instructions (Addendum)
Return in about 24 weeks (around 08/27/2022) for Routine chronic condition follow-up.        Great to see you today.  I have refilled the medication(s) we provide.   If labs were collected, we will inform you of lab results once received either by echart message or telephone call.   - echart message- for normal results that have been seen by the patient already.   - telephone call: abnormal results or if patient has not viewed results in their echart.   If you are interested in weight loss counseling please make appt to discuss and bring with you 2 weeks of a food diary/log on notebook paper.  Food log is mandatory on first appt to proceed with counseling.  Weight loss counseling encompasses diet, exercise and can include  medications when appropriate and affordable.  There are routine appts for check-ins and weights to track progress and keep you on track.  Routine check-ins (in person) are also mandatory to continue with prescription refills. Check-in timeline  can range from 4 weeks to 12 weeks, depending on physician's recommendations and which step you are in of your weight loss journey.  Your BMI today is Body mass index is 36.22 kg/m.  Please check with your insurance prior to appt and ask them if they cover weight loss medications for your BMI? And if so, which medications. They may tell you some of the diabetes meds that are used for weight loss also,  are on your formulary- but this does not mean they are covered for weight loss only.  Even if they tell with a prior auth it is covered- make sure they check to see if you personally meet criteria with your BMI.

## 2022-03-13 LAB — MICROALBUMIN / CREATININE URINE RATIO
Creatinine,U: 120.9 mg/dL
Microalb Creat Ratio: 26.6 mg/g (ref 0.0–30.0)
Microalb, Ur: 32.2 mg/dL — ABNORMAL HIGH (ref 0.0–1.9)

## 2022-03-26 ENCOUNTER — Ambulatory Visit: Payer: Managed Care, Other (non HMO) | Admitting: Family Medicine

## 2022-04-02 ENCOUNTER — Ambulatory Visit (INDEPENDENT_AMBULATORY_CARE_PROVIDER_SITE_OTHER): Payer: Managed Care, Other (non HMO) | Admitting: Family Medicine

## 2022-04-02 ENCOUNTER — Encounter: Payer: Self-pay | Admitting: Family Medicine

## 2022-04-02 DIAGNOSIS — E785 Hyperlipidemia, unspecified: Secondary | ICD-10-CM | POA: Diagnosis not present

## 2022-04-02 DIAGNOSIS — E1169 Type 2 diabetes mellitus with other specified complication: Secondary | ICD-10-CM

## 2022-04-02 MED ORDER — TIRZEPATIDE 2.5 MG/0.5ML ~~LOC~~ SOAJ: 2.5000 mg | SUBCUTANEOUS | 0 refills | Status: DC

## 2022-04-02 MED ORDER — TIRZEPATIDE 5 MG/0.5ML ~~LOC~~ SOAJ: 5.0000 mg | SUBCUTANEOUS | 1 refills | Status: DC

## 2022-04-02 NOTE — Progress Notes (Signed)
Kristie Owens , [birth year], 67 y.o., female MRN: [id number] Patient Care Team    Relationship Specialty Notifications Start End  Ma Hillock, DO PCP - General Family Medicine  06/09/19   Jerline Pain, MD PCP - Cardiology Cardiology  12/17/19   Jerline Pain, MD Consulting Physician Cardiology  06/08/19   Otelia Sergeant, OD Referring Physician   06/11/19   Ralene Muskrat Physician Assistant Chiropractic Medicine  06/11/19   Otelia Sergeant, OD Referring Physician   03/13/21     Chief Complaint  Patient presents with   Obesity    Diabetes type 2 with obesity     Subjective: Kristie Owens is a 67 y.o. Pt presents for an OV to discuss her progressing diabetes with an A1c of 7.1 and her morbid obesity.  Patient has been compliant with her metformin.  Diabetes type 2 with morbid obesity BMI: 35.77 Lbs: 208  Patient brings with her a food diary, which was reviewed with her today. Patient does not exercise routinely until recently she started chair yoga. She has difficulty working water back into her daily regimen.  She is taking in approximately 32 ounces a day.     03/12/2022    8:04 AM 08/28/2021    9:04 AM 03/13/2021   11:15 AM 11/21/2020    9:06 AM 06/06/2020    8:19 AM  Depression screen PHQ 2/9  Decreased Interest 0 0 0 0 0  Down, Depressed, Hopeless 0 0 0 0 1  PHQ - 2 Score 0 0 0 0 1  Altered sleeping '3 2 3 3 3  '$ Tired, decreased energy '1 1 1 1 2  '$ Change in appetite 0 0 2 0 0  Feeling bad or failure about yourself  0 0 0 0 0  Trouble concentrating 0 0 0 0 0  Moving slowly or fidgety/restless 0 0 0 0 0  Suicidal thoughts 0 0 0 0 0  PHQ-9 Score '4 3 6 4 6  '$ Difficult doing work/chores     Not difficult at all    Allergies  Allergen Reactions   Bupropion Nausea Only and Other (See Comments)    Nausea and headaches    Sulfa Antibiotics Other (See Comments)    From childhood- reaction not recalled   Prednisone Anxiety and Other (See Comments)    "Made me  not feel like myself, altered"   Social History   Social History Narrative   Marital status/children/pets: Married.  One child.   Education/employment: Retired.  Associates degree in college.   Safety:      -Wears a bicycle helmet riding a bike: Yes     -smoke alarm in the home:Yes     - wears seatbelt: Yes     - Feels safe in their relationships: Yes   Past Medical History:  Diagnosis Date   Achilles bursitis or tendinitis 11/11/2012   right    Allergy    Angina at rest    Dr Marlou Porch   CAD (coronary artery disease), native coronary artery    DES to LAD in 2 areas, one at the bifurcation of a diagonal branch and also in the mid to distal region. Her diagonal branch was also ballooned . She has a DES to RCA 10/10. Her circumflex artery mild ostial disease.   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   Tremor 03/23/2020   Past Surgical History:  Procedure Laterality Date   BREAST EXCISIONAL BIOPSY Right 1995   CAROTID STENT     CESAREAN SECTION  1983   FOOT SURGERY     LEFT HEART CATH AND CORONARY ANGIOGRAPHY N/A 06/04/2016   Procedure: Left Heart Cath and Coronary Angiography;  Surgeon: Belva Crome, MD;  Location: [facility];  Service: Cardiovascular;  Laterality: N/A;   LEFT HEART CATH AND CORONARY ANGIOGRAPHY N/A 12/04/2019   Procedure: LEFT HEART CATH AND CORONARY ANGIOGRAPHY;  Surgeon: Burnell Blanks, MD;  Location: [facility];  Service: Cardiovascular;  Laterality: N/A;   LEFT  HEART CATHETERIZATION WITH CORONARY ANGIOGRAM N/A 01/16/2011   Procedure: LEFT HEART CATHETERIZATION WITH CORONARY ANGIOGRAM;  Surgeon: Candee Furbish, MD;  Location: [facility] CATH LAB;  Service: Cardiovascular;  Laterality: N/A;   NOSE SURGERY     SINOSCOPY     Family History  Problem Relation Age of Onset   Hyperlipidemia Brother    Hypertension Brother    Intellectual disability Brother    Kidney disease Brother    Hypertension Mother    Heart disease Mother    COPD Mother    Ulcers Mother    Lung cancer Father    Heart failure Paternal Grandmother    Heart attack Paternal Grandmother    Hypertension Paternal Grandmother    Hyperlipidemia Paternal Grandmother    Heart disease Paternal Grandmother    Heart attack Brother    Asthma Brother    Early death Brother    Hyperlipidemia Brother    Hypertension Brother    Intellectual disability Brother    Kidney disease Brother    Heart disease Brother    Hypertension Sister    Diabetes Sister    Early death Maternal Grandmother    Tuberculosis Maternal Grandmother    Lung cancer Maternal Grandfather    Suicidality Maternal Grandfather    Early death Maternal Grandfather    Heart disease Paternal Grandfather    Hyperlipidemia Paternal Grandfather    Hypertension Paternal Grandfather    Heart attack Other    Arthritis Sister    Depression Sister    Miscarriages / Stillbirths Sister    Alcohol abuse Brother    Stroke Neg Hx    Allergies as of 04/02/2022       Reactions   Bupropion Nausea Only, Other (See Comments)   Nausea and headaches    Sulfa Antibiotics Other (See Comments)   From childhood- reaction not recalled   Prednisone Anxiety, Other (See Comments)   "Made me not feel like myself, altered"        Medication List        Accurate as of April 02, 2022 11:36 AM. If you have any questions, ask your nurse or doctor.          amLODipine 5 MG tablet Commonly known as: NORVASC Take 1 tablet (5 mg total) by mouth  daily.   aspirin EC  81 MG tablet Take 81 mg by mouth daily.   budesonide 0.5 MG/2ML nebulizer solution Commonly known as: Pulmicort Use twice a day in a sinus rinse bottle.   carvedilol 6.25 MG tablet Commonly known as: COREG Take 1 tablet (6.25 mg total) by mouth 2 (two) times daily with a meal.   cetirizine 10 MG tablet Commonly known as: ZyrTEC Allergy Take 1 tablet (10 mg total) by mouth daily.   cromolyn 4 % ophthalmic solution Commonly known as: OPTICROM Place 1 drop into both eyes 4 (four) times daily as needed (itchy/watery eyes).   isosorbide mononitrate 60 MG 24 hr tablet Commonly known as: IMDUR Take 1 tablet (60 mg total) by mouth daily.   lisinopril-hydrochlorothiazide 20-12.5 MG tablet Commonly known as: ZESTORETIC Take 1 tablet by mouth daily.   meloxicam 15 MG tablet Commonly known as: MOBIC Take 1 tablet (15 mg total) by mouth daily.   metFORMIN 500 MG tablet Commonly known as: GLUCOPHAGE TAKE 1 TABLET BY MOUTH EVERY DAY WITH BREAKFAST   montelukast 10 MG tablet Commonly known as: SINGULAIR TAKE 1 TABLET BY MOUTH EVERYDAY AT BEDTIME   nitroGLYCERIN 0.4 MG SL tablet Commonly known as: NITROSTAT Place 1 tablet (0.4 mg total) under the tongue every 5 (five) minutes as needed for chest pain.   omeprazole 20 MG tablet Commonly known as: PRILOSEC OTC Take 20 mg by mouth daily as needed (acid reflux).   rosuvastatin 20 MG tablet Commonly known as: CRESTOR Take 1 tablet (20 mg total) by mouth daily.   sertraline 100 MG tablet Commonly known as: ZOLOFT Take 1 tablet (100 mg total) by mouth at bedtime.   tirzepatide 2.5 MG/0.5ML Pen Commonly known as: MOUNJARO Inject 2.5 mg into the skin once a week. Started by: Howard Pouch, DO   tirzepatide 5 MG/0.5ML Pen Commonly known as: MOUNJARO Inject 5 mg into the skin once a week. Started by: Howard Pouch, DO   triamcinolone 55 MCG/ACT Aero nasal inhaler Commonly known as: NASACORT PLACE 1 SPRAY  INTO THE NOSE 2 (TWO) TIMES DAILY AS NEEDED (NASAL CONGESTION).   Vitamin D (Ergocalciferol) 1.25 MG (50000 UNIT) Caps capsule Commonly known as: DRISDOL Take 1 capsule (50,000 Units total) by mouth every 7 (seven) days.        All past medical history, surgical history, allergies, family history, immunizations andmedications were updated in the EMR today and reviewed under the history and medication portions of their EMR.     ROS Negative, with the exception of above mentioned in HPI   Objective:  BP 138/76   Pulse 72   Temp 98.7 F (37.1 C)   Ht '5\' 4"'$  (1.626 m)   Wt 208 lb 6.4 oz (94.5 kg)   SpO2 96%   BMI 35.77 kg/m  Body mass index is 35.77 kg/m. Physical Exam Vitals and nursing note reviewed.  Constitutional:      General: She is not in acute distress.    Appearance: Normal appearance. She is not toxic-appearing.  HENT:     Head: Normocephalic and atraumatic.  Eyes:     General: No scleral icterus.       Right eye: No discharge.        Left eye: No discharge.     Conjunctiva/sclera: Conjunctivae normal.  Pulmonary:     Effort: Pulmonary effort is normal.  Musculoskeletal:     Cervical back: Normal range of motion.  Skin:    Findings: No rash.  Neurological:     Mental Status:  She is alert and oriented to person, place, and time. Mental status is at baseline.  Psychiatric:        Mood and Affect: Mood normal.        Behavior: Behavior normal.        Thought Content: Thought content normal.        Judgment: Judgment normal.     No results found. No results found. No results found for this or any previous visit (from the past 24 hour(s)).  Assessment/Plan: MARJORY KLIMAS is a 67 y.o. female present for OV for  Type 2 diabetes mellitus with hyperlipidemia (HCC)/ Morbid obesity ([place]) A1C now 7.1.(rising) Continue metformin Start Mounjaro 2.5 mg weekly for 4 weeks, then increase to 5 mg daily. Exam up-to-date 08/28/2021 Eye exam up-to-date  02/28/2022 GFR up-to-date 03/12/2022 Urine protein up-to-date 03/12/2022 Exercise and nutrition counseling was performed today. Patient was counseled on exercise, calorie counting, weight loss and potential medications to help with weight loss today. -Patient was provided with online resources for: Weekly net calorie calculator.  Applications for calorie counting.  Patient was advised to ensure she is taking in adequate nutrition daily by meeting calorie goals. -Patient was educated on dietary changes to not only lose weight but to eat healthy.  Patient was educated on glycemic index. -Patient was educated on exercise goal of 150 minutes a week (plus warm up and cool down) of cardiovascular exercise.  Patient was educated on heart rate for cardiovascular and fat burning zones. -Patient was encouraged to maintain adequate water consumption of at least 100 ounces a day, more if exercising/sweating. F/u 4 weeks w/ provider.  She was encouraged to make a nurse visit for proper injection tutorial when she picks up the [facility].  Reviewed expectations re: course of current medical issues. Discussed self-management of symptoms. Outlined signs and symptoms indicating need for more acute intervention. Patient verbalized understanding and all questions were answered. Patient received an After-Visit Summary.    No orders of the defined types were placed in this encounter.  Meds ordered this encounter  Medications   tirzepatide (MOUNJARO) 2.5 MG/0.5ML Pen    Sig: Inject 2.5 mg into the skin once a week.    Dispense:  2 mL    Refill:  0    #1:2   tirzepatide (MOUNJARO) 5 MG/0.5ML Pen    Sig: Inject 5 mg into the skin once a week.    Dispense:  6 mL    Refill:  1    #2:2   Referral Orders  No referral(s) requested today     Note is dictated utilizing voice recognition software. Although note has been proof read prior to signing, occasional typographical errors still can be missed. If any  questions arise, please do not hesitate to call for verification.   electronically signed by:  Howard Pouch, DO  [place]

## 2022-04-02 NOTE — Patient Instructions (Addendum)
Weekly net calorie calculator.   Applications for calorie counting.   glycemic index. exercise goal of 150 minutes a week (plus warm up and cool down) of cardiovascular exercise.   maintain adequate water consumption of at least 100 ounces a day  BMI: 35.77 Lbs: 208

## 2022-04-30 ENCOUNTER — Ambulatory Visit: Payer: Managed Care, Other (non HMO) | Admitting: Family Medicine

## 2022-05-07 ENCOUNTER — Encounter: Payer: Self-pay | Admitting: Family Medicine

## 2022-05-07 ENCOUNTER — Ambulatory Visit (INDEPENDENT_AMBULATORY_CARE_PROVIDER_SITE_OTHER): Payer: Managed Care, Other (non HMO) | Admitting: Family Medicine

## 2022-05-07 DIAGNOSIS — E1169 Type 2 diabetes mellitus with other specified complication: Secondary | ICD-10-CM | POA: Diagnosis not present

## 2022-05-07 DIAGNOSIS — Z713 Dietary counseling and surveillance: Secondary | ICD-10-CM

## 2022-05-07 DIAGNOSIS — E785 Hyperlipidemia, unspecified: Secondary | ICD-10-CM | POA: Diagnosis not present

## 2022-05-07 NOTE — Progress Notes (Signed)
Kristie Owens , December 05, 1955, 67 y.o., female MRN: [id number] Patient Care Team    Relationship Specialty Notifications Start End  Natalia Leatherwood, DO PCP - General Family Medicine  06/09/19   Jake Bathe, MD PCP - Cardiology Cardiology  12/17/19   Jake Bathe, MD Consulting Physician Cardiology  06/08/19   Jill Side, OD Referring Physician   06/11/19   Moody Bruins Physician Assistant Chiropractic Medicine  06/11/19   Jill Side, OD Referring Physician   03/13/21     Chief Complaint  Patient presents with   Diabetes   morbid obesity     Subjective: Kristie Owens is a 67 y.o. Pt presents for an OV to discuss her progressing diabetes with an A1c of 7.1 and her morbid obesity.  Patient has been compliant with her metformin.  Diabetes type 2 with morbid obesity BMI: 35.77> 34.9 Lbs: 208> 203  Since initial visit patient reports she has changed her diet. Avoiding bread, pasta, soda and sugar. She has not been able to start exercising. She has been caring for a family friend. She has not started mounjaro yet. She had heard it can cause gastroparesis and had concerns.  Prior note: Patient brings with her a food diary, which was reviewed with her today. Patient does not exercise routinely until recently she started chair yoga. She has difficulty working water back into her daily regimen.  She is taking in approximately 32 ounces a day.     03/12/2022    8:04 AM 08/28/2021    9:04 AM 03/13/2021   11:15 AM 11/21/2020    9:06 AM 06/06/2020    8:19 AM  Depression screen PHQ 2/9  Decreased Interest 0 0 0 0 0  Down, Depressed, Hopeless 0 0 0 0 1  PHQ - 2 Score 0 0 0 0 1  Altered sleeping 3 2 3 3 3   Tired, decreased energy 1 1 1 1 2   Change in appetite 0 0 2 0 0  Feeling bad or failure about yourself  0 0 0 0 0  Trouble concentrating 0 0 0 0 0  Moving slowly or fidgety/restless 0 0 0 0 0  Suicidal thoughts 0 0 0 0 0  PHQ-9 Score 4 3 6 4 6   Difficult doing  work/chores     Not difficult at all    Allergies  Allergen Reactions   Bupropion Nausea Only and Other (See Comments)    Nausea and headaches    Sulfa Antibiotics Other (See Comments)    From childhood- reaction not recalled   Prednisone Anxiety and Other (See Comments)    "Made me not feel like myself, altered"   Social History   Social History Narrative   Marital status/children/pets: Married.  One child.   Education/employment: Retired.  Associates degree in college.   Safety:      -Wears a bicycle helmet riding a bike: Yes     -smoke alarm in the home:Yes     - wears seatbelt: Yes     - Feels safe in their relationships: Yes   Past Medical History:  Diagnosis Date   Achilles bursitis or tendinitis 11/11/2012   right    Allergy    Angina at rest    Dr Anne Fu   CAD (coronary artery disease), native coronary artery    DES to LAD in 2 areas, one at the bifurcation of a diagonal branch and also in the mid  to distal region. Her diagonal branch was also ballooned . She has a DES to RCA 10/10. Her circumflex artery mild ostial disease.   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   Tremor 03/23/2020   Past Surgical History:  Procedure Laterality Date   BREAST EXCISIONAL BIOPSY Right 1995   CAROTID STENT     CESAREAN SECTION  1983   FOOT SURGERY     LEFT HEART CATH AND CORONARY ANGIOGRAPHY N/A 06/04/2016   Procedure: Left Heart Cath and Coronary Angiography;  Surgeon: Lyn Records, MD;  Location: [facility] INVASIVE CV  LAB;  Service: Cardiovascular;  Laterality: N/A;   LEFT HEART CATH AND CORONARY ANGIOGRAPHY N/A 12/04/2019   Procedure: LEFT HEART CATH AND CORONARY ANGIOGRAPHY;  Surgeon: Kathleene Hazel, MD;  Location: [facility] INVASIVE CV LAB;  Service: Cardiovascular;  Laterality: N/A;   LEFT HEART CATHETERIZATION WITH CORONARY ANGIOGRAM N/A 01/16/2011   Procedure: LEFT HEART CATHETERIZATION WITH CORONARY ANGIOGRAM;  Surgeon: Donato Schultz, MD;  Location: [facility] CATH LAB;  Service: Cardiovascular;  Laterality: N/A;   NOSE SURGERY     SINOSCOPY     Family History  Problem Relation Age of Onset   Hyperlipidemia Brother    Hypertension Brother    Intellectual disability Brother    Kidney disease Brother    Hypertension Mother    Heart disease Mother    COPD Mother    Ulcers Mother    Lung cancer Father    Heart failure Paternal Grandmother    Heart attack Paternal Grandmother    Hypertension Paternal Grandmother    Hyperlipidemia Paternal Grandmother    Heart disease Paternal Grandmother    Heart attack Brother    Asthma Brother    Early death Brother    Hyperlipidemia Brother    Hypertension Brother    Intellectual disability Brother    Kidney disease Brother    Heart disease Brother    Hypertension Sister    Diabetes Sister    Early death Maternal Grandmother    Tuberculosis Maternal Grandmother    Lung cancer Maternal Grandfather    Suicidality Maternal Grandfather    Early death Maternal Grandfather    Heart disease Paternal Grandfather    Hyperlipidemia Paternal Grandfather    Hypertension Paternal Grandfather    Heart attack Other    Arthritis Sister    Depression Sister    Miscarriages / Stillbirths Sister    Alcohol abuse Brother    Stroke Neg Hx    Allergies as of 05/07/2022       Reactions   Bupropion Nausea Only, Other (See Comments)   Nausea and headaches    Sulfa Antibiotics Other (See Comments)   From childhood- reaction not recalled   Prednisone Anxiety, Other (See  Comments)   "Made me not feel like myself, altered"        Medication List  Accurate as of May 07, 2022 10:53 AM. If you have any questions, ask your nurse or doctor.          amLODipine 5 MG tablet Commonly known as: NORVASC Take 1 tablet (5 mg total) by mouth daily.   aspirin EC 81 MG tablet Take 81 mg by mouth daily.   budesonide 0.5 MG/2ML nebulizer solution Commonly known as: Pulmicort Use twice a day in a sinus rinse bottle.   carvedilol 6.25 MG tablet Commonly known as: COREG Take 1 tablet (6.25 mg total) by mouth 2 (two) times daily with a meal.   cetirizine 10 MG tablet Commonly known as: ZyrTEC Allergy Take 1 tablet (10 mg total) by mouth daily.   cromolyn 4 % ophthalmic solution Commonly known as: OPTICROM Place 1 drop into both eyes 4 (four) times daily as needed (itchy/watery eyes).   isosorbide mononitrate 60 MG 24 hr tablet Commonly known as: IMDUR Take 1 tablet (60 mg total) by mouth daily.   lisinopril-hydrochlorothiazide 20-12.5 MG tablet Commonly known as: ZESTORETIC Take 1 tablet by mouth daily.   meloxicam 15 MG tablet Commonly known as: MOBIC Take 1 tablet (15 mg total) by mouth daily.   metFORMIN 500 MG tablet Commonly known as: GLUCOPHAGE TAKE 1 TABLET BY MOUTH EVERY DAY WITH BREAKFAST   montelukast 10 MG tablet Commonly known as: SINGULAIR TAKE 1 TABLET BY MOUTH EVERYDAY AT BEDTIME   nitroGLYCERIN 0.4 MG SL tablet Commonly known as: NITROSTAT Place 1 tablet (0.4 mg total) under the tongue every 5 (five) minutes as needed for chest pain.   omeprazole 20 MG tablet Commonly known as: PRILOSEC OTC Take 20 mg by mouth daily as needed (acid reflux).   rosuvastatin 20 MG tablet Commonly known as: CRESTOR Take 1 tablet (20 mg total) by mouth daily.   sertraline 100 MG tablet Commonly known as: ZOLOFT Take 1 tablet (100 mg total) by mouth at bedtime.   tirzepatide 2.5 MG/0.5ML Pen Commonly known as: MOUNJARO Inject  2.5 mg into the skin once a week.   tirzepatide 5 MG/0.5ML Pen Commonly known as: MOUNJARO Inject 5 mg into the skin once a week.   triamcinolone 55 MCG/ACT Aero nasal inhaler Commonly known as: NASACORT PLACE 1 SPRAY INTO THE NOSE 2 (TWO) TIMES DAILY AS NEEDED (NASAL CONGESTION).   Vitamin D (Ergocalciferol) 1.25 MG (50000 UNIT) Caps capsule Commonly known as: DRISDOL Take 1 capsule (50,000 Units total) by mouth every 7 (seven) days.        All past medical history, surgical history, allergies, family history, immunizations andmedications were updated in the EMR today and reviewed under the history and medication portions of their EMR.     ROS Negative, with the exception of above mentioned in HPI   Objective:  BP 122/65   Pulse 61   Temp 98.3 F (36.8 C)   Wt 203 lb 12.8 oz (92.4 kg)   SpO2 95%   BMI 34.98 kg/m  Body mass index is 34.98 kg/m. Physical Exam Vitals and nursing note reviewed.  Constitutional:      General: She is not in acute distress.    Appearance: Normal appearance. She is not toxic-appearing.  HENT:     Head: Normocephalic and atraumatic.  Eyes:     General: No scleral icterus.       Right eye: No discharge.        Left eye: No discharge.     Conjunctiva/sclera: Conjunctivae normal.  Pulmonary:     Effort: Pulmonary  effort is normal.  Musculoskeletal:     Cervical back: Normal range of motion.  Skin:    Findings: No rash.  Neurological:     Mental Status: She is alert and oriented to person, place, and time. Mental status is at baseline.  Psychiatric:        Mood and Affect: Mood normal.        Behavior: Behavior normal.        Thought Content: Thought content normal.        Judgment: Judgment normal.     No results found. No results found. No results found for this or any previous visit (from the past 24 hour(s)).  Assessment/Plan: Kristie Owens is a 67 y.o. female present for OV for  Type 2 diabetes mellitus with  hyperlipidemia (HCC)/ Morbid obesity (HCC) Lost 5 lbs by changing diet only! A1C now 7.1.(rising) Continue metformin Start Mounjaro 2.5 mg weekly for 4 weeks, then increase to 5 mg daily. Exam up-to-date 08/28/2021 Eye exam up-to-date 02/28/2022 GFR up-to-date 03/12/2022 Urine protein up-to-date 03/12/2022 Exercise and nutrition counseling was performed today. Patient was counseled on exercise, calorie counting, weight loss and potential medications to help with weight loss today. -Patient was provided with online resources for: Weekly net calorie calculator.  Applications for calorie counting.  Patient was advised to ensure she is taking in adequate nutrition daily by meeting calorie goals. -Patient was educated on dietary changes to not only lose weight but to eat healthy.  Patient was educated on glycemic index. -Patient was educated on exercise goal of 150 minutes a week (plus warm up and cool down) of cardiovascular exercise.  Patient was educated on heart rate for cardiovascular and fat burning zones. -Patient was encouraged to maintain adequate water consumption of at least 100 ounces a day, more if exercising/sweating. F/u 6 weeks w/ provider.  She was encouraged to make a nurse visit for proper injection tutorial when she picks up the [facility] (discussion on medication profile/safety today)  Reviewed expectations re: course of current medical issues. Discussed self-management of symptoms. Outlined signs and symptoms indicating need for more acute intervention. Patient verbalized understanding and all questions were answered. Patient received an After-Visit Summary.    No orders of the defined types were placed in this encounter.  No orders of the defined types were placed in this encounter.  Referral Orders  No referral(s) requested today     Note is dictated utilizing voice recognition software. Although note has been proof read prior to signing, occasional typographical  errors still can be missed. If any questions arise, please do not hesitate to call for verification.   electronically signed by:  Felix Pacini, DO  [facility] [place]

## 2022-05-07 NOTE — Patient Instructions (Signed)
  4-6 weeks follow

## 2022-05-11 ENCOUNTER — Ambulatory Visit: Payer: Managed Care, Other (non HMO)

## 2022-05-14 ENCOUNTER — Ambulatory Visit: Payer: Managed Care, Other (non HMO)

## 2022-05-14 NOTE — Progress Notes (Signed)
Pt was given proper instructions of how to perform injection at home for [facility], proper disposal, and if she had any major side effects such as nausea, vomiting, diarrhea or abdominal pain to let us know. Pt confirmed she understood and had no further questions

## 2022-05-31 ENCOUNTER — Telehealth: Payer: Self-pay | Admitting: Family Medicine

## 2022-05-31 NOTE — Telephone Encounter (Signed)
Patient states she has been experiencing nausea and diarrhea since Sunday. She believes it is from the [place]. She is scheduled for tomorrow at 2:20.

## 2022-05-31 NOTE — Telephone Encounter (Signed)
noted 

## 2022-06-01 ENCOUNTER — Ambulatory Visit: Payer: Managed Care, Other (non HMO) | Admitting: Family Medicine

## 2022-06-01 ENCOUNTER — Encounter: Payer: Self-pay | Admitting: Family Medicine

## 2022-06-01 VITALS — BP 116/77 | HR 108 | Temp 99.4°F | Wt 196.0 lb

## 2022-06-01 DIAGNOSIS — R112 Nausea with vomiting, unspecified: Secondary | ICD-10-CM | POA: Diagnosis not present

## 2022-06-01 DIAGNOSIS — K529 Noninfective gastroenteritis and colitis, unspecified: Secondary | ICD-10-CM | POA: Diagnosis not present

## 2022-06-01 MED ORDER — ONDANSETRON HCL 4 MG/2ML IJ SOLN
4.0000 mg | Freq: Once | INTRAMUSCULAR | Status: AC
  Administered 2022-06-01: 4 mg via INTRAMUSCULAR

## 2022-06-01 MED ORDER — DIPHENOXYLATE-ATROPINE 2.5-0.025 MG PO TABS: ORAL_TABLET | ORAL | 0 refills | Status: DC

## 2022-06-01 MED ORDER — ONDANSETRON HCL 4 MG/5ML PO SOLN: 4.0000 mg | Freq: Once | ORAL | Status: DC

## 2022-06-01 MED ORDER — AZITHROMYCIN 500 MG PO TABS: 500.0000 mg | ORAL_TABLET | Freq: Every day | ORAL | 0 refills | Status: AC

## 2022-06-01 MED ORDER — ONDANSETRON 4 MG PO TBDP: 4.0000 mg | ORAL_TABLET | Freq: Three times a day (TID) | ORAL | 0 refills | Status: DC | PRN

## 2022-06-01 MED ORDER — DICYCLOMINE HCL 10 MG PO CAPS: 10.0000 mg | ORAL_CAPSULE | Freq: Three times a day (TID) | ORAL | 0 refills | Status: DC

## 2022-06-01 NOTE — Progress Notes (Signed)
Kristie Owens , 03/13/1955, 67 y.o., female MRN: [id number] Patient Care Team    Relationship Specialty Notifications Start End  Natalia Leatherwood, DO PCP - General Family Medicine  06/09/19   Jake Bathe, MD PCP - Cardiology Cardiology  12/17/19   Jake Bathe, MD Consulting Physician Cardiology  06/08/19   Jill Side, OD Referring Physician   06/11/19   Moody Bruins Physician Assistant Chiropractic Medicine  06/11/19   Jill Side, OD Referring Physician   03/13/21     Chief Complaint  Patient presents with   GI Problem    Nausea and constant diarrhea; Started Sunday, got better then started again on Wed     Subjective: Kristie Owens is a 67 y.o. Pt presents for an OV with complaints of nausea and diarrhea.  Symptoms started on Sunday.  She reports she got better for about a day and then symptoms rebounded rather quickly.  She is able to intake p.o., but she states it goes right through her and she is having 2-3 watery stools an hour.  She has had a low-grade fever, feels very weak.  She initially thought maybe she had eaten some chicken that was left out too long.  Her husband is with her today he did not eat the chicken.  She is also on Mounjaro at 2.5 mg doses she did not have any difficulty taking the medication and is on her fourth dose.     03/12/2022    8:04 AM 08/28/2021    9:04 AM 03/13/2021   11:15 AM 11/21/2020    9:06 AM 06/06/2020    8:19 AM  Depression screen PHQ 2/9  Decreased Interest 0 0 0 0 0  Down, Depressed, Hopeless 0 0 0 0 1  PHQ - 2 Score 0 0 0 0 1  Altered sleeping 3 2 3 3 3   Tired, decreased energy 1 1 1 1 2   Change in appetite 0 0 2 0 0  Feeling bad or failure about yourself  0 0 0 0 0  Trouble concentrating 0 0 0 0 0  Moving slowly or fidgety/restless 0 0 0 0 0  Suicidal thoughts 0 0 0 0 0  PHQ-9 Score 4 3 6 4 6   Difficult doing work/chores     Not difficult at all    Allergies  Allergen Reactions   Bupropion Nausea Only and  Other (See Comments)    Nausea and headaches    Sulfa Antibiotics Other (See Comments)    From childhood- reaction not recalled   Prednisone Anxiety and Other (See Comments)    "Made me not feel like myself, altered"   Social History   Social History Narrative   Marital status/children/pets: Married.  One child.   Education/employment: Retired.  Associates degree in college.   Safety:      -Wears a bicycle helmet riding a bike: Yes     -smoke alarm in the home:Yes     - wears seatbelt: Yes     - Feels safe in their relationships: Yes   Past Medical History:  Diagnosis Date   Achilles bursitis or tendinitis 11/11/2012   right    Allergy    Angina at rest    Dr Anne Fu   CAD (coronary artery disease), native coronary artery    DES to LAD in 2 areas, one at the bifurcation of a diagonal branch and also in the mid to distal region.  Her diagonal branch was also ballooned . She has a DES to RCA 10/10. Her circumflex artery mild ostial disease.   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   Tremor 03/23/2020   Past Surgical History:  Procedure Laterality Date   BREAST EXCISIONAL BIOPSY Right 1995   CAROTID STENT     CESAREAN SECTION  1983   FOOT SURGERY     LEFT HEART CATH AND CORONARY ANGIOGRAPHY N/A 06/04/2016   Procedure: Left Heart Cath and Coronary Angiography;  Surgeon: Lyn Records, MD;  Location: [facility] INVASIVE CV LAB;  Service: Cardiovascular;  Laterality: N/A;   LEFT HEART CATH AND CORONARY ANGIOGRAPHY N/A  12/04/2019   Procedure: LEFT HEART CATH AND CORONARY ANGIOGRAPHY;  Surgeon: Kathleene Hazel, MD;  Location: [facility] INVASIVE CV LAB;  Service: Cardiovascular;  Laterality: N/A;   LEFT HEART CATHETERIZATION WITH CORONARY ANGIOGRAM N/A 01/16/2011   Procedure: LEFT HEART CATHETERIZATION WITH CORONARY ANGIOGRAM;  Surgeon: Donato Schultz, MD;  Location: [facility] CATH LAB;  Service: Cardiovascular;  Laterality: N/A;   NOSE SURGERY     SINOSCOPY     Family History  Problem Relation Age of Onset   Hyperlipidemia Brother    Hypertension Brother    Intellectual disability Brother    Kidney disease Brother    Hypertension Mother    Heart disease Mother    COPD Mother    Ulcers Mother    Lung cancer Father    Heart failure Paternal Grandmother    Heart attack Paternal Grandmother    Hypertension Paternal Grandmother    Hyperlipidemia Paternal Grandmother    Heart disease Paternal Grandmother    Heart attack Brother    Asthma Brother    Early death Brother    Hyperlipidemia Brother    Hypertension Brother    Intellectual disability Brother    Kidney disease Brother    Heart disease Brother    Hypertension Sister    Diabetes Sister    Early death Maternal Grandmother    Tuberculosis Maternal Grandmother    Lung cancer Maternal Grandfather    Suicidality Maternal Grandfather    Early death Maternal Grandfather    Heart disease Paternal Grandfather    Hyperlipidemia Paternal Grandfather    Hypertension Paternal Grandfather    Heart attack Other    Arthritis Sister    Depression Sister    Miscarriages / Stillbirths Sister    Alcohol abuse Brother    Stroke Neg Hx    Allergies as of 06/01/2022       Reactions   Bupropion Nausea Only, Other (See Comments)   Nausea and headaches    Sulfa Antibiotics Other (See Comments)   From childhood- reaction not recalled   Prednisone Anxiety, Other (See Comments)   "Made me not feel like myself, altered"        Medication List        Accurate  as  of Jun 01, 2022  4:43 PM. If you have any questions, ask your nurse or doctor.          amLODipine 5 MG tablet Commonly known as: NORVASC Take 1 tablet (5 mg total) by mouth daily.   aspirin EC 81 MG tablet Take 81 mg by mouth daily.   azithromycin 500 MG tablet Commonly known as: Zithromax Take 1 tablet (500 mg total) by mouth daily for 3 days. Started by: Felix Pacini, DO   budesonide 0.5 MG/2ML nebulizer solution Commonly known as: Pulmicort Use twice a day in a sinus rinse bottle.   carvedilol 6.25 MG tablet Commonly known as: COREG Take 1 tablet (6.25 mg total) by mouth 2 (two) times daily with a meal.   cetirizine 10 MG tablet Commonly known as: ZyrTEC Allergy Take 1 tablet (10 mg total) by mouth daily.   cromolyn 4 % ophthalmic solution Commonly known as: OPTICROM Place 1 drop into both eyes 4 (four) times daily as needed (itchy/watery eyes).   dicyclomine 10 MG capsule Commonly known as: BENTYL Take 1 capsule (10 mg total) by mouth 4 (four) times daily -  before meals and at bedtime. Started by: Felix Pacini, DO   diphenoxylate-atropine 2.5-0.025 MG tablet Commonly known as: Lomotil 2 tabs followed by 1 tab in 4-6 hours if needed. Started by: Felix Pacini, DO   isosorbide mononitrate 60 MG 24 hr tablet Commonly known as: IMDUR Take 1 tablet (60 mg total) by mouth daily.   lisinopril-hydrochlorothiazide 20-12.5 MG tablet Commonly known as: ZESTORETIC Take 1 tablet by mouth daily.   meloxicam 15 MG tablet Commonly known as: MOBIC Take 1 tablet (15 mg total) by mouth daily.   metFORMIN 500 MG tablet Commonly known as: GLUCOPHAGE TAKE 1 TABLET BY MOUTH EVERY DAY WITH BREAKFAST   montelukast 10 MG tablet Commonly known as: SINGULAIR TAKE 1 TABLET BY MOUTH EVERYDAY AT BEDTIME   nitroGLYCERIN 0.4 MG SL tablet Commonly known as: NITROSTAT Place 1 tablet (0.4 mg total) under the tongue every 5 (five) minutes as needed for chest pain.   omeprazole  20 MG tablet Commonly known as: PRILOSEC OTC Take 20 mg by mouth daily as needed (acid reflux).   ondansetron 4 MG disintegrating tablet Commonly known as: ZOFRAN-ODT Take 1 tablet (4 mg total) by mouth every 8 (eight) hours as needed for nausea or vomiting. Started by: Felix Pacini, DO   rosuvastatin 20 MG tablet Commonly known as: CRESTOR Take 1 tablet (20 mg total) by mouth daily.   sertraline 100 MG tablet Commonly known as: ZOLOFT Take 1 tablet (100 mg total) by mouth at bedtime.   tirzepatide 2.5 MG/0.5ML Pen Commonly known as: MOUNJARO Inject 2.5 mg into the skin once a week.   tirzepatide 5 MG/0.5ML Pen Commonly known as: MOUNJARO Inject 5 mg into the skin once a week.   triamcinolone 55 MCG/ACT Aero nasal inhaler Commonly known as: NASACORT PLACE 1 SPRAY INTO THE NOSE 2 (TWO) TIMES DAILY AS NEEDED (NASAL CONGESTION).   Vitamin D (Ergocalciferol) 1.25 MG (50000 UNIT) Caps capsule Commonly known as: DRISDOL Take 1 capsule (50,000 Units total) by mouth every 7 (seven) days.        All past medical history, surgical history, allergies, family history, immunizations andmedications were updated in the EMR today and reviewed under the history and medication portions of their EMR.     ROS Negative, with the exception of above mentioned in HPI   Objective:  BP 116/77   Pulse [name])  108   Temp 99.4 F (37.4 C)   Wt 196 lb (88.9 kg)   SpO2 94%   BMI 33.64 kg/m  Body mass index is 33.64 kg/m. Physical Exam Vitals and nursing note reviewed.  Constitutional:      General: She is not in acute distress.    Appearance: Normal appearance. She is normal weight. She is ill-appearing and diaphoretic. She is not toxic-appearing.  HENT:     Head: Normocephalic and atraumatic.  Eyes:     General: No scleral icterus.       Right eye: No discharge.        Left eye: No discharge.     Extraocular Movements: Extraocular movements intact.     Conjunctiva/sclera: Conjunctivae  normal.     Pupils: Pupils are equal, round, and reactive to light.  Cardiovascular:     Rate and Rhythm: Regular rhythm. Tachycardia present.     Heart sounds: No murmur heard. Pulmonary:     Effort: Pulmonary effort is normal. No respiratory distress.     Breath sounds: Normal breath sounds.  Abdominal:     General: There is distension.     Palpations: Abdomen is soft.     Tenderness: There is abdominal tenderness (Left upper quadrant). There is no right CVA tenderness, left CVA tenderness, guarding or rebound.     Comments: Hyperactive bowel sounds.  No right upper quadrant or right lower quadrant pain.  No epigastric pain.  Musculoskeletal:     Cervical back: Neck supple.  Lymphadenopathy:     Cervical: No cervical adenopathy.  Skin:    Coloration: Skin is pale.     Findings: No rash.  Neurological:     Mental Status: She is alert and oriented to person, place, and time. Mental status is at baseline.     Motor: No weakness.     Coordination: Coordination normal.     Gait: Gait normal.  Psychiatric:        Mood and Affect: Mood normal.        Behavior: Behavior normal.        Thought Content: Thought content normal.        Judgment: Judgment normal.      No results found. No results found. No results found for this or any previous visit (from the past 24 hour(s)).  Assessment/Plan: Kristie Owens is a 67 y.o. female present for OV for  Gastroenteritis, acute/Nausea and vomiting, unspecified vomiting type/diarrhea Patient is pale, tachycardic with low-grade fever.  She appears washed out and fatigued.  She is still in good spirits and does not appear toxic, but certainly appears ill today.  Will start with laboratory workup and treating the symptoms.  She understands if symptoms worsen over the weekend and she is unable to keep yourself hydrated, then she will need to go to the emergency room for further evaluation and IV hydration. - CBC w/Diff - Comp Met (CMET) -  Lipase - ondansetron (ZOFRAN) injection 4 mg Start azithromycin 500 mg daily x 3 days Start Bentyl 3 times daily before meals and nightly Start Lomotil 2 tabs p.o., then repeat 1 tab every 6 hours as needed until diarrhea slows down. Zofran IM provided today and Zofran disintegrating tablet provided for home. Will be called on Monday with lab results and further plan on that time depending upon how she is feeling.  Reviewed expectations re: course of current medical issues. Discussed self-management of symptoms. Outlined signs and symptoms indicating need for more  acute intervention. Patient verbalized understanding and all questions were answered. Patient received an After-Visit Summary.    Orders Placed This Encounter  Procedures   CBC w/Diff   Comp Met (CMET)   Lipase   Meds ordered this encounter  Medications   azithromycin (ZITHROMAX) 500 MG tablet    Sig: Take 1 tablet (500 mg total) by mouth daily for 3 days.    Dispense:  3 tablet    Refill:  0   dicyclomine (BENTYL) 10 MG capsule    Sig: Take 1 capsule (10 mg total) by mouth 4 (four) times daily -  before meals and at bedtime.    Dispense:  90 capsule    Refill:  0   ondansetron (ZOFRAN-ODT) 4 MG disintegrating tablet    Sig: Take 1 tablet (4 mg total) by mouth every 8 (eight) hours as needed for nausea or vomiting.    Dispense:  20 tablet    Refill:  0   DISCONTD: diphenoxylate-atropine (LOMOTIL) 2.5-0.025 MG tablet    Sig: 2 tabs followed by 1 tab in 4-6 hours if needed.    Dispense:  30 tablet    Refill:  0   DISCONTD: ondansetron (ZOFRAN) 4 MG/5ML solution 4 mg   ondansetron (ZOFRAN) injection 4 mg   diphenoxylate-atropine (LOMOTIL) 2.5-0.025 MG tablet    Sig: 2 tabs followed by 1 tab in 4-6 hours if needed.    Dispense:  30 tablet    Refill:  0   Referral Orders  No referral(s) requested today     Note is dictated utilizing voice recognition software. Although note has been proof read prior to  signing, occasional typographical errors still can be missed. If any questions arise, please do not hesitate to call for verification.   electronically signed by:  Felix Pacini, DO  [facility] [place]

## 2022-06-02 LAB — CBC WITH DIFFERENTIAL/PLATELET
Absolute Monocytes: 1281 cells/uL — ABNORMAL HIGH (ref 200–950)
Basophils Absolute: 20 cells/uL (ref 0–200)
Basophils Relative: 0.3 %
Eosinophils Absolute: 280 cells/uL (ref 15–500)
Eosinophils Relative: 4.3 %
HCT: 42.2 % (ref 35.0–45.0)
Hemoglobin: 14 g/dL (ref 11.7–15.5)
Lymphs Abs: 2574 cells/uL (ref 850–3900)
MCH: 25.9 pg — ABNORMAL LOW (ref 27.0–33.0)
MCHC: 33.2 g/dL (ref 32.0–36.0)
MCV: 78 fL — ABNORMAL LOW (ref 80.0–100.0)
MPV: 9 fL (ref 7.5–12.5)
Monocytes Relative: 19.7 %
Neutro Abs: 2347 cells/uL (ref 1500–7800)
Neutrophils Relative %: 36.1 %
Platelets: 433 10*3/uL — ABNORMAL HIGH (ref 140–400)
RBC: 5.41 10*6/uL — ABNORMAL HIGH (ref 3.80–5.10)
RDW: 16 % — ABNORMAL HIGH (ref 11.0–15.0)
Total Lymphocyte: 39.6 %
WBC: 6.5 10*3/uL (ref 3.8–10.8)

## 2022-06-02 LAB — COMPREHENSIVE METABOLIC PANEL
AG Ratio: 1.1 (calc) (ref 1.0–2.5)
ALT: 11 U/L (ref 6–29)
AST: 11 U/L (ref 10–35)
Albumin: 4.2 g/dL (ref 3.6–5.1)
Alkaline phosphatase (APISO): 71 U/L (ref 37–153)
BUN/Creatinine Ratio: 17 (calc) (ref 6–22)
BUN: 22 mg/dL (ref 7–25)
CO2: 18 mmol/L — ABNORMAL LOW (ref 20–32)
Calcium: 9.2 mg/dL (ref 8.6–10.4)
Chloride: 99 mmol/L (ref 98–110)
Creat: 1.26 mg/dL — ABNORMAL HIGH (ref 0.50–1.05)
Globulin: 3.8 g/dL (calc) — ABNORMAL HIGH (ref 1.9–3.7)
Glucose, Bld: 117 mg/dL — ABNORMAL HIGH (ref 65–99)
Potassium: 3.9 mmol/L (ref 3.5–5.3)
Sodium: 129 mmol/L — ABNORMAL LOW (ref 135–146)
Total Bilirubin: 0.3 mg/dL (ref 0.2–1.2)
Total Protein: 8 g/dL (ref 6.1–8.1)

## 2022-06-02 LAB — LIPASE: Lipase: 10 U/L (ref 7–60)

## 2022-06-04 ENCOUNTER — Telehealth: Payer: Self-pay | Admitting: Family Medicine

## 2022-06-04 ENCOUNTER — Telehealth: Payer: Self-pay

## 2022-06-04 NOTE — Telephone Encounter (Signed)
Patient was seen on Friday 5/3; patient has severe diarrhea.  She states she cannot make it to the bathroom in time.  Please advise [phone]

## 2022-06-04 NOTE — Telephone Encounter (Signed)
Pt states she has been taking Lomotil which only helps some, she feels better except for still having diarrhea. Pt also inquiring about labs.

## 2022-06-04 NOTE — Telephone Encounter (Signed)
Increase  Lomotil 2 tabs  every 6 hrs until diarrhea slows down, then go down to one tab every 6 hours There is little else that can be prescribed for diarrhea.

## 2022-06-04 NOTE — Telephone Encounter (Signed)
Please inform patient She had rather significant electrolyte disturbances with a low sodium level and decreased kidney function secondary to her severe dehydration and diarrhea. Her blood cell counts at this time were normal.  I would recommend she follow-up on either Wednesday or Thursday of this week for reevaluation and repeat labs at that time to check electrolytes.  In the meantime drink plenty of electrolyte replacement therapy. Continue the Bentyl and Lomotil as prescribed.

## 2022-06-04 NOTE — Telephone Encounter (Signed)
Pt advised of results, f/u appt is scheduled for Wednesday. She is very concerned about severe diarrhea, Lomotil is not really helping.

## 2022-06-04 NOTE — Telephone Encounter (Signed)
Pt advised of recommendations.  

## 2022-06-06 ENCOUNTER — Ambulatory Visit (INDEPENDENT_AMBULATORY_CARE_PROVIDER_SITE_OTHER): Payer: Managed Care, Other (non HMO) | Admitting: Family Medicine

## 2022-06-06 ENCOUNTER — Encounter: Payer: Self-pay | Admitting: Family Medicine

## 2022-06-06 VITALS — BP 123/67 | HR 53 | Temp 98.0°F | Wt 197.4 lb

## 2022-06-06 DIAGNOSIS — R110 Nausea: Secondary | ICD-10-CM | POA: Diagnosis not present

## 2022-06-06 DIAGNOSIS — E871 Hypo-osmolality and hyponatremia: Secondary | ICD-10-CM

## 2022-06-06 DIAGNOSIS — R197 Diarrhea, unspecified: Secondary | ICD-10-CM

## 2022-06-06 LAB — BASIC METABOLIC PANEL
BUN: 20 mg/dL (ref 6–23)
CO2: 26 mEq/L (ref 19–32)
Calcium: 9.6 mg/dL (ref 8.4–10.5)
Chloride: 102 mEq/L (ref 96–112)
Creatinine, Ser: 0.88 mg/dL (ref 0.40–1.20)
GFR: 67.37 mL/min (ref >60.00)
Glucose, Bld: 88 mg/dL (ref 70–99)
Potassium: 4.2 mEq/L (ref 3.5–5.1)
Sodium: 137 mEq/L (ref 135–145)

## 2022-06-06 NOTE — Patient Instructions (Signed)
No follow-ups on file.        Great to see you today.  I have refilled the medication(s) we provide.   If labs were collected, we will inform you of lab results once received either by echart message or telephone call.   - echart message- for normal results that have been seen by the patient already.   - telephone call: abnormal results or if patient has not viewed results in their echart.  

## 2022-06-06 NOTE — Progress Notes (Signed)
Kristie Owens , 13-Aug-1955, 67 y.o., female MRN: [id number] Patient Care Team    Relationship Specialty Notifications Start End  Natalia Leatherwood, DO PCP - General Family Medicine  06/09/19   Jake Bathe, MD PCP - Cardiology Cardiology  12/17/19   Jake Bathe, MD Consulting Physician Cardiology  06/08/19   Jill Side, OD Referring Physician   06/11/19   Moody Bruins Physician Assistant Chiropractic Medicine  06/11/19   Jill Side, OD Referring Physician   03/13/21     Chief Complaint  Patient presents with   Diarrhea    Also has nausea     Subjective: Kristie Owens is a 67 y.o. Pt presents follow up on acute gastroenteritis/diarrhea.  Patient reports she is feeling some better since seen last week.  She is still having nausea intermittently.  Her appetite was good yesterday, but decreased today.  She is still having diarrhea although not as watery as initially.  Still occurring about once an hour.  She was treated last week with azithromycin, Bentyl, Lomotil, Zofran and she has been working diligently on electrolyte replacement. She was tachycardic at 108 when initially seen, today heart rate is normal in the 50s.  Weight is stable and gained 1 and half pounds since last week. She reports she may have had a little bit of a low-grade fever over the weekend, but has been fever free for 2 days.  Denies blood per rectum or vomiting. Unsure etiology of diarrhea.  Her husband has not become ill and he was caring for her, sharing same bathroom etc.  She had rotisserie chicken (premade store-bought) and raw vegetables prior to onset of symptoms. Prior note: for an OV with complaints of nausea and diarrhea.  Symptoms started on Sunday.  She reports she got better for about a day and then symptoms rebounded rather quickly.  She is able to intake p.o., but she states it goes right through her and she is having 2-3 watery stools an hour.  She has had a low-grade fever, feels  very weak.  She initially thought maybe she had eaten some chicken that was left out too long.  Her husband is with her today he did not eat the chicken.  She is also on Mounjaro at 2.5 mg doses she did not have any difficulty taking the medication and is on her fourth dose.     03/12/2022    8:04 AM 08/28/2021    9:04 AM 03/13/2021   11:15 AM 11/21/2020    9:06 AM 06/06/2020    8:19 AM  Depression screen PHQ 2/9  Decreased Interest 0 0 0 0 0  Down, Depressed, Hopeless 0 0 0 0 1  PHQ - 2 Score 0 0 0 0 1  Altered sleeping 3 2 3 3 3   Tired, decreased energy 1 1 1 1 2   Change in appetite 0 0 2 0 0  Feeling bad or failure about yourself  0 0 0 0 0  Trouble concentrating 0 0 0 0 0  Moving slowly or fidgety/restless 0 0 0 0 0  Suicidal thoughts 0 0 0 0 0  PHQ-9 Score 4 3 6 4 6   Difficult doing work/chores     Not difficult at all    Allergies  Allergen Reactions   Bupropion Nausea Only and Other (See Comments)    Nausea and headaches    Sulfa Antibiotics Other (See Comments)    From  childhood- reaction not recalled   Prednisone Anxiety and Other (See Comments)    "Made me not feel like myself, altered"   Social History   Social History Narrative   Marital status/children/pets: Married.  One child.   Education/employment: Retired.  Associates degree in college.   Safety:      -Wears a bicycle helmet riding a bike: Yes     -smoke alarm in the home:Yes     - wears seatbelt: Yes     - Feels safe in their relationships: Yes   Past Medical History:  Diagnosis Date   Achilles bursitis or tendinitis 11/11/2012   right    Allergy    Angina at rest    Dr Anne Fu   CAD (coronary artery disease), native coronary artery    DES to LAD in 2 areas, one at the bifurcation of a diagonal branch and also in the mid to distal region. Her diagonal branch was also ballooned . She has a DES to RCA 10/10. Her circumflex artery mild ostial disease.   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   Tremor 03/23/2020   Past Surgical History:  Procedure Laterality Date   BREAST EXCISIONAL BIOPSY Right 1995   CAROTID STENT     CESAREAN SECTION  1983   FOOT SURGERY     LEFT HEART CATH AND CORONARY ANGIOGRAPHY N/A 06/04/2016   Procedure: Left Heart Cath and Coronary Angiography;  Surgeon: Lyn Records, MD;  Location: [facility] INVASIVE CV LAB;  Service: Cardiovascular;  Laterality: N/A;   LEFT HEART CATH AND CORONARY ANGIOGRAPHY N/A 12/04/2019   Procedure: LEFT HEART CATH AND CORONARY ANGIOGRAPHY;  Surgeon: Kathleene Hazel, MD;  Location: [facility] INVASIVE CV LAB;  Service: Cardiovascular;  Laterality: N/A;   LEFT HEART CATHETERIZATION WITH CORONARY ANGIOGRAM N/A 01/16/2011   Procedure: LEFT HEART CATHETERIZATION WITH CORONARY ANGIOGRAM;  Surgeon: Donato Schultz, MD;  Location: [facility] CATH LAB;  Service: Cardiovascular;  Laterality: N/A;   NOSE SURGERY     SINOSCOPY     Family History  Problem Relation Age of Onset   Hyperlipidemia Brother    Hypertension Brother    Intellectual disability Brother    Kidney disease Brother    Hypertension Mother    Heart disease Mother    COPD Mother    Ulcers Mother    Lung cancer Father    Heart failure Paternal Grandmother    Heart attack Paternal Grandmother    Hypertension Paternal Grandmother    Hyperlipidemia Paternal Grandmother    Heart disease Paternal Grandmother    Heart attack Brother    Asthma Brother    Early death Brother    Hyperlipidemia Brother    Hypertension Brother     Intellectual disability Brother    Kidney disease Brother    Heart disease Brother    Hypertension Sister    Diabetes Sister    Early  death Maternal Grandmother    Tuberculosis Maternal Grandmother    Lung cancer Maternal Grandfather    Suicidality Maternal Grandfather    Early death Maternal Grandfather    Heart disease Paternal Grandfather    Hyperlipidemia Paternal Grandfather    Hypertension Paternal Grandfather    Heart attack Other    Arthritis Sister    Depression Sister    Miscarriages / Stillbirths Sister    Alcohol abuse Brother    Stroke Neg Hx    Allergies as of 06/06/2022       Reactions   Bupropion Nausea Only, Other (See Comments)   Nausea and headaches    Sulfa Antibiotics Other (See Comments)   From childhood- reaction not recalled   Prednisone Anxiety, Other (See Comments)   "Made me not feel like myself, altered"        Medication List        Accurate as of Jun 06, 2022  8:31 AM. If you have any questions, ask your nurse or doctor.          amLODipine 5 MG tablet Commonly known as: NORVASC Take 1 tablet (5 mg total) by mouth daily.   aspirin EC 81 MG tablet Take 81 mg by mouth daily.   budesonide 0.5 MG/2ML nebulizer solution Commonly known as: Pulmicort Use twice a day in a sinus rinse bottle.   carvedilol 6.25 MG tablet Commonly known as: COREG Take 1 tablet (6.25 mg total) by mouth 2 (two) times daily with a meal.   cetirizine 10 MG tablet Commonly known as: ZyrTEC Allergy Take 1 tablet (10 mg total) by mouth daily.   cromolyn 4 % ophthalmic solution Commonly known as: OPTICROM Place 1 drop into both eyes 4 (four) times daily as needed (itchy/watery eyes).   dicyclomine 10 MG capsule Commonly known as: BENTYL Take 1 capsule (10 mg total) by mouth 4 (four) times daily -  before meals and at bedtime.   diphenoxylate-atropine 2.5-0.025 MG tablet Commonly known as: Lomotil 2 tabs followed by 1 tab in 4-6 hours if needed.    isosorbide mononitrate 60 MG 24 hr tablet Commonly known as: IMDUR Take 1 tablet (60 mg total) by mouth daily.   lisinopril-hydrochlorothiazide 20-12.5 MG tablet Commonly known as: ZESTORETIC Take 1 tablet by mouth daily.   meloxicam 15 MG tablet Commonly known as: MOBIC Take 1 tablet (15 mg total) by mouth daily.   metFORMIN 500 MG tablet Commonly known as: GLUCOPHAGE TAKE 1 TABLET BY MOUTH EVERY DAY WITH BREAKFAST   montelukast 10 MG tablet Commonly known as: SINGULAIR TAKE 1 TABLET BY MOUTH EVERYDAY AT BEDTIME   nitroGLYCERIN 0.4 MG SL tablet Commonly known as: NITROSTAT Place 1 tablet (0.4 mg total) under the tongue every 5 (five) minutes as needed for chest pain.   omeprazole 20 MG tablet Commonly known as: PRILOSEC OTC Take 20 mg by mouth daily as needed (acid reflux).   ondansetron 4 MG disintegrating tablet Commonly known as: ZOFRAN-ODT Take 1 tablet (4 mg total) by mouth every 8 (eight) hours as needed for nausea or vomiting.   rosuvastatin 20 MG tablet Commonly known as: CRESTOR Take 1 tablet (20 mg total) by mouth daily.   sertraline 100 MG tablet Commonly known as: ZOLOFT Take 1 tablet (100 mg total) by mouth at bedtime.   tirzepatide 2.5 MG/0.5ML Pen Commonly known as: MOUNJARO Inject 2.5 mg into the skin once a week.   tirzepatide 5 MG/0.5ML Pen Commonly known as: MOUNJARO Inject 5 mg  into the skin once a week.   triamcinolone 55 MCG/ACT Aero nasal inhaler Commonly known as: NASACORT PLACE 1 SPRAY INTO THE NOSE 2 (TWO) TIMES DAILY AS NEEDED (NASAL CONGESTION).   Vitamin D (Ergocalciferol) 1.25 MG (50000 UNIT) Caps capsule Commonly known as: DRISDOL Take 1 capsule (50,000 Units total) by mouth every 7 (seven) days.        All past medical history, surgical history, allergies, family history, immunizations andmedications were updated in the EMR today and reviewed under the history and medication portions of their EMR.     ROS Negative, with  the exception of above mentioned in HPI   Objective:  BP 123/67   Pulse (!) 53   Temp 98 F (36.7 C)   Wt 197 lb 6.4 oz (89.5 kg)   SpO2 97%   BMI 33.88 kg/m  Body mass index is 33.88 kg/m. Physical Exam Vitals and nursing note reviewed.  Constitutional:      General: She is not in acute distress.    Appearance: Normal appearance. She is normal weight. She is not ill-appearing, toxic-appearing or diaphoretic.  HENT:     Head: Normocephalic and atraumatic.  Eyes:     General: No scleral icterus.       Right eye: No discharge.        Left eye: No discharge.     Extraocular Movements: Extraocular movements intact.     Conjunctiva/sclera: Conjunctivae normal.     Pupils: Pupils are equal, round, and reactive to light.  Cardiovascular:     Rate and Rhythm: Regular rhythm. Tachycardia present.     Heart sounds: No murmur heard. Pulmonary:     Effort: Pulmonary effort is normal. No respiratory distress.     Breath sounds: Normal breath sounds.  Abdominal:     General: There is no distension.     Palpations: Abdomen is soft.     Tenderness: There is no abdominal tenderness (Left upper quadrant). There is no right CVA tenderness, left CVA tenderness, guarding or rebound.     Comments: Hyperactive bowel sounds.  No right upper quadrant or right lower quadrant pain.  No epigastric pain.  Musculoskeletal:     Cervical back: Neck supple.  Lymphadenopathy:     Cervical: No cervical adenopathy.  Skin:    Coloration: Skin is not pale.     Findings: No rash.  Neurological:     Mental Status: She is alert and oriented to person, place, and time. Mental status is at baseline.     Motor: No weakness.     Coordination: Coordination normal.     Gait: Gait normal.  Psychiatric:        Mood and Affect: Mood normal.        Behavior: Behavior normal.        Thought Content: Thought content normal.        Judgment: Judgment normal.      No results found. No results found. No  results found for this or any previous visit (from the past 24 hour(s)).  Assessment/Plan: Kristie Owens is a 67 y.o. female present for OV for  Nausea-diarrhea/hyponatremia The majority of her symptoms have all improved and resolved. She is tolerating p.o. with some nausea remaining.  She did have hyponatremia of 129 on her labs last week.  She has been pushing the electrolytes. Recheck BMP today. Diarrhea is still present, although not as frequent.  Still averaging about once an hour, therefore stool studies, hepatitis A and  C. difficile ordered today.  Her white count was normal last week. Continue Bentyl and Lomotil. Continue hydration with electrolyte replacement Patient will be called with results and further treatment plan dependent upon those results.   Reviewed expectations re: course of current medical issues. Discussed self-management of symptoms. Outlined signs and symptoms indicating need for more acute intervention. Patient verbalized understanding and all questions were answered. Patient received an After-Visit Summary.    No orders of the defined types were placed in this encounter.  No orders of the defined types were placed in this encounter.  Referral Orders  No referral(s) requested today     Note is dictated utilizing voice recognition software. Although note has been proof read prior to signing, occasional typographical errors still can be missed. If any questions arise, please do not hesitate to call for verification.   electronically signed by:  Felix Pacini, DO  [facility] [place]

## 2022-06-07 LAB — URINALYSIS W MICROSCOPIC + REFLEX CULTURE
Bacteria, UA: NONE SEEN /HPF
Bilirubin Urine: NEGATIVE
Glucose, UA: NEGATIVE
Hyaline Cast: NONE SEEN /LPF
Ketones, ur: NEGATIVE
Leukocyte Esterase: NEGATIVE
Nitrites, Initial: NEGATIVE
Protein, ur: NEGATIVE
RBC / HPF: NONE SEEN /HPF (ref 0–2)
Specific Gravity, Urine: 1.011 (ref 1.001–1.035)
WBC, UA: NONE SEEN /HPF (ref 0–5)
pH: 5.5 (ref 5.0–8.0)

## 2022-06-07 LAB — NO CULTURE INDICATED

## 2022-06-07 LAB — HEPATITIS A ANTIBODY, IGM: Hep A IgM: NONREACTIVE

## 2022-06-08 LAB — OVA AND PARASITE EXAMINATION
CONCENTRATE RESULT:: NONE SEEN
MICRO NUMBER:: 14930254
SPECIMEN QUALITY:: ADEQUATE
TRICHROME RESULT:: NONE SEEN

## 2022-06-08 LAB — CLOSTRIDIUM DIFFICILE BY PCR: Toxigenic C. Difficile by PCR: NEGATIVE

## 2022-06-10 LAB — STOOL CULTURE

## 2022-06-12 LAB — STOOL CULTURE: E coli, Shiga toxin Assay: NEGATIVE

## 2022-07-02 ENCOUNTER — Ambulatory Visit
Admission: RE | Admit: 2022-07-02 | Discharge: 2022-07-02 | Disposition: A | Discharge status: Home / Self Care | Payer: Managed Care, Other (non HMO) | Source: Ambulatory Visit | Attending: Family Medicine | Admitting: Family Medicine

## 2022-07-02 DIAGNOSIS — Z1231 Encounter for screening mammogram for malignant neoplasm of breast: Secondary | ICD-10-CM

## 2022-07-09 ENCOUNTER — Other Ambulatory Visit: Payer: Self-pay | Admitting: Family Medicine

## 2022-08-03 ENCOUNTER — Other Ambulatory Visit: Payer: Self-pay

## 2022-08-03 NOTE — Telephone Encounter (Signed)
Pharmacy sent request to refill Montelukast, last refilled 03/12/22 (90,1)  Refill too early

## 2022-08-08 ENCOUNTER — Ambulatory Visit: Payer: Managed Care, Other (non HMO) | Admitting: Family Medicine

## 2022-08-08 ENCOUNTER — Encounter: Payer: Self-pay | Admitting: Family Medicine

## 2022-08-08 VITALS — BP 133/76 | HR 69 | Temp 98.2°F | Wt 191.2 lb

## 2022-08-08 DIAGNOSIS — R6881 Early satiety: Secondary | ICD-10-CM

## 2022-08-08 DIAGNOSIS — R1012 Left upper quadrant pain: Secondary | ICD-10-CM | POA: Diagnosis not present

## 2022-08-08 DIAGNOSIS — R197 Diarrhea, unspecified: Secondary | ICD-10-CM | POA: Diagnosis not present

## 2022-08-08 DIAGNOSIS — R1010 Upper abdominal pain, unspecified: Secondary | ICD-10-CM

## 2022-08-08 LAB — COMPREHENSIVE METABOLIC PANEL
ALT: 9 U/L (ref 0–35)
AST: 11 U/L (ref 0–37)
Albumin: 4.1 g/dL (ref 3.5–5.2)
Alkaline Phosphatase: 68 U/L (ref 39–117)
BUN: 22 mg/dL (ref 6–23)
CO2: 24 mEq/L (ref 19–32)
Calcium: 9.7 mg/dL (ref 8.4–10.5)
Chloride: 103 mEq/L (ref 96–112)
Creatinine, Ser: 0.94 mg/dL (ref 0.40–1.20)
GFR: 62.17 mL/min (ref >60.00)
Glucose, Bld: 98 mg/dL (ref 70–99)
Potassium: 4.2 mEq/L (ref 3.5–5.1)
Sodium: 135 mEq/L (ref 135–145)
Total Bilirubin: 0.3 mg/dL (ref 0.2–1.2)
Total Protein: 7.5 g/dL (ref 6.0–8.3)

## 2022-08-08 LAB — CBC WITH DIFFERENTIAL/PLATELET
Basophils Absolute: 0 10*3/uL (ref 0.0–0.1)
Basophils Relative: 0.4 % (ref 0.0–3.0)
Eosinophils Absolute: 0.4 10*3/uL (ref 0.0–0.7)
Eosinophils Relative: 6.7 % — ABNORMAL HIGH (ref 0.0–5.0)
HCT: 37.9 % (ref 36.0–46.0)
Hemoglobin: 12.2 g/dL (ref 12.0–15.0)
Lymphocytes Relative: 40.9 % (ref 12.0–46.0)
Lymphs Abs: 2.2 10*3/uL (ref 0.7–4.0)
MCHC: 32.2 g/dL (ref 30.0–36.0)
MCV: 82.7 fl (ref 78.0–100.0)
Monocytes Absolute: 0.8 10*3/uL (ref 0.1–1.0)
Monocytes Relative: 15.8 % — ABNORMAL HIGH (ref 3.0–12.0)
Neutro Abs: 1.9 10*3/uL (ref 1.4–7.7)
Neutrophils Relative %: 36.2 % — ABNORMAL LOW (ref 43.0–77.0)
Platelets: 405 10*3/uL — ABNORMAL HIGH (ref 150.0–400.0)
RBC: 4.58 Mil/uL (ref 3.87–5.11)
RDW: 16.6 % — ABNORMAL HIGH (ref 11.5–15.5)
WBC: 5.3 10*3/uL (ref 4.0–10.5)

## 2022-08-08 LAB — C-REACTIVE PROTEIN: CRP: 1.8 mg/dL (ref 0.5–20.0)

## 2022-08-08 LAB — LIPASE: Lipase: 21 U/L (ref 11.0–59.0)

## 2022-08-08 LAB — SEDIMENTATION RATE: Sed Rate: 41 mm/hr — ABNORMAL HIGH (ref 0–30)

## 2022-08-08 MED ORDER — DICYCLOMINE HCL 10 MG PO CAPS: 10.0000 mg | ORAL_CAPSULE | Freq: Three times a day (TID) | ORAL | 1 refills | Status: DC

## 2022-08-08 NOTE — Patient Instructions (Addendum)
Return if symptoms worsen or fail to improve. Also, Avoid all dairy for now         [place] to see you today.  I have refilled the medication(s) we provide.   If labs were collected, we will inform you of lab results once received either by echart message or telephone call.   - echart message- for normal results that have been seen by the patient already.   - telephone call: abnormal results or if patient has not viewed results in their echart.

## 2022-08-08 NOTE — Progress Notes (Signed)
Kristie Owens , 27-Mar-1955, 67 y.o., female MRN: [id number] Patient Care Team    Relationship Specialty Notifications Start End  Kristie Leatherwood, DO PCP - General Family Medicine  06/09/19   Kristie Bathe, MD PCP - Cardiology Cardiology  12/17/19   Kristie Bathe, MD Consulting Physician Cardiology  06/08/19   Kristie Owens, OD Referring Physician   06/11/19   Kristie Owens Physician Assistant Chiropractic Medicine  06/11/19   Kristie Owens, OD Referring Physician   03/13/21     Chief Complaint  Patient presents with   Diarrhea    Almost 1 week     Subjective: Kristie Owens is a 67 y.o. Pt presents for recurrent diarrhea.  Patient had a similar episode in May.  She is having 2-3 stools an hour over the last week.  She states the stools are [place], but they consist of water.  She states diarrhea will hit her about 15 to 20 minutes after she eats as well.  She denies any fevers or chills.  Last occurrence created significant electrolyte disturbances.  Patient is prescribed Mounjaro, which has been consistent with no recent increase in dose.  She has her gallbladder.  She does mild nausea without vomiting.  Early satiety is present. - 05/2022- neg -O&P, c.diff, cbc wnl, hepatitis panel normal - colonoscopy 2018- [facility]- Dr. Donita Owens. (No report available) FH: mother "Ulcers"  Prior note: follow up on acute gastroenteritis/diarrhea.  Patient reports she is feeling some better since seen last week.  She is still having nausea intermittently.  Her appetite was good yesterday, but decreased today.  She is still having diarrhea although not as watery as initially.  Still occurring about once an hour.  She was treated last week with azithromycin, Bentyl, Lomotil, Zofran and she has been working diligently on electrolyte replacement. She was tachycardic at 108 when initially seen, today heart rate is normal in the 50s.  Weight is stable and gained 1 and half pounds since last week. She  reports she may have had a little bit of a low-grade fever over the weekend, but has been fever free for 2 days.  Denies blood per rectum or vomiting. Unsure etiology of diarrhea.  Her husband has not become ill and he was caring for her, sharing same bathroom etc.  She had rotisserie chicken (premade store-bought) and raw vegetables prior to onset of symptoms. Prior note: for an OV with complaints of nausea and diarrhea.  Symptoms started on Sunday.  She reports she got better for about a day and then symptoms rebounded rather quickly.  She is able to intake p.o., but she states it goes right through her and she is having 2-3 watery stools an hour.  She has had a low-grade fever, feels very weak.  She initially thought maybe she had eaten some chicken that was left out too long.  Her husband is with her today he did not eat the chicken.  She is also on Mounjaro at 2.5 mg doses she did not have any difficulty taking the medication and is on her fourth dose.     08/08/2022   11:23 AM 03/12/2022    8:04 AM 08/28/2021    9:04 AM 03/13/2021   11:15 AM 11/21/2020    9:06 AM  Depression screen PHQ 2/9  Decreased Interest 0 0 0 0 0  Down, Depressed, Hopeless 0 0 0 0 0  PHQ - 2 Score 0  0 0 0 0  Altered sleeping  3 2 3 3   Tired, decreased energy  1 1 1 1   Change in appetite  0 0 2 0  Feeling bad or failure about yourself   0 0 0 0  Trouble concentrating  0 0 0 0  Moving slowly or fidgety/restless  0 0 0 0  Suicidal thoughts  0 0 0 0  PHQ-9 Score  4 3 6 4     Allergies  Allergen Reactions   Bupropion Nausea Only and Other (See Comments)    Nausea and headaches    Sulfa Antibiotics Other (See Comments)    From childhood- reaction not recalled   Prednisone Anxiety and Other (See Comments)    "Made me not feel like myself, altered"   Social History   Social History Narrative   Marital status/children/pets: Married.  One child.   Education/employment: Retired.  Associates degree in college.    Safety:      -Wears a bicycle helmet riding a bike: Yes     -smoke alarm in the home:Yes     - wears seatbelt: Yes     - Feels safe in their relationships: Yes   Past Medical History:  Diagnosis Date   Achilles bursitis or tendinitis 11/11/2012   right    Allergy    Angina at rest    Dr Anne Fu   CAD (coronary artery disease), native coronary artery    DES to LAD in 2 areas, one at the bifurcation of a diagonal branch and also in the mid to distal region. Her diagonal branch was also ballooned . She has a DES to RCA 10/10. Her circumflex artery mild ostial disease.   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   Tremor 03/23/2020   Past Surgical History:  Procedure Laterality Date   BREAST EXCISIONAL BIOPSY Right 1995   CAROTID STENT     CESAREAN SECTION  1983   FOOT SURGERY     LEFT HEART CATH AND CORONARY ANGIOGRAPHY N/A 06/04/2016   Procedure: Left Heart Cath and Coronary Angiography;  Surgeon: Lyn Records, MD;  Location: [facility] INVASIVE CV LAB;  Service: Cardiovascular;  Laterality: N/A;   LEFT HEART CATH AND CORONARY ANGIOGRAPHY N/A 12/04/2019   Procedure: LEFT HEART CATH AND CORONARY ANGIOGRAPHY;  Surgeon: Kathleene Hazel, MD;  Location: [facility] INVASIVE CV LAB;  Service: Cardiovascular;  Laterality: N/A;   LEFT HEART CATHETERIZATION WITH CORONARY ANGIOGRAM N/A 01/16/2011   Procedure: LEFT HEART CATHETERIZATION WITH CORONARY ANGIOGRAM;  Surgeon: Donato Schultz, MD;  Location: [facility] CATH LAB;  Service: Cardiovascular;   Laterality: N/A;   NOSE SURGERY     SINOSCOPY     Family History  Problem Relation Age of Onset   Hypertension Mother    Heart disease Mother    COPD Mother    Ulcers Mother    Lung cancer Father  Hypertension Sister    Diabetes Sister    Arthritis Sister    Depression Sister    Miscarriages / Stillbirths Sister    Early death Maternal Grandmother    Tuberculosis Maternal Grandmother    Lung cancer Maternal Grandfather    Suicidality Maternal Grandfather    Early death Maternal Grandfather    Heart failure Paternal Grandmother    Heart attack Paternal Grandmother    Hypertension Paternal Grandmother    Hyperlipidemia Paternal Grandmother    Heart disease Paternal Grandmother    Heart disease Paternal Grandfather    Hyperlipidemia Paternal Grandfather    Hypertension Paternal Grandfather    Hyperlipidemia Brother    Hypertension Brother    Intellectual disability Brother    Kidney disease Brother    Heart attack Brother    Asthma Brother    Early death Brother    Hyperlipidemia Brother    Hypertension Brother    Intellectual disability Brother    Kidney disease Brother    Heart disease Brother    Alcohol abuse Brother    Heart attack Other    Stroke Neg Hx    Breast cancer Neg Hx    Allergies as of 08/08/2022       Reactions   Bupropion Nausea Only, Other (See Comments)   Nausea and headaches    Sulfa Antibiotics Other (See Comments)   From childhood- reaction not recalled   Prednisone Anxiety, Other (See Comments)   "Made me not feel like myself, altered"        Medication List        Accurate as of August 08, 2022  4:25 PM. If you have any questions, ask your nurse or doctor.          amLODipine 5 MG tablet Commonly known as: NORVASC Take 1 tablet (5 mg total) by mouth daily.   aspirin EC 81 MG tablet Take 81 mg by mouth daily.   budesonide 0.5 MG/2ML nebulizer solution Commonly known as: Pulmicort Use twice a day in a sinus rinse  bottle.   carvedilol 6.25 MG tablet Commonly known as: COREG Take 1 tablet (6.25 mg total) by mouth 2 (two) times daily with a meal.   cetirizine 10 MG tablet Commonly known as: ZyrTEC Allergy Take 1 tablet (10 mg total) by mouth daily.   cromolyn 4 % ophthalmic solution Commonly known as: OPTICROM Place 1 drop into both eyes 4 (four) times daily as needed (itchy/watery eyes).   dicyclomine 10 MG capsule Commonly known as: BENTYL Take 1 capsule (10 mg total) by mouth 4 (four) times daily -  before meals and at bedtime.   diphenoxylate-atropine 2.5-0.025 MG tablet Commonly known as: Lomotil 2 tabs followed by 1 tab in 4-6 hours if needed.   isosorbide mononitrate 60 MG 24 hr tablet Commonly known as: IMDUR Take 1 tablet (60 mg total) by mouth daily.   lisinopril-hydrochlorothiazide 20-12.5 MG tablet Commonly known as: ZESTORETIC Take 1 tablet by mouth daily.   meloxicam 15 MG tablet Commonly known as: MOBIC Take 1 tablet (15 mg total) by mouth daily.   metFORMIN 500 MG tablet Commonly known as: GLUCOPHAGE TAKE 1 TABLET BY MOUTH EVERY DAY WITH BREAKFAST   montelukast 10 MG tablet Commonly known as: SINGULAIR TAKE 1 TABLET BY MOUTH EVERYDAY AT BEDTIME   nitroGLYCERIN 0.4 MG SL tablet Commonly known as: NITROSTAT Place 1 tablet (0.4 mg total) under the tongue every 5 (five) minutes as needed for chest pain.   omeprazole  20 MG tablet Commonly known as: PRILOSEC OTC Take 20 mg by mouth daily as needed (acid reflux).   ondansetron 4 MG disintegrating tablet Commonly known as: ZOFRAN-ODT Take 1 tablet (4 mg total) by mouth every 8 (eight) hours as needed for nausea or vomiting.   rosuvastatin 20 MG tablet Commonly known as: CRESTOR TAKE 1 TABLET BY MOUTH EVERY DAY   sertraline 100 MG tablet Commonly known as: ZOLOFT Take 1 tablet (100 mg total) by mouth at bedtime.   tirzepatide 5 MG/0.5ML Pen Commonly known as: MOUNJARO Inject 5 mg into the skin once a  week. What changed: Another medication with the same name was removed. Continue taking this medication, and follow the directions you see here. Changed by: Felix Pacini, DO   triamcinolone 55 MCG/ACT Aero nasal inhaler Commonly known as: NASACORT PLACE 1 SPRAY INTO THE NOSE 2 (TWO) TIMES DAILY AS NEEDED (NASAL CONGESTION).   Vitamin D (Ergocalciferol) 1.25 MG (50000 UNIT) Caps capsule Commonly known as: DRISDOL Take 1 capsule (50,000 Units total) by mouth every 7 (seven) days.        All past medical history, surgical history, allergies, family history, immunizations andmedications were updated in the EMR today and reviewed under the history and medication portions of their EMR.     ROS Negative, with the exception of above mentioned in HPI   Objective:  BP 133/76   Pulse 69   Temp 98.2 F (36.8 C)   Wt 191 lb 3.2 oz (86.7 kg)   SpO2 98%   BMI 32.82 kg/m  Body mass index is 32.82 kg/m. Physical Exam Vitals and nursing note reviewed.  Constitutional:      General: She is not in acute distress.    Appearance: Normal appearance. She is normal weight. She is not ill-appearing, toxic-appearing or diaphoretic.  HENT:     Head: Normocephalic and atraumatic.  Eyes:     General: No scleral icterus.       Right eye: No discharge.        Left eye: No discharge.     Extraocular Movements: Extraocular movements intact.     Conjunctiva/sclera: Conjunctivae normal.     Pupils: Pupils are equal, round, and reactive to light.  Cardiovascular:     Rate and Rhythm: Normal rate and regular rhythm.     Heart sounds: No murmur heard. Pulmonary:     Effort: Pulmonary effort is normal. No respiratory distress.     Breath sounds: Normal breath sounds.  Abdominal:     General: Bowel sounds are normal. There is no distension.     Palpations: Abdomen is soft.     Tenderness: There is abdominal tenderness in the right upper quadrant and left upper quadrant. There is no right CVA  tenderness, left CVA tenderness, guarding or rebound. Positive signs include Murphy's sign.  Musculoskeletal:     Cervical back: Neck supple.  Lymphadenopathy:     Cervical: No cervical adenopathy.  Skin:    Coloration: Skin is not pale.     Findings: No rash.  Neurological:     Mental Status: She is alert and oriented to person, place, and time. Mental status is at baseline.     Motor: No weakness.     Coordination: Coordination normal.     Gait: Gait normal.  Psychiatric:        Mood and Affect: Mood normal.        Behavior: Behavior normal.        Thought Content:  Thought content normal.        Judgment: Judgment normal.      No results found. No results found. Results for orders placed or performed in visit on 08/08/22 (from the past 24 hour(s))  Sedimentation rate     Status: Abnormal   Collection Time: 08/08/22 12:02 PM  Result Value Ref Range   Sed Rate 41 (H) 0 - 30 mm/hr  C-reactive protein     Status: None   Collection Time: 08/08/22 12:02 PM  Result Value Ref Range   CRP 1.8 0.5 - 20.0 mg/dL  CBC w/Diff     Status: Abnormal   Collection Time: 08/08/22 12:02 PM  Result Value Ref Range   WBC 5.3 4.0 - 10.5 K/uL   RBC 4.58 3.87 - 5.11 Mil/uL   Hemoglobin 12.2 12.0 - 15.0 g/dL   HCT 09.8 11.9 - 14.7 %   MCV 82.7 78.0 - 100.0 fl   MCHC 32.2 30.0 - 36.0 g/dL   RDW 82.9 (H) 56.2 - 13.0 %   Platelets 405.0 (H) 150.0 - 400.0 K/uL   Neutrophils Relative % 36.2 (L) 43.0 - 77.0 %   Lymphocytes Relative 40.9 12.0 - 46.0 %   Monocytes Relative 15.8 (H) 3.0 - 12.0 %   Eosinophils Relative 6.7 (H) 0.0 - 5.0 %   Basophils Relative 0.4 0.0 - 3.0 %   Neutro Abs 1.9 1.4 - 7.7 K/uL   Lymphs Abs 2.2 0.7 - 4.0 K/uL   Monocytes Absolute 0.8 0.1 - 1.0 K/uL   Eosinophils Absolute 0.4 0.0 - 0.7 K/uL   Basophils Absolute 0.0 0.0 - 0.1 K/uL  Comp Met (CMET)     Status: None   Collection Time: 08/08/22 12:02 PM  Result Value Ref Range   Sodium 135 135 - 145 mEq/L   Potassium  4.2 3.5 - 5.1 mEq/L   Chloride 103 96 - 112 mEq/L   CO2 24 19 - 32 mEq/L   Glucose, Bld 98 70 - 99 mg/dL   BUN 22 6 - 23 mg/dL   Creatinine, Ser 8.65 0.40 - 1.20 mg/dL   Total Bilirubin 0.3 0.2 - 1.2 mg/dL   Alkaline Phosphatase 68 39 - 117 U/L   AST 11 0 - 37 U/L   ALT 9 0 - 35 U/L   Total Protein 7.5 6.0 - 8.3 g/dL   Albumin 4.1 3.5 - 5.2 g/dL   GFR 78.46 >96.29 mL/min   Calcium 9.7 8.4 - 10.5 mg/dL  Lipase     Status: None   Collection Time: 08/08/22 12:02 PM  Result Value Ref Range   Lipase 21.0 11.0 - 59.0 U/L    Assessment/Plan: JUANELL SAFFO is a 67 y.o. female present for OV for  Nausea-diarrhea Reoccurrence of diarrhea 2-3 times an hour.  Concern for IBD, pancreatitis, colitis versus gallbladder etiology. Cannot rule out infectious cause of recurrent diarrhea. Stool studies, fecal WBC and fecal fat collected today. Repeat C. Difficile CBC, CMP, ESR, CRP, lipase collected today Restart Bentyl Continue hydration with electrolyte replacement Patient will be called with results and further treatment plan dependent upon those results. CT abdomen/pelvis with contrast ordered today due to upper abdominal pain on exam, recurrent uncontrolled diarrhea. Patient will be called with results and further plan discussed at that time.  Reviewed expectations re: course of current medical issues. Discussed self-management of symptoms. Outlined signs and symptoms indicating need for more acute intervention. Patient verbalized understanding and all questions were answered. Patient received an After-Visit  Summary.    Orders Placed This Encounter  Procedures   Stool Culture   Clostridium difficile Toxin B, Qualitative, Real-Time PCR   Fecal Fat, Qualitative   CT Abdomen Pelvis W Contrast   Sedimentation rate   C-reactive protein   CBC w/Diff   Comp Met (CMET)   Stool, WBC/Lactoferrin   Lipase   Meds ordered this encounter  Medications   dicyclomine (BENTYL) 10 MG capsule     Sig: Take 1 capsule (10 mg total) by mouth 4 (four) times daily -  before meals and at bedtime.    Dispense:  90 capsule    Refill:  1   Referral Orders  No referral(s) requested today     Note is dictated utilizing voice recognition software. Although note has been proof read prior to signing, occasional typographical errors still can be missed. If any questions arise, please do not hesitate to call for verification.   electronically signed by:  Felix Pacini, DO  [facility] [place]

## 2022-08-10 LAB — CLOSTRIDIUM DIFFICILE TOXIN B, QUALITATIVE, REAL-TIME PCR: Toxigenic C. Difficile by PCR: NOT DETECTED

## 2022-08-10 LAB — FECAL LACTOFERRIN, QUANT: SPECIMEN QUALITY:: ADEQUATE

## 2022-08-11 LAB — FECAL FAT, QUALITATIVE: FECAL FAT, QUALITATIVE: ABNORMAL — AB

## 2022-08-11 LAB — FECAL LACTOFERRIN, QUANT
Fecal Lactoferrin: NEGATIVE
MICRO NUMBER:: 15188849

## 2022-08-13 ENCOUNTER — Telehealth: Payer: Self-pay | Admitting: Family Medicine

## 2022-08-13 LAB — STOOL CULTURE: E coli, Shiga toxin Assay: NEGATIVE

## 2022-08-13 MED ORDER — CHOLESTYRAMINE 4 G PO PACK: 4.0000 g | PACK | Freq: Three times a day (TID) | ORAL | 2 refills | Status: DC

## 2022-08-13 NOTE — Telephone Encounter (Signed)
Please inform patient: Stool studies are negative for C. difficile, E. coli, Shigella, Salmonella fecal WBC. There is still 1 lab to evaluate infectious etiology pending Stool was positive for fecal fat, which can mean pancreatic cause versus other malabsorption type causes.   -I have called in a medication called cholestyramine which is a packet that is mixed in fluid consumed prior to meals.  Once we have abdominal CT results, we will be able to discuss further plan which most likely at this point will be a referral to gastroenterology.

## 2022-08-13 NOTE — Telephone Encounter (Signed)
noted 

## 2022-08-13 NOTE — Telephone Encounter (Signed)
error 

## 2022-08-13 NOTE — Telephone Encounter (Signed)
Spoke with patient regarding results/recommendations.  

## 2022-08-14 ENCOUNTER — Telehealth: Payer: Self-pay | Admitting: Family Medicine

## 2022-08-14 DIAGNOSIS — R1010 Upper abdominal pain, unspecified: Secondary | ICD-10-CM

## 2022-08-14 DIAGNOSIS — R6881 Early satiety: Secondary | ICD-10-CM

## 2022-08-14 DIAGNOSIS — R197 Diarrhea, unspecified: Secondary | ICD-10-CM

## 2022-08-14 NOTE — Telephone Encounter (Signed)
Please inform patient the last of her stool study is negative. I had ordered a CT abdomen on the date of her appointment a few weeks ago.  I do not see this has been scheduled yet, please check on this as we need it completed to guide her further management.  I would like to go ahead and get her referral to gastroenterology initiated.  She had reported her prior gastroenterologist was at [facility], we do not have the records for this.  Please ask her who her gastroenterologist was so we can get her referred back to them.

## 2022-08-14 NOTE — Telephone Encounter (Signed)
Pt does not remember GI but knows she does not want to go back to that location. GI referral pending

## 2022-08-14 NOTE — Addendum Note (Signed)
Addended by: Felix Pacini A on: 08/14/2022 02:18 PM   Modules accepted: Orders

## 2022-08-14 NOTE — Addendum Note (Signed)
Addended by: Filomena Jungling on: 08/14/2022 01:58 PM   Modules accepted: Orders

## 2022-08-15 ENCOUNTER — Telehealth: Payer: Self-pay | Admitting: Family Medicine

## 2022-08-15 ENCOUNTER — Encounter: Payer: Self-pay | Admitting: Family Medicine

## 2022-08-15 ENCOUNTER — Ambulatory Visit
Admission: RE | Admit: 2022-08-15 | Discharge: 2022-08-15 | Disposition: A | Discharge status: Home / Self Care | Payer: Managed Care, Other (non HMO) | Source: Ambulatory Visit | Attending: Family Medicine | Admitting: Family Medicine

## 2022-08-15 ENCOUNTER — Telehealth: Payer: Self-pay

## 2022-08-15 ENCOUNTER — Telehealth: Payer: Self-pay | Admitting: Gastroenterology

## 2022-08-15 DIAGNOSIS — R1010 Upper abdominal pain, unspecified: Secondary | ICD-10-CM

## 2022-08-15 DIAGNOSIS — Q625 Duplication of ureter: Secondary | ICD-10-CM | POA: Insufficient documentation

## 2022-08-15 DIAGNOSIS — K389 Disease of appendix, unspecified: Secondary | ICD-10-CM

## 2022-08-15 DIAGNOSIS — R197 Diarrhea, unspecified: Secondary | ICD-10-CM

## 2022-08-15 DIAGNOSIS — I700 Atherosclerosis of aorta: Secondary | ICD-10-CM | POA: Insufficient documentation

## 2022-08-15 DIAGNOSIS — R935 Abnormal findings on diagnostic imaging of other abdominal regions, including retroperitoneum: Secondary | ICD-10-CM

## 2022-08-15 DIAGNOSIS — N289 Disorder of kidney and ureter, unspecified: Secondary | ICD-10-CM

## 2022-08-15 DIAGNOSIS — N2889 Other specified disorders of kidney and ureter: Secondary | ICD-10-CM

## 2022-08-15 DIAGNOSIS — R1012 Left upper quadrant pain: Secondary | ICD-10-CM

## 2022-08-15 DIAGNOSIS — R6881 Early satiety: Secondary | ICD-10-CM

## 2022-08-15 MED ORDER — IOPAMIDOL (ISOVUE-300) INJECTION 61%
100.0000 mL | Freq: Once | INTRAVENOUS | Status: AC | PRN
  Administered 2022-08-15: 100 mL via INTRAVENOUS

## 2022-08-15 NOTE — Telephone Encounter (Signed)
Patient calling about CT results.  She seen her results in [place] and results are very upsetting to her.  She thinks it might be appendicitis and is very concerned.  Patient was crying.  She wanted to speak to Dr. Claiborne Billings, I told pt she had already left office for today. She is scared if appendicitis, it might be rupture.  Please call patient this afternoon or tomorrow morning. [phone]

## 2022-08-15 NOTE — Telephone Encounter (Signed)
PCP spoke with pt.

## 2022-08-15 NOTE — Telephone Encounter (Signed)
Spoke with patient and confirmed appt for 08/16/2022

## 2022-08-15 NOTE — Telephone Encounter (Signed)
Patient was called and the following discussed: -thickened appearance of the distal appendix up to 1 cm in diameter  -Enlarging but simple appearing left retroperitoneal cyst along the upper margin of the left adnexa but not abutting the ovary, currently measuring up to 140 cc, formerly 23 cc. Recommend follow-up US in 6-12 months.   Hyperdense 1.5 by 1.2 cm exophytic lesion of the left mid kidney laterally with internal density of 117 Hounsfield units on portal venous phase images and similar density on delayed phase images. There is also a lesion of the right kidney lower pole measuring 58 Hounsfield units. Possibilities may include complex cysts or renal mass/malignancy.   -Discussed findings of cyst on the left and will repeat ultrasound in 6-12 months. - MRI - renal protocol ordered-for renal abnormalities noted.-Patient had been referred to GI urgently, she was instructed if she has not heard from them to schedule within 1 week to give [place] a call back.  If symptoms worsen before then, I urged her to go to the emergency room for further evaluation.  At this time do not believe appendicitis is the cause of her symptoms, given normal CBC and CRP.  Concern over possible appendix tumor/carcinoid as potential cause. -She was encouraged to follow-up 2-5 days after having MRI of her kidneys completed so that we can discuss all results. -Stop metformin for now  -Patient was encouraged to stop by the office to pick up 24-hour urine container and instructions.  5 HIAA Quant-24-hour urine ordered.  Patient appreciated call.  Reports understanding of instructions.  She does have a gastroenterology appointment tomorrow.

## 2022-08-15 NOTE — Telephone Encounter (Signed)
Pt scheduled to see Mike Gip PA 08/16/22 at 1:30pm. Please notify pt of appt.

## 2022-08-15 NOTE — Telephone Encounter (Signed)
Pt message forwarded to PCP.

## 2022-08-15 NOTE — Telephone Encounter (Signed)
Noted   and addressed in result note.

## 2022-08-15 NOTE — Telephone Encounter (Signed)
Critical imaging called in from Radiology. Results printed and given to PCP

## 2022-08-15 NOTE — Telephone Encounter (Signed)
Urgent referral in [place] for upper abd pain and diarrhea.  Patient said she is going for an urgent CT scan today.  Last saw a GI in [place] in 2018. Please review urgency and scheduling.  Thanks  Dr. Tomasa Rand DOD 7/17/24AM

## 2022-08-16 ENCOUNTER — Ambulatory Visit: Payer: Managed Care, Other (non HMO) | Admitting: Gastroenterology

## 2022-08-16 ENCOUNTER — Other Ambulatory Visit: Payer: Managed Care, Other (non HMO)

## 2022-08-16 ENCOUNTER — Encounter: Payer: Self-pay | Admitting: Gastroenterology

## 2022-08-16 VITALS — BP 122/68 | HR 69 | Ht 63.0 in | Wt 189.0 lb

## 2022-08-16 DIAGNOSIS — R9389 Abnormal findings on diagnostic imaging of other specified body structures: Secondary | ICD-10-CM | POA: Diagnosis not present

## 2022-08-16 DIAGNOSIS — K909 Intestinal malabsorption, unspecified: Secondary | ICD-10-CM | POA: Diagnosis not present

## 2022-08-16 NOTE — Patient Instructions (Addendum)
_______________________________________________________  If your blood pressure at your visit was 140/90 or greater, please contact your primary care physician to follow up on this.  _______________________________________________________  If you are age 69 or older, your body mass index should be between 23-30. Your Body mass index is 33.48 kg/m. If this is out of the aforementioned range listed, please consider follow up with your Primary Care Provider.  If you are age 58 or younger, your body mass index should be between 19-25. Your Body mass index is 33.48 kg/m. If this is out of the aformentioned range listed, please consider follow up with your Primary Care Provider.   ________________________________________________________  The [place] [facility] providers would like to encourage you to use [facility] to communicate with providers for non-urgent requests or questions.  Due to long hold times on the telephone, sending your provider a message by [facility] may be a faster and more efficient way to get a response.  Please allow 48 business hours for a response.  Please remember that this is for non-urgent requests.  _______________________________________________________   Due to recent changes in healthcare laws, you may see the results of your imaging and laboratory studies on MyChart before your provider has had a chance to review them.  We understand that in some cases there may be results that are confusing or concerning to you. Not all laboratory results come back in the same time frame and the provider may be waiting for multiple results in order to interpret others.  Please give [place] 48 hours in order for your provider to thoroughly review all the results before contacting the office for clarification of your results.   Your provider has requested that you go to the basement level for lab work before leaving today. Press "B" on the elevator. The lab is located at the first door on the left as you  exit the elevator.    It was a pleasure to see you today!  Thank you for trusting me with your gastrointestinal care!

## 2022-08-16 NOTE — Progress Notes (Signed)
Chief Complaint: Diarrhea Primary GI MD: Gentry Fitz  HPI: 67 year old female history of hypertension, OSA, IBS, type 2 diabetes, depression, presents for evaluation of Diarrhea  Patient has been seen by PCP for LLQ pain and diarrhea. CT abdomen pelvis with contrast 7/17: Abnormally thickened appearance of distal appendix up to 1 cm in diameter.  Mild acute appendicitis not excluded.  Enlarging retroperitoneal cyst along the left adnexa (follow-up ultrasound in 6 to 12 months).  Stable borderline prominence of dorsal pancreatic duct along portions of the pancreatic head  Lab work 08/08/2022 CBC unrevealing, CMP unrevealing CRP 1.8 Stool culture negative C. difficile negative Qualitative fecal fat was abnormal Fecal lactoferrin negative Lipase 21  Patient states she has had diarrhea ongoing since May.  Could have up to 3-4 bowel movements per hour.  Denies abdominal pain, nausea, vomiting.  Denies melena/hematochezia.  States she never has a formed stool.  She is currently on Bentyl which has provided some relief and allows her to only have 1-2 bowel movements per day.  She does report that they occasionally look oily/greasy.  PCP is currently working up thickened appendix and is concerned for carcinoid.  She is currently scheduled for MRI of the abdomen for further evaluation   PREVIOUS GI WORKUP   2018 - patient reports normal with repeat in 10 years  Past Medical History:  Diagnosis Date   Achilles bursitis or tendinitis 11/11/2012   right    Allergy    Angina at rest    Dr Anne Fu   CAD (coronary artery disease), native coronary artery    DES to LAD in 2 areas, one at the bifurcation of a diagonal branch and also in the mid to distal region. Her diagonal branch was also ballooned . She has a DES to RCA 10/10. Her circumflex artery mild ostial disease.   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   Tremor 03/23/2020    Past Surgical History:  Procedure Laterality Date   BREAST EXCISIONAL BIOPSY Right 1995   CAROTID STENT     CESAREAN SECTION  1983   FOOT SURGERY     LEFT HEART CATH AND CORONARY ANGIOGRAPHY N/A 06/04/2016   Procedure: Left Heart Cath and Coronary Angiography;  Surgeon: Lyn Records, MD;  Location: [facility] INVASIVE CV LAB;  Service: Cardiovascular;  Laterality: N/A;   LEFT HEART CATH AND CORONARY ANGIOGRAPHY N/A 12/04/2019   Procedure: LEFT HEART CATH AND CORONARY ANGIOGRAPHY;  Surgeon: Kathleene Hazel, MD;  Location: [facility] INVASIVE CV LAB;  Service: Cardiovascular;  Laterality: N/A;   LEFT HEART CATHETERIZATION WITH CORONARY ANGIOGRAM N/A 01/16/2011   Procedure: LEFT HEART CATHETERIZATION WITH CORONARY ANGIOGRAM;  Surgeon: Donato Schultz, MD;  Location: [facility] CATH LAB;  Service: Cardiovascular;  Laterality: N/A;   NOSE SURGERY     SINOSCOPY      Current Outpatient Medications  Medication Sig Dispense Refill   amLODipine (NORVASC)  5 MG tablet Take 1 tablet (5 mg total) by mouth daily. 90 tablet 1   aspirin EC 81 MG tablet Take 81 mg by mouth daily.     budesonide (PULMICORT) 0.5 MG/2ML nebulizer solution Use twice a day in a sinus rinse bottle. 120 mL 2   carvedilol (COREG) 6.25 MG tablet Take 1 tablet (6.25 mg total) by mouth 2 (two) times daily with a meal. 180 tablet 1   cetirizine (ZYRTEC ALLERGY) 10 MG tablet Take 1 tablet (10 mg total) by mouth daily. 30 tablet 5   cholestyramine (QUESTRAN) 4 g packet Take 1 packet (4 g total) by mouth 3 (three) times daily with meals.  90 each 2   cromolyn (OPTICROM) 4 % ophthalmic solution Place 1 drop into both eyes 4 (four) times daily as needed (itchy/watery eyes). 10 mL 5   dicyclomine (BENTYL) 10 MG capsule Take 1 capsule (10 mg total) by mouth 4 (four) times daily -  before meals and at bedtime. 90 capsule 1   isosorbide mononitrate (IMDUR) 60 MG 24 hr tablet Take 1 tablet (60 mg total) by mouth daily. 90 tablet 3   lisinopril-hydrochlorothiazide (ZESTORETIC) 20-12.5 MG tablet Take 1 tablet by mouth daily. 90 tablet 1   meloxicam (MOBIC) 15 MG tablet Take 1 tablet (15 mg total) by mouth daily. 90 tablet 1   montelukast (SINGULAIR) 10 MG tablet TAKE 1 TABLET BY MOUTH EVERYDAY AT BEDTIME 90 tablet 1   nitroGLYCERIN (NITROSTAT) 0.4 MG SL tablet Place 1 tablet (0.4 mg total) under the tongue every 5 (five) minutes as needed for chest pain. 25 tablet 3   omeprazole (PRILOSEC OTC) 20 MG tablet Take 20 mg by mouth daily as needed (acid reflux).     rosuvastatin (CRESTOR) 20 MG tablet TAKE 1 TABLET BY MOUTH EVERY DAY 90 tablet 0   sertraline (ZOLOFT) 100 MG tablet Take 1 tablet (100 mg total) by mouth at bedtime. 90 tablet 1   tirzepatide (MOUNJARO) 5 MG/0.5ML Pen Inject 5 mg into the skin once a week. 6 mL 1   Vitamin D, Ergocalciferol, (DRISDOL) 1.25 MG (50000 UNIT) CAPS capsule Take 1 capsule (50,000 Units total) by mouth every 7 (seven) days. 12 capsule 4   No current facility-administered medications for this visit.    Allergies as of 08/16/2022 - Review Complete 08/08/2022  Allergen Reaction Noted   Bupropion Nausea Only and Other (See Comments) 04/24/2019   Sulfa antibiotics Other (See Comments) 10/30/2017   Prednisone Anxiety and Other (See Comments) 10/30/2017    Family History  Problem Relation Age of Onset   Hypertension Mother    Heart disease Mother    COPD Mother    Ulcers Mother    Lung cancer Father    Hypertension Sister    Diabetes Sister    Arthritis Sister    Depression Sister    Miscarriages /  Stillbirths Sister    Early death Maternal Grandmother    Tuberculosis Maternal Grandmother    Lung cancer Maternal Grandfather    Suicidality Maternal Grandfather    Early death Maternal Grandfather    Heart failure Paternal Grandmother    Heart attack Paternal Grandmother    Hypertension Paternal Grandmother    Hyperlipidemia Paternal Grandmother    Heart disease Paternal Grandmother    Heart disease Paternal Grandfather    Hyperlipidemia Paternal Grandfather    Hypertension Paternal Grandfather    Hyperlipidemia Brother    Hypertension Brother    Intellectual disability Brother  Kidney disease Brother    Heart attack Brother    Asthma Brother    Early death Brother 14   Hyperlipidemia Brother    Hypertension Brother    Intellectual disability Brother    Kidney disease Brother    Heart disease Brother    Alcohol abuse Brother    Heart attack Other    Stroke Neg Hx    Breast cancer Neg Hx     Social History   Socioeconomic History   Marital status: Married    Spouse name: Not on file   Number of children: Not on file   Years of education: Not on file   Highest education level: Associate degree: academic program  Occupational History   Not on file  Tobacco Use   Smoking status: Never   Smokeless tobacco: Never  Vaping Use   Vaping status: Never Used  Substance and Sexual Activity   Alcohol use: Not Currently   Drug use: Never   Sexual activity: Yes    Partners: Male    Comment: husband  Other Topics Concern   Not on file  Social History Narrative   Marital status/children/pets: Married.  One child.   Education/employment: Retired.  Associates degree in college.   Safety:      -Wears a bicycle helmet riding a bike: Yes     -smoke alarm in the home:Yes     - wears seatbelt: Yes     - Feels safe in their relationships: Yes   Social Determinants of Health   Financial Resource Strain: Low Risk  (08/27/2021)   Overall Financial Resource Strain (CARDIA)     Difficulty of Paying Living Expenses: Not hard at all  Food Insecurity: No Food Insecurity (08/27/2021)   Hunger Vital Sign    Worried About Running Out of Food in the Last Year: Never true    Ran Out of Food in the Last Year: Never true  Transportation Needs: No Transportation Needs (08/27/2021)   PRAPARE - Administrator, Civil Service (Medical): No    Lack of Transportation (Non-Medical): No  Physical Activity: Unknown (08/27/2021)   Exercise Vital Sign    Days of Exercise per Week: 0 days    Minutes of Exercise per Session: Not on file  Stress: No Stress Concern Present (08/27/2021)   [employer] of Occupational Health - Occupational Stress Questionnaire    Feeling of Stress : Only a little  Social Connections: Socially Integrated (08/27/2021)   Social Connection and Isolation Panel [NHANES]    Frequency of Communication with Friends and Family: More than three times a week    Frequency of Social Gatherings with Friends and Family: More than three times a week    Attends Religious Services: 1 to 4 times per year    Active Member of [employer] or Organizations: Yes    Attends Engineer, structural: More than 4 times per year    Marital Status: Married  Catering manager Violence: Not on file    Review of Systems:    Constitutional: No weight loss, fever, chills, weakness or fatigue HEENT: Eyes: No change in vision               Ears, Nose, Throat:  No change in hearing or congestion Skin: No rash or itching Cardiovascular: No chest pain, chest pressure or palpitations   Respiratory: No SOB or cough Gastrointestinal: See HPI and otherwise negative Genitourinary: No dysuria or change in urinary frequency Neurological: No headache, dizziness  or syncope Musculoskeletal: No new muscle or joint pain Hematologic: No bleeding or bruising Psychiatric: No history of depression or anxiety    Physical Exam:  Vital signs: There were no vitals taken for this  visit.  Constitutional: NAD, Well developed, Well nourished, alert and cooperative Head:  Normocephalic and atraumatic. Eyes:   PEERL, EOMI. No icterus. Conjunctiva pink. Respiratory: Respirations even and unlabored. Lungs clear to auscultation bilaterally.   No wheezes, crackles, or rhonchi.  Cardiovascular:  Regular rate and rhythm. No peripheral edema, cyanosis or pallor.  Gastrointestinal:  Soft, nondistended, nontender. No rebound or guarding. Normal bowel sounds. No appreciable masses or hepatomegaly. Rectal:  Not performed.  Msk:  Symmetrical without gross deformities. Without edema, no deformity or joint abnormality.  Neurologic:  Alert and  oriented x4;  grossly normal neurologically.  Skin:   Dry and intact without significant lesions or rashes. Psychiatric: Oriented to person, place and time. Demonstrates good judgement and reason without abnormal affect or behaviors.   RELEVANT LABS AND IMAGING: CBC    Component Value Date/Time   WBC 5.3 08/08/2022 1202   RBC 4.58 08/08/2022 1202   HGB 12.2 08/08/2022 1202   HGB 12.2 11/23/2019 1442   HCT 37.9 08/08/2022 1202   HCT 37.1 11/23/2019 1442   PLT 405.0 (H) 08/08/2022 1202   PLT 390 11/23/2019 1442   MCV 82.7 08/08/2022 1202   MCV 82 11/23/2019 1442   MCH 25.9 (L) 06/01/2022 1504   MCHC 32.2 08/08/2022 1202   RDW 16.6 (H) 08/08/2022 1202   RDW 14.6 11/23/2019 1442   LYMPHSABS 2.2 08/08/2022 1202   LYMPHSABS 2.0 05/30/2016 1018   MONOABS 0.8 08/08/2022 1202   EOSABS 0.4 08/08/2022 1202   EOSABS 0.2 05/30/2016 1018   BASOSABS 0.0 08/08/2022 1202   BASOSABS 0.0 05/30/2016 1018    CMP     Component Value Date/Time   NA 135 08/08/2022 1202   NA 136 11/23/2019 1442   K 4.2 08/08/2022 1202   CL 103 08/08/2022 1202   CO2 24 08/08/2022 1202   GLUCOSE 98 08/08/2022 1202   BUN 22 08/08/2022 1202   BUN 17 11/23/2019 1442   CREATININE 0.94 08/08/2022 1202   CREATININE 1.26 (H) 06/01/2022 1504   CALCIUM 9.7 08/08/2022  1202   PROT 7.5 08/08/2022 1202   ALBUMIN 4.1 08/08/2022 1202   AST 11 08/08/2022 1202   ALT 9 08/08/2022 1202   ALKPHOS 68 08/08/2022 1202   BILITOT 0.3 08/08/2022 1202   GFRNONAA 86 11/23/2019 1442   GFRAA 99 11/23/2019 1442     Assessment/Plan:   Steatorrhea History of diabetes with frequent loose bowel movements that appear oily/greasy on with abnormal fecal fat.  Suspect possible pancreatic insufficiency. -Pancreatic elastase -Continue Bentyl as it is controlling her symptoms -Pancreatic elastase is negative, would recommend colonoscopy for further evaluation of diarrhea to rule out microscopic colitis  Abnormal CT scan Discussed with patient and sister that colonoscopy can only reveal the appendiceal orifice.  CT scan shows thickened distal appendix (1 cm in diameter) and concern for mild appendicitis.  Less concern for mild appendicitis as patient is asymptomatic without abdominal pain.  Currently scheduled for MRI -Reminder made to check MRI results  [facility] 08/16/2022, 1:44 PM  Cc: Felix Pacini A, DO

## 2022-08-17 ENCOUNTER — Other Ambulatory Visit: Payer: Managed Care, Other (non HMO)

## 2022-08-17 ENCOUNTER — Ambulatory Visit: Payer: Managed Care, Other (non HMO)

## 2022-08-17 DIAGNOSIS — K909 Intestinal malabsorption, unspecified: Secondary | ICD-10-CM

## 2022-08-17 DIAGNOSIS — R9389 Abnormal findings on diagnostic imaging of other specified body structures: Secondary | ICD-10-CM

## 2022-08-17 DIAGNOSIS — K389 Disease of appendix, unspecified: Secondary | ICD-10-CM

## 2022-08-17 DIAGNOSIS — R197 Diarrhea, unspecified: Secondary | ICD-10-CM

## 2022-08-17 DIAGNOSIS — R935 Abnormal findings on diagnostic imaging of other abdominal regions, including retroperitoneum: Secondary | ICD-10-CM

## 2022-08-17 DIAGNOSIS — R6881 Early satiety: Secondary | ICD-10-CM

## 2022-08-20 NOTE — Progress Notes (Signed)
Agree with the assessment and plan as outlined by Boone Master, PA-C.  If appendix appears persistently abnormal on MRI, would refer to surgery.  Colonoscopy unlikely to be useful given abnormality limited to distal appendix, but agree that colonoscopy to rule out microscopic colitis reasonable if diarrhea not resolving.  5-HIAA pending for further eval of carcinoid syndrome.

## 2022-08-22 ENCOUNTER — Telehealth: Payer: Self-pay | Admitting: Family Medicine

## 2022-08-22 NOTE — Telephone Encounter (Signed)
Cholestyramine -pt is wondering what this medication is for. Believes it is for her cholesterol.  Labs are still pending with a 1-5 day turn around time. With the outage it may take longer to get completed

## 2022-08-22 NOTE — Telephone Encounter (Signed)
Spoke with patient regarding results/recommendations.  

## 2022-08-22 NOTE — Telephone Encounter (Signed)
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

## 2022-08-22 NOTE — Telephone Encounter (Signed)
Patient would like a CMA to return her call, she has some questions regarding a test she had preformed last week and also some questions regarding a medication she was prescribed by Dr. Claiborne Billings. Please give the patient a call to discuss her concerns.

## 2022-08-24 ENCOUNTER — Telehealth: Payer: Self-pay

## 2022-08-24 ENCOUNTER — Encounter: Payer: Self-pay | Admitting: Family Medicine

## 2022-08-24 ENCOUNTER — Telehealth: Payer: Self-pay | Admitting: Family Medicine

## 2022-08-24 LAB — 5 HIAA, QUANTITATIVE, URINE, 24 HOUR: Total Volume: 2400 mL

## 2022-08-24 NOTE — Telephone Encounter (Signed)
Patient will be sending over the denial letter for MRI through My[facility]

## 2022-08-24 NOTE — Telephone Encounter (Signed)
noted 

## 2022-08-24 NOTE — Telephone Encounter (Signed)
Pt called and stated that her MRI was denied by insurance.

## 2022-08-27 ENCOUNTER — Other Ambulatory Visit: Payer: Managed Care, Other (non HMO)

## 2022-08-27 ENCOUNTER — Ambulatory Visit: Payer: Managed Care, Other (non HMO) | Admitting: Family Medicine

## 2022-08-27 ENCOUNTER — Telehealth: Payer: Self-pay | Admitting: Gastroenterology

## 2022-08-27 NOTE — Telephone Encounter (Signed)
Patient called states she has not received lab results.

## 2022-08-27 NOTE — Telephone Encounter (Signed)
Pt states this will need to be resubmitted under case # [id number]  Not sure if this is a situation for you or not. But if not, please advise on correct person

## 2022-08-28 ENCOUNTER — Other Ambulatory Visit: Payer: Self-pay | Admitting: *Deleted

## 2022-08-28 ENCOUNTER — Encounter: Payer: Self-pay | Admitting: Gastroenterology

## 2022-08-28 ENCOUNTER — Encounter: Payer: Self-pay | Admitting: *Deleted

## 2022-08-28 DIAGNOSIS — K909 Intestinal malabsorption, unspecified: Secondary | ICD-10-CM

## 2022-08-28 DIAGNOSIS — R9389 Abnormal findings on diagnostic imaging of other specified body structures: Secondary | ICD-10-CM

## 2022-08-28 MED ORDER — NA SULFATE-K SULFATE-MG SULF 17.5-3.13-1.6 GM/177ML PO SOLN: 1.0000 | Freq: Once | ORAL | 0 refills | Status: AC

## 2022-08-28 NOTE — Telephone Encounter (Signed)
See 08/17/22 stool study result note for details

## 2022-08-29 ENCOUNTER — Telehealth: Payer: Self-pay | Admitting: Family Medicine

## 2022-08-29 ENCOUNTER — Ambulatory Visit
Admission: RE | Admit: 2022-08-29 | Discharge: 2022-08-29 | Disposition: A | Discharge status: Home / Self Care | Payer: Managed Care, Other (non HMO) | Source: Ambulatory Visit | Attending: Family Medicine | Admitting: Family Medicine

## 2022-08-29 DIAGNOSIS — M8580 Other specified disorders of bone density and structure, unspecified site: Secondary | ICD-10-CM

## 2022-08-29 NOTE — Telephone Encounter (Signed)
Please inform patient Her bone density results have returned and she is still in the osteopenic range.  There has been mild improvement in her bone density since her last study in 2022.  Continue to maintain adequate levels of vitamin D and calcium. Limit caffeine. Routine weightbearing exercise, such as walking 15 minutes daily on a hard surface is also helpful in maintaining bone density

## 2022-08-30 ENCOUNTER — Other Ambulatory Visit: Payer: Managed Care, Other (non HMO)

## 2022-08-31 ENCOUNTER — Encounter: Payer: Self-pay | Admitting: Family Medicine

## 2022-08-31 ENCOUNTER — Ambulatory Visit: Payer: Managed Care, Other (non HMO) | Admitting: Family Medicine

## 2022-08-31 VITALS — BP 124/78 | HR 56 | Temp 98.0°F | Wt 188.4 lb

## 2022-08-31 DIAGNOSIS — I25118 Atherosclerotic heart disease of native coronary artery with other forms of angina pectoris: Secondary | ICD-10-CM

## 2022-08-31 DIAGNOSIS — I10 Essential (primary) hypertension: Secondary | ICD-10-CM

## 2022-08-31 DIAGNOSIS — F3340 Major depressive disorder, recurrent, in remission, unspecified: Secondary | ICD-10-CM | POA: Diagnosis not present

## 2022-08-31 DIAGNOSIS — H1010 Acute atopic conjunctivitis, unspecified eye: Secondary | ICD-10-CM

## 2022-08-31 DIAGNOSIS — E1169 Type 2 diabetes mellitus with other specified complication: Secondary | ICD-10-CM | POA: Diagnosis not present

## 2022-08-31 DIAGNOSIS — I700 Atherosclerosis of aorta: Secondary | ICD-10-CM

## 2022-08-31 DIAGNOSIS — I200 Unstable angina: Secondary | ICD-10-CM

## 2022-08-31 DIAGNOSIS — Q625 Duplication of ureter: Secondary | ICD-10-CM

## 2022-08-31 DIAGNOSIS — Z955 Presence of coronary angioplasty implant and graft: Secondary | ICD-10-CM

## 2022-08-31 DIAGNOSIS — I25119 Atherosclerotic heart disease of native coronary artery with unspecified angina pectoris: Secondary | ICD-10-CM

## 2022-08-31 DIAGNOSIS — E785 Hyperlipidemia, unspecified: Secondary | ICD-10-CM

## 2022-08-31 DIAGNOSIS — M8589 Other specified disorders of bone density and structure, multiple sites: Secondary | ICD-10-CM

## 2022-08-31 DIAGNOSIS — N289 Disorder of kidney and ureter, unspecified: Secondary | ICD-10-CM

## 2022-08-31 DIAGNOSIS — J3089 Other allergic rhinitis: Secondary | ICD-10-CM

## 2022-08-31 LAB — POCT GLYCOSYLATED HEMOGLOBIN (HGB A1C)
HbA1c POC (<> result, manual entry): 5.5 % (ref 4.0–5.6)
HbA1c, POC (controlled diabetic range): 5.5 % (ref 0.0–7.0)
HbA1c, POC (prediabetic range): 5.5 % — AB (ref 5.7–6.4)
Hemoglobin A1C: 5.5 % (ref 4.0–5.6)

## 2022-08-31 MED ORDER — MONTELUKAST SODIUM 10 MG PO TABS: ORAL_TABLET | ORAL | 1 refills | Status: DC

## 2022-08-31 MED ORDER — SERTRALINE HCL 100 MG PO TABS: 100.0000 mg | ORAL_TABLET | Freq: Every evening | ORAL | 1 refills | Status: DC

## 2022-08-31 MED ORDER — ROSUVASTATIN CALCIUM 20 MG PO TABS: 20.0000 mg | ORAL_TABLET | Freq: Every day | ORAL | 3 refills | Status: DC

## 2022-08-31 MED ORDER — AMLODIPINE BESYLATE 5 MG PO TABS: 5.0000 mg | ORAL_TABLET | Freq: Every day | ORAL | 1 refills | Status: DC

## 2022-08-31 MED ORDER — LISINOPRIL-HYDROCHLOROTHIAZIDE 20-12.5 MG PO TABS: 1.0000 | ORAL_TABLET | Freq: Every day | ORAL | 1 refills | Status: DC

## 2022-08-31 MED ORDER — MELOXICAM 15 MG PO TABS: 15.0000 mg | ORAL_TABLET | Freq: Every day | ORAL | 1 refills | Status: DC

## 2022-08-31 MED ORDER — CARVEDILOL 6.25 MG PO TABS: 6.2500 mg | ORAL_TABLET | Freq: Two times a day (BID) | ORAL | 1 refills | Status: DC

## 2022-08-31 NOTE — Telephone Encounter (Signed)
noted 

## 2022-08-31 NOTE — Progress Notes (Signed)
Patient ID: Kristie Owens, female  DOB: February 17, 1955, 67 y.o.   MRN: [id number] Patient Care Team    Relationship Specialty Notifications Start End  Natalia Leatherwood, DO PCP - General Family Medicine  06/09/19   Jake Bathe, MD PCP - Cardiology Cardiology  12/17/19   Jake Bathe, MD Consulting Physician Cardiology  06/08/19   Jill Side, OD Referring Physician   06/11/19   Moody Bruins Physician Assistant Chiropractic Medicine  06/11/19   Jill Side, OD Referring Physician   03/13/21     Chief Complaint  Patient presents with   Hypertension    Subjective: Kristie Owens is a 67 y.o.  Female  present for chronic condition manage All past medical history, surgical history, allergies, family history, immunizations, medications and social history were updated in the electronic medical record today. All recent labs, ED visits and hospitalizations within the last year were reviewed.   Hypertension/hyperlipidemia/coronary artery disease/TIA: Pt reports compliance with lisinopril-HCTZ 20-12.5, amlodipine 5 mg, Coreg 6.25 mg twice daily, Crestor 20 mg daily. Patient denies chest pain, shortness of breath, dizziness or lower extremity edema.   Patient is established with Dr. Anne Fu for cardiology.  Has a history of coronary artery disease status post heart catheterization in December 2012 and May 2018 demonstrating widely patent LAD and RCA stent, mild irregularity of the ostium of the diagonal where PTCA was felt to be high risk RF: Hypertension, hyperlipidemia, obesity, family history of heart disease   Echocardiogram 12/07/2019 IMPRESSIONS   1. Left ventricular ejection fraction, by estimation, is 65 to 70%. The  left ventricle has normal function. The left ventricle has no regional  wall motion abnormalities. There is mild left ventricular hypertrophy.  Left ventricular diastolic parameters  are indeterminate.   2. Right ventricular systolic function is normal. The  right ventricular  size is normal. Tricuspid regurgitation signal is inadequate for assessing  PA pressure.   3. The mitral valve is normal in structure. No evidence of mitral valve  regurgitation.   4. The aortic valve was not well visualized. Aortic valve regurgitation  is not visualized. Mild aortic valve sclerosis is present, with no  evidence of aortic valve stenosis   Cardiac catheterization on  12/04/2019 1. Patent proximal and mid LAD stents 2. Small caliber circumflex with mild to moderate non-obstructive disease 3. Large dominant RCA with patent mid stent, mild proximal stenosis. Moderate disease in the PDA and posterolateral artery, unchanged from last cath.  4. Normal LV systolic function, LVEF=65-70%. LVEDP 16   Recommendations: Continue medical management of CAD.  06/04/16: Widely patent proximal and distal LAD stents. 40% ostial narrowing in the first diagonal. Patent mid right coronary stents. Proximal 40% RCA eccentric narrowing. Circumflex is a small vessel with 40-50% first obtuse marginal. Ramus intermedius (or first obtuse marginal depending upon categorization) contains 40% proximal narrowing. Normal LV function. Elevated LVEDP at 23 mmHg. EF 55%.   Depression/anxiety/insomnia: Patient has a history of depression with anxiety, as well as insomnia.  She reports compliance with Zoloft 100 mg daily.  She feels this medication is working well for her Tried for insomnia: Belsomra, Ambien, Remeron, trazodone, vistaril. Nothing has worked well for her.    Allergic rhinitis: Patient reports compliance with Singulair and dymista nasal spray.  She is taking allegra.    Diabetes type 2: Patient has history of mildly elevated A1c in the diabetic range. She is watching her diet and metformin recently discontinued  secondary to diarrhea of other causes.  GLP-on hold until after GI studies completed.   Patient denies dizziness, hyperglycemic or hypoglycemic events. Patient denies  numbness, tingling in the extremities or nonhealing wounds of feet.       08/08/2022   11:23 AM 03/12/2022    8:04 AM 08/28/2021    9:04 AM 03/13/2021   11:15 AM 11/21/2020    9:06 AM  Depression screen PHQ 2/9  Decreased Interest 0 0 0 0 0  Down, Depressed, Hopeless 0 0 0 0 0  PHQ - 2 Score 0 0 0 0 0  Altered sleeping  3 2 3 3   Tired, decreased energy  1 1 1 1   Change in appetite  0 0 2 0  Feeling bad or failure about yourself   0 0 0 0  Trouble concentrating  0 0 0 0  Moving slowly or fidgety/restless  0 0 0 0  Suicidal thoughts  0 0 0 0  PHQ-9 Score  4 3 6 4       03/12/2022    8:05 AM 08/28/2021    9:04 AM 03/13/2021   11:15 AM 11/21/2020    9:06 AM  GAD 7 : Generalized Anxiety Score  Nervous, Anxious, on Edge 0 0 0 0  Control/stop worrying 0 0 0 0  Worry too much - different things 0 0 0 0  Trouble relaxing 0 1 1 0  Restless 0 0 1 0  Easily annoyed or irritable 0 0 0 0  Afraid - awful might happen 0 0 0 0  Total GAD 7 Score 0 1 2 0    Immunization History  Administered Date(s) Administered   Fluad Quad(high Dose 65+) 10/19/2019, 11/21/2020, 12/27/2021   Influenza Split 12/28/2010, 11/22/2011, 11/13/2012   Influenza, High Dose Seasonal PF 11/21/2018   Influenza,inj,Quad PF,6+ Mos 12/15/2013, 12/13/2016, 11/21/2017   Influenza,inj,quad, With Preservative 09/19/2015   Influenza-Unspecified 11/21/2018   Moderna Covid-19 Vaccine Bivalent Booster 27yrs & up 03/27/2021   Moderna Sars-Covid-2 Vaccination 02/19/2019, 03/27/2019, 12/19/2019   Pneumococcal Conjugate-13 11/21/2017   Pneumococcal Polysaccharide-23 11/25/2018   Tdap 08/10/2013   Zoster Recombinant(Shingrix) 12/30/2019, 11/21/2020   Past Medical History:  Diagnosis Date   Achilles bursitis or tendinitis 11/11/2012   right    Allergy    Angina at rest    Dr Anne Fu   CAD (coronary artery disease), native coronary artery    DES to LAD in 2 areas, one at the bifurcation of a diagonal branch and also in the  mid to distal region. Her diagonal branch was also ballooned . She has a DES to RCA 10/10. Her circumflex artery mild ostial disease.   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   Tremor 03/23/2020   Allergies  Allergen Reactions   Bupropion Nausea Only and Other (See Comments)    Nausea and headaches    Sulfa Antibiotics Other (See Comments)  From childhood- reaction not recalled   Prednisone Anxiety and Other (See Comments)    "Made me not feel like myself, altered"   Past Surgical History:  Procedure Laterality Date   BREAST EXCISIONAL BIOPSY Right 1995   CAROTID STENT     CESAREAN SECTION  1983   FOOT SURGERY     LEFT HEART CATH AND CORONARY ANGIOGRAPHY N/A 06/04/2016   Procedure: Left Heart Cath and Coronary Angiography;  Surgeon: Lyn Records, MD;  Location: [facility] INVASIVE CV LAB;  Service: Cardiovascular;  Laterality: N/A;   LEFT HEART CATH AND CORONARY ANGIOGRAPHY N/A 12/04/2019   Procedure: LEFT HEART CATH AND CORONARY ANGIOGRAPHY;  Surgeon: Kathleene Hazel, MD;  Location: [facility] INVASIVE CV LAB;  Service: Cardiovascular;  Laterality: N/A;   LEFT HEART CATHETERIZATION WITH CORONARY ANGIOGRAM N/A 01/16/2011   Procedure: LEFT HEART CATHETERIZATION WITH CORONARY ANGIOGRAM;  Surgeon: Donato Schultz, MD;  Location: [facility] CATH LAB;  Service: Cardiovascular;  Laterality: N/A;   NOSE SURGERY     SINOSCOPY     Family History  Problem Relation Age of Onset   Hypertension Mother    Heart disease Mother    COPD  Mother    Ulcers Mother    Lung cancer Father    Hypertension Sister    Diabetes Sister    Arthritis Sister    Depression Sister    Miscarriages / Stillbirths Sister    Early death Maternal Grandmother    Tuberculosis Maternal Grandmother    Lung cancer Maternal Grandfather    Suicidality Maternal Grandfather    Early death Maternal Grandfather    Heart failure Paternal Grandmother    Heart attack Paternal Grandmother    Hypertension Paternal Grandmother    Hyperlipidemia Paternal Grandmother    Heart disease Paternal Grandmother    Heart disease Paternal Grandfather    Hyperlipidemia Paternal Grandfather    Hypertension Paternal Grandfather    Hyperlipidemia Brother    Hypertension Brother    Intellectual disability Brother    Kidney disease Brother    Heart attack Brother    Asthma Brother    Early death Brother 76   Hyperlipidemia Brother    Hypertension Brother    Intellectual disability Brother    Kidney disease Brother    Heart disease Brother    Alcohol abuse Brother    Heart attack Other    Stroke Neg Hx    Breast cancer Neg Hx    Social History   Social History Narrative   Marital status/children/pets: Married.  One child.   Education/employment: Retired.  Associates degree in college.   Safety:      -Wears a bicycle helmet riding a bike: Yes     -smoke alarm in the home:Yes     - wears seatbelt: Yes     - Feels safe in their relationships: Yes    Allergies as of 08/31/2022       Reactions   Bupropion Nausea Only, Other (See Comments)   Nausea and headaches    Sulfa Antibiotics Other (See Comments)   From childhood- reaction not recalled   Prednisone Anxiety, Other (See Comments)   "Made me not feel like myself, altered"        Medication List        Accurate as of August 31, 2022  8:52 AM. If you have any questions, ask your nurse or doctor.          STOP taking these medications    isosorbide mononitrate  60 MG 24 hr tablet Commonly  known as: IMDUR Stopped by: Felix Pacini       TAKE these medications    amLODipine 5 MG tablet Commonly known as: NORVASC Take 1 tablet (5 mg total) by mouth daily.   aspirin EC 81 MG tablet Take 81 mg by mouth daily.   budesonide 0.5 MG/2ML nebulizer solution Commonly known as: Pulmicort Use twice a day in a sinus rinse bottle.   carvedilol 6.25 MG tablet Commonly known as: COREG Take 1 tablet (6.25 mg total) by mouth 2 (two) times daily with a meal.   cetirizine 10 MG tablet Commonly known as: ZyrTEC Allergy Take 1 tablet (10 mg total) by mouth daily.   cholestyramine 4 g packet Commonly known as: Questran Take 1 packet (4 g total) by mouth 3 (three) times daily with meals.   cromolyn 4 % ophthalmic solution Commonly known as: OPTICROM Place 1 drop into both eyes 4 (four) times daily as needed (itchy/watery eyes).   dicyclomine 10 MG capsule Commonly known as: BENTYL Take 1 capsule (10 mg total) by mouth 4 (four) times daily -  before meals and at bedtime.   lisinopril-hydrochlorothiazide 20-12.5 MG tablet Commonly known as: ZESTORETIC Take 1 tablet by mouth daily.   meloxicam 15 MG tablet Commonly known as: MOBIC Take 1 tablet (15 mg total) by mouth daily.   montelukast 10 MG tablet Commonly known as: SINGULAIR TAKE 1 TABLET BY MOUTH EVERYDAY AT BEDTIME   nitroGLYCERIN 0.4 MG SL tablet Commonly known as: NITROSTAT Place 1 tablet (0.4 mg total) under the tongue every 5 (five) minutes as needed for chest pain.   omeprazole 20 MG tablet Commonly known as: PRILOSEC OTC Take 20 mg by mouth daily as needed (acid reflux).   rosuvastatin 20 MG tablet Commonly known as: CRESTOR Take 1 tablet (20 mg total) by mouth daily.   sertraline 100 MG tablet Commonly known as: ZOLOFT Take 1 tablet (100 mg total) by mouth at bedtime.   tirzepatide 5 MG/0.5ML Pen Commonly known as: MOUNJARO Inject 5 mg into the skin once a week.   Vitamin D (Ergocalciferol) 1.25  MG (50000 UNIT) Caps capsule Commonly known as: DRISDOL Take 1 capsule (50,000 Units total) by mouth every 7 (seven) days.        All past medical history, surgical history, allergies, family history, immunizations andmedications were updated in the EMR today and reviewed under the history and medication portions of their EMR.       ROS 14 pt review of systems performed and negative (unless mentioned in an HPI)  Objective: BP 124/78   Pulse (!) 56   Temp 98 F (36.7 C)   Wt 188 lb 6.4 oz (85.5 kg)   SpO2 98%   BMI 33.37 kg/m  Physical Exam Vitals and nursing note reviewed.  Constitutional:      General: She is not in acute distress.    Appearance: Normal appearance. She is not ill-appearing, toxic-appearing or diaphoretic.  HENT:     Head: Normocephalic and atraumatic.  Eyes:     General: No scleral icterus.       Right eye: No discharge.        Left eye: No discharge.     Extraocular Movements: Extraocular movements intact.     Conjunctiva/sclera: Conjunctivae normal.     Pupils: Pupils are equal, round, and reactive to light.  Cardiovascular:     Rate and Rhythm: Normal rate and regular rhythm.  Pulmonary:  Effort: Pulmonary effort is normal. No respiratory distress.     Breath sounds: Normal breath sounds. No wheezing, rhonchi or rales.  Musculoskeletal:     Right lower leg: No edema.     Left lower leg: No edema.  Skin:    General: Skin is warm.     Findings: No rash.  Neurological:     Mental Status: She is alert and oriented to person, place, and time. Mental status is at baseline.     Motor: No weakness.     Gait: Gait normal.  Psychiatric:        Mood and Affect: Mood normal.        Behavior: Behavior normal.        Thought Content: Thought content normal.        Judgment: Judgment normal.     Diabetic Foot Exam - Simple   No data filed      No results found.  Assessment/plan: Kristie Owens is a 67 y.o. female present for chronic  condition manage Osteoarthritis Stable Continue Mobic qd with food.  - continue  OTC voltaren gel/cream.  - discussed cherry tart extract and tumeric natural supplements and their SE potentials  - ANA, RF, CCP, ESR, CRP > WNL   Major depressive disorder, remission status unspecified, unspecified whether recurrent/insomnia Stable Continue Zoloft 100 mg daily. Discontinued Ambien 2021.   DCd trazodone taper 25-75 mg nightly.> not helpful Dc vistaril> not helpful and felt bad on higher dose.  DC'd belsomra > discontinued> expensive, will low-dose worked okay but not well.    Diabetes type 2: Stable Patient encouraged to continue exercise and dietary modifications.  Discontinued metformin for now secondary to  diarrhea of unknown cause GLP- on hold a1c improved today 5.7 (was 6.7)> 5.8> 6.6 > 5.9>5.5collected today.   Eye exam up-to-date 02/28/2022 Foot exam completed 03/12/2022 - Microalbumin / creatinine urine ratio2/2024 UTD    Essential hypertension/hyperlipidemia/Presence of coronary angioplasty implant and graft/Atherosclerosis of native coronary artery of native heart with other form of angina pectoris (HCC)/Intermediate coronary syndrome (HCC)/ Palpitations/Transient neurologic deficit/Morbid obesity (HCC) Stable Continue daily exercise. Continue low-sodium/heart healthy diet. Continue amlodipine 5 mg daily Continue Coreg 6.25 mg twice daily Continue lisinopril-HCTZ 20-12.5 mg daily Continue Crestor 20 mg daily Continue baby aspirin daily  Allergic rhinitis, unspecified seasonality, unspecified trigger Continue antihistamine of choice.  OTC  Continue Singulair Est with A&A  Return in about 6 months (around 03/14/2023) for cpe (20 min), Routine chronic condition follow-up.  Orders Placed This Encounter  Procedures   Ambulatory referral to Urology   POCT glycosylated hemoglobin (Hb A1C)   Meds ordered this encounter  Medications   amLODipine (NORVASC) 5 MG tablet     Sig: Take 1 tablet (5 mg total) by mouth daily.    Dispense:  90 tablet    Refill:  1   carvedilol (COREG) 6.25 MG tablet    Sig: Take 1 tablet (6.25 mg total) by mouth 2 (two) times daily with a meal.    Dispense:  180 tablet    Refill:  1   lisinopril-hydrochlorothiazide (ZESTORETIC) 20-12.5 MG tablet    Sig: Take 1 tablet by mouth daily.    Dispense:  90 tablet    Refill:  1   meloxicam (MOBIC) 15 MG tablet    Sig: Take 1 tablet (15 mg total) by mouth daily.    Dispense:  90 tablet    Refill:  1   montelukast (SINGULAIR) 10 MG tablet  Sig: TAKE 1 TABLET BY MOUTH EVERYDAY AT BEDTIME    Dispense:  90 tablet    Refill:  1   rosuvastatin (CRESTOR) 20 MG tablet    Sig: Take 1 tablet (20 mg total) by mouth daily.    Dispense:  90 tablet    Refill:  3   sertraline (ZOLOFT) 100 MG tablet    Sig: Take 1 tablet (100 mg total) by mouth at bedtime.    Dispense:  90 tablet    Refill:  1   Referral Orders         Ambulatory referral to Urology      Electronically signed by: Felix Pacini, DO [facility] [place]

## 2022-08-31 NOTE — Patient Instructions (Addendum)
Return in about 6 months (around 03/14/2023) for cpe (20 min), Routine chronic condition follow-up.        Great to see you today.  I have refilled the medication(s) we provide.   If labs were collected or images ordered, we will inform you of  results once we have received them and reviewed. We will contact you either by echart message, or telephone call.  Please give ample time to the testing facility, and our office to run,  receive and review results. Please do not call inquiring of results, even if you can see them in your chart. We will contact you as soon as we are able. If it has been over 1 week since the test was completed, and you have not yet heard from [place], then please call us.    - echart message- for normal results that have been seen by the patient already.   - telephone call: abnormal results or if patient has not viewed results in their echart.  If a referral to a specialist was entered for you, please call us in 2 weeks if you have not heard from the specialist office to schedule.

## 2022-09-05 ENCOUNTER — Ambulatory Visit: Payer: Managed Care, Other (non HMO) | Admitting: Gastroenterology

## 2022-09-05 ENCOUNTER — Other Ambulatory Visit: Payer: Self-pay | Admitting: Gastroenterology

## 2022-09-05 ENCOUNTER — Encounter: Payer: Self-pay | Admitting: Gastroenterology

## 2022-09-05 VITALS — BP 99/57 | HR 53 | Temp 96.9°F | Resp 14 | Ht 63.0 in | Wt 189.0 lb

## 2022-09-05 DIAGNOSIS — D120 Benign neoplasm of cecum: Secondary | ICD-10-CM

## 2022-09-05 DIAGNOSIS — R9389 Abnormal findings on diagnostic imaging of other specified body structures: Secondary | ICD-10-CM

## 2022-09-05 DIAGNOSIS — D123 Benign neoplasm of transverse colon: Secondary | ICD-10-CM | POA: Diagnosis present

## 2022-09-05 DIAGNOSIS — R197 Diarrhea, unspecified: Secondary | ICD-10-CM

## 2022-09-05 DIAGNOSIS — K635 Polyp of colon: Secondary | ICD-10-CM | POA: Diagnosis not present

## 2022-09-05 MED ORDER — SODIUM CHLORIDE 0.9 % IV SOLN: 500.0000 mL | Freq: Once | INTRAVENOUS | Status: DC

## 2022-09-05 NOTE — Patient Instructions (Signed)
Handouts Provided:  Polyps  YOU HAD AN ENDOSCOPIC PROCEDURE TODAY AT [facility]:   Refer to the procedure report that was given to you for any specific questions about what was found during the examination.  If the procedure report does not answer your questions, please call your gastroenterologist to clarify.  If you requested that your care partner not be given the details of your procedure findings, then the procedure report has been included in a sealed envelope for you to review at your convenience later.  YOU SHOULD EXPECT: Some feelings of bloating in the abdomen. Passage of more gas than usual.  Walking can help get rid of the air that was put into your GI tract during the procedure and reduce the bloating. If you had a lower endoscopy (such as a colonoscopy or flexible sigmoidoscopy) you may notice spotting of blood in your stool or on the toilet paper. If you underwent a bowel prep for your procedure, you may not have a normal bowel movement for a few days.  Please Note:  You might notice some irritation and congestion in your nose or some drainage.  This is from the oxygen used during your procedure.  There is no need for concern and it should clear up in a day or so.  SYMPTOMS TO REPORT IMMEDIATELY:  Following lower endoscopy (colonoscopy or flexible sigmoidoscopy):  Excessive amounts of blood in the stool  Significant tenderness or worsening of abdominal pains  Swelling of the abdomen that is new, acute  Fever of 100F or higher  For urgent or emergent issues, a gastroenterologist can be reached at any hour by calling [phone]. Do not use MyChart messaging for urgent concerns.    DIET:  We do recommend a small meal at first, but then you may proceed to your regular diet.  Drink plenty of fluids but you should avoid alcoholic beverages for 24 hours.  ACTIVITY:  You should plan to take it easy for the rest of today and you should NOT DRIVE or use heavy  machinery until tomorrow (because of the sedation medicines used during the test).    FOLLOW UP: Our staff will call the number listed on your records the next business day following your procedure.  We will call around 7:15- 8:00 am to check on you and address any questions or concerns that you may have regarding the information given to you following your procedure. If we do not reach you, we will leave a message.     If any biopsies were taken you will be contacted by phone or by letter within the next 1-3 weeks.  Please call us at [phone] if you have not heard about the biopsies in 3 weeks.    SIGNATURES/CONFIDENTIALITY: You and/or your care partner have signed paperwork which will be entered into your electronic medical record.  These signatures attest to the fact that that the information above on your After Visit Summary has been reviewed and is understood.  Full responsibility of the confidentiality of this discharge information lies with you and/or your care-partner.

## 2022-09-05 NOTE — Progress Notes (Signed)
Called to room to assist during endoscopic procedure.  Patient ID and intended procedure confirmed with present staff. Received instructions for my participation in the procedure from the performing physician.  

## 2022-09-05 NOTE — Progress Notes (Signed)
Vss nad trans to pacu 

## 2022-09-05 NOTE — Op Note (Signed)
[facility] Patient Name: Kristie Owens Procedure Date: 09/05/2022 9:38 AM MRN: [id number] Endoscopist: Lorin Picket E. Tomasa Rand , MD, [id number] Age: 67 Referring MD:  Date of Birth: 04-05-1955 Gender: Female Account #: [id number] Procedure:                Colonoscopy Indications:              Chronic diarrhea Medicines:                Monitored Anesthesia Care Procedure:                Pre-Anesthesia Assessment:                           - Prior to the procedure, a History and Physical                            was performed, and patient medications and                            allergies were reviewed. The patient's tolerance of                            previous anesthesia was also reviewed. The risks                            and benefits of the procedure and the sedation                            options and risks were discussed with the patient.                            All questions were answered, and informed consent                            was obtained. Prior Anticoagulants: The patient has                            taken no anticoagulant or antiplatelet agents                            except for aspirin. ASA Grade Assessment: III - A                            patient with severe systemic disease. After                            reviewing the risks and benefits, the patient was                            deemed in satisfactory condition to undergo the                            procedure.  After obtaining informed consent, the colonoscope                            was passed under direct vision. Throughout the                            procedure, the patient's blood pressure, pulse, and                            oxygen saturations were monitored continuously. The                            CF [id number] #[id number] was introduced through the anus                            and advanced to the the terminal ileum, with                             identification of the appendiceal orifice and IC                            valve. The colonoscopy was performed without                            difficulty. The patient tolerated the procedure                            well. The quality of the bowel preparation was                            good. The terminal ileum, ileocecal valve,                            appendiceal orifice, and rectum were photographed.                            The bowel preparation used was SUPREP via split                            dose instruction. Scope In: 9:45:44 AM Scope Out: 10:05:19 AM Scope Withdrawal Time: 0 hours 15 minutes 35 seconds  Total Procedure Duration: 0 hours 19 minutes 35 seconds  Findings:                 Skin tags were found on perianal exam.                           The digital rectal examination was normal.                            Pertinent negatives include normal sphincter tone                            and no palpable rectal lesions.  An 8 mm polyp was found in the cecum. The polyp was                            flat. The polyp was removed with a cold snare.                            Resection and retrieval were complete. Estimated                            blood loss was minimal.                           A 4 mm polyp was found in the transverse colon. The                            polyp was sessile. The polyp was removed with a                            piecemeal technique using a cold snare. Resection                            and retrieval were complete. Estimated blood loss                            was minimal.                           The exam was otherwise normal throughout the                            examined colon.                           Normal mucosa was found in the entire colon.                            Biopsies for histology were taken with a cold                            forceps from the ascending colon,  transverse colon,                            descending colon and sigmoid colon for evaluation                            of microscopic colitis. Estimated blood loss was                            minimal.                           The terminal ileum appeared normal.  The retroflexed view of the distal rectum and anal                            verge was normal and showed no anal or rectal                            abnormalities. Complications:            No immediate complications. Estimated Blood Loss:     Estimated blood loss was minimal. Impression:               - Perianal skin tags found on perianal exam.                           - One 8 mm polyp in the cecum, removed with a cold                            snare. Resected and retrieved.                           - One 4 mm polyp in the transverse colon, removed                            piecemeal using a cold snare. Resected and                            retrieved.                           - Normal mucosa in the entire examined colon.                            Biopsied.                           - The examined portion of the ileum was normal.                           - The distal rectum and anal verge are normal on                            retroflexion view. Recommendation:           - Patient has a contact number available for                            emergencies. The signs and symptoms of potential                            delayed complications were discussed with the                            patient. Return to normal activities tomorrow.                            Written discharge  instructions were provided to the                            patient.                           - Resume previous diet.                           - Continue present medications.                           - Await pathology results.                           - Repeat colonoscopy (date not yet determined) for                             surveillance based on pathology results.  E. Tomasa Rand, MD 09/05/2022 10:12:17 AM This report has been signed electronically.

## 2022-09-05 NOTE — Progress Notes (Signed)
Pt's states no medical or surgical changes since previsit or office visit. 

## 2022-09-05 NOTE — Progress Notes (Signed)
History and Physical Interval Note:  09/05/2022 9:35 AM  Kristie Owens  has presented today for endoscopic procedure(s), with the diagnosis of  Encounter Diagnosis  Name Primary?   Abnormal CT scan Yes  .  The various methods of evaluation and treatment have been discussed with the patient and/or family. After consideration of risks, benefits and other options for treatment, the patient has consented to  the endoscopic procedure(s).   The patient's history has been reviewed, patient examined, no change in status, stable for endoscopic procedure(s).  I have reviewed the patient's chart and labs.  Questions were answered to the patient's satisfaction.      E. Tomasa Rand, MD [facility]

## 2022-09-06 ENCOUNTER — Telehealth: Payer: Self-pay | Admitting: *Deleted

## 2022-09-06 ENCOUNTER — Ambulatory Visit (INDEPENDENT_AMBULATORY_CARE_PROVIDER_SITE_OTHER): Payer: Managed Care, Other (non HMO) | Admitting: Urology

## 2022-09-06 ENCOUNTER — Encounter: Payer: Self-pay | Admitting: Urology

## 2022-09-06 VITALS — BP 125/72 | HR 64 | Ht 63.0 in | Wt 186.0 lb

## 2022-09-06 DIAGNOSIS — N281 Cyst of kidney, acquired: Secondary | ICD-10-CM | POA: Diagnosis not present

## 2022-09-06 DIAGNOSIS — R3129 Other microscopic hematuria: Secondary | ICD-10-CM

## 2022-09-06 NOTE — Progress Notes (Signed)
Assessment: 1. Bilateral renal cysts   2. Microscopic hematuria      Plan: Today had a long discussion with the patient regarding her small likely benign indeterminate renal cyst.  Apparently her insurance did not cover her MRI which her PCP had ordered.  I have recommended that we wait 3 to 4 months prior to obtaining additional imaging.  Will obtain renal protocol MRI at that time.  Discussed in detail implications of microscopic hematuria and national guidelines for further evaluation.  I recommended cystoscopy with female exam.  Will schedule next available  Consider vaginal estrogen cream on follow-up given history of sporadic UTIs postmenopausal.  Chief Complaint: renal lesions  History of Present Illness:  Kristie Owens is a 67 y.o. female with a past medical history of coronary artery disease status post prior stenting, hypertension, history of TIA, DM-2, depression/GAD who is seen in consultation from [facility], Renee A, DO for evaluation of bilateral indeterminate renal lesions. Patient recently underwent a CT A/P with contrast for left lower quadrant abdominal discomfort and diarrhea.  It incidentally showed a 1.5 cm exophytic lesion left interpolar kidney laterally consistent with a hyperdense cyst.  There is also noted a small right lower pole hyperdense lesion as well.  Both of these are indeterminant given lack of renal protocol imaging.  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  This is stable with prior exams.  Interestingly, patient was actually seen by me approximately 20 years ago for indeterminate renal lesions that were followed with serial imaging and felt to be benign.  Of note today is the patient does have significant microscopic hematuria.  She has 3-10 RBC/hpf.  She has also had occasional UTIs over the last several years.  She  is postmenopausal.   Past Medical History:  Past Medical History:  Diagnosis Date   Achilles bursitis or tendinitis 11/11/2012   right    Allergy    Angina at rest    Dr Anne Fu   CAD (coronary artery disease), native coronary artery    DES to LAD in 2 areas, one at the bifurcation of a diagonal branch and also in the mid to distal region. Her diagonal branch was also ballooned . She has a DES to RCA 10/10. Her circumflex artery mild ostial disease.   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   Tremor 03/23/2020    Past Surgical History:  Past Surgical History:  Procedure Laterality Date   BREAST EXCISIONAL BIOPSY Right 1995   CAROTID STENT     CESAREAN SECTION  1983   FOOT SURGERY     LEFT HEART CATH AND CORONARY ANGIOGRAPHY N/A 06/04/2016   Procedure: Left Heart Cath and Coronary Angiography;  Surgeon: Lyn Records, MD;  Location: [facility] INVASIVE CV LAB;  Service: Cardiovascular;  Laterality: N/A;   LEFT HEART CATH AND CORONARY ANGIOGRAPHY N/A 12/04/2019   Procedure: LEFT HEART CATH AND CORONARY ANGIOGRAPHY;  Surgeon: Kathleene Hazel, MD;  Location: [facility] INVASIVE CV LAB;  Service: Cardiovascular;  Laterality: N/A;   LEFT HEART CATHETERIZATION WITH CORONARY ANGIOGRAM N/A 01/16/2011   Procedure: LEFT HEART CATHETERIZATION WITH CORONARY ANGIOGRAM;  Surgeon: Donato Schultz, MD;  Location: [facility] CATH LAB;  Service: Cardiovascular;  Laterality: N/A;   NOSE SURGERY     SINOSCOPY      Allergies:  Allergies  Allergen  Reactions   Bupropion Nausea Only and Other (See Comments)    Nausea and headaches    Sulfa Antibiotics Other (See Comments)    From childhood- reaction not recalled   Prednisone Anxiety and Other (See Comments)    "Made me not feel like myself, altered"    Family History:  Family History  Problem Relation Age of Onset   Hypertension Mother    Heart disease Mother    COPD Mother    Ulcers Mother    Lung cancer Father    Hypertension Sister    Diabetes Sister    Arthritis Sister    Depression Sister    Miscarriages / Stillbirths Sister    Early death Maternal Grandmother    Tuberculosis Maternal Grandmother    Lung cancer Maternal Grandfather    Suicidality Maternal Grandfather    Early death Maternal Grandfather    Heart failure Paternal Grandmother    Heart attack Paternal Grandmother    Hypertension Paternal Grandmother    Hyperlipidemia Paternal Grandmother    Heart disease Paternal Grandmother    Heart disease Paternal Grandfather    Hyperlipidemia Paternal Grandfather    Hypertension Paternal Grandfather    Hyperlipidemia Brother    Hypertension Brother    Intellectual disability Brother    Kidney disease Brother    Heart attack Brother    Asthma Brother    Early death Brother 22   Hyperlipidemia Brother    Hypertension Brother    Intellectual disability Brother    Kidney disease Brother    Heart disease Brother    Alcohol abuse Brother    Heart attack Other    Stroke Neg Hx    Breast cancer Neg Hx     Social History:  Social History   Tobacco Use   Smoking status: Never   Smokeless tobacco: Never  Vaping Use   Vaping status: Never Used  Substance Use Topics   Alcohol use: Not Currently   Drug use: Never    Review of symptoms:  Constitutional:  Negative for unexplained weight loss, night sweats, fever, chills ENT:  Negative for nose bleeds, sinus pain, painful swallowing CV:  Negative for chest pain, shortness of breath, exercise intolerance,  palpitations, loss of consciousness Resp:  Negative for cough, wheezing, shortness of breath GI:  Negative for nausea, vomiting, diarrhea, bloody stools GU:  Positives noted in HPI; otherwise negative for gross hematuria, dysuria, urinary incontinence Neuro:  Negative for seizures, poor balance, limb weakness, slurred speech Psych:  Negative for lack of energy, depression, anxiety Endocrine:  Negative for polydipsia, polyuria, symptoms of hypoglycemia (dizziness, hunger, sweating) Hematologic:  Negative for anemia, purpura, petechia, prolonged or excessive bleeding, use of anticoagulants  Allergic:  Negative for difficulty breathing or choking as a result of exposure to anything; no shellfish allergy; no allergic response (rash/itch) to materials, foods  Physical exam: BP 125/72   Pulse 64   Ht 5\' 3"  (  1.6 m)   Wt 186 lb (84.4 kg)   BMI 32.95 kg/m  GENERAL APPEARANCE:  Well appearing, well developed, well nourished, NAD    Results: UA remarkable for 3-10 RBC/hpf; negative for infection

## 2022-09-06 NOTE — Telephone Encounter (Signed)
  Follow up Call-     09/05/2022    8:47 AM  Call back number  Post procedure Call Back phone  # [phone]  Permission to leave phone message Yes     Patient questions:  Do you have a fever, pain , or abdominal swelling? No. Pain Score  0 *  Have you tolerated food without any problems? Yes.    Have you been able to return to your normal activities? Yes.    Do you have any questions about your discharge instructions: Diet   No. Medications  No. Follow up visit  No.  Do you have questions or concerns about your Care? No.  Actions: * If pain score is 4 or above: No action needed, pain <4.

## 2022-09-10 ENCOUNTER — Telehealth: Payer: Self-pay

## 2022-09-10 NOTE — Telephone Encounter (Signed)
No longer need ordered MRI.  Urology is handling

## 2022-09-10 NOTE — Telephone Encounter (Signed)
LM to return call to discuss.

## 2022-09-10 NOTE — Telephone Encounter (Signed)
Kristie Owens) called and stated per insurance, a result for the recent CT that was done would need to be faxed along with a statement about why MRI is needed for further imaging to [phone]. [employer] can be reached by phone at [phone].  Kristie Owens would like a call once this information has been faxed so that she can follow it at [phone] [id number]   If the MRI is no longer necessary, please call Kristie and let her know.

## 2022-09-11 NOTE — Progress Notes (Signed)
Ms. Piekos,  The two polyps which I removed during your recent procedure were proven to be completely benign but are considered "pre-cancerous" polyps that MAY have grown into cancer if they had not been removed.  Studies shows that at least 20% of women over age 69 and 30% of men over age 84 have pre-cancerous polyps.  Based on current nationally recognized surveillance guidelines, I recommend that you have a repeat colonoscopy in 7 years.   The biopsies taken from your colon were normal and there was no evidence of Microscopic Colitis or chronic inflammatory changes.   Please follow up as needed in the office for ongoing issues with diarrhea.

## 2022-09-13 ENCOUNTER — Ambulatory Visit: Payer: Managed Care, Other (non HMO) | Admitting: Urology

## 2022-09-13 VITALS — BP 131/64 | HR 67 | Ht 63.5 in | Wt 186.0 lb

## 2022-09-13 DIAGNOSIS — N281 Cyst of kidney, acquired: Secondary | ICD-10-CM | POA: Diagnosis not present

## 2022-09-13 DIAGNOSIS — R3129 Other microscopic hematuria: Secondary | ICD-10-CM | POA: Diagnosis not present

## 2022-09-13 LAB — URINALYSIS, ROUTINE W REFLEX MICROSCOPIC
Bilirubin, UA: NEGATIVE
Glucose, UA: NEGATIVE
Ketones, UA: NEGATIVE
Leukocytes,UA: NEGATIVE
Nitrite, UA: NEGATIVE
Protein,UA: NEGATIVE
Specific Gravity, UA: 1.015 (ref 1.005–1.030)
Urobilinogen, Ur: 0.2 mg/dL (ref 0.2–1.0)
pH, UA: 6.5 (ref 5.0–7.5)

## 2022-09-13 LAB — MICROSCOPIC EXAMINATION

## 2022-09-13 MED ORDER — ESTRADIOL 0.1 MG/GM VA CREA: TOPICAL_CREAM | VAGINAL | 3 refills | Status: AC

## 2022-09-13 MED ORDER — CIPROFLOXACIN HCL 500 MG PO TABS
500.0000 mg | ORAL_TABLET | Freq: Once | ORAL | Status: AC
  Administered 2022-09-13: 500 mg via ORAL

## 2022-09-13 NOTE — Progress Notes (Addendum)
Assessment: 1. Microscopic hematuria     Plan: FU Cx bladder detect  Discussed with patient finding of atrophic vaginitis and option for vaginal estrogen cream.  Rationale as well as nature of medication discussed in detail including potential adverse events and side effects.  She has had sporadic UTI's Following our discussion she would like to begin treatment. Rx: Estrace vaginal cream-apply 3 times weekly  Patient will follow-up in 4 months for recheck. Consider hematuria profile if microscopic hematuria persists Need to follow-up repeat imaging of indeterminate small renal lesions.  Addendum: Cx Bladder detect= normal   Chief Complaint: Blood in urine  HPI: Kristie Owens is a 67 y.o. female who presents for continued evaluation of microscopic hematuria. Please see my note dated 09/06/2022 at the time of initial visit for detailed history and exam. Patient presents here today for planned cystoscopy. She also has bilateral indeterminate small renal cystic lesions that will require follow-up imaging.  Portions of the above documentation were copied from a prior visit for review purposes only.  Allergies: Allergies  Allergen Reactions   Bupropion Nausea Only and Other (See Comments)    Nausea and headaches    Sulfa Antibiotics Other (See Comments)    From childhood- reaction not recalled   Prednisone Anxiety and Other (See Comments)    "Made me not feel like myself, altered"    PMH: Past Medical History:  Diagnosis Date   Achilles bursitis or tendinitis 11/11/2012   right    Allergy    Angina at rest    Dr Anne Fu   CAD (coronary artery disease), native coronary artery    DES to LAD in 2 areas, one at the bifurcation of a diagonal branch and also in the mid to distal region. Her diagonal branch was also ballooned . She has a DES to RCA 10/10. Her circumflex artery mild ostial disease.   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   Tremor 03/23/2020    PSH: Past Surgical History:  Procedure Laterality Date   BREAST EXCISIONAL BIOPSY Right 1995   CAROTID STENT     CESAREAN SECTION  1983   FOOT SURGERY     LEFT HEART CATH AND CORONARY ANGIOGRAPHY N/A 06/04/2016   Procedure: Left Heart Cath and Coronary Angiography;  Surgeon: Lyn Records, MD;  Location: [facility] INVASIVE CV LAB;  Service: Cardiovascular;  Laterality: N/A;   LEFT HEART CATH AND CORONARY ANGIOGRAPHY N/A 12/04/2019   Procedure: LEFT HEART CATH AND CORONARY ANGIOGRAPHY;  Surgeon: Kathleene Hazel, MD;  Location: [facility] INVASIVE CV LAB;  Service: Cardiovascular;  Laterality: N/A;   LEFT HEART CATHETERIZATION WITH CORONARY ANGIOGRAM N/A 01/16/2011   Procedure: LEFT HEART CATHETERIZATION WITH CORONARY ANGIOGRAM;  Surgeon: Donato Schultz, MD;  Location: [facility] CATH LAB;  Service: Cardiovascular;  Laterality: N/A;   NOSE SURGERY     SINOSCOPY      SH: Social  History   Tobacco Use   Smoking status: Never   Smokeless tobacco: Never  Vaping Use   Vaping status: Never Used  Substance Use Topics   Alcohol use: Not Currently   Drug use: Never    ROS: Constitutional:  Negative for fever, chills, weight loss CV: Negative for chest pain, previous MI, hypertension Respiratory:  Negative for shortness of breath, wheezing, sleep apnea, frequent cough GI:  Negative for nausea, vomiting, bloody stool, GERD  PE: BP 131/64   Pulse 67   Ht 5' 3.5" (1.613 m)   Wt 186 lb (84.4 kg)   BMI 32.43 kg/m  GENERAL APPEARANCE:  Well  appearing, well developed, well nourished, NAD HEENT:  Atraumatic, normocephalic, oropharynx clear NECK:  Supple without lymphadenopathy or thyromegaly ABDOMEN:  Soft, non-tender, no masses EXTREMITIES:  Moves all extremities well, without clubbing, cyanosis, or edema NEUROLOGIC:  Alert and oriented x 3, normal gait, CN II-XII grossly intact MENTAL STATUS:  appropriate BACK:  Non-tender to palpation, No CVAT SKIN:  Warm, dry, and intact   Procedure: Cystourethroscopy   Indication: Microscopic hematuria  Description of procedure: Patient was brought to the procedure room where she was correctly identified.  Procedure was again discussed with the patient and informed consent was obtained.  The patient was then positioned in the modified dorsolithotomy position and prepped and draped in the usual fashion. Preprocedural timeout was performed.  On vaginal exam the patient has prominent atrophic vaginitis  Flexible cystoscopy was then performed.  The bladder was systematically examined.  Ureteral openings appeared normal.  There were no focal mucosal lesions.  The bladder appeared normal.  Procedure was well-tolerated/no complications

## 2022-09-13 NOTE — Addendum Note (Signed)
Addended by: Malva Cogan L on: 09/13/2022 11:50 AM   Modules accepted: Orders

## 2022-09-17 ENCOUNTER — Other Ambulatory Visit: Payer: Self-pay | Admitting: Family Medicine

## 2022-09-17 NOTE — Telephone Encounter (Signed)
Last refilled during 7/10 appt  Please fill, if appropriate.

## 2022-09-19 NOTE — Telephone Encounter (Signed)
Patient calling to discuss referral for MRI.  Patient wants to discuss why MRI no longer needed from urology office.  Please call [phone]

## 2022-09-20 ENCOUNTER — Encounter: Payer: Self-pay | Admitting: Urology

## 2022-09-20 NOTE — Telephone Encounter (Signed)
LM for pt to return call to discuss.  MRI not needed because urology is not monitoring and treating pt for original complaint.

## 2022-09-21 NOTE — Telephone Encounter (Signed)
Patient returning Vanessa's call from this morning regarding MRI referral.  I told patient she can return her call on Monday 8/26, that she and Dr. Claiborne Billings are out of office at the moment and will return on Monday.  She said that was absolutely fine.  [name] Noe, please call patient [phone]

## 2022-09-25 IMAGING — MG MM DIGITAL SCREENING BILAT W/ TOMO AND CAD
8 series · 8 of 24 positions shown · non-contrast
Comparison: Previous exam(s).

CLINICAL DATA: Screening.

EXAM:
DIGITAL SCREENING BILATERAL MAMMOGRAM WITH TOMOSYNTHESIS AND CAD
TECHNIQUE: Bilateral screening digital craniocaudal and mediolateral oblique
mammograms were obtained. Bilateral screening digital breast
tomosynthesis was performed. The images were evaluated with
computer-aided detection.

[L CC synth-2D]
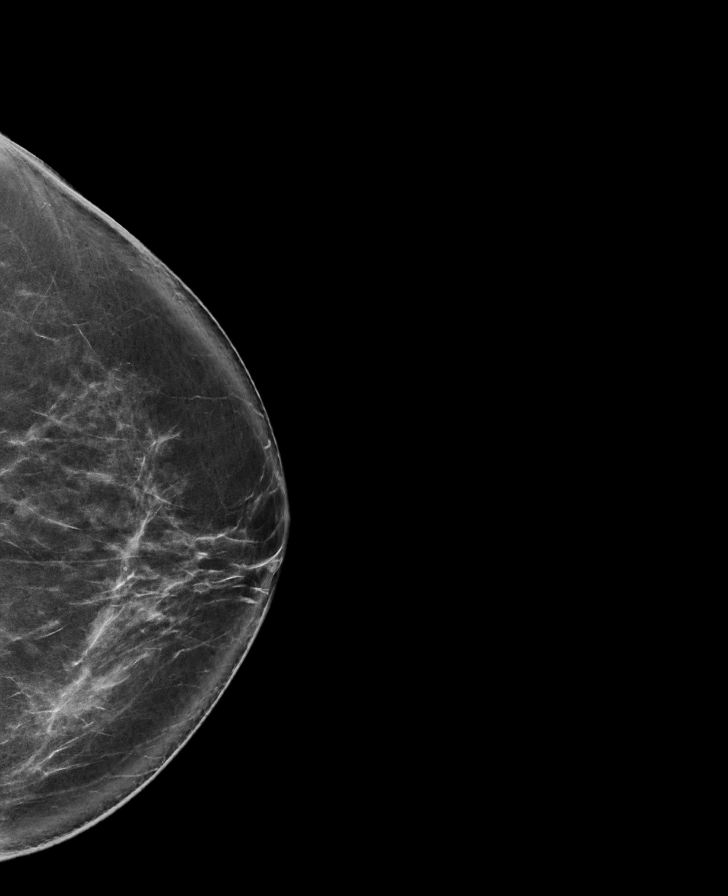

[R MLO synth-2D]
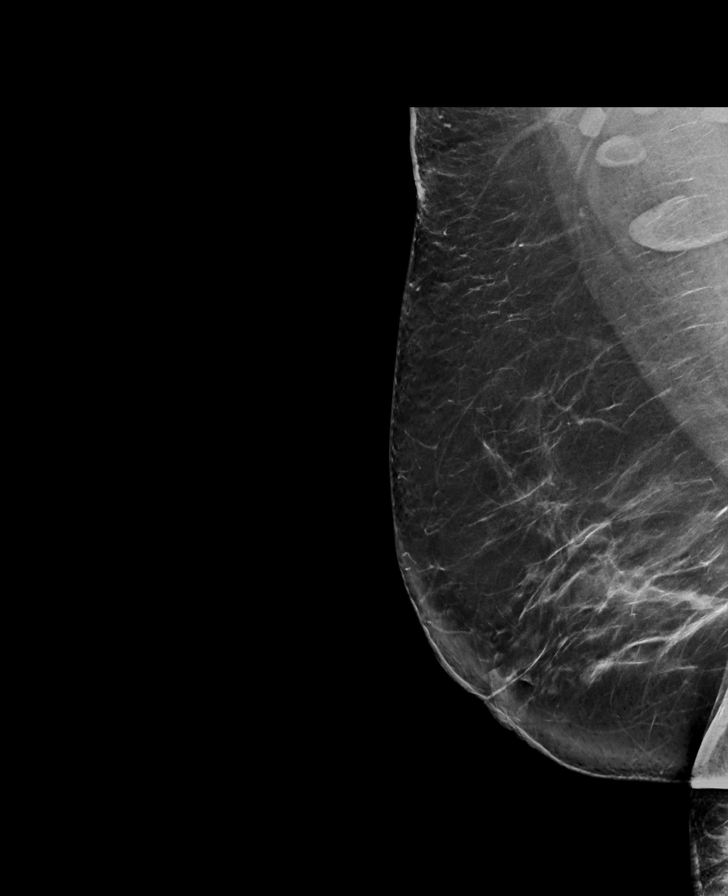

[R CC synth-2D]
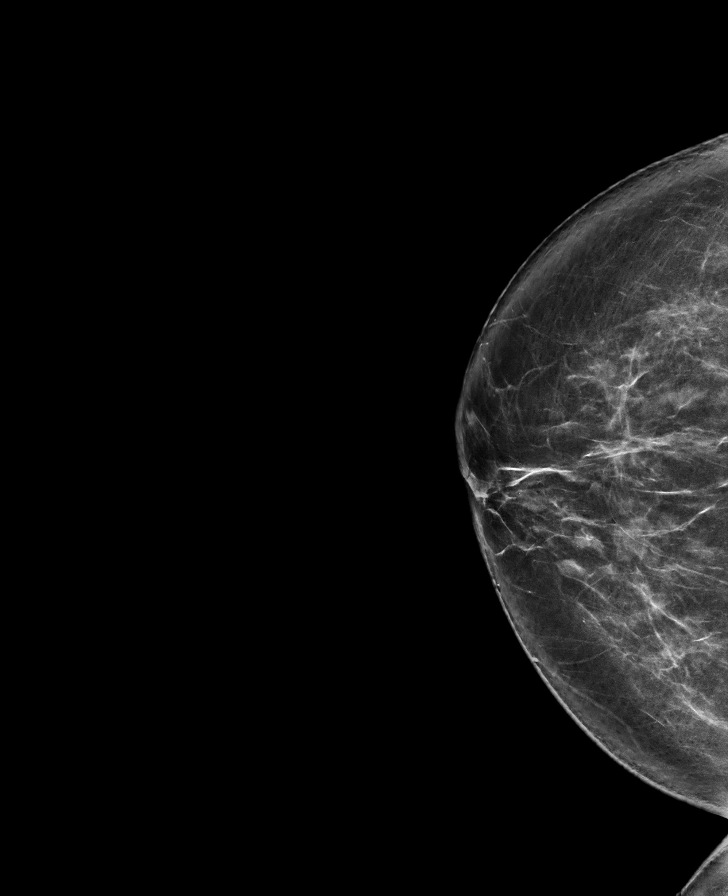

[L MLO synth-2D]
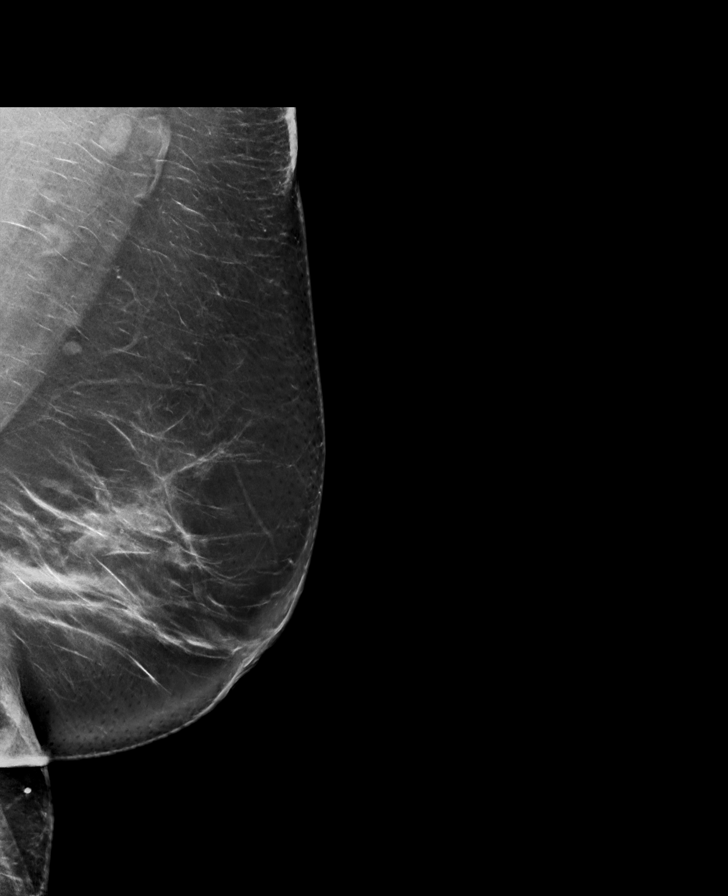

[R MLO tomo · tomo slice 51/100.0]
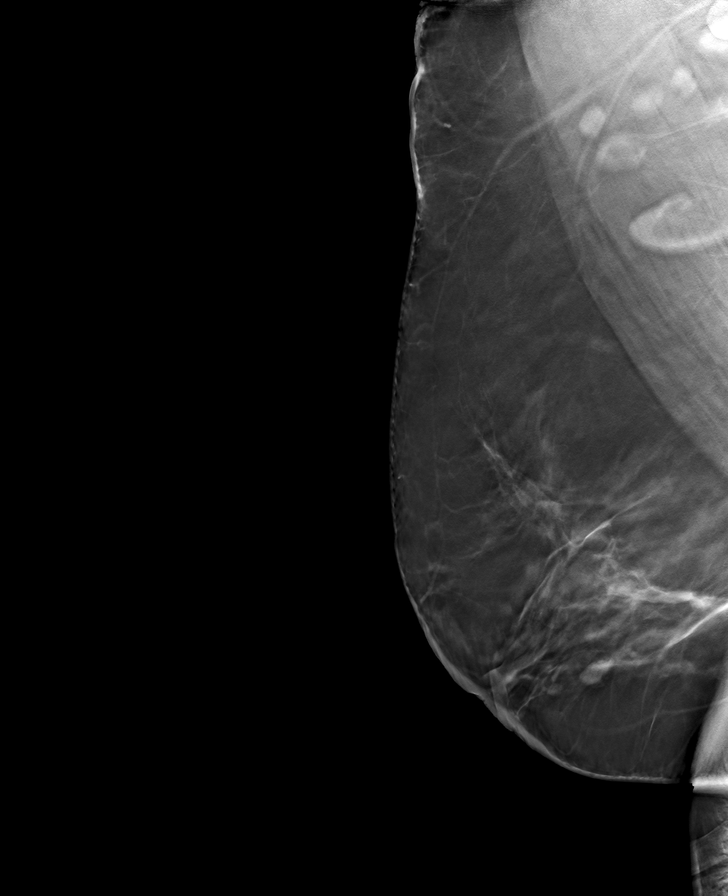

[L MLO tomo · tomo slice 51/101.0]
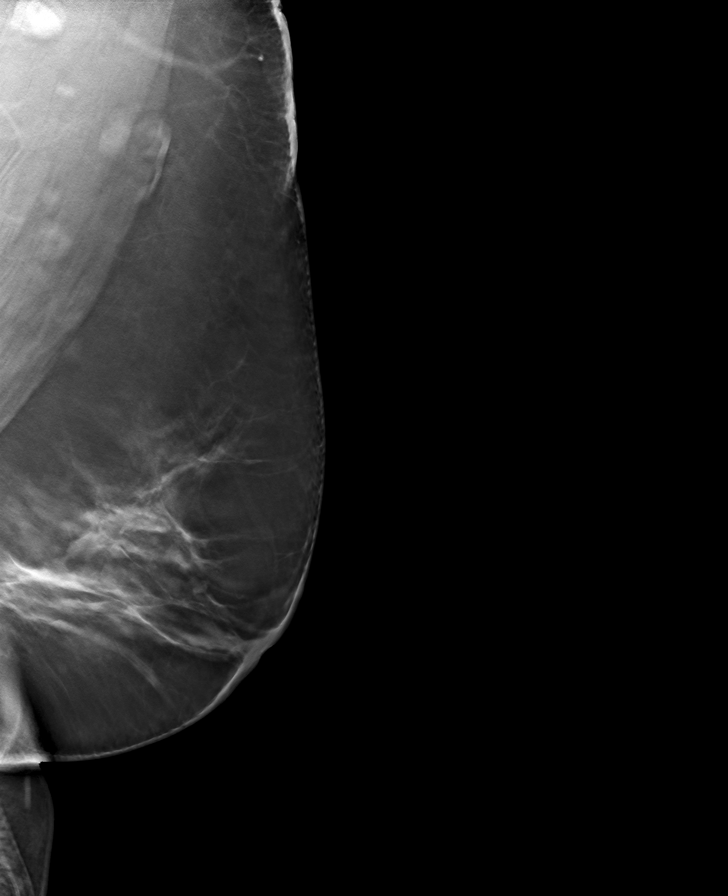

[L CC tomo · tomo slice 47/93.0]
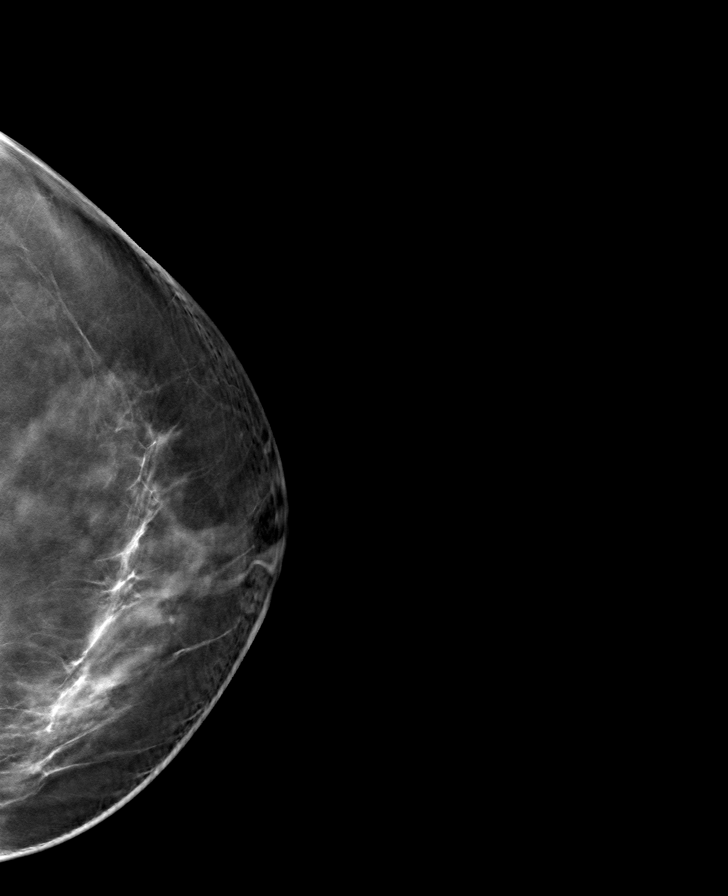

[R CC tomo · tomo slice 41/81.0]
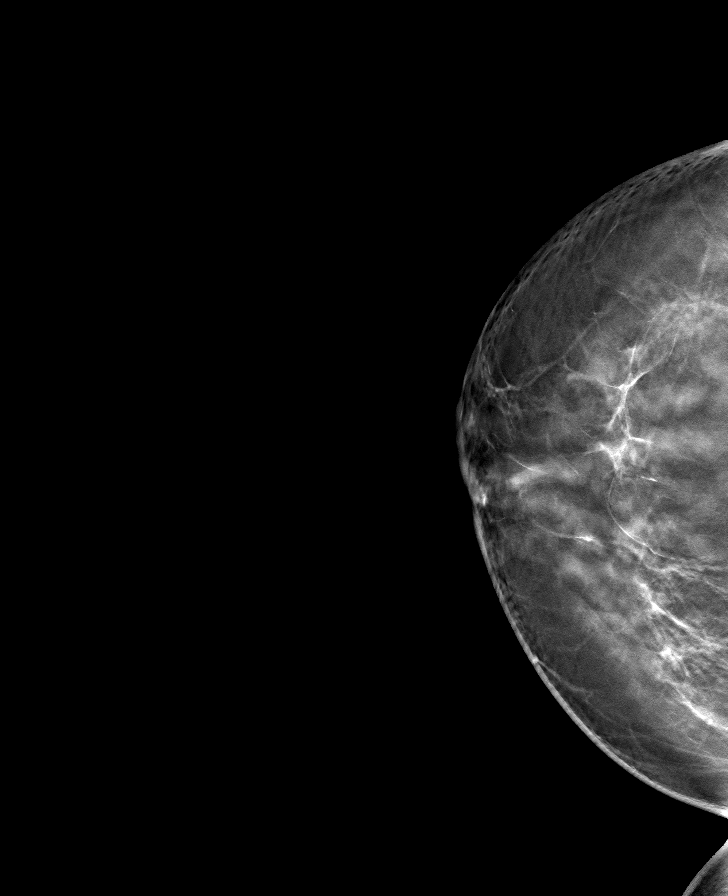

[8 of 24 positions shown; findings below may reference images not displayed]

ACR Breast Density Category b: There are scattered areas of
fibroglandular density.
FINDINGS: There are no findings suspicious for malignancy. The images were
evaluated with computer-aided detection.
IMPRESSION: No mammographic evidence of malignancy. A result letter of this
screening mammogram will be mailed directly to the patient.

RECOMMENDATION:
Screening mammogram in one year. (Code:WJ-[id number])

BI-RADS CATEGORY  1: Negative.

## 2022-09-25 NOTE — Telephone Encounter (Signed)
To my knowledge insurance denied MRI.    - If all they needed was the CT result, it is in the epic system and should be sent to them. The statement on why an MRI is needed, is reported in the abnormal  CT results. If this clears up matters, then original order should be good and she can have completed if insurance states she can, unless she wants to pay out of pocket for image (which is expensive).

## 2022-09-26 NOTE — Telephone Encounter (Signed)
Faxed

## 2022-10-03 ENCOUNTER — Telehealth: Payer: Self-pay | Admitting: Family Medicine

## 2022-10-03 NOTE — Telephone Encounter (Signed)
Patient would like Dr. Blenda Bridegroom assistant to give her a call to discuss a MRI she needs to have scheduled. Please give Kristie Owens a call.

## 2022-10-03 NOTE — Telephone Encounter (Signed)
Spoke with patient regarding results/recommendations.  

## 2022-10-04 ENCOUNTER — Telehealth: Payer: Self-pay | Admitting: Family Medicine

## 2022-10-04 NOTE — Telephone Encounter (Signed)
[name] nurse case manager with [employer] called and stated she was returning Vanessa's call. She was unsure what it was regarding with Gavin Pound. Please give her call back at [phone] ext [phone]

## 2022-10-05 NOTE — Telephone Encounter (Signed)
LM for Kristie Owens to return call to discuss.  Please ask for fax number to send info for MRI PA to be completed

## 2022-10-11 ENCOUNTER — Other Ambulatory Visit: Payer: Self-pay | Admitting: Family Medicine

## 2022-10-11 DIAGNOSIS — N2889 Other specified disorders of kidney and ureter: Secondary | ICD-10-CM

## 2022-10-11 NOTE — Telephone Encounter (Signed)
Patient called back about the referral regarding her MRI. I informed her that it has been entered, however she wanted to discuss the referral in detail, and ask that a CMA return her call.

## 2022-10-12 NOTE — Telephone Encounter (Signed)
Faxed

## 2022-10-17 ENCOUNTER — Encounter: Payer: Self-pay | Admitting: Family Medicine

## 2022-10-22 NOTE — Telephone Encounter (Signed)
After speaking with [employer], Information has been refaxed for reconsideration. Original faxes were not attached to request. 1-3 day process for reconsideration. Will follow up

## 2022-10-30 ENCOUNTER — Encounter: Payer: Self-pay | Admitting: Family Medicine

## 2022-10-30 NOTE — Telephone Encounter (Signed)
Called [name] to get update

## 2022-10-31 NOTE — Telephone Encounter (Signed)
Spoke with pt and advise her to call imaging for scheduling. Pt believes we scheduled her first imaging and wants [place] to schedule for her. I advised her that we do not schedule appointments outside of our office because it could be a conflict with pt's life schedule. Pt is not happy about situation but will call for scheduling.

## 2022-10-31 NOTE — Telephone Encounter (Signed)
LVM for Kristie Owens to let her know that we got approval per pt

## 2022-10-31 NOTE — Telephone Encounter (Signed)
[name] from [facility] returning [employer] call. Please call her back if she does not answer, please leave detailed message.  [phone] x [phone]

## 2022-11-05 NOTE — Telephone Encounter (Signed)
[name] called stating [facility] needed order faxed. Faxed order to [phone]

## 2022-11-06 ENCOUNTER — Other Ambulatory Visit: Payer: Self-pay | Admitting: Family Medicine

## 2022-11-06 DIAGNOSIS — N2889 Other specified disorders of kidney and ureter: Secondary | ICD-10-CM

## 2022-11-12 NOTE — Telephone Encounter (Signed)
FYI  Pt is scheduled for 11/20/22

## 2022-11-15 ENCOUNTER — Telehealth: Payer: Self-pay | Admitting: Family Medicine

## 2022-11-15 NOTE — Telephone Encounter (Signed)
Attempted to contact pt. Pt is scheduled for 10/22

## 2022-11-15 NOTE — Telephone Encounter (Signed)
Spoke with patient and LVM for [name] to call back to discuss

## 2022-11-15 NOTE — Telephone Encounter (Signed)
Synethia with [facility] called to report that Kristie Owens has denied the MRI due to her having a Ct scan of the abdominal. Please give Synethia a call at [phone] ext [phone]

## 2022-11-16 ENCOUNTER — Other Ambulatory Visit: Payer: Self-pay | Admitting: Family Medicine

## 2022-11-20 ENCOUNTER — Ambulatory Visit
Admission: RE | Admit: 2022-11-20 | Discharge: 2022-11-20 | Disposition: A | Discharge status: Home / Self Care | Payer: Managed Care, Other (non HMO) | Source: Ambulatory Visit | Attending: Family Medicine | Admitting: Family Medicine

## 2022-11-20 DIAGNOSIS — N2889 Other specified disorders of kidney and ureter: Secondary | ICD-10-CM

## 2022-11-20 MED ORDER — GADOPICLENOL 0.5 MMOL/ML IV SOLN
9.0000 mL | Freq: Once | INTRAVENOUS | Status: AC | PRN
  Administered 2022-11-20: 9 mL via INTRAVENOUS

## 2022-11-22 ENCOUNTER — Ambulatory Visit: Payer: Managed Care, Other (non HMO) | Admitting: Physician Assistant

## 2022-11-28 ENCOUNTER — Telehealth: Payer: Self-pay | Admitting: Family Medicine

## 2022-11-28 DIAGNOSIS — N289 Disorder of kidney and ureter, unspecified: Secondary | ICD-10-CM

## 2022-11-28 NOTE — Telephone Encounter (Signed)
Please inform patient The MRI completed to better visualized the left renal cyst has resulted and showed numerous cyst present in the midportion of the left kidney and the bottom portion of her right kidney.  None of these cysts appear to be worrisome and are considered "benign.  " No further imaging follow-up is recommended or required.  This is good news.

## 2022-11-29 NOTE — Telephone Encounter (Addendum)
Pt was advised of provider instructions. She wanted to discuss why CT showed cysts but MRI did not have it on there.   FYI: She requested to speak with Dr.Kuneff or have Dr.Kuneff call her. Advised patient to discuss with provider during a scheduled appt. Scheduled for Monday

## 2022-11-29 NOTE — Telephone Encounter (Signed)
Response to patient's question about the appendix and ovarian cyst findings on her CT abdomen in comparison to the MRI.  The MRI did not mention either the above findings as being present.

## 2022-11-30 NOTE — Telephone Encounter (Signed)
Noted, thanks!

## 2022-12-02 ENCOUNTER — Other Ambulatory Visit: Payer: Self-pay | Admitting: Family Medicine

## 2022-12-03 ENCOUNTER — Ambulatory Visit: Payer: Managed Care, Other (non HMO) | Admitting: Family Medicine

## 2022-12-03 ENCOUNTER — Encounter: Payer: Self-pay | Admitting: Family Medicine

## 2022-12-03 VITALS — BP 118/62 | HR 74 | Temp 99.0°F | Wt 196.0 lb

## 2022-12-03 DIAGNOSIS — N289 Disorder of kidney and ureter, unspecified: Secondary | ICD-10-CM

## 2022-12-03 NOTE — Patient Instructions (Addendum)

## 2022-12-03 NOTE — Progress Notes (Signed)
Patient ID: Kristie Owens, female  DOB: 1955-02-08, 67 y.o.   MRN: [id number] Patient Care Team    Relationship Specialty Notifications Start End  Natalia Leatherwood, DO PCP - General Family Medicine  06/09/19   Jake Bathe, MD PCP - Cardiology Cardiology  12/17/19   Jake Bathe, MD Consulting Physician Cardiology  06/08/19   Jill Side, OD Referring Physician   06/11/19   Moody Bruins Physician Assistant Chiropractic Medicine  06/11/19   Jill Side, OD Referring Physician   03/13/21     Chief Complaint  Patient presents with   MRI Results    Subjective: Kristie Owens is a 67 y.o.  Female  present for chronic condition manage All past medical history, surgical history, allergies, family history, immunizations, medications and social history were updated in the electronic medical record today. All recent labs, ED visits and hospitalizations within the last year were reviewed. Reviewed MRI 11/20/2022 comparison to CT imaging 07/2022.  MRI was indicated and ordered to follow-up on the visualized kidney cysts.  MRI of kidneys was reassuring and patient reports understanding. Patient has concerns over the CT showing inflammation surrounding the appendix and an ovarian cyst.  Yet, the MRI does not mention either. "Abnormally thickened appearance of the distal appendix up to 1 cm in diameter." "Reproductive: Enlarging but simple appearing left retroperitoneal cyst along the upper margin of the left adnexa but not abutting the ovary, currently this unilocular cystic lesion"  Presents today to discuss this.     08/08/2022   11:23 AM 03/12/2022    8:04 AM 08/28/2021    9:04 AM 03/13/2021   11:15 AM 11/21/2020    9:06 AM  Depression screen PHQ 2/9  Decreased Interest 0 0 0 0 0  Down, Depressed, Hopeless 0 0 0 0 0  PHQ - 2 Score 0 0 0 0 0  Altered sleeping  3 2 3 3   Tired, decreased energy  1 1 1 1   Change in appetite  0 0 2 0  Feeling bad or failure about yourself    0 0 0 0  Trouble concentrating  0 0 0 0  Moving slowly or fidgety/restless  0 0 0 0  Suicidal thoughts  0 0 0 0  PHQ-9 Score  4 3 6 4       03/12/2022    8:05 AM 08/28/2021    9:04 AM 03/13/2021   11:15 AM 11/21/2020    9:06 AM  GAD 7 : Generalized Anxiety Score  Nervous, Anxious, on Edge 0 0 0 0  Control/stop worrying 0 0 0 0  Worry too much - different things 0 0 0 0  Trouble relaxing 0 1 1 0  Restless 0 0 1 0  Easily annoyed or irritable 0 0 0 0  Afraid - awful might happen 0 0 0 0  Total GAD 7 Score 0 1 2 0    Immunization History  Administered Date(s) Administered   Fluad Quad(high Dose 65+) 10/19/2019, 11/21/2020, 12/27/2021   Fluad Trivalent(High Dose 65+) 11/12/2022   Influenza Split 12/28/2010, 11/22/2011, 11/13/2012   Influenza, High Dose Seasonal PF 11/21/2018   Influenza,inj,Quad PF,6+ Mos 12/15/2013, 12/13/2016, 11/21/2017   Influenza,inj,quad, With Preservative 09/19/2015   Influenza-Unspecified 11/21/2018   Moderna Covid-19 Vaccine Bivalent Booster 51yrs & up 03/27/2021   Moderna Sars-Covid-2 Vaccination 02/19/2019, 03/27/2019, 12/19/2019   Pneumococcal Conjugate-13 11/21/2017   Pneumococcal Polysaccharide-23 11/25/2018   Tdap 08/10/2013   Zoster  Recombinant(Shingrix) 12/30/2019, 11/21/2020   Past Medical History:  Diagnosis Date   Achilles bursitis or tendinitis 11/11/2012   right    Allergy    Angina at rest [facility])    Dr Anne Fu   CAD (coronary artery disease), native coronary artery    DES to LAD in 2 areas, one at the bifurcation of a diagonal branch and also in the mid to distal region. Her diagonal branch was also ballooned . She has a DES to RCA 10/10. Her circumflex artery mild ostial disease.   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   Tremor 03/23/2020   Allergies  Allergen Reactions   Bupropion Nausea Only and Other (See Comments)    Nausea and headaches    Sulfa Antibiotics Other (See Comments)    From childhood- reaction not recalled   Prednisone Anxiety and Other (See Comments)    "Made me not feel like myself, altered"   Past Surgical History:  Procedure Laterality Date   BREAST EXCISIONAL BIOPSY Right 1995   CAROTID STENT     CESAREAN SECTION  1983   FOOT SURGERY     LEFT HEART CATH AND CORONARY ANGIOGRAPHY N/A 06/04/2016   Procedure: Left Heart Cath and Coronary Angiography;  Surgeon: Lyn Records, MD;  [facility] INVASIVE CV LAB;  Service: Cardiovascular;  Laterality: N/A;   LEFT HEART CATH AND CORONARY ANGIOGRAPHY N/A 12/04/2019   Procedure: LEFT HEART CATH AND CORONARY ANGIOGRAPHY;  Surgeon: Kathleene Hazel, MD;  Location: [facility] INVASIVE CV LAB;  Service: Cardiovascular;  Laterality: N/A;   LEFT HEART CATHETERIZATION WITH CORONARY ANGIOGRAM N/A 01/16/2011   Procedure: LEFT HEART CATHETERIZATION WITH CORONARY ANGIOGRAM;  Surgeon: Donato Schultz, MD;  Location: [facility] CATH LAB;  Service: Cardiovascular;  Laterality: N/A;   NOSE SURGERY     SINOSCOPY     Family History  Problem Relation Age of Onset   Hypertension Mother    Heart disease Mother    COPD Mother    Ulcers Mother    Lung cancer Father    Hypertension Sister    Diabetes Sister    Arthritis Sister    Depression Sister    Miscarriages / [place] Sister    Early death Maternal Grandmother    Tuberculosis Maternal Grandmother    Lung cancer Maternal Grandfather    Suicidality Maternal Grandfather    Early death Maternal Grandfather    Heart failure Paternal Grandmother     Heart attack Paternal Grandmother    Hypertension Paternal Grandmother    Hyperlipidemia Paternal Grandmother    Heart disease Paternal Grandmother    Heart disease Paternal Grandfather    Hyperlipidemia Paternal Grandfather    Hypertension Paternal Grandfather    Hyperlipidemia Brother    Hypertension Brother    Intellectual disability Brother    Kidney disease Brother    Heart attack Brother    Asthma Brother  Early death Brother 9   Hyperlipidemia Brother    Hypertension Brother    Intellectual disability Brother    Kidney disease Brother    Heart disease Brother    Alcohol abuse Brother    Heart attack Other    Stroke Neg Hx    Breast cancer Neg Hx    Social History   Social History Narrative   Marital status/children/pets: Married.  One child.   Education/employment: Retired.  Associates degree in college.   Safety:      -Wears a bicycle helmet riding a bike: Yes     -smoke alarm in the home:Yes     - wears seatbelt: Yes     - Feels safe in their relationships: Yes    Allergies as of 12/03/2022       Reactions   Bupropion Nausea Only, Other (See Comments)   Nausea and headaches    Sulfa Antibiotics Other (See Comments)   From childhood- reaction not recalled   Prednisone Anxiety, Other (See Comments)   "Made me not feel like myself, altered"        Medication List        Accurate as of December 03, 2022  3:24 PM. If you have any questions, ask your nurse or doctor.          amLODipine 5 MG tablet Commonly known as: NORVASC Take 1 tablet (5 mg total) by mouth daily.   aspirin EC 81 MG tablet Take 81 mg by mouth daily.   budesonide 0.5 MG/2ML nebulizer solution Commonly known as: Pulmicort Use twice a day in a sinus rinse bottle.   carvedilol 6.25 MG tablet Commonly known as: COREG Take 1 tablet (6.25 mg total) by mouth 2 (two) times daily with a meal.   cetirizine 10 MG tablet Commonly known as: ZyrTEC Allergy Take 1 tablet (10 mg  total) by mouth daily.   cholestyramine 4 g packet Commonly known as: QUESTRAN TAKE 1 PACKET (4 G TOTAL) MIXED AS DIRECETD BY MOUTH 3 (THREE) TIMES DAILY WITH MEALS. What changed: See the new instructions.   cromolyn 4 % ophthalmic solution Commonly known as: OPTICROM Place 1 drop into both eyes 4 (four) times daily as needed (itchy/watery eyes).   dicyclomine 10 MG capsule Commonly known as: BENTYL TAKE 1 CAPSULE (10 MG TOTAL) BY MOUTH 4 TIMES A DAY BEFORE MEALS AND AT BEDTIME   estradiol 0.1 MG/GM vaginal cream Commonly known as: ESTRACE Apply a pea-sized amount to finger tip and rub in vaginal and urethral area 3x weekly   isosorbide mononitrate 60 MG 24 hr tablet Commonly known as: IMDUR Take 60 mg by mouth daily.   lisinopril-hydrochlorothiazide 20-12.5 MG tablet Commonly known as: ZESTORETIC Take 1 tablet by mouth daily.   meloxicam 15 MG tablet Commonly known as: MOBIC Take 1 tablet (15 mg total) by mouth daily.   montelukast 10 MG tablet Commonly known as: SINGULAIR TAKE 1 TABLET BY MOUTH EVERYDAY AT BEDTIME   nitroGLYCERIN 0.4 MG SL tablet Commonly known as: NITROSTAT Place 1 tablet (0.4 mg total) under the tongue every 5 (five) minutes as needed for chest pain.   omeprazole 20 MG tablet Commonly known as: PRILOSEC OTC Take 20 mg by mouth daily as needed (acid reflux).   rosuvastatin 20 MG tablet Commonly known as: CRESTOR Take 1 tablet (20 mg total) by mouth daily.   sertraline 100 MG tablet Commonly known as: ZOLOFT Take 1 tablet (100 mg total) by mouth at bedtime.  tirzepatide 5 MG/0.5ML Pen Commonly known as: MOUNJARO Inject 5 mg into the skin once a week.   Vitamin D (Ergocalciferol) 1.25 MG (50000 UNIT) Caps capsule Commonly known as: DRISDOL Take 1 capsule (50,000 Units total) by mouth every 7 (seven) days.        All past medical history, surgical history, allergies, family history, immunizations andmedications were updated in the EMR  today and reviewed under the history and medication portions of their EMR.       ROS 14 pt review of systems performed and negative (unless mentioned in an HPI)  Objective: BP 118/62   Pulse 74   Temp 99 F (37.2 C)   Wt 196 lb (88.9 kg)   SpO2 97%   BMI 34.18 kg/m  Physical Exam Vitals and nursing note reviewed.  Constitutional:      General: She is not in acute distress.    Appearance: Normal appearance. She is normal weight. She is not ill-appearing or toxic-appearing.  HENT:     Head: Normocephalic and atraumatic.  Eyes:     General: No scleral icterus.       Right eye: No discharge.        Left eye: No discharge.     Extraocular Movements: Extraocular movements intact.     Conjunctiva/sclera: Conjunctivae normal.     Pupils: Pupils are equal, round, and reactive to light.  Skin:    Findings: No rash.  Neurological:     Mental Status: She is alert and oriented to person, place, and time. Mental status is at baseline.     Motor: No weakness.     Coordination: Coordination normal.     Gait: Gait normal.  Psychiatric:        Mood and Affect: Mood normal.        Behavior: Behavior normal.        Thought Content: Thought content normal.        Judgment: Judgment normal.      No results found.  Assessment/plan: ALEYNA CUEVA is a 67 y.o. female present for  Lesion of native kidney bilateral-benign cysts MRI reassuring left kidney lesions. If radiologist did not note the appendix specifically, it was because inflammation was not present to visualize.  Original CT abdomen was completed at a time when she was having extensive diarrhea, and possibly had inflamed appendix at that time.  It is reassuring that she needs no longer has those symptoms and MRI does not mention any acute concern with her appendix.  Patient appreciated explanation. Ovarian cyst still appears to be present and benign from prior visualization.   Return if symptoms worsen or fail to  improve.  No orders of the defined types were placed in this encounter.  No orders of the defined types were placed in this encounter.  Referral Orders  No referral(s) requested today     Electronically signed by: Felix Pacini, DO [facility]

## 2022-12-11 LAB — HM DIABETES EYE EXAM

## 2023-01-16 ENCOUNTER — Ambulatory Visit (INDEPENDENT_AMBULATORY_CARE_PROVIDER_SITE_OTHER): Payer: Managed Care, Other (non HMO) | Admitting: Urology

## 2023-01-16 ENCOUNTER — Encounter: Payer: Self-pay | Admitting: Urology

## 2023-01-16 VITALS — BP 102/63 | HR 72 | Ht 64.0 in | Wt 191.0 lb

## 2023-01-16 DIAGNOSIS — N952 Postmenopausal atrophic vaginitis: Secondary | ICD-10-CM | POA: Diagnosis not present

## 2023-01-16 DIAGNOSIS — R3129 Other microscopic hematuria: Secondary | ICD-10-CM

## 2023-01-16 LAB — URINALYSIS, ROUTINE W REFLEX MICROSCOPIC
Bilirubin, UA: NEGATIVE
Glucose, UA: NEGATIVE
Ketones, UA: NEGATIVE
Leukocytes,UA: NEGATIVE
Nitrite, UA: NEGATIVE
Protein,UA: NEGATIVE
Specific Gravity, UA: 1.01 (ref 1.005–1.030)
Urobilinogen, Ur: 0.2 mg/dL (ref 0.2–1.0)
pH, UA: 5.5 (ref 5.0–7.5)

## 2023-01-16 LAB — MICROSCOPIC EXAMINATION

## 2023-01-16 NOTE — Progress Notes (Signed)
Assessment: 1. Microscopic hematuria   2. Postmenopausal atrophic vaginitis     Plan: Microscopic hematuria is now resolved.  Negative evaluation. Continue vaginal estrogen cream Follow-up as needed  Chief Complaint: Blood in urine  HPI: Kristie Owens is a 67 y.o. female who presents for continued evaluation of microscopic hematuria.  Please see my note dated 09/06/2018 for detail of initial visit for detailed history and exam.  Hematuria evaluation-- 07/2022 CT A/P with contrast--negative GU except for indeterminate left renal cyst.  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 Patient also noted to have an adnexal retroperitoneal cyst with recommendation for further surveillance with ultrasound.  Cystoscopy 08/2022 negative; prominent atrophic vaginitis; patient started on vaginal estrogen cream  CX bladder detect negative  MRI abdomen 10/2022 demonstrated that the cyst were uncomplicated Bosniak 1 and 2 without further imaging requirements.  Follow-up 01/16/2023--no GU complaints.  Urinalysis is negative for significant blood or infection.     Portions of the above documentation were copied from a prior visit for review purposes only.  Allergies: Allergies  Allergen Reactions   Bupropion Nausea Only and Other (See Comments)    Nausea and headaches    Sulfa Antibiotics Other (See Comments)    From childhood- reaction not recalled   Prednisone Anxiety and Other (See Comments)    "Made me not feel like myself, altered"    PMH: Past Medical History:  Diagnosis Date   Achilles bursitis or tendinitis 11/11/2012   right    Allergy    Angina at rest [facility])    Dr Anne Fu   CAD (coronary artery disease), native coronary artery    DES to LAD in 2 areas, one at the bifurcation of a diagonal branch and also in the mid to distal region. Her diagonal branch was also ballooned . She has a DES to RCA 10/10.  Her circumflex artery mild ostial disease.   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   Tremor 03/23/2020    PSH: Past Surgical History:  Procedure Laterality Date   BREAST EXCISIONAL BIOPSY Right 1995   CAROTID STENT     CESAREAN SECTION  1983   FOOT SURGERY     LEFT HEART CATH AND CORONARY ANGIOGRAPHY N/A 06/04/2016   Procedure: Left Heart Cath and Coronary Angiography;  Surgeon: Lyn Records, MD;  Location: [facility] INVASIVE CV LAB;  Service: Cardiovascular;  Laterality: N/A;   LEFT HEART CATH AND CORONARY ANGIOGRAPHY N/A 12/04/2019   Procedure: LEFT HEART CATH AND CORONARY ANGIOGRAPHY;  Surgeon: Kathleene Hazel, MD;  Location: [facility] INVASIVE CV LAB;  Service: Cardiovascular;  Laterality: N/A;   LEFT HEART CATHETERIZATION WITH CORONARY ANGIOGRAM N/A 01/16/2011   Procedure: LEFT HEART CATHETERIZATION WITH CORONARY ANGIOGRAM;  Surgeon: Donato Schultz, MD;  Location: [facility] CATH LAB;  Service: Cardiovascular;  Laterality: N/A;   NOSE SURGERY  SINOSCOPY      SH: Social History   Tobacco Use   Smoking status: Never   Smokeless tobacco: Never  Vaping Use   Vaping status: Never Used  Substance Use Topics   Alcohol use: Not Currently   Drug use: Never    ROS: Constitutional:  Negative for fever, chills, weight loss CV: Negative for chest pain, previous MI, hypertension Respiratory:  Negative for shortness of breath, wheezing, sleep apnea, frequent cough GI:  Negative for nausea, vomiting, bloody stool,  GERD  PE: BP 102/63   Pulse 72   Ht 5\' 4"  (1.626 m)   Wt 191 lb (86.6 kg)   BMI 32.79 kg/m  GENERAL APPEARANCE:  Well appearing, well developed, well nourished, NAD    Results: UA negative for blood or infection

## 2023-02-28 ENCOUNTER — Ambulatory Visit: Payer: Managed Care, Other (non HMO) | Admitting: Physician Assistant

## 2023-03-03 ENCOUNTER — Other Ambulatory Visit: Payer: Self-pay | Admitting: Cardiology

## 2023-03-03 ENCOUNTER — Other Ambulatory Visit: Payer: Self-pay | Admitting: Family Medicine

## 2023-03-18 ENCOUNTER — Ambulatory Visit: Payer: Managed Care, Other (non HMO) | Admitting: Family Medicine

## 2023-03-18 ENCOUNTER — Encounter: Payer: Self-pay | Admitting: Family Medicine

## 2023-03-18 VITALS — BP 112/78 | HR 72 | Temp 98.6°F | Ht 64.0 in | Wt 190.4 lb

## 2023-03-18 DIAGNOSIS — F3340 Major depressive disorder, recurrent, in remission, unspecified: Secondary | ICD-10-CM

## 2023-03-18 DIAGNOSIS — N838 Other noninflammatory disorders of ovary, fallopian tube and broad ligament: Secondary | ICD-10-CM

## 2023-03-18 DIAGNOSIS — E559 Vitamin D deficiency, unspecified: Secondary | ICD-10-CM

## 2023-03-18 DIAGNOSIS — I700 Atherosclerosis of aorta: Secondary | ICD-10-CM

## 2023-03-18 DIAGNOSIS — E785 Hyperlipidemia, unspecified: Secondary | ICD-10-CM

## 2023-03-18 DIAGNOSIS — M8580 Other specified disorders of bone density and structure, unspecified site: Secondary | ICD-10-CM

## 2023-03-18 DIAGNOSIS — Z1231 Encounter for screening mammogram for malignant neoplasm of breast: Secondary | ICD-10-CM

## 2023-03-18 DIAGNOSIS — Z23 Encounter for immunization: Secondary | ICD-10-CM | POA: Diagnosis not present

## 2023-03-18 DIAGNOSIS — I25119 Atherosclerotic heart disease of native coronary artery with unspecified angina pectoris: Secondary | ICD-10-CM

## 2023-03-18 DIAGNOSIS — Z0000 Encounter for general adult medical examination without abnormal findings: Secondary | ICD-10-CM

## 2023-03-18 DIAGNOSIS — I200 Unstable angina: Secondary | ICD-10-CM

## 2023-03-18 DIAGNOSIS — G4733 Obstructive sleep apnea (adult) (pediatric): Secondary | ICD-10-CM

## 2023-03-18 DIAGNOSIS — I10 Essential (primary) hypertension: Secondary | ICD-10-CM

## 2023-03-18 DIAGNOSIS — Z955 Presence of coronary angioplasty implant and graft: Secondary | ICD-10-CM

## 2023-03-18 DIAGNOSIS — E1169 Type 2 diabetes mellitus with other specified complication: Secondary | ICD-10-CM | POA: Diagnosis not present

## 2023-03-18 LAB — COMPREHENSIVE METABOLIC PANEL
ALT: 11 U/L (ref 0–35)
AST: 14 U/L (ref 0–37)
Albumin: 4.1 g/dL (ref 3.5–5.2)
Alkaline Phosphatase: 72 U/L (ref 39–117)
BUN: 18 mg/dL (ref 6–23)
CO2: 25 meq/L (ref 19–32)
Calcium: 9.1 mg/dL (ref 8.4–10.5)
Chloride: 104 meq/L (ref 96–112)
Creatinine, Ser: 0.81 mg/dL (ref 0.40–1.20)
GFR: 74.01 mL/min (ref >60.00)
Glucose, Bld: 90 mg/dL (ref 70–99)
Potassium: 3.9 meq/L (ref 3.5–5.1)
Sodium: 138 meq/L (ref 135–145)
Total Bilirubin: 0.3 mg/dL (ref 0.2–1.2)
Total Protein: 7.5 g/dL (ref 6.0–8.3)

## 2023-03-18 LAB — LIPID PANEL
Cholesterol: 96 mg/dL (ref 0–200)
HDL: 25.8 mg/dL — ABNORMAL LOW (ref >39.00)
LDL Cholesterol: 42 mg/dL (ref 0–99)
NonHDL: 70.14
Total CHOL/HDL Ratio: 4
Triglycerides: 141 mg/dL (ref 0.0–149.0)
VLDL: 28.2 mg/dL (ref 0.0–40.0)

## 2023-03-18 LAB — MICROALBUMIN / CREATININE URINE RATIO
Creatinine,U: 167 mg/dL
Microalb Creat Ratio: 12.5 mg/g (ref 0.0–30.0)
Microalb, Ur: 2.1 mg/dL — ABNORMAL HIGH (ref 0.0–1.9)

## 2023-03-18 LAB — CBC
HCT: 38.3 % (ref 36.0–46.0)
Hemoglobin: 12.5 g/dL (ref 12.0–15.0)
MCHC: 32.6 g/dL (ref 30.0–36.0)
MCV: 82.7 fL (ref 78.0–100.0)
Platelets: 327 10*3/uL (ref 150.0–400.0)
RBC: 4.63 Mil/uL (ref 3.87–5.11)
RDW: 16.1 % — ABNORMAL HIGH (ref 11.5–15.5)
WBC: 6.7 10*3/uL (ref 4.0–10.5)

## 2023-03-18 LAB — HEMOGLOBIN A1C: Hgb A1c MFr Bld: 6.4 % (ref 4.6–6.5)

## 2023-03-18 LAB — TSH: TSH: 3.05 u[IU]/mL (ref 0.35–5.50)

## 2023-03-18 MED ORDER — TIRZEPATIDE 5 MG/0.5ML ~~LOC~~ SOAJ: 5.0000 mg | SUBCUTANEOUS | 1 refills | Status: DC

## 2023-03-18 MED ORDER — AMLODIPINE BESYLATE 5 MG PO TABS: 5.0000 mg | ORAL_TABLET | Freq: Every day | ORAL | 1 refills | Status: DC

## 2023-03-18 MED ORDER — VITAMIN D (ERGOCALCIFEROL) 1.25 MG (50000 UNIT) PO CAPS
50000.0000 [IU] | ORAL_CAPSULE | ORAL | 4 refills | Status: DC
Start: 2023-03-18 — End: 2023-10-10

## 2023-03-18 MED ORDER — CARVEDILOL 6.25 MG PO TABS: 6.2500 mg | ORAL_TABLET | Freq: Two times a day (BID) | ORAL | 1 refills | Status: DC

## 2023-03-18 MED ORDER — MONTELUKAST SODIUM 10 MG PO TABS: ORAL_TABLET | ORAL | 1 refills | Status: DC

## 2023-03-18 MED ORDER — MELOXICAM 15 MG PO TABS: 15.0000 mg | ORAL_TABLET | Freq: Every day | ORAL | 1 refills | Status: DC

## 2023-03-18 MED ORDER — LISINOPRIL-HYDROCHLOROTHIAZIDE 20-12.5 MG PO TABS
1.0000 | ORAL_TABLET | Freq: Every day | ORAL | 1 refills | Status: DC
Start: 2023-03-18 — End: 2023-07-01

## 2023-03-18 MED ORDER — ROSUVASTATIN CALCIUM 20 MG PO TABS: 20.0000 mg | ORAL_TABLET | Freq: Every day | ORAL | 3 refills | Status: DC

## 2023-03-18 MED ORDER — SERTRALINE HCL 100 MG PO TABS: 100.0000 mg | ORAL_TABLET | Freq: Every evening | ORAL | 1 refills | Status: DC

## 2023-03-18 NOTE — Patient Instructions (Addendum)

## 2023-03-18 NOTE — Progress Notes (Signed)
Patient ID: Kristie Owens, female  DOB: 04-05-1955, 68 y.o.   MRN: [id number] Patient Care Team    Relationship Specialty Notifications Start End  Natalia Leatherwood, DO PCP - General Family Medicine  06/09/19   Jake Bathe, MD PCP - Cardiology Cardiology  12/17/19   Jake Bathe, MD Consulting Physician Cardiology  06/08/19   Jill Side, OD Referring Physician   06/11/19   Moody Bruins Physician Assistant Chiropractic Medicine  06/11/19   Jill Side, OD Referring Physician   03/13/21     Chief Complaint  Patient presents with   Annual Exam    Pt is fasting.     Subjective: HELLENA Owens is a 68 y.o.  Female  present for chronic condition manage All past medical history, surgical history, allergies, family history, immunizations, medications and social history were updated in the electronic medical record today. All recent labs, ED visits and hospitalizations within the last year were reviewed.  Health maintenance:  Colonoscopy: completed 09/05/2022. Dr. Tomasa Rand- 7 yr f/u Mammogram: completed: 07/02/2022- [facility]> ordered placed Immunizations: tdap updated today, Influenza UTD 2024 (encouraged yearly), PNA series completed, shingrix completed Infectious disease screening: Hep C screening completed DEXA: Estrogen deficient and osteopenia.  Completed 07/2022 (-1.8)[facility] patient has a Dental home. Hospitalizations/ED visits: reviewed   Hypertension/hyperlipidemia/coronary artery disease/TIA: Pt reports compliance with lisinopril-HCTZ 20-12.5, amlodipine 5 mg, Coreg 6.25 mg twice daily, Crestor 20 mg daily. Patient denies chest pain, shortness of breath, dizziness or lower extremity edema.  Patient is established with Dr. Anne Fu for cardiology.  Has a history of coronary artery disease status post heart catheterization in December 2012 and May 2018 demonstrating widely patent LAD and RCA stent, mild irregularity of the ostium of the diagonal where PTCA was felt to be  high risk RF: Hypertension, hyperlipidemia, obesity, family history of heart disease   Echocardiogram 12/07/2019 IMPRESSIONS   1. Left ventricular ejection fraction, by estimation, is 65 to 70%. The  left ventricle has normal function. The left ventricle has no regional  wall motion abnormalities. There is mild left ventricular hypertrophy.  Left ventricular diastolic parameters  are indeterminate.   2. Right ventricular systolic function is normal. The right ventricular  size is normal. Tricuspid regurgitation signal is inadequate for assessing  PA pressure.   3. The mitral valve is normal in structure. No evidence of mitral valve  regurgitation.   4. The aortic valve was not well visualized. Aortic valve regurgitation  is not visualized. Mild aortic valve sclerosis is present, with no  evidence of aortic valve stenosis   Cardiac catheterization on  12/04/2019 1. Patent proximal and mid LAD stents 2. Small caliber circumflex with mild to moderate non-obstructive disease 3. Large dominant RCA with patent mid stent, mild proximal stenosis. Moderate disease in the PDA and posterolateral artery, unchanged from last cath.  4. Normal LV systolic function, LVEF=65-70%. LVEDP 16   Recommendations: Continue medical management of CAD.  06/04/16: Widely patent proximal and distal LAD stents. 40% ostial narrowing in the first diagonal. Patent mid right coronary stents. Proximal 40% RCA eccentric narrowing. Circumflex is a small vessel with 40-50% first obtuse marginal. Ramus intermedius (or first obtuse marginal depending upon categorization) contains 40% proximal narrowing. Normal LV function. Elevated LVEDP at 23 mmHg. EF 55%.   Depression/anxiety/insomnia: Patient has a history of depression with anxiety, as well as insomnia.  She reports compliance with Zoloft 100 mg daily.  She feels this medication is  working well for her Tried for insomnia: Belsomra, Ambien, Remeron, trazodone, vistaril.  Nothing has worked well for her.    Allergic rhinitis: Patient reports compliance with Singulair and dymista nasal spray.  She is taking allegra.    Diabetes type 2: Patient has history of mildly elevated A1c in the diabetic range. She is watching her diet and metformin recently discontinued secondary to diarrhea of other causes.  Tolerating Mounjaro 5 mg weekly Patient denies dizziness, hyperglycemic or hypoglycemic events. Patient denies numbness, tingling in the extremities or nonhealing wounds of feet.       03/18/2023    8:42 AM 08/08/2022   11:23 AM 03/12/2022    8:04 AM 08/28/2021    9:04 AM 03/13/2021   11:15 AM  Depression screen PHQ 2/9  Decreased Interest 0 0 0 0 0  Down, Depressed, Hopeless 0 0 0 0 0  PHQ - 2 Score 0 0 0 0 0  Altered sleeping 3  3 2 3   Tired, decreased energy 1  1 1 1   Change in appetite 0  0 0 2  Feeling bad or failure about yourself  0  0 0 0  Trouble concentrating 0  0 0 0  Moving slowly or fidgety/restless 0  0 0 0  Suicidal thoughts 0  0 0 0  PHQ-9 Score 4  4 3 6   Difficult doing work/chores Somewhat difficult          03/18/2023    8:43 AM 03/12/2022    8:05 AM 08/28/2021    9:04 AM 03/13/2021   11:15 AM  GAD 7 : Generalized Anxiety Score  Nervous, Anxious, on Edge 0 0 0 0  Control/stop worrying 0 0 0 0  Worry too much - different things 0 0 0 0  Trouble relaxing 0 0 1 1  Restless 0 0 0 1  Easily annoyed or irritable 0 0 0 0  Afraid - awful might happen 0 0 0 0  Total GAD 7 Score 0 0 1 2  Anxiety Difficulty Not difficult at all       Immunization History  Administered Date(s) Administered   Fluad Quad(high Dose 65+) 10/19/2019, 11/21/2020, 12/27/2021   Fluad Trivalent(High Dose 65+) 11/12/2022   Influenza Split 12/28/2010, 11/22/2011, 11/13/2012   Influenza, High Dose Seasonal PF 11/21/2018   Influenza,inj,Quad PF,6+ Mos 12/15/2013, 12/13/2016, 11/21/2017   Influenza,inj,quad, With Preservative 09/19/2015   Influenza-Unspecified  11/21/2018   Moderna Covid-19 Vaccine Bivalent Booster 4yrs & up 03/27/2021   Moderna Sars-Covid-2 Vaccination 02/19/2019, 03/27/2019, 12/19/2019   Pneumococcal Conjugate-13 11/21/2017   Pneumococcal Polysaccharide-23 11/25/2018   Tdap 08/10/2013, 03/18/2023   Zoster Recombinant(Shingrix) 12/30/2019, 11/21/2020   Past Medical History:  Diagnosis Date   Achilles bursitis or tendinitis 11/11/2012   right    Allergy    Angina at rest [facility])    Dr Anne Fu   CAD (coronary artery disease), native coronary artery    DES to LAD in 2 areas, one at the bifurcation of a diagonal branch and also in the mid to distal region. Her diagonal branch was also ballooned . She has a DES to RCA 10/10. Her circumflex artery mild ostial disease.   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   Tremor 03/23/2020   Allergies  Allergen Reactions   Bupropion Nausea Only and Other (See Comments)    Nausea and headaches    Sulfa Antibiotics Other (See Comments)    From childhood- reaction not recalled   Prednisone Anxiety and Other (See Comments)    "Made me not feel like myself, altered"   Past Surgical History:  Procedure Laterality Date   BREAST EXCISIONAL BIOPSY Right 1995   CAROTID STENT     CESAREAN SECTION  1983   FOOT SURGERY     LEFT HEART CATH AND CORONARY ANGIOGRAPHY N/A 06/04/2016   Procedure: Left Heart Cath and Coronary Angiography;  Surgeon: Lyn Records, MD;  Location: [facility] INVASIVE CV LAB;  Service:  Cardiovascular;  Laterality: N/A;   LEFT HEART CATH AND CORONARY ANGIOGRAPHY N/A 12/04/2019   Procedure: LEFT HEART CATH AND CORONARY ANGIOGRAPHY;  Surgeon: Kathleene Hazel, MD;  Location: [facility] INVASIVE CV LAB;  Service: Cardiovascular;  Laterality: N/A;   LEFT HEART CATHETERIZATION WITH CORONARY ANGIOGRAM N/A 01/16/2011   Procedure: LEFT HEART CATHETERIZATION WITH CORONARY ANGIOGRAM;  Surgeon: Donato Schultz, MD;  Location: [facility] CATH LAB;  Service: Cardiovascular;  Laterality: N/A;   NOSE SURGERY     SINOSCOPY     Family History  Problem Relation Age of Onset   Hypertension Mother    Heart disease Mother    COPD Mother    Ulcers Mother    Lung cancer Father    Hypertension Sister    Diabetes Sister    Arthritis Sister    Depression Sister    Miscarriages / Stillbirths Sister    Early death Maternal Grandmother    Tuberculosis Maternal Grandmother    Lung cancer Maternal Grandfather    Suicidality Maternal Grandfather    Early death Maternal Grandfather    Heart failure Paternal Grandmother    Heart attack Paternal Grandmother    Hypertension Paternal Grandmother    Hyperlipidemia Paternal Grandmother    Heart disease Paternal Grandmother    Heart disease Paternal Grandfather    Hyperlipidemia Paternal Grandfather    Hypertension Paternal Grandfather    Hyperlipidemia Brother    Hypertension Brother    Intellectual disability Brother    Kidney disease Brother    Heart attack Brother    Asthma Brother    Early death Brother 57   Hyperlipidemia Brother    Hypertension Brother    Intellectual disability Brother    Kidney disease Brother    Heart disease Brother    Alcohol abuse Brother    Heart attack Other    Stroke Neg Hx    Breast cancer Neg Hx    Social History   Social History Narrative   Marital status/children/pets: Married.  One child.   Education/employment: Retired.  Associates degree in college.   Safety:      -Wears a bicycle helmet riding a bike: Yes      -smoke alarm in the home:Yes     - wears seatbelt: Yes     - Feels safe in their relationships: Yes    Allergies as of 03/18/2023       Reactions  Bupropion Nausea Only, Other (See Comments)   Nausea and headaches    Sulfa Antibiotics Other (See Comments)   From childhood- reaction not recalled   Prednisone Anxiety, Other (See Comments)   "Made me not feel like myself, altered"        Medication List        Accurate as of March 18, 2023  9:21 AM. If you have any questions, ask your nurse or doctor.          STOP taking these medications    omeprazole 20 MG tablet Commonly known as: PRILOSEC OTC Stopped by: Felix Pacini       TAKE these medications    amLODipine 5 MG tablet Commonly known as: NORVASC Take 1 tablet (5 mg total) by mouth daily.   aspirin EC 81 MG tablet Take 81 mg by mouth daily.   budesonide 0.5 MG/2ML nebulizer solution Commonly known as: Pulmicort Use twice a day in a sinus rinse bottle.   carvedilol 6.25 MG tablet Commonly known as: COREG Take 1 tablet (6.25 mg total) by mouth 2 (two) times daily with a meal.   cetirizine 10 MG tablet Commonly known as: ZyrTEC Allergy Take 1 tablet (10 mg total) by mouth daily.   cromolyn 4 % ophthalmic solution Commonly known as: OPTICROM Place 1 drop into both eyes 4 (four) times daily as needed (itchy/watery eyes).   dicyclomine 10 MG capsule Commonly known as: BENTYL TAKE 1 CAPSULE (10 MG TOTAL) BY MOUTH 4 TIMES A DAY BEFORE MEALS AND AT BEDTIME   estradiol 0.1 MG/GM vaginal cream Commonly known as: ESTRACE Apply a pea-sized amount to finger tip and rub in vaginal and urethral area 3x weekly   isosorbide mononitrate 60 MG 24 hr tablet Commonly known as: IMDUR TAKE 1 TABLET BY MOUTH EVERY DAY   lisinopril-hydrochlorothiazide 20-12.5 MG tablet Commonly known as: ZESTORETIC Take 1 tablet by mouth daily.   meloxicam 15 MG tablet Commonly known as: MOBIC Take 1 tablet (15 mg  total) by mouth daily.   montelukast 10 MG tablet Commonly known as: SINGULAIR TAKE 1 TABLET BY MOUTH EVERYDAY AT BEDTIME   nitroGLYCERIN 0.4 MG SL tablet Commonly known as: NITROSTAT Place 1 tablet (0.4 mg total) under the tongue every 5 (five) minutes as needed for chest pain.   rosuvastatin 20 MG tablet Commonly known as: CRESTOR Take 1 tablet (20 mg total) by mouth daily.   sertraline 100 MG tablet Commonly known as: ZOLOFT Take 1 tablet (100 mg total) by mouth at bedtime.   tirzepatide 5 MG/0.5ML Pen Commonly known as: MOUNJARO Inject 5 mg into the skin once a week.   Vitamin D (Ergocalciferol) 1.25 MG (50000 UNIT) Caps capsule Commonly known as: DRISDOL Take 1 capsule (50,000 Units total) by mouth every 7 (seven) days.        All past medical history, surgical history, allergies, family history, immunizations andmedications were updated in the EMR today and reviewed under the history and medication portions of their EMR.       ROS 14 pt review of systems performed and negative (unless mentioned in an HPI)  Objective: BP 112/78   Pulse 72   Temp 98.6 F (37 C)   Ht 5\' 4"  (1.626 m)   Wt 190 lb 6.4 oz (86.4 kg)   SpO2 98%   BMI 32.68 kg/m  Physical Exam Vitals and nursing note reviewed.  Constitutional:      General: She is not in acute distress.  Appearance: Normal appearance. She is not ill-appearing, toxic-appearing or diaphoretic.  HENT:     Head: Normocephalic and atraumatic.  Eyes:     General: No scleral icterus.       Right eye: No discharge.        Left eye: No discharge.     Extraocular Movements: Extraocular movements intact.     Conjunctiva/sclera: Conjunctivae normal.     Pupils: Pupils are equal, round, and reactive to light.  Cardiovascular:     Rate and Rhythm: Normal rate and regular rhythm.  Pulmonary:     Effort: Pulmonary effort is normal. No respiratory distress.     Breath sounds: Normal breath sounds. No wheezing, rhonchi or  rales.  Musculoskeletal:     Right lower leg: No edema.     Left lower leg: No edema.  Skin:    General: Skin is warm.     Findings: No rash.  Neurological:     Mental Status: She is alert and oriented to person, place, and time. Mental status is at baseline.     Motor: No weakness.     Gait: Gait normal.  Psychiatric:        Mood and Affect: Mood normal.        Behavior: Behavior normal.        Thought Content: Thought content normal.        Judgment: Judgment normal.     Diabetic Foot Exam - Simple   Simple Foot Form Diabetic Foot exam was performed with the following findings: Yes 03/18/2023  8:55 AM  Visual Inspection No deformities, no ulcerations, no other skin breakdown bilaterally: Yes Sensation Testing Intact to touch and monofilament testing bilaterally: Yes Pulse Check Posterior Tibialis and Dorsalis pulse intact bilaterally: Yes Comments      No results found.  Assessment/plan: TRIA NOGUERA is a 68 y.o. female present for CPE and chronic condition manage Osteoarthritis Stable Continue  Mobic qd with food.  - continue  OTC voltaren gel/cream.  - discussed cherry tart extract and tumeric natural supplements and their SE potentials  - ANA, RF, CCP, ESR, CRP > WNL   Major depressive disorder, remission status unspecified, unspecified whether recurrent/insomnia Stable Continue  Zoloft 100 mg daily. Discontinued Ambien 2021.   DCd trazodone taper 25-75 mg nightly.> not helpful Dc vistaril> not helpful and felt bad on higher dose.  DC'd belsomra > discontinued> expensive, will low-dose worked okay but not well.   Diabetes type 2: Stable Patient encouraged to continue exercise and dietary modifications.  Discontinued metformin for now secondary to  diarrhea of unknown cause Continue mounjaro 5 mg weekly a1c improved today 5.7 (was 6.7)> 5.8> 6.6 > 5.9>5.5> collected today Eye exam up-to-date 12/11/2022  Foot exam completed 03/18/2023 - Microalbumin /  creatinine urine ratio-03/2023    Essential hypertension/hyperlipidemia/Presence of coronary angioplasty implant and graft/Atherosclerosis of native coronary artery of native heart with other form of angina pectoris (HCC)/Intermediate coronary syndrome (HCC)/ Palpitations/Transient neurologic deficit/Morbid obesity (HCC) stable Continue daily exercise. Continue low-sodium/heart healthy diet. Continue  amlodipine 5 mg daily Continue  Coreg 6.25 mg twice daily Continue lisinopril-HCTZ 20-12.5 mg daily Continue  Crestor 20 mg daily Continue baby aspirin daily  Allergic rhinitis, unspecified seasonality, unspecified trigger Continue antihistamine of choice.  OTC  Continue  Singulair Est with A&A   Need for Tdap vaccination (Primary) Administered today Osteopenia, unspecified location/Vitamin D deficiency - Vitamin D (25 hydroxy) Breast cancer screening by mammogram - MM 3D SCREENING MAMMOGRAM BILATERAL BREAST;  Future  Routine general medical examination at a health care facility - CBC - Comprehensive metabolic panel - TSH Patient was encouraged to exercise greater than 150 minutes a week. Patient was encouraged to choose a diet filled with fresh fruits and vegetables, and lean meats. AVS provided to patient today for education/recommendation on gender specific health and safety maintenance. Colonoscopy: completed 09/05/2022. Dr. Tomasa Rand- 7 yr f/u Mammogram: completed: 07/02/2022- [facility]> ordered placed Immunizations: tdap updated today, Influenza UTD 2024 (encouraged yearly), PNA series completed, shingrix completed Infectious disease screening: Hep C screening completed DEXA: Estrogen deficient and osteopenia.  Completed 07/2022 (-1.8)[facility] patient has a Dental home. Hospitalizations/ED visits: reviewed   Return in about 15 weeks (around 07/01/2023) for Routine chronic condition follow-up.  Orders Placed This Encounter  Procedures   MM 3D SCREENING MAMMOGRAM BILATERAL BREAST    Tdap vaccine greater than or equal to 7yo IM   CBC   Hemoglobin A1c   Comprehensive metabolic panel   Lipid panel   TSH   Urine Microalbumin w/creat. ratio   Vitamin D (25 hydroxy)   Ambulatory referral to Gynecology   Meds ordered this encounter  Medications   montelukast (SINGULAIR) 10 MG tablet    Sig: TAKE 1 TABLET BY MOUTH EVERYDAY AT BEDTIME    Dispense:  90 tablet    Refill:  1   sertraline (ZOLOFT) 100 MG tablet    Sig: Take 1 tablet (100 mg total) by mouth at bedtime.    Dispense:  90 tablet    Refill:  1   tirzepatide (MOUNJARO) 5 MG/0.5ML Pen    Sig: Inject 5 mg into the skin once a week.    Dispense:  6 mL    Refill:  1    #2:2   Vitamin D, Ergocalciferol, (DRISDOL) 1.25 MG (50000 UNIT) CAPS capsule    Sig: Take 1 capsule (50,000 Units total) by mouth every 7 (seven) days.    Dispense:  12 capsule    Refill:  4   rosuvastatin (CRESTOR) 20 MG tablet    Sig: Take 1 tablet (20 mg total) by mouth daily.    Dispense:  90 tablet    Refill:  3   lisinopril-hydrochlorothiazide (ZESTORETIC) 20-12.5 MG tablet    Sig: Take 1 tablet by mouth daily.    Dispense:  90 tablet    Refill:  1   carvedilol (COREG) 6.25 MG tablet    Sig: Take 1 tablet (6.25 mg total) by mouth 2 (two) times daily with a meal.    Dispense:  180 tablet    Refill:  1   amLODipine (NORVASC) 5 MG tablet    Sig: Take 1 tablet (5 mg total) by mouth daily.    Dispense:  90 tablet    Refill:  1   meloxicam (MOBIC) 15 MG tablet    Sig: Take 1 tablet (15 mg total) by mouth daily.    Dispense:  90 tablet    Refill:  1   Referral Orders         Ambulatory referral to Gynecology       Electronically signed by: Felix Pacini, DO  Primary [facility]- [place]

## 2023-03-22 ENCOUNTER — Encounter: Payer: Self-pay | Admitting: Cardiology

## 2023-03-22 ENCOUNTER — Ambulatory Visit: Payer: Managed Care, Other (non HMO) | Attending: Cardiology | Admitting: Cardiology

## 2023-03-22 VITALS — BP 120/56 | HR 79 | Ht 64.0 in | Wt 190.2 lb

## 2023-03-22 DIAGNOSIS — E1169 Type 2 diabetes mellitus with other specified complication: Secondary | ICD-10-CM

## 2023-03-22 DIAGNOSIS — E785 Hyperlipidemia, unspecified: Secondary | ICD-10-CM

## 2023-03-22 DIAGNOSIS — I25119 Atherosclerotic heart disease of native coronary artery with unspecified angina pectoris: Secondary | ICD-10-CM | POA: Diagnosis not present

## 2023-03-22 DIAGNOSIS — E782 Mixed hyperlipidemia: Secondary | ICD-10-CM | POA: Diagnosis not present

## 2023-03-22 DIAGNOSIS — I10 Essential (primary) hypertension: Secondary | ICD-10-CM | POA: Diagnosis not present

## 2023-03-22 NOTE — Progress Notes (Signed)
Cardiology Office Note:  .   Date:  03/22/2023  ID:  Kristie Owens, DOB 10/04/1955, MRN [id number] PCP: Natalia Leatherwood, DO  [facility] Providers Cardiologist:  Donato Schultz, MD    History of Present Illness: .   Kristie Owens is a 67 y.o. female Discussed the use of AI scribe software for clinical note transcription with the patient, who gave verbal consent to proceed.  History of Present Illness   Kristie Owens is a 68 year old female with coronary artery disease who presents for follow-up after prior LAD and RCA stents.  She has a history of coronary artery disease and underwent cardiac catheterization in 2021, with stents placed in the left anterior descending (LAD) artery and the right coronary artery (RCA). She experiences longstanding chest discomfort, managed with isosorbide, previously at 90 mg and currently at 60 mg daily. Despite these interventions, she continues to have occasional chest discomfort.  She has type 2 diabetes .She is currently taking Mounjaro, which aids in diabetes management and weight loss. She is concerned about the cost of Mounjaro and plans to discuss this with her insurance company.  Her medication regimen includes Crestor 20 mg daily, maintaining her LDL at 55, amlodipine 5 mg daily, carvedilol, lisinopril/hydrochlorothiazide 20/12.5 mg, and aspirin 81 mg daily. Amlodipine has improved her symptoms but causes occasional ankle puffiness.  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 She acknowledges the need to improve her exercise routine. Her last echocardiogram in 2021 showed good pump and valvular function, and recent blood work indicated no anemia and good kidney function.           Studies Reviewed: [name]   EKG Interpretation Date/Time:  Friday March 22 2023 09:46:43 EST Ventricular Rate:  79 PR Interval:  186 QRS Duration:  72 QT Interval:  356 QTC  Calculation: 408 R Axis:   -6  Text Interpretation: Normal sinus rhythm Minimal voltage criteria for LVH, may be normal variant ( R in aVL ) When compared with ECG of 03-Jul-2018 04:38, No significant change was found Confirmed by Donato Schultz ([id number]) on 03/22/2023 9:55:13 AM    Results   LABS LDL: 55 (03/2023) Triglycerides: 141 (03/2023) Hb: 12.5 (03/2023) Cr: 0.81 (03/2023)  DIAGNOSTIC Echocardiogram: Normal left ventricular function, no significant valvular abnormalities (2021) Cardiac catheterization: LAD stents, RCA stent (2021) EKG: Normal (03/2023)     Risk Assessment/Calculations:            Physical Exam:   VS:  BP (!) 120/56   Pulse 79   Ht 5\' 4"  (1.626 m)   Wt 190 lb 3.2 oz (86.3 kg)   SpO2 97%   BMI 32.65 kg/m    Wt Readings from Last 3 Encounters:  03/22/23 190 lb 3.2 oz (86.3 kg)  03/18/23 190 lb 6.4 oz (86.4 kg)  01/16/23 191 lb (86.6 kg)    GEN: Well nourished, well developed in no acute distress NECK: No JVD; No carotid bruits CARDIAC: RRR, no murmurs, no rubs, no gallops RESPIRATORY:  Clear to auscultation without rales, wheezing or rhonchi  ABDOMEN: Soft, non-tender, non-distended EXTREMITIES:  No edema; No deformity   ASSESSMENT AND PLAN: .    Assessment and Plan    Coronary Artery Disease (CAD) 68 year old female with CAD, status post LAD and RCA stents in 2021. Reports intermittent chest discomfort managed with isosorbide 60 mg daily. Last echocardiogram in 2021 showed good pump and valvular function. EKG  today shows no major changes. Blood pressure is well-controlled at 120/56. Weight is 190 lbs. Discussed the importance of regular exercise to improve cardiovascular health. Explained that isosorbide and carvedilol help relax the heart and arteries to manage chest pain. - Continue isosorbide 60 mg daily - Continue carvedilol 6.25 mg daily - Continue amlodipine 5 mg daily - Continue lisinopril/hydrochlorothiazide 20/12.5 mg daily - Continue  rosuvastatin 20 mg daily - Continue aspirin 81 mg daily - Follow up in one year - Contact office if symptoms change  Hypertension Hypertension well-controlled with current medication regimen. Blood pressure today is 120/56. Explained that amlodipine helps with anginal symptoms and may cause ankle edema. - Continue amlodipine 5 mg daily - Continue lisinopril/hydrochlorothiazide 20/12.5 mg daily - Continue carvedilol 6.25 mg daily  Hyperlipidemia Hyperlipidemia managed with rosuvastatin 20 mg daily. LDL at goal of 55 mg/dL. Triglycerides at 141 mg/dL. - Continue rosuvastatin 20 mg daily  Type 2 Diabetes Mellitus Type 2 diabetes mellitus with prior hemoglobin A1c of 5.0. Currently on Mounjaro for diabetes management, which also aids in weight loss. Patient reports high cost of medication ($3,000 for three months) and plans to contact insurance for potential coverage adjustments. Discussed the importance of continuing Mounjaro for diabetes control and the potential benefits of weight loss. - Continue Mounjaro - [employer] for coverage details - Discuss prescription options with primary care physician  General Health Maintenance Patient is active but not engaging in structured exercise. Encouraged to increase physical activity to 30 minutes daily to improve cardiovascular health. - Encourage 30 minutes of daily exercise  Follow-up - Follow up in one year - Contact office if symptoms change.               Signed, Donato Schultz, MD

## 2023-03-22 NOTE — Patient Instructions (Signed)
 Medication Instructions:  The current medical regimen is effective;  continue present plan and medications.  *If you need a refill on your cardiac medications before your next appointment, please call your pharmacy*  Follow-Up: At [facility], you and your health needs are our priority.  As part of our continuing mission to provide you with exceptional heart care, we have created designated Provider Care Teams.  These Care Teams include your primary Cardiologist (physician) and Advanced Practice Providers (APPs -  Physician Assistants and Nurse Practitioners) who all work together to provide you with the care you need, when you need it.  We recommend signing up for the patient portal called "MyChart".  Sign up information is provided on this After Visit Summary.  MyChart is used to connect with patients for Virtual Visits (Telemedicine).  Patients are able to view lab/test results, encounter notes, upcoming appointments, etc.  Non-urgent messages can be sent to your provider as well.   To learn more about what you can do with MyChart, go to [email or web address].    Your next appointment:   1 year(s)  Provider:   Donato Schultz, MD

## 2023-04-11 ENCOUNTER — Other Ambulatory Visit (HOSPITAL_COMMUNITY)
Admission: RE | Admit: 2023-04-11 | Discharge: 2023-04-11 | Disposition: A | Discharge status: Home / Self Care | Source: Ambulatory Visit | Attending: Obstetrics & Gynecology | Admitting: Obstetrics & Gynecology

## 2023-04-11 ENCOUNTER — Encounter (HOSPITAL_BASED_OUTPATIENT_CLINIC_OR_DEPARTMENT_OTHER): Payer: Self-pay | Admitting: Obstetrics & Gynecology

## 2023-04-11 ENCOUNTER — Ambulatory Visit (HOSPITAL_BASED_OUTPATIENT_CLINIC_OR_DEPARTMENT_OTHER): Payer: Managed Care, Other (non HMO) | Admitting: Obstetrics & Gynecology

## 2023-04-11 VITALS — BP 136/61 | HR 77 | Ht 63.0 in | Wt 189.2 lb

## 2023-04-11 DIAGNOSIS — Z124 Encounter for screening for malignant neoplasm of cervix: Secondary | ICD-10-CM | POA: Diagnosis not present

## 2023-04-11 DIAGNOSIS — N9489 Other specified conditions associated with female genital organs and menstrual cycle: Secondary | ICD-10-CM | POA: Diagnosis not present

## 2023-04-11 NOTE — Progress Notes (Signed)
 GYNECOLOGY  VISIT  CC:   new patient consult Referring Provider:  Dr. Felix Pacini  HPI: 68 y.o. G1P1 Married White or Caucasian female here for recommendations regarding cystic lesion that was noted on Ct abd and pelvis 08/15/2022.  Pt reports at that time she was having a lot of issues with diarrhea and subsequent pain and the CT was obtained.  Lab work was all normal including CBC, Lipase.  Stool tests were negative including C Diff and stool culture.  Fecal fat testing was abnormal.  CT was performed 7/17 with several findings were noted including:  1)  1.5 x 1.2cm exophytic lesion of the left mid kidney and questionable simple appearing cyst in similar location in kidney.  Further workup recommended. 2)  Stable borderline prominence of dorsal pancreatic duct along portions of the pancreatic head. 3)  Abnormally thickened appearance of the distal appendix up to 1cm in diameter, possibly acute appendicitis not excluded. 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  (Was 4.1 x 3.1 x 3.5cm in 2020)  She saw Dr. Darreld Mclean on 7/18.  Pt was asymptomatic at that appointment for appendicitis with no abdominal pain.  MRI recommended.  This was initially declined by insurance.    Colonoscopy done 09/05/2022 showing tubular adenoma and 2 polyps.  Adenoma present with pathology.    MRI of abdomen eventually was approved by insurance and completed on 10/22/12024 with following results:   1)  multiple renal cysts with hemorrhagic or proteinaceous cyst of the posterior midportion of the left kidney and a second intermediate hemorrhagia or proteinaceous cyst of the inferior pole of the right kidney.  No additional evaluation recommended.   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  Pap smear is not updated.     Past Medical History:  Diagnosis Date   Achilles bursitis or tendinitis 11/11/2012   right    Allergy    Angina at rest [facility])    Dr Anne Fu   CAD (coronary artery disease), native coronary artery    DES to LAD in 2 areas, one at the bifurcation of a diagonal branch and also in the mid to distal region. Her diagonal branch was also ballooned . She has a DES to RCA 10/10. Her circumflex artery mild ostial disease.   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   Tremor 03/23/2020    MEDS:   Current Outpatient Medications on File Prior to Visit  Medication Sig Dispense Refill   amLODipine (NORVASC) 5 MG tablet Take 1 tablet (5 mg total)  by mouth daily. 90 tablet 1   aspirin EC 81 MG tablet Take 81 mg by mouth daily.     budesonide (PULMICORT) 0.5 MG/2ML nebulizer solution Use twice a day in a sinus rinse bottle. 120 mL 2   carvedilol (COREG) 6.25 MG tablet Take 1 tablet (6.25 mg total) by mouth 2 (two) times daily with a meal. 180 tablet 1   cetirizine (ZYRTEC ALLERGY) 10 MG tablet Take 1 tablet (10 mg total) by mouth daily. 30 tablet 5   cromolyn (OPTICROM) 4 % ophthalmic solution Place 1 drop into both eyes 4 (four) times daily as needed (itchy/watery eyes). 10 mL 5   dicyclomine (BENTYL) 10 MG capsule TAKE 1 CAPSULE (10 MG TOTAL) BY MOUTH 4  TIMES A DAY BEFORE MEALS AND AT BEDTIME 90 capsule 1   estradiol (ESTRACE) 0.1 MG/GM vaginal cream Apply a pea-sized amount to finger tip and rub in vaginal and urethral area 3x weekly 42.5 g 3   isosorbide mononitrate (IMDUR) 60 MG 24 hr tablet TAKE 1 TABLET BY MOUTH EVERY DAY 90 tablet 0   lisinopril-hydrochlorothiazide (ZESTORETIC) 20-12.5 MG tablet Take 1 tablet by mouth daily. 90 tablet 1   meloxicam (MOBIC) 15 MG tablet Take 1 tablet (15 mg total) by mouth daily. 90 tablet 1   montelukast (SINGULAIR) 10 MG tablet TAKE 1 TABLET BY MOUTH EVERYDAY AT BEDTIME 90 tablet 1   nitroGLYCERIN (NITROSTAT) 0.4 MG SL tablet Place 1 tablet (0.4 mg total) under the tongue every 5 (five) minutes as needed for chest pain. 25 tablet 3   rosuvastatin (CRESTOR) 20 MG tablet Take 1 tablet (20 mg total) by mouth daily. 90 tablet 3   sertraline (ZOLOFT) 100 MG tablet Take 1 tablet (100 mg total) by mouth at bedtime. 90 tablet 1   tirzepatide (MOUNJARO) 5 MG/0.5ML Pen Inject 5 mg into the skin once a week. 6 mL 1   Vitamin D, Ergocalciferol, (DRISDOL) 1.25 MG (50000 UNIT) CAPS capsule Take 1 capsule (50,000 Units total) by mouth every 7 (seven) days. 12 capsule 4   No current facility-administered medications on file prior to visit.    ALLERGIES: Bupropion, Sulfa antibiotics, and Prednisone  SH:    Review of Systems  Constitutional: Negative.   Genitourinary: Negative.     PHYSICAL EXAMINATION:    BP 136/61 (BP Location: Left Arm, Patient Position: Sitting, Cuff Size: Large)   Pulse 77   Ht 5\' 3"  (1.6 m)   Wt 189 lb 3.2 oz (85.8 kg)   BMI 33.52 kg/m     General appearance: alert, cooperative and appears stated age  Abdomen: soft, non-tender; bowel sounds normal; no masses,  no organomegaly Lymph:  no inguinal LAD noted  Pelvic: External genitalia:  no lesions              Urethra:  normal appearing urethra with no masses, tenderness or lesions              Bartholins and Skenes: normal                  Vagina: normal mucosa without prolapse or lesions              Cervix: no lesions              Bimanual Exam:  Uterus:  normal size, contour, position, consistency, mobility, non-tender              Adnexa: no mass, fullness, tenderness  Rectovaginal: Yes.  .  Confirms.              Anus:  normal sphincter tone, no lesions  Chaperone, Hendricks Milo, CMA, was present for exam.  Assessment/Plan: 1. Adnexal mass (Primary) - will check tumor marker today.  Discussed reasoning for this.  Pt comfortable with this plan.  Once this is back, will discussed with gyn onc as removal of mass may need to be done by more sub specialized surgeon.  Pt comfortable with this as well. - CA 125  2. Cervical cancer screening - Cytology - PAP( [place])

## 2023-04-12 LAB — CA 125: Cancer Antigen (CA) 125: 7.1 U/mL (ref 0.0–38.1)

## 2023-04-14 DIAGNOSIS — N9489 Other specified conditions associated with female genital organs and menstrual cycle: Secondary | ICD-10-CM | POA: Insufficient documentation

## 2023-04-16 LAB — CYTOLOGY - PAP
Comment: NEGATIVE
Comment: NEGATIVE
Comment: NEGATIVE
HPV 16: NEGATIVE
HPV 18 / 45: NEGATIVE
High risk HPV: POSITIVE — AB

## 2023-04-22 ENCOUNTER — Encounter (HOSPITAL_BASED_OUTPATIENT_CLINIC_OR_DEPARTMENT_OTHER): Payer: Self-pay | Admitting: Obstetrics & Gynecology

## 2023-05-01 ENCOUNTER — Telehealth (HOSPITAL_BASED_OUTPATIENT_CLINIC_OR_DEPARTMENT_OTHER): Payer: Self-pay | Admitting: *Deleted

## 2023-05-01 NOTE — Telephone Encounter (Signed)
 Pt called to set up appt for colposcopy and inquire about MRI. Appt provided

## 2023-05-01 NOTE — Telephone Encounter (Signed)
 Patient called and wants to speak to Dr.Miller nurse .

## 2023-05-09 ENCOUNTER — Encounter (HOSPITAL_BASED_OUTPATIENT_CLINIC_OR_DEPARTMENT_OTHER): Payer: Self-pay | Admitting: Obstetrics & Gynecology

## 2023-05-09 ENCOUNTER — Other Ambulatory Visit (HOSPITAL_COMMUNITY)
Admission: RE | Admit: 2023-05-09 | Discharge: 2023-05-09 | Disposition: A | Discharge status: Home / Self Care | Source: Ambulatory Visit | Attending: Obstetrics & Gynecology | Admitting: Obstetrics & Gynecology

## 2023-05-09 ENCOUNTER — Ambulatory Visit (HOSPITAL_BASED_OUTPATIENT_CLINIC_OR_DEPARTMENT_OTHER): Admitting: Obstetrics & Gynecology

## 2023-05-09 VITALS — BP 140/56 | HR 69 | Ht 63.0 in | Wt 188.0 lb

## 2023-05-09 DIAGNOSIS — R87610 Atypical squamous cells of undetermined significance on cytologic smear of cervix (ASC-US): Secondary | ICD-10-CM | POA: Insufficient documentation

## 2023-05-09 DIAGNOSIS — B977 Papillomavirus as the cause of diseases classified elsewhere: Secondary | ICD-10-CM

## 2023-05-09 DIAGNOSIS — R1900 Intra-abdominal and pelvic swelling, mass and lump, unspecified site: Secondary | ICD-10-CM | POA: Diagnosis not present

## 2023-05-09 NOTE — Progress Notes (Signed)
 68 y.o. G1P1 Married Not Hispanic or Latino female here for colposcopy with possible biopsies and/or ECC due to atrophic pattern with epithelial atypia with +HR HPV on Pap obtained 04/11/2023.  Has hx of abnormal pap in the past but never had a colposcopy.  Follow up pap was normal.    Unrelated, she has a cystic retroperitoneal lesion.  Ca-125 was 7.1 on 04/11/2023.  Have reviewed her hx with Dr. Pricilla Holm.  MRI of pelvic recommended to see if this is amenable to drainage.  Pt would be comfortable with plan.     No LMP recorded. Patient is postmenopausal.           Patient has been counseled about results and procedure.  Risks and benefits have bene reviewed including immediate and/or delayed bleeding, infection, cervical scaring from procedure, possibility of needing additional follow up as well as treatment.  Rare risks of missing a lesion discussed as well.  All questions answered.  Pt ready to proceed.  Consent obtained.  BP (!) 140/56 (BP Location: Right Arm, Patient Position: Sitting, Cuff Size: Normal)   Pulse 69   Ht 5\' 3"  (1.6 m)   Wt 188 lb (85.3 kg)   BMI 33.30 kg/m   General appearance: alert, cooperative and appears stated age Lymph nodes: No abnormal inguinal nodes palpated Neurologic: Grossly normal  Pelvic: External genitalia:  no lesions              Urethra:  normal appearing urethra with no masses, tenderness or lesions              Bartholins and Skenes: normal                 Vagina: normal appearing vagina with normal color and no discharge, no lesions               Physical Exam Constitutional:      Appearance: Normal appearance.  Genitourinary:    Vagina: Normal.     Cervix: Normal.  Neurological:     Mental Status: She is alert.    Speculum placed.  3% acetic acid applied to cervix for >45 seconds.  Cervix visualized with both 7.5X and 15X magnification.  Green filter also used.  Lugols solution was not used.  Findings:  no AWE.  Biopsy:  none obtained.  ECC:   was not performed.  Monsel's was not needed.  Excellent hemostasis was present.  Pt tolerated procedure well and all instruments were removed.    Chaperone, Hendricks Milo, CMA, was present during procedure.  Assessment/Plan: 1. 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 - if negative, will plan to repeat pap and HR HPV 1 year  2. Atypical squamous cells of undetermined significance on cytologic smear of cervix (ASC-US)  3. Retroperitoneal mass - MR PELVIS W WO CONTRAST; Future

## 2023-05-09 NOTE — Patient Instructions (Signed)
[facility] [address] [place]  [phone]

## 2023-05-10 ENCOUNTER — Encounter (HOSPITAL_BASED_OUTPATIENT_CLINIC_OR_DEPARTMENT_OTHER): Payer: Self-pay | Admitting: Obstetrics & Gynecology

## 2023-05-10 LAB — SURGICAL PATHOLOGY

## 2023-05-19 ENCOUNTER — Ambulatory Visit
Admission: RE | Admit: 2023-05-19 | Discharge: 2023-05-19 | Discharge status: Home / Self Care | Source: Ambulatory Visit | Attending: Obstetrics & Gynecology | Admitting: Obstetrics & Gynecology

## 2023-05-19 DIAGNOSIS — R1900 Intra-abdominal and pelvic swelling, mass and lump, unspecified site: Secondary | ICD-10-CM

## 2023-05-19 MED ORDER — GADOPICLENOL 0.5 MMOL/ML IV SOLN
8.0000 mL | Freq: Once | INTRAVENOUS | Status: AC | PRN
  Administered 2023-05-19: 8 mL via INTRAVENOUS

## 2023-05-27 ENCOUNTER — Other Ambulatory Visit

## 2023-06-02 ENCOUNTER — Other Ambulatory Visit: Payer: Self-pay | Admitting: Cardiology

## 2023-06-18 ENCOUNTER — Encounter (HOSPITAL_BASED_OUTPATIENT_CLINIC_OR_DEPARTMENT_OTHER): Payer: Self-pay | Admitting: Obstetrics & Gynecology

## 2023-07-01 ENCOUNTER — Ambulatory Visit: Payer: Managed Care, Other (non HMO) | Admitting: Family Medicine

## 2023-07-01 ENCOUNTER — Telehealth (HOSPITAL_BASED_OUTPATIENT_CLINIC_OR_DEPARTMENT_OTHER): Payer: Self-pay

## 2023-07-01 ENCOUNTER — Other Ambulatory Visit: Payer: Self-pay | Admitting: Medical Genetics

## 2023-07-01 ENCOUNTER — Encounter: Payer: Self-pay | Admitting: Family Medicine

## 2023-07-01 VITALS — BP 126/76 | HR 76 | Temp 98.4°F | Wt 191.0 lb

## 2023-07-01 DIAGNOSIS — F3340 Major depressive disorder, recurrent, in remission, unspecified: Secondary | ICD-10-CM | POA: Diagnosis not present

## 2023-07-01 DIAGNOSIS — I25119 Atherosclerotic heart disease of native coronary artery with unspecified angina pectoris: Secondary | ICD-10-CM

## 2023-07-01 DIAGNOSIS — E785 Hyperlipidemia, unspecified: Secondary | ICD-10-CM

## 2023-07-01 DIAGNOSIS — Z7984 Long term (current) use of oral hypoglycemic drugs: Secondary | ICD-10-CM

## 2023-07-01 DIAGNOSIS — I200 Unstable angina: Secondary | ICD-10-CM

## 2023-07-01 DIAGNOSIS — Z955 Presence of coronary angioplasty implant and graft: Secondary | ICD-10-CM

## 2023-07-01 DIAGNOSIS — E1169 Type 2 diabetes mellitus with other specified complication: Secondary | ICD-10-CM | POA: Diagnosis not present

## 2023-07-01 DIAGNOSIS — I10 Essential (primary) hypertension: Secondary | ICD-10-CM

## 2023-07-01 DIAGNOSIS — M8580 Other specified disorders of bone density and structure, unspecified site: Secondary | ICD-10-CM

## 2023-07-01 LAB — POCT GLYCOSYLATED HEMOGLOBIN (HGB A1C)
HbA1c POC (<> result, manual entry): 5.5 % (ref 4.0–5.6)
HbA1c, POC (controlled diabetic range): 5.5 % (ref 0.0–7.0)
HbA1c, POC (prediabetic range): 5.5 % — AB (ref 5.7–6.4)
Hemoglobin A1C: 5.5 % (ref 4.0–5.6)

## 2023-07-01 MED ORDER — MELOXICAM 15 MG PO TABS: 15.0000 mg | ORAL_TABLET | Freq: Every day | ORAL | 1 refills | Status: DC

## 2023-07-01 MED ORDER — MONTELUKAST SODIUM 10 MG PO TABS: ORAL_TABLET | ORAL | 1 refills | Status: DC

## 2023-07-01 MED ORDER — AMLODIPINE BESYLATE 5 MG PO TABS: 5.0000 mg | ORAL_TABLET | Freq: Every day | ORAL | 1 refills | Status: DC

## 2023-07-01 MED ORDER — CARVEDILOL 6.25 MG PO TABS: 6.2500 mg | ORAL_TABLET | Freq: Two times a day (BID) | ORAL | 1 refills | Status: DC

## 2023-07-01 MED ORDER — TIRZEPATIDE 7.5 MG/0.5ML ~~LOC~~ SOAJ: 7.5000 mg | SUBCUTANEOUS | 1 refills | Status: DC

## 2023-07-01 MED ORDER — SERTRALINE HCL 100 MG PO TABS: 100.0000 mg | ORAL_TABLET | Freq: Every evening | ORAL | 1 refills | Status: DC

## 2023-07-01 MED ORDER — LISINOPRIL-HYDROCHLOROTHIAZIDE 20-12.5 MG PO TABS: 1.0000 | ORAL_TABLET | Freq: Every day | ORAL | 1 refills | Status: DC

## 2023-07-01 NOTE — Telephone Encounter (Signed)
 Patient called today with concerns about a conversation she had with Dr. Annabell Key concerning MRI. Patient states she would like a phone call back from Dr. Annabell Key. She has left voicemails as well as sent MyChart messages. Please advise.

## 2023-07-01 NOTE — Progress Notes (Signed)
 Patient ID: Kristie Owens, female  DOB: Dec 12, 1955, 68 y.o.   MRN: [id number] Patient Care Team    Relationship Specialty Notifications Start End  Mariel Shope, DO PCP - General Family Medicine  06/09/19   Hugh Madura, MD PCP - Cardiology Cardiology  12/17/19   Hugh Madura, MD Consulting Physician Cardiology  06/08/19   Ranny Bye, OD Referring Physician   06/11/19   Erwin Heck Physician Assistant Chiropractic Medicine  06/11/19   Ranny Bye, OD Referring Physician   03/13/21     Chief Complaint  Patient presents with   Diabetes    Pt is not fasting.     Subjective: Kristie Owens is a 68 y.o.  Female  present for chronic condition manage All past medical history, surgical history, allergies, family history, immunizations, medications and social history were updated in the electronic medical record today. All recent labs, ED visits and hospitalizations within the last year were reviewed.   Hypertension/hyperlipidemia/coronary artery disease/TIA: Pt reports compliance with lisinopril -HCTZ 20-12.5, amlodipine  5 mg, Coreg  6.25 mg twice daily, Crestor  20 mg daily. Patient denies chest pain, shortness of breath, dizziness or lower extremity edema.   Patient is established with Dr. Renna Cary for cardiology.  Has a history of coronary artery disease status post heart catheterization in December 2012 and May 2018 demonstrating widely patent LAD and RCA stent, mild irregularity of the ostium of the diagonal where PTCA was felt to be high risk RF: Hypertension, hyperlipidemia, obesity, family history of heart disease   Echocardiogram 12/07/2019 IMPRESSIONS   1. Left ventricular ejection fraction, by estimation, is 65 to 70%. The  left ventricle has normal function. The left ventricle has no regional  wall motion abnormalities. There is mild left ventricular hypertrophy.  Left ventricular diastolic parameters  are indeterminate.   2. Right ventricular systolic  function is normal. The right ventricular  size is normal. Tricuspid regurgitation signal is inadequate for assessing  PA pressure.   3. The mitral valve is normal in structure. No evidence of mitral valve  regurgitation.   4. The aortic valve was not well visualized. Aortic valve regurgitation  is not visualized. Mild aortic valve sclerosis is present, with no  evidence of aortic valve stenosis   Cardiac catheterization on  12/04/2019 1. Patent proximal and mid LAD stents 2. Small caliber circumflex with mild to moderate non-obstructive disease 3. Large dominant RCA with patent mid stent, mild proximal stenosis. Moderate disease in the PDA and posterolateral artery, unchanged from last cath.  4. Normal LV systolic function, LVEF=65-70%. LVEDP 16   Recommendations: Continue medical management of CAD.  06/04/16: Widely patent proximal and distal LAD stents. 40% ostial narrowing in the first diagonal. Patent mid right coronary stents. Proximal 40% RCA eccentric narrowing. Circumflex is a small vessel with 40-50% first obtuse marginal. Ramus intermedius (or first obtuse marginal depending upon categorization) contains 40% proximal narrowing. Normal LV function. Elevated LVEDP at 23 mmHg. EF 55%.   Depression/anxiety/insomnia: Patient has a history of depression with anxiety, as well as insomnia.  She reports compliance with Zoloft  100 mg daily.  She feels this medication is working well for her Tried for insomnia: Belsomra , Ambien , Remeron , trazodone , vistaril . Nothing has worked well for her.    Allergic rhinitis: Patient reports compliance with Singulair  and dymista  nasal spray.  She is taking allegra.    Diabetes type 2: Patient has history of mildly elevated A1c in the diabetic range. She  is watching her diet and metformin  recently discontinued secondary to diarrhea of other causes.  Tolerating Mounjaro  5 mg weekly Patient denies dizziness, hyperglycemic or hypoglycemic events.  Patient denies numbness, tingling in the extremities or nonhealing wounds of feet.        07/01/2023    8:45 AM 04/11/2023    9:07 AM 03/18/2023    8:42 AM 08/08/2022   11:23 AM 03/12/2022    8:04 AM  Depression screen PHQ 2/9  Decreased Interest 0 0 0 0 0  Down, Depressed, Hopeless 0 0 0 0 0  PHQ - 2 Score 0 0 0 0 0  Altered sleeping 2  3  3   Tired, decreased energy 1  1  1   Change in appetite 1  0  0  Feeling bad or failure about yourself  0  0  0  Trouble concentrating 0  0  0  Moving slowly or fidgety/restless 0  0  0  Suicidal thoughts 0  0  0  PHQ-9 Score 4  4  4   Difficult doing work/chores Not difficult at all  Somewhat difficult        07/01/2023    8:45 AM 03/18/2023    8:43 AM 03/12/2022    8:05 AM 08/28/2021    9:04 AM  GAD 7 : Generalized Anxiety Score  Nervous, Anxious, on Edge 0 0 0 0  Control/stop worrying 0 0 0 0  Worry too much - different things 0 0 0 0  Trouble relaxing 2 0 0 1  Restless 0 0 0 0  Easily annoyed or irritable 0 0 0 0  Afraid - awful might happen 0 0 0 0  Total GAD 7 Score 2 0 0 1  Anxiety Difficulty Somewhat difficult Not difficult at all      Immunization History  Administered Date(s) Administered   Fluad Quad(high Dose 65+) 10/19/2019, 11/21/2020, 12/27/2021   Fluad Trivalent(High Dose 65+) 11/12/2022   Fluzone Influenza virus vaccine,trivalent (IIV3), split virus 12/28/2010, 11/22/2011, 11/13/2012   Influenza, High Dose Seasonal PF 11/21/2018   Influenza,inj,Quad PF,6+ Mos 12/15/2013, 12/13/2016, 11/21/2017   Influenza,inj,quad, With Preservative 09/19/2015   Influenza-Unspecified 11/21/2018   Moderna Covid-19 Vaccine Bivalent Booster 34yrs & up 03/27/2021   Moderna Sars-Covid-2 Vaccination 02/19/2019, 03/27/2019, 12/19/2019   Pneumococcal Conjugate-13 11/21/2017   Pneumococcal Polysaccharide-23 11/25/2018   Tdap 08/10/2013, 03/18/2023   Zoster Recombinant(Shingrix) 12/30/2019, 11/21/2020   Past Medical History:  Diagnosis Date    Achilles bursitis or tendinitis 11/11/2012   right    Allergy    Angina at rest [facility])    Dr Renna Cary   CAD (coronary artery disease), native coronary artery    DES to LAD in 2 areas, one at the bifurcation of a diagonal branch and also in the mid to distal region. Her diagonal branch was also ballooned . She has a DES to RCA 10/10. Her circumflex artery mild ostial disease.   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   Tremor 03/23/2020   Allergies  Allergen Reactions   Bupropion Nausea Only and Other (See Comments)    Nausea and headaches    Sulfa Antibiotics Other (See Comments)    From childhood- reaction not recalled   Prednisone  Anxiety and Other (See Comments)    "Made me not feel like myself, altered"   Past Surgical History:  Procedure Laterality Date   BREAST EXCISIONAL BIOPSY Right 1995   CAROTID STENT     CESAREAN SECTION  1983   FOOT SURGERY     LEFT HEART CATH AND CORONARY ANGIOGRAPHY N/A 06/04/2016   Procedure: Left Heart Cath and Coronary Angiography;  Surgeon: Arty Binning, MD;  Location: [facility] INVASIVE CV LAB;  Service: Cardiovascular;  Laterality: N/A;   LEFT HEART CATH AND CORONARY ANGIOGRAPHY N/A 12/04/2019   Procedure: LEFT HEART CATH AND CORONARY ANGIOGRAPHY;  Surgeon: Odie Benne, MD;  Location: [facility] INVASIVE CV LAB;  Service: Cardiovascular;  Laterality: N/A;   LEFT HEART CATHETERIZATION WITH CORONARY ANGIOGRAM N/A 01/16/2011   Procedure: LEFT HEART  CATHETERIZATION WITH CORONARY ANGIOGRAM;  Surgeon: Dorothye Gathers, MD;  Location: [facility] CATH LAB;  Service: Cardiovascular;  Laterality: N/A;   NOSE SURGERY     SINOSCOPY     Family History  Problem Relation Age of Onset   Hypertension Mother    Heart disease Mother    COPD Mother    Ulcers Mother    Lung cancer Father    Hypertension Sister    Diabetes Sister    Arthritis Sister    Depression Sister    Miscarriages / Stillbirths Sister    Early death Maternal Grandmother    Tuberculosis Maternal Grandmother    Lung cancer Maternal Grandfather    Suicidality Maternal Grandfather    Early death Maternal Grandfather    Heart failure Paternal Grandmother    Heart attack Paternal Grandmother    Hypertension Paternal Grandmother    Hyperlipidemia Paternal Grandmother    Heart disease Paternal Grandmother    Heart disease Paternal Grandfather    Hyperlipidemia Paternal Grandfather    Hypertension Paternal Grandfather    Hyperlipidemia Brother    Hypertension Brother    Intellectual disability Brother    Kidney disease Brother    Heart attack Brother    Asthma Brother    Early death Brother 6   Hyperlipidemia Brother    Hypertension Brother    Intellectual disability Brother    Kidney disease Brother    Heart disease Brother    Alcohol abuse Brother    Heart attack Other    Stroke Neg Hx    Breast cancer Neg Hx    Social History   Social History Narrative   Marital status/children/pets: Married.  One child.   Education/employment: Retired.  Associates degree in college.   Safety:      -Wears a bicycle helmet riding a bike: Yes     -smoke alarm in the home:Yes     - wears seatbelt: Yes     - Feels safe in their relationships: Yes    Allergies as of 07/01/2023       Reactions   Bupropion Nausea Only, Other (See Comments)   Nausea and headaches    Sulfa Antibiotics Other (See Comments)   From childhood- reaction not recalled   Prednisone  Anxiety, Other (See Comments)    "Made me not feel like myself, altered"  Medication List        Accurate as of July 01, 2023  9:07 AM. If you have any questions, ask your nurse or doctor.          STOP taking these medications    tirzepatide  5 MG/0.5ML Pen Commonly known as: MOUNJARO  Replaced by: tirzepatide  7.5 MG/0.5ML Pen Stopped by: Napolean Backbone       TAKE these medications    amLODipine  5 MG tablet Commonly known as: NORVASC  Take 1 tablet (5 mg total) by mouth daily.   aspirin  EC 81 MG tablet Take 81 mg by mouth daily.   budesonide  0.5 MG/2ML nebulizer solution Commonly known as: Pulmicort  Use twice a day in a sinus rinse bottle.   carvedilol  6.25 MG tablet Commonly known as: COREG  Take 1 tablet (6.25 mg total) by mouth 2 (two) times daily with a meal.   cetirizine  10 MG tablet Commonly known as: ZyrTEC  Allergy Take 1 tablet (10 mg total) by mouth daily.   clindamycin 1 % external solution Commonly known as: CLEOCIN T SMARTSIG:15 Milliliter(s) Topical Twice Daily PRN   cromolyn  4 % ophthalmic solution Commonly known as: OPTICROM  Place 1 drop into both eyes 4 (four) times daily as needed (itchy/watery eyes).   dicyclomine  10 MG capsule Commonly known as: BENTYL  TAKE 1 CAPSULE (10 MG TOTAL) BY MOUTH 4 TIMES A DAY BEFORE MEALS AND AT BEDTIME   estradiol  0.1 MG/GM vaginal cream Commonly known as: ESTRACE  Apply a pea-sized amount to finger tip and rub in vaginal and urethral area 3x weekly   isosorbide  mononitrate 60 MG 24 hr tablet Commonly known as: IMDUR  TAKE 1 TABLET BY MOUTH EVERY DAY   lisinopril -hydrochlorothiazide  20-12.5 MG tablet Commonly known as: ZESTORETIC  Take 1 tablet by mouth daily.   meloxicam  15 MG tablet Commonly known as: MOBIC  Take 1 tablet (15 mg total) by mouth daily.   montelukast  10 MG tablet Commonly known as: SINGULAIR  TAKE 1 TABLET BY MOUTH EVERYDAY AT BEDTIME   nitroGLYCERIN  0.4 MG SL tablet Commonly known as: NITROSTAT  Place 1  tablet (0.4 mg total) under the tongue every 5 (five) minutes as needed for chest pain.   rosuvastatin  20 MG tablet Commonly known as: CRESTOR  Take 1 tablet (20 mg total) by mouth daily.   sertraline  100 MG tablet Commonly known as: ZOLOFT  Take 1 tablet (100 mg total) by mouth at bedtime.   tirzepatide  7.5 MG/0.5ML Pen Commonly known as: MOUNJARO  Inject 7.5 mg into the skin once a week. Replaces: tirzepatide  5 MG/0.5ML Pen Started by: Napolean Backbone   triamcinolone  cream 0.1 % Commonly known as: KENALOG  SMARTSIG:sparingly Topical Twice Daily PRN   Vitamin D  (Ergocalciferol ) 1.25 MG (50000 UNIT) Caps capsule Commonly known as: DRISDOL  Take 1 capsule (50,000 Units total) by mouth every 7 (seven) days.        All past medical history, surgical history, allergies, family history, immunizations andmedications were updated in the EMR today and reviewed under the history and medication portions of their EMR.       ROS 14 pt review of systems performed and negative (unless mentioned in an HPI)  Objective: BP 126/76   Pulse 76   Temp 98.4 F (36.9 C)   Wt 191 lb (86.6 kg)   SpO2 98%   BMI 33.83 kg/m  Physical Exam Vitals and nursing note reviewed.  Constitutional:      General: She is not in acute distress.    Appearance: Normal appearance. She is not ill-appearing, toxic-appearing or diaphoretic.  HENT:     Head: Normocephalic and atraumatic.  Eyes:     General: No scleral icterus.       Right eye: No discharge.        Left eye: No discharge.     Extraocular Movements: Extraocular movements intact.     Conjunctiva/sclera: Conjunctivae normal.     Pupils: Pupils are equal, round, and reactive to light.  Cardiovascular:     Rate and Rhythm: Normal rate and regular rhythm.  Pulmonary:     Effort: Pulmonary effort is normal. No respiratory distress.     Breath sounds: Normal breath sounds. No wheezing, rhonchi or rales.  Musculoskeletal:     Right lower leg: No  edema.     Left lower leg: No edema.  Skin:    General: Skin is warm.     Findings: No rash.  Neurological:     Mental Status: She is alert and oriented to person, place, and time. Mental status is at baseline.     Motor: No weakness.     Gait: Gait normal.  Psychiatric:        Mood and Affect: Mood normal.        Behavior: Behavior normal.        Thought Content: Thought content normal.        Judgment: Judgment normal.     Diabetic Foot Exam - Simple   No data filed      No results found.  Assessment/plan: MERRY POND is a 68 y.o. female present for chronic condition manage Osteoarthritis Stable Continue Mobic  qd with food.  - continue  OTC voltaren gel/cream.  - discussed cherry tart extract and tumeric natural supplements and their SE potentials  - ANA, RF, CCP, ESR, CRP > WNL   Major depressive disorder, remission status unspecified, unspecified whether recurrent/insomnia Stable Continue Zoloft  100 mg daily. Discontinued Ambien  2021.   DCd trazodone  taper 25-75 mg nightly.> not helpful Dc vistaril > not helpful and felt bad on higher dose.  DC'd belsomra  > discontinued> expensive,  low-dose worked okay but not well.   Diabetes type 2: Stable Patient encouraged to continue exercise and dietary modifications.  Discontinued metformin  for now secondary to  diarrhea of unknown cause Increase mounjaro  5>7.5 mg weekly a1c improved today 5.7 (was 6.7)> 5.8> 6.6 > 5.9>5.5> 6.4 >5.5 collected today Eye exam up-to-date 12/11/2022  Foot exam completed 03/18/2023 - Microalbumin / creatinine urine ratio-03/2023  Essential hypertension/hyperlipidemia/Presence of coronary angioplasty implant and graft/Atherosclerosis of native coronary artery of native heart with other form of angina pectoris (HCC)/Intermediate coronary syndrome (HCC)/ Palpitations/Transient neurologic deficit/Morbid obesity (HCC) Stable Continue daily exercise. Continue low-sodium/heart healthy  diet. Continue amlodipine  5 mg daily Continue Coreg  6.25 mg twice daily Continue lisinopril -HCTZ 20-12.5 mg daily Continue  Crestor  20 mg daily Continue baby aspirin  daily  Allergic rhinitis, unspecified seasonality, unspecified trigger Continue antihistamine of choice.  OTC  Continue Singulair  Est with A&A   Osteopenia, unspecified location/Vitamin D  deficiency - Vitamin D  (25 hydroxy) UTD/WNL -UTD 07/2022 T-score -1.8 repeat 2 years  Return in about 15 weeks (around 10/14/2023) for Routine chronic condition follow-up.  Orders Placed This Encounter  Procedures   POCT HgB A1C   Meds ordered this encounter  Medications   amLODipine  (NORVASC ) 5 MG tablet    Sig: Take 1 tablet (5 mg total) by mouth daily.    Dispense:  90 tablet    Refill:  1   carvedilol  (COREG ) 6.25 MG tablet    Sig:  Take 1 tablet (6.25 mg total) by mouth 2 (two) times daily with a meal.    Dispense:  180 tablet    Refill:  1   lisinopril -hydrochlorothiazide  (ZESTORETIC ) 20-12.5 MG tablet    Sig: Take 1 tablet by mouth daily.    Dispense:  90 tablet    Refill:  1   meloxicam  (MOBIC ) 15 MG tablet    Sig: Take 1 tablet (15 mg total) by mouth daily.    Dispense:  90 tablet    Refill:  1   montelukast  (SINGULAIR ) 10 MG tablet    Sig: TAKE 1 TABLET BY MOUTH EVERYDAY AT BEDTIME    Dispense:  90 tablet    Refill:  1   sertraline  (ZOLOFT ) 100 MG tablet    Sig: Take 1 tablet (100 mg total) by mouth at bedtime.    Dispense:  90 tablet    Refill:  1   tirzepatide  (MOUNJARO ) 7.5 MG/0.5ML Pen    Sig: Inject 7.5 mg into the skin once a week.    Dispense:  6 mL    Refill:  1   Referral Orders  No referral(s) requested today      Electronically signed by: Napolean Backbone, DO [facility] [place]

## 2023-07-01 NOTE — Patient Instructions (Addendum)
 Return in about 15 weeks (around 10/14/2023) for Routine chronic condition follow-up.        Great to see you today.  I have refilled the medication(s) we provide.   If labs were collected or images ordered, we will inform you of  results once we have received them and reviewed. We will contact you either by echart message, or telephone call.  Please give ample time to the testing facility, and our office to run,  receive and review results. Please do not call inquiring of results, even if you can see them in your chart. We will contact you as soon as we are able. If it has been over 1 week since the test was completed, and you have not yet heard from us , then please call us .    - echart message- for normal results that have been seen by the patient already.   - telephone call: abnormal results or if patient has not viewed results in their echart.  If a referral to a specialist was entered for you, please call us  in 2 weeks if you have not heard from the specialist office to schedule.

## 2023-07-08 ENCOUNTER — Ambulatory Visit: Payer: Managed Care, Other (non HMO)

## 2023-07-09 ENCOUNTER — Ambulatory Visit (HOSPITAL_BASED_OUTPATIENT_CLINIC_OR_DEPARTMENT_OTHER): Payer: Self-pay | Admitting: Obstetrics & Gynecology

## 2023-07-09 DIAGNOSIS — N9489 Other specified conditions associated with female genital organs and menstrual cycle: Secondary | ICD-10-CM

## 2023-07-19 ENCOUNTER — Other Ambulatory Visit

## 2023-07-23 ENCOUNTER — Ambulatory Visit
Admission: RE | Admit: 2023-07-23 | Discharge: 2023-07-23 | Discharge status: Home / Self Care | Source: Ambulatory Visit | Attending: Family Medicine | Admitting: Family Medicine

## 2023-07-23 DIAGNOSIS — Z1231 Encounter for screening mammogram for malignant neoplasm of breast: Secondary | ICD-10-CM

## 2023-07-24 ENCOUNTER — Encounter (HOSPITAL_BASED_OUTPATIENT_CLINIC_OR_DEPARTMENT_OTHER): Payer: Self-pay | Admitting: Obstetrics & Gynecology

## 2023-07-25 ENCOUNTER — Ambulatory Visit: Payer: Self-pay | Admitting: Family Medicine

## 2023-07-26 ENCOUNTER — Other Ambulatory Visit

## 2023-07-26 DIAGNOSIS — Z006 Encounter for examination for normal comparison and control in clinical research program: Secondary | ICD-10-CM

## 2023-08-13 LAB — GENECONNECT MOLECULAR SCREEN: Genetic Analysis Overall Interpretation: NEGATIVE

## 2023-08-14 ENCOUNTER — Telehealth: Payer: Self-pay

## 2023-08-14 NOTE — Telephone Encounter (Signed)
 I called the patient to see if she's available for surgery with Dr. Cleotilde on 10/08/23 at 12 pm. The patient agreed to be scheduled. Pre-op  instructions and surgery details were provided by phone.

## 2023-09-03 ENCOUNTER — Other Ambulatory Visit (HOSPITAL_BASED_OUTPATIENT_CLINIC_OR_DEPARTMENT_OTHER): Payer: Self-pay | Admitting: Obstetrics & Gynecology

## 2023-09-03 DIAGNOSIS — Z01818 Encounter for other preprocedural examination: Secondary | ICD-10-CM

## 2023-09-03 DIAGNOSIS — N9489 Other specified conditions associated with female genital organs and menstrual cycle: Secondary | ICD-10-CM

## 2023-09-06 ENCOUNTER — Telehealth: Payer: Self-pay

## 2023-09-06 NOTE — Telephone Encounter (Signed)
 Hey Dr. Jeffrie,  Kristie Owens is requesting preoperative cardiac evaluation for a laparoscopic right salpingoophorectomy that is scheduled on October 08, 2023.  She has coronary artery disease status post LAD and RCA stents in 2021, hypertension, hyperlipidemia, T2DM,  you saw her last in the office on February 21st, 2025.  Please advise on aspirin  hold for upcoming surgery.  Thank you for your time, Kristie Shams, FNP-C    09/06/2023, 4:39 PM [facility] [address] 5[address] Office [phone] Fax [phone]

## 2023-09-06 NOTE — Telephone Encounter (Signed)
   Pre-operative Risk Assessment    Patient Name: Kristie Owens  DOB: Aug 06, 1955 MRN: [id number]   Date of last office visit: 03/22/23 ONEIL PARCHMENT, MD Date of next office visit: NONE   Request for Surgical Clearance    Procedure:  laparoscopic right salpingoophrectomy, possible LSO, washings  Date of Surgery:  Clearance 10/08/23                                Surgeon:  DR CLEOTILDE Socks Group or Practice Name:  [facility] Phone number:  [phone] Fax number:  [phone]   Type of Clearance Requested:   - Medical  - Pharmacy:  Hold Aspirin      Type of Anesthesia:  CHOICE   Additional requests/questions:    SignedLucie DELENA Ku   09/06/2023, 4:03 PM

## 2023-09-09 ENCOUNTER — Telehealth: Payer: Self-pay

## 2023-09-09 NOTE — Telephone Encounter (Signed)
   Name: Kristie Owens  DOB: 1955/03/08  MRN: [id number]  Primary Cardiologist: Oneil Parchment, MD   Preoperative team, please contact this patient and set up a phone call appointment for further preoperative risk assessment. Please obtain consent and complete medication review. Thank you for your help.  I confirm that guidance regarding antiplatelet and oral anticoagulation therapy has been completed and, if necessary, noted below.  Per Dr. Parchment, Okay to hold aspirin  for 5 days prior to surgery.   I also confirmed the patient resides in the state of [place] . As per [facility] Medical Board telemedicine laws, the patient must reside in the state in which the provider is licensed.  Ion Gonnella D Jayson Waterhouse, NP 09/09/2023, 2:37 PM [facility]

## 2023-09-09 NOTE — Telephone Encounter (Signed)
 med rec and consent done     Patient Consent for Virtual Visit        Kristie Owens has provided verbal consent on 09/09/2023 for a virtual visit (video or telephone).   CONSENT FOR VIRTUAL VISIT FOR:  Kristie Owens  By participating in this virtual visit I agree to the following:  I hereby voluntarily request, consent and authorize [facility] and its employed or contracted physicians, physician assistants, nurse practitioners or other licensed health care professionals (the Practitioner), to provide me with telemedicine health care services (the "Services) as deemed necessary by the treating Practitioner. I acknowledge and consent to receive the Services by the Practitioner via telemedicine. I understand that the telemedicine visit will involve communicating with the Practitioner through live audiovisual communication technology and the disclosure of certain medical information by electronic transmission. I acknowledge that I have been given the opportunity to request an in-person assessment or other available alternative prior to the telemedicine visit and am voluntarily participating in the telemedicine visit.  I understand that I have the right to withhold or withdraw my consent to the use of telemedicine in the course of my care at any time, without affecting my right to future care or treatment, and that the Practitioner or I may terminate the telemedicine visit at any time. I understand that I have the right to inspect all information obtained and/or recorded in the course of the telemedicine visit and may receive copies of available information for a reasonable fee.  I understand that some of the potential risks of receiving the Services via telemedicine include:  Delay or interruption in medical evaluation due to technological equipment failure or disruption; Information transmitted may not be sufficient (e.g. poor resolution of images) to allow for appropriate medical  decision making by the Practitioner; and/or  In rare instances, security protocols could fail, causing a breach of personal health information.  Furthermore, I acknowledge that it is my responsibility to provide information about my medical history, conditions and care that is complete and accurate to the best of my ability. I acknowledge that Practitioner's advice, recommendations, and/or decision may be based on factors not within their control, such as incomplete or inaccurate data provided by me or distortions of diagnostic images or specimens that may result from electronic transmissions. I understand that the practice of medicine is not an exact science and that Practitioner makes no warranties or guarantees regarding treatment outcomes. I acknowledge that a copy of this consent can be made available to me via my patient portal [facility] MyChart), or I can request a printed copy by calling the office of [facility].    I understand that my insurance will be billed for this visit.   I have read or had this consent read to me. I understand the contents of this consent, which adequately explains the benefits and risks of the Services being provided via telemedicine.  I have been provided ample opportunity to ask questions regarding this consent and the Services and have had my questions answered to my satisfaction. I give my informed consent for the services to be provided through the use of telemedicine in my medical care

## 2023-09-09 NOTE — Telephone Encounter (Signed)
 Spoke with pt and scheduled TELE preop appt 09/18/23. med rec and consent done

## 2023-09-18 ENCOUNTER — Ambulatory Visit: Attending: Cardiology | Admitting: Emergency Medicine

## 2023-09-18 DIAGNOSIS — Z01810 Encounter for preprocedural cardiovascular examination: Secondary | ICD-10-CM | POA: Diagnosis not present

## 2023-09-18 NOTE — Progress Notes (Signed)
 Virtual Visit via Telephone Note   Because of Kristie Owens co-morbid illnesses, she is at least at moderate risk for complications without adequate follow up.  This format is felt to be most appropriate for this patient at this time.  Due to technical limitations with video connection (technology), today's appointment will be conducted as an audio only telehealth visit, and Kristie Owens verbally agreed to proceed in this manner.   All issues noted in this document were discussed and addressed.  No physical exam could be performed with this format.  Evaluation Performed:  Preoperative cardiovascular risk assessment _____________   Date:  09/18/2023   Patient ID:  Kristie Owens, DOB 25-Nov-1955, MRN [id number] Patient Location:  Home Provider location:   Office  Primary Care Provider:  Catherine Charlies LABOR, DO Primary Cardiologist:  Oneil Parchment, MD  Chief Complaint / Patient Profile   68 y.o. y/o female with a h/o coronary artery disease, longstanding chest discomfort, hypertension, hyperlipidemia, type 2 diabetes who is pending laparoscopic right salpingo-oophorectomy, possible LSO, washings on 10/08/2023 with Dr. Nancey of [facility] for [facility] and presents today for telephonic preoperative cardiovascular risk assessment.  History of Present Illness    Kristie Owens is a 68 y.o. female who presents via audio/video conferencing for a telehealth visit today.  Pt was last seen in cardiology clinic on 03/22/2023 by Dr. Parchment.  At that time Kristie Owens was doing well.  The patient is now pending procedure as outlined above. Since her last visit, she denies shortness of breath, lower extremity edema, fatigue, palpitations, melena, hematuria, hemoptysis, diaphoresis, weakness, presyncope, syncope, orthopnea, and PND.  Today patient tells me she is doing well overall.  She is without any new cardiovascular concerns or complaints.  She continues to  experience longstanding intermittent chest discomfort that is well-managed on isosorbide , carvedilol , and amlodipine .  She reports no changes in her chest discomfort over several years.  She still stays active and does deny exertional symptoms.  She volunteers regularly where she will move heavy objects and exert herself.  She also does yard work and cleans her pool without anginal symptoms.  Overall she feels like she is doing very well with no changes.  Overall she is able to complete greater than 4 METS.  Past Medical History    Past Medical History:  Diagnosis Date   Achilles bursitis or tendinitis 11/11/2012   right    Allergy    Angina at rest [facility])    Dr Parchment   CAD (coronary artery disease), native coronary artery    DES to LAD in 2 areas, one at the bifurcation of a diagonal branch and also in the mid to distal region. Her diagonal branch was also ballooned . She has a DES to RCA 10/10. Her circumflex artery mild ostial disease.   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    Tonsillar calculus 08/12/2018  Transient neurologic deficit 07/02/2018   Tremor 03/23/2020   Past Surgical History:  Procedure Laterality Date   BREAST EXCISIONAL BIOPSY Right 1995   CAROTID STENT     CESAREAN SECTION  1983   FOOT SURGERY     LEFT HEART CATH AND CORONARY ANGIOGRAPHY N/A 06/04/2016   Procedure: Left Heart Cath and Coronary Angiography;  Surgeon: Claudene Victory ORN, MD;  [facility]VAS[facility] CV LAB;  Service: Cardiovascular;  Laterality: N/A;   LEFT HEART CATH AND CORONARY  ANGIOGRAPHY N/A 12/04/2019   Procedure: LEFT HEART CATH AND CORONARY ANGIOGRAPHY;  Surgeon: Verlin Lonni BIRCH, MD;  Location: [facility] INVASIVE CV LAB;  Service: Cardiovascular;  Laterality: N/A;   LEFT HEART CATHETERIZATION WITH CORONARY ANGIOGRAM N/A 01/16/2011   Procedure: LEFT HEART CATHETERIZATION WITH CORONARY ANGIOGRAM;  Surgeon: Oneil Parchment, MD;  Location: [facility] CATH LAB;  Service: Cardiovascular;  Laterality: N/A;   NOSE SURGERY     SINOSCOPY      Allergies  Allergies  Allergen Reactions   Bupropion Nausea Only and Other (See Comments)    Nausea and headaches    Sulfa Antibiotics Other (See Comments)    From childhood- reaction not recalled   Prednisone  Anxiety and Other (See Comments)    Made me not feel like myself, altered    Home Medications    Prior to Admission medications   Medication Sig Start Date End Date Taking? Authorizing Provider  amLODipine  (NORVASC ) 5 MG tablet Take 1 tablet (5 mg total) by mouth daily. 07/01/23 12/28/23  Kuneff, Renee A, DO  aspirin  EC 81 MG tablet Take 81 mg by mouth daily.    [provider]  budesonide  (PULMICORT ) 0.5 MG/2ML nebulizer solution Use twice a day in a sinus rinse bottle. 07/13/21   Luke Orlan HERO, DO  carvedilol  (COREG ) 6.25 MG tablet Take 1 tablet (6.25 mg total) by mouth 2 (two) times daily with a meal. 07/01/23   Kuneff, Renee A, DO  cetirizine  (ZYRTEC  ALLERGY) 10 MG tablet Take 1 tablet (10 mg total) by mouth daily. 09/08/20   Luke Orlan HERO, DO  clindamycin (CLEOCIN T) 1 % external solution SMARTSIG:15 Milliliter(s) Topical Twice Daily PRN 06/18/23   [provider]  cromolyn  (OPTICROM ) 4 % ophthalmic solution Place 1 drop into both eyes 4 (four) times daily as needed (itchy/watery eyes). 07/13/21   Luke Orlan HERO, DO  dicyclomine  (BENTYL ) 10 MG capsule TAKE 1 CAPSULE (10 MG TOTAL) BY MOUTH 4 TIMES A DAY BEFORE MEALS AND AT BEDTIME 09/18/22   Kuneff, Renee A, DO  estradiol  (ESTRACE ) 0.1 MG/GM vaginal cream Apply a pea-sized  amount to finger tip and rub in vaginal and urethral area 3x weekly 09/13/22   Shona Layman BROCKS, MD  isosorbide  mononitrate (IMDUR ) 60 MG 24 hr tablet TAKE 1 TABLET BY MOUTH EVERY DAY 06/03/23   Parchment Oneil BROCKS, MD  lisinopril -hydrochlorothiazide  (ZESTORETIC ) 20-12.5 MG tablet Take 1 tablet by mouth daily. 07/01/23   Kuneff, Renee A, DO  meloxicam  (MOBIC ) 15 MG tablet Take 1 tablet (15 mg total) by mouth daily. 07/01/23   Kuneff, Renee A, DO  montelukast  (SINGULAIR ) 10 MG tablet TAKE 1 TABLET BY MOUTH EVERYDAY AT BEDTIME 07/01/23   Kuneff, Renee A, DO  nitroGLYCERIN  (NITROSTAT ) 0.4 MG SL tablet Place 1 tablet (0.4 mg total) under the tongue every 5 (five) minutes as needed for chest pain. Patient not taking: Reported on 09/09/2023 11/17/19   Marylu Leita SAUNDERS, NP  rosuvastatin  (CRESTOR ) 20 MG tablet Take 1 tablet (20  mg total) by mouth daily. 03/18/23   Kuneff, Renee A, DO  sertraline  (ZOLOFT ) 100 MG tablet Take 1 tablet (100 mg total) by mouth at bedtime. 07/01/23   Kuneff, Renee A, DO  tirzepatide  (MOUNJARO ) 7.5 MG/0.5ML Pen Inject 7.5 mg into the skin once a week. 07/01/23   Catherine, Renee A, DO  triamcinolone  cream (KENALOG ) 0.1 % SMARTSIG:sparingly Topical Twice Daily PRN 04/15/23   [provider]  Vitamin D , Ergocalciferol , (DRISDOL ) 1.25 MG (50000 UNIT) CAPS capsule Take 1 capsule (50,000 Units total) by mouth every 7 (seven) days. 03/18/23   Catherine Charlies LABOR, DO    Physical Exam    Vital Signs:  Kristie Owens does not have vital signs available for review today.  Given telephonic nature of communication, physical exam is limited. AAOx3. NAD. Normal affect.  Speech and respirations are unlabored.  Accessory Clinical Findings    None  Assessment & Plan    1.  Preoperative Cardiovascular Risk Assessment: According to the Revised Cardiac Risk Index (RCRI), her Perioperative Risk of Major Cardiac Event is (%): 0.9. Her Functional Capacity in METs is: 6.61 according to the [facility] Activity Status  Index (DASI). Therefore, based on ACC/AHA guidelines, patient would be at acceptable risk for the planned procedure without further cardiovascular testing.  The patient was advised that if she develops new symptoms prior to surgery to contact our office to arrange for a follow-up visit, and she verbalized understanding.  Per Dr. Jeffrie, Okay to hold aspirin  for 5 days prior to surgery.   A copy of this note will be routed to requesting surgeon.  Time:   Today, I have spent 10 minutes with the patient with telehealth technology discussing medical history, symptoms, and management plan.     Kristie LITTIE Louis, NP  09/18/2023, 8:54 AM

## 2023-09-23 ENCOUNTER — Ambulatory Visit (INDEPENDENT_AMBULATORY_CARE_PROVIDER_SITE_OTHER): Admitting: Obstetrics & Gynecology

## 2023-09-23 ENCOUNTER — Encounter (HOSPITAL_BASED_OUTPATIENT_CLINIC_OR_DEPARTMENT_OTHER): Payer: Self-pay | Admitting: Obstetrics & Gynecology

## 2023-09-23 VITALS — BP 126/75 | HR 72 | Ht 63.0 in | Wt 194.0 lb

## 2023-09-23 DIAGNOSIS — Z955 Presence of coronary angioplasty implant and graft: Secondary | ICD-10-CM

## 2023-09-23 DIAGNOSIS — Z01818 Encounter for other preprocedural examination: Secondary | ICD-10-CM

## 2023-09-23 DIAGNOSIS — E1169 Type 2 diabetes mellitus with other specified complication: Secondary | ICD-10-CM

## 2023-09-23 DIAGNOSIS — D4959 Neoplasm of unspecified behavior of other genitourinary organ: Secondary | ICD-10-CM

## 2023-09-23 DIAGNOSIS — E785 Hyperlipidemia, unspecified: Secondary | ICD-10-CM

## 2023-09-24 ENCOUNTER — Ambulatory Visit
Admission: EM | Admit: 2023-09-24 | Discharge: 2023-09-24 | Disposition: A | Discharge status: Home / Self Care | Attending: Nurse Practitioner | Admitting: Nurse Practitioner

## 2023-09-24 ENCOUNTER — Ambulatory Visit: Payer: Self-pay

## 2023-09-24 DIAGNOSIS — N3001 Acute cystitis with hematuria: Secondary | ICD-10-CM | POA: Insufficient documentation

## 2023-09-24 DIAGNOSIS — Z87440 Personal history of urinary (tract) infections: Secondary | ICD-10-CM | POA: Insufficient documentation

## 2023-09-24 LAB — POCT URINE DIPSTICK
Glucose, UA: 250 mg/dL — AB
Nitrite, UA: POSITIVE — AB
POC PROTEIN,UA: >=300 — AB
Spec Grav, UA: 1.015 (ref 1.010–1.025)
Urobilinogen, UA: 4 U/dL — AB
pH, UA: 5 (ref 5.0–8.0)

## 2023-09-24 MED ORDER — CEPHALEXIN 500 MG PO CAPS: 500.0000 mg | ORAL_CAPSULE | Freq: Four times a day (QID) | ORAL | 0 refills | Status: DC

## 2023-09-24 NOTE — Telephone Encounter (Signed)
 FYI Only or Action Required?: FYI only for provider.  Patient was last seen in primary care on 07/01/2023 by Kristie Fuller Owens, Kristie Owens.  Called Nurse Triage reporting Dysuria.  Symptoms began yesterday.  Interventions attempted: OTC medications: Azo.  Symptoms are: gradually worsening.  Triage Disposition: See HCP Within 4 Hours (Or PCP Triage)  Patient/caregiver understands and will follow disposition?: Yes     Copied from CRM #[id number]. Topic: Clinical - Red Word Triage >> Sep 24, 2023  8:08 AM Suzen RAMAN wrote: Red Word that prompted transfer to Nurse Triage: painful urination and blood in urine. Possible UTI per patient. Seeking an appt with PCP Reason for Disposition  [1] SEVERE pain with urination (e.g., excruciating) AND [2] not improved after 2 hours of pain medicine  Answer Assessment - Initial Assessment Questions 1. SEVERITY: How bad is the pain?  (e.g., Scale 1-10; mild, moderate, or severe)     10 when trying to urinate, patient states it feels like piercing pain 3. PATTERN: Is pain present every time you urinate or just sometimes?      Yes and constant pressure 4. ONSET: When did the painful urination start?      Last night  5. FEVER: Kristie Owens you have Owens fever? If Yes, ask: What is your temperature, how was it measured, and when did it start?     Unsure but she has had chills all night 6. PAST UTI: Have you had Owens urine infection before? If Yes, ask: When was the last time? and What happened that time?      Yes but not this severe 7. CAUSE: What Kristie Owens you think is causing the painful urination?  (e.g., UTI, scratch, Herpes sore)     Patient believes it is UTI 8. OTHER SYMPTOMS: Kristie Owens you have any other symptoms? (e.g., blood in urine, flank pain, genital sores, urgency, vaginal discharge)     Urgency, blood in urine, right hip/flank pain  Protocols used: Urination Pain - Female-Owens-AH

## 2023-09-24 NOTE — ED Provider Notes (Signed)
 [facility]    CSN: [id number] Arrival date & time: 09/24/23  1002      History   Chief Complaint Chief Complaint  Patient presents with   Dysuria    HPI Kristie Owens is a 68 y.o. female.   The history is provided by the patient.   Patient presents for complaints of pain with urination, dysuria, and suprapubic pressure.  Patient states symptoms started over the course of the night.  She also states that she experienced chills.  She denies fever, chest pain, abdominal pain, nausea, vomiting, diarrhea, flank pain, urinary urgency, frequency, decreased urine stream, or vaginal symptoms.  Patient states that she does have a history of lesions on her kidneys, she has seen urology in the past.  Patient states that she also has an upcoming surgery for a oophorectomy next month.  Patient states she has been taking Azo for her symptoms which usually helps.  States her last UTI was 1 to 2 years ago.  Patient reports history of recurrent UTIs.  Past Medical History:  Diagnosis Date   Achilles bursitis or tendinitis 11/11/2012   right    Allergy    Angina at rest [facility]    Dr Jeffrie   CAD (coronary artery disease), native coronary artery    DES to LAD in 2 areas, one at the bifurcation of a diagonal branch and also in the mid to distal region. Her diagonal branch was also ballooned . She has a DES to RCA 10/10. Her circumflex artery mild ostial disease.   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   Lesion of native kidney bilateral-benign cysts 08/15/2022   Osteopenia 03/13/2021   Seasonal and perennial allergic rhinoconjunctivitis 03/31/2020   Obstructive sleep apnea syndrome 03/23/2020   Recurrent major depression (HCC) 03/23/2020   Type 2 diabetes mellitus with hyperlipidemia (HCC) 03/23/2020   Arthritis 07/27/2019   E66.01 (HCC) 06/11/2019   IBS (irritable bowel syndrome) 04/08/2016   Presence of coronary angioplasty implant and graft 04/08/2016   Palpitations 12/15/2013   Essential hypertension 12/15/2013   Coronary atherosclerosis of native coronary artery 01/16/2011   Intermediate coronary syndrome (HCC) 01/16/2011    Past Surgical History:  Procedure Laterality Date   BREAST EXCISIONAL BIOPSY Right 1995   and in 1996   CAROTID STENT  2010   3 done wtithin a few weeks   CESAREAN SECTION  1983   FOOT SURGERY     LEFT HEART CATH AND CORONARY ANGIOGRAPHY N/A 06/04/2016   Procedure: Left Heart Cath and Coronary Angiography;  Surgeon: Claudene Victory ORN, MD;  Location: [facility] INVASIVE CV LAB;  Service: Cardiovascular;  Laterality: N/A;   LEFT HEART  CATH AND CORONARY ANGIOGRAPHY N/A 12/04/2019   Procedure: LEFT HEART CATH AND CORONARY ANGIOGRAPHY;  Surgeon: Verlin Lonni BIRCH, MD;  Location: [facility] INVASIVE CV LAB;  Service: Cardiovascular;  Laterality: N/A;   LEFT HEART CATHETERIZATION WITH CORONARY ANGIOGRAM N/A 01/16/2011   Procedure: LEFT HEART CATHETERIZATION WITH CORONARY ANGIOGRAM;  Surgeon: Oneil Parchment, MD;  Location: [facility] CATH LAB;  Service: Cardiovascular;  Laterality: N/A;   NASAL SEPTUM SURGERY  2000   SINOSCOPY      OB History     Gravida  1   Para  1   Term      Preterm      AB      Living  1      SAB      IAB      Ectopic      Multiple       Live Births               Home Medications    Prior to Admission medications   Medication Sig Start Date End Date Taking? Authorizing Provider  cephALEXin  (KEFLEX ) 500 MG capsule Take 1 capsule (500 mg total) by mouth 4 (four) times daily. 09/24/23  Yes Leath-Warren, Etta PARAS, NP  amLODipine  (NORVASC ) 5 MG tablet Take 1 tablet (5 mg total) by mouth daily. 07/01/23 12/28/23  Kuneff, Renee A, DO  aspirin  EC 81 MG tablet Take 81 mg by mouth daily.    [provider]  budesonide  (PULMICORT ) 0.5 MG/2ML nebulizer solution Use twice a day in a sinus rinse bottle. 07/13/21   Luke Orlan HERO, DO  carvedilol  (COREG ) 6.25 MG tablet Take 1 tablet (6.25 mg total) by mouth 2 (two) times daily with a meal. 07/01/23   Kuneff, Renee A, DO  cetirizine  (ZYRTEC  ALLERGY) 10 MG tablet Take 1 tablet (10 mg total) by mouth daily. 09/08/20   Luke Orlan HERO, DO  clindamycin (CLEOCIN T) 1 % external solution SMARTSIG:15 Milliliter(s) Topical Twice Daily PRN 06/18/23   [provider]  cromolyn  (OPTICROM ) 4 % ophthalmic solution Place 1 drop into both eyes 4 (four) times daily as needed (itchy/watery eyes). 07/13/21   Luke Orlan HERO, DO  dicyclomine  (BENTYL ) 10 MG capsule TAKE 1 CAPSULE (10 MG TOTAL) BY MOUTH 4 TIMES A DAY BEFORE MEALS AND AT BEDTIME 09/18/22   Kuneff, Renee A, DO  estradiol  (ESTRACE ) 0.1 MG/GM vaginal cream Apply a pea-sized amount to finger tip and rub in vaginal and urethral area 3x weekly 09/13/22   Shona Layman BROCKS, MD  isosorbide  mononitrate (IMDUR ) 60 MG 24 hr tablet TAKE 1 TABLET BY MOUTH EVERY DAY 06/03/23   Parchment Oneil BROCKS, MD  lisinopril -hydrochlorothiazide  (ZESTORETIC ) 20-12.5 MG tablet Take 1 tablet by mouth daily. 07/01/23   Kuneff, Renee A, DO  meloxicam  (MOBIC ) 15 MG tablet Take 1 tablet (15 mg total) by mouth daily. 07/01/23   Kuneff, Renee A, DO  montelukast  (SINGULAIR ) 10 MG tablet TAKE 1 TABLET BY MOUTH EVERYDAY AT BEDTIME 07/01/23   Kuneff, Renee A, DO  nitroGLYCERIN  (NITROSTAT ) 0.4 MG  SL tablet Place 1 tablet (0.4 mg total) under the tongue every 5 (five) minutes as needed for chest pain. 11/17/19   Marylu Leita SAUNDERS, NP  rosuvastatin  (CRESTOR ) 20 MG tablet Take 1 tablet (20 mg total) by mouth daily. 03/18/23   Kuneff, Renee A, DO  sertraline  (ZOLOFT ) 100 MG tablet Take 1 tablet (100 mg total) by mouth at bedtime. 07/01/23   Kuneff, Renee A, DO  tirzepatide  (MOUNJARO ) 7.5  MG/0.5ML Pen Inject 7.5 mg into the skin once a week. 07/01/23   Catherine, Renee A, DO  triamcinolone  cream (KENALOG ) 0.1 % SMARTSIG:sparingly Topical Twice Daily PRN 04/15/23   [provider]  Vitamin D , Ergocalciferol , (DRISDOL ) 1.25 MG (50000 UNIT) CAPS capsule Take 1 capsule (50,000 Units total) by mouth every 7 (seven) days. 03/18/23   Catherine Charlies LABOR, DO    Family History Family History  Problem Relation Age of Onset   Hypertension Mother    Heart disease Mother    COPD Mother    Ulcers Mother    Lung cancer Father    Hypertension Sister    Diabetes Sister    Arthritis Sister    Depression Sister    Miscarriages / Stillbirths Sister    Early death Maternal Grandmother    Tuberculosis Maternal Grandmother    Lung cancer Maternal Grandfather    Suicidality Maternal Grandfather    Early death Maternal Grandfather    Heart failure Paternal Grandmother    Heart attack Paternal Grandmother    Hypertension Paternal Grandmother    Hyperlipidemia Paternal Grandmother    Heart disease Paternal Grandmother    Heart disease Paternal Grandfather    Hyperlipidemia Paternal Grandfather    Hypertension Paternal Grandfather    Hyperlipidemia Brother    Hypertension Brother    Intellectual disability Brother    Kidney disease Brother    Heart attack Brother    Asthma Brother    Early death Brother 35   Hyperlipidemia Brother    Hypertension Brother    Intellectual disability Brother    Kidney disease Brother    Heart disease Brother    Alcohol abuse Brother    Heart attack Other    Stroke Neg  Hx    Breast cancer Neg Hx     Social History Social History   Tobacco Use   Smoking status: Never   Smokeless tobacco: Never  Vaping Use   Vaping status: Never Used  Substance Use Topics   Alcohol use: Not Currently   Drug use: Never     Allergies   Bupropion, Sulfa antibiotics, and Prednisone    Review of Systems Review of Systems Per HPI  Physical Exam Triage Vital Signs ED Triage Vitals  Encounter Vitals Group     BP 09/24/23 1011 (!) 152/77     Girls Systolic BP Percentile --      Girls Diastolic BP Percentile --      Boys Systolic BP Percentile --      Boys Diastolic BP Percentile --      Pulse Rate 09/24/23 1011 89     Resp 09/24/23 1011 16     Temp 09/24/23 1011 98.6 F (37 C)     Temp Source 09/24/23 1011 Oral     SpO2 09/24/23 1011 96 %     Weight --      Height --      Head Circumference --      Peak Flow --      Pain Score 09/24/23 1012 0     Pain Loc --      Pain Education --      Exclude from Growth Chart --    No data found.  Updated Vital Signs BP (!) 152/77 (BP Location: Left Arm)   Pulse 89   Temp 98.6 F (37 C) (Oral)   Resp 16   SpO2 96%   Visual Acuity Right Eye Distance:   Left Eye Distance:  Bilateral Distance:    Right Eye Near:   Left Eye Near:    Bilateral Near:     Physical Exam Vitals and nursing note reviewed.  Constitutional:      General: She is not in acute distress.    Appearance: Normal appearance.  HENT:     Head: Normocephalic.  Eyes:     Extraocular Movements: Extraocular movements intact.     Pupils: Pupils are equal, round, and reactive to light.  Cardiovascular:     Rate and Rhythm: Normal rate and regular rhythm.     Pulses: Normal pulses.     Heart sounds: Normal heart sounds.  Pulmonary:     Effort: Pulmonary effort is normal. No respiratory distress.     Breath sounds: Normal breath sounds. No stridor. No wheezing, rhonchi or rales.  Abdominal:     General: Bowel sounds are normal.      Palpations: Abdomen is soft.     Tenderness: There is abdominal tenderness in the suprapubic area. There is no right CVA tenderness or left CVA tenderness.  Musculoskeletal:     Cervical back: Normal range of motion.  Skin:    General: Skin is warm and dry.  Neurological:     General: No focal deficit present.     Mental Status: She is alert and oriented to person, place, and time.  Psychiatric:        Mood and Affect: Mood normal.        Behavior: Behavior normal.      UC Treatments / Results  Labs (all labs ordered are listed, but only abnormal results are displayed) Labs Reviewed  POCT URINE DIPSTICK - Abnormal; Notable for the following components:      Result Value   Color, UA orange (*)    Glucose, UA =250 (*)    Bilirubin, UA small (*)    Ketones, POC UA trace (5) (*)    Blood, UA large (*)    POC PROTEIN,UA >=300 (*)    Urobilinogen, UA 4.0 (*)    Nitrite, UA Positive (*)    Leukocytes, UA Large (3+) (*)    All other components within normal limits    EKG   Radiology No results found.  Procedures Procedures (including critical care time)  Medications Ordered in UC Medications - No data to display  Initial Impression / Assessment and Plan / UC Course  I have reviewed the triage vital signs and the nursing notes.  Pertinent labs & imaging results that were available during my care of the patient were reviewed by me and considered in my medical decision making (see chart for details).  Urinalysis is abnormal, patient has been taking Azo for her symptoms which most likely has skewed the results.  However, given the patient's current symptoms, we will treat empirically for acute cystitis with hematuria with Keflex  500 mg while the urine culture is pending.  Supportive care recommendations were provided and discussed with the patient to include fluids, over-the-counter Tylenol , developing a toileting schedule, and avoiding caffeine.  Patient was advised that if  the results of the culture are negative, recommend follow-up with urology for further evaluation, also recommend follow-up with urology if UTI symptoms continue to recur.  Discussed strict ER follow-up precautions with the patient.  Patient was in agreement with this plan of care and verbalizes understanding.  All questions were answered.  Patient stable for discharge.   Final Clinical Impressions(s) / UC Diagnoses   Final diagnoses:  Acute  cystitis with hematuria  History of recurrent UTI (urinary tract infection)     Discharge Instructions      Urine culture has been ordered.  You will be contacted when the results of the culture are received.  You will also have access to the results via MyChart. Take medication as prescribed. You may take over-the-counter Tylenol  as needed for pain, fever, or general discomfort. Continue to drink plenty of fluids.  Try to drink at least 8-10 8 ounce glasses of water daily while symptoms persist. Develop a toileting schedule that will allow you to urinate at least every 2 hours. Avoid caffeine such as tea, soda, or coffee while symptoms persist. As discussed, if the results of the urine culture are negative and you are continuing to experience symptoms, recommend follow-up with urology for further evaluation.  Also recommend follow-up with urology if UTI symptoms continue to recur. Go to the emergency department if you experience worsening urinary symptoms with new symptoms of fever, abdominal pain, nausea, flank pain, or other concerns. Follow-up as needed.     ED Prescriptions     Medication Sig Dispense Auth. Provider   cephALEXin  (KEFLEX ) 500 MG capsule Take 1 capsule (500 mg total) by mouth 4 (four) times daily. 28 capsule Leath-Warren, Etta PARAS, NP      PDMP not reviewed this encounter.   Gilmer Etta PARAS, NP 09/24/23 1032

## 2023-09-24 NOTE — Discharge Instructions (Signed)
 Urine culture has been ordered.  You will be contacted when the results of the culture are received.  You will also have access to the results via MyChart. Take medication as prescribed. You may take over-the-counter Tylenol  as needed for pain, fever, or general discomfort. Continue to drink plenty of fluids.  Try to drink at least 8-10 8 ounce glasses of water daily while symptoms persist. Develop a toileting schedule that will allow you to urinate at least every 2 hours. Avoid caffeine such as tea, soda, or coffee while symptoms persist. As discussed, if the results of the urine culture are negative and you are continuing to experience symptoms, recommend follow-up with urology for further evaluation.  Also recommend follow-up with urology if UTI symptoms continue to recur. Go to the emergency department if you experience worsening urinary symptoms with new symptoms of fever, abdominal pain, nausea, flank pain, or other concerns. Follow-up as needed.

## 2023-09-24 NOTE — ED Triage Notes (Signed)
 Pt states pain with urination and blood in her urine since last night.  States she took 3 AZO pills at home for the pain.

## 2023-09-25 ENCOUNTER — Ambulatory Visit: Payer: Self-pay

## 2023-09-25 LAB — URINE CULTURE: Culture: NO GROWTH

## 2023-09-26 NOTE — Progress Notes (Signed)
 68 y.o. G1P1 Married White or Caucasian female here for discussion of upcoming procedure.  Laparoscopic BSO planned due to 7.5cm left adnexal cyst that is causing pain.  She has undergone evaluation with ultrasound, MRI and Ca-125 04/11/2023 with results of 7.1   This has increased from 4 to 7.3cm.  this was initially seen with CT 07/2022 and was read as intraperitoneal but subsequent MRI showed this lesion was extraperitoneal.     Pt does have diabetes and most recent hba1c was 5.5.  Procedure discussed with patient.  Hospital stay, recovery and pain management all discussed.  Risks discussed including but not limited to bleeding, <1% risk of receiving a  transfusion, infection, 1-2% risk of bowel/bladder/ureteral/vascular injury discussed as well as possible need for additional surgery if injury does occur discussed.  DVT/PE and rare risk of death discussed.  My actual complications with prior surgeries discussed.  Hernia formation discussed.  Positioning and incision locations discussed.  Patient aware if pathology abnormal she may need additional treatment.  All questions answered.     Ob Hx:   No LMP recorded. Patient is postmenopausal.          Sexually active: No.  Last pap: 04/11/2023 ASCUS with Neg HR HPV, colposcopy has been completed as well.   Last MMG: 07/23/2023 Smoking: No   Past Surgical History:  Procedure Laterality Date   BREAST EXCISIONAL BIOPSY Right 1995   and in 1996   CAROTID STENT  2010   3 done wtithin a few weeks   CESAREAN SECTION  1983   FOOT SURGERY     LEFT HEART CATH AND CORONARY ANGIOGRAPHY N/A 06/04/2016   Procedure: Left Heart Cath and Coronary Angiography;  Surgeon: Claudene Victory ORN, MD;  Location: [facility] INVASIVE CV LAB;  Service: Cardiovascular;  Laterality: N/A;   LEFT HEART CATH AND CORONARY ANGIOGRAPHY N/A 12/04/2019   Procedure: LEFT HEART CATH AND CORONARY ANGIOGRAPHY;  Surgeon: Verlin Lonni BIRCH, MD;  Location: [facility] INVASIVE CV LAB;  Service:  Cardiovascular;  Laterality: N/A;   LEFT HEART CATHETERIZATION WITH CORONARY ANGIOGRAM N/A 01/16/2011   Procedure: LEFT HEART CATHETERIZATION WITH CORONARY ANGIOGRAM;  Surgeon: Oneil Parchment, MD;  Location: [facility] CATH LAB;  Service: Cardiovascular;  Laterality: N/A;   NASAL SEPTUM SURGERY  2000   SINOSCOPY      Past Medical History:  Diagnosis Date   Achilles bursitis or tendinitis 11/11/2012   right    Allergy    Angina at rest [facility])    Dr Parchment   CAD (coronary artery disease), native coronary artery    DES to LAD in 2 areas, one at the bifurcation of a diagonal branch and also in the mid to distal region. Her diagonal branch was also ballooned . She has a DES to RCA 10/10. Her circumflex artery mild ostial disease.   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  Tremor 03/23/2020    Allergies: Bupropion, Sulfa antibiotics, and Prednisone   Current Outpatient Medications  Medication Sig Dispense Refill   amLODipine  (NORVASC ) 5 MG tablet Take 1 tablet (5 mg total) by mouth daily. 90 tablet 1   aspirin  EC 81 MG tablet Take 81 mg by mouth daily.     budesonide  (PULMICORT ) 0.5 MG/2ML nebulizer solution Use twice a day in a sinus rinse bottle. 120 mL 2   carvedilol  (COREG ) 6.25 MG tablet Take 1 tablet (6.25 mg total) by mouth 2 (two) times daily with a meal. 180 tablet 1   cetirizine  (ZYRTEC  ALLERGY) 10 MG tablet Take 1 tablet (10 mg total) by mouth daily. 30 tablet 5   clindamycin (CLEOCIN T) 1 %  external solution SMARTSIG:15 Milliliter(s) Topical Twice Daily PRN     cromolyn  (OPTICROM ) 4 % ophthalmic solution Place 1 drop into both eyes 4 (four) times daily as needed (itchy/watery eyes). 10 mL 5   dicyclomine  (BENTYL ) 10 MG capsule TAKE 1 CAPSULE (10 MG TOTAL) BY MOUTH 4 TIMES A DAY BEFORE MEALS AND AT BEDTIME 90 capsule 1   estradiol  (ESTRACE ) 0.1 MG/GM vaginal cream Apply a pea-sized amount to finger tip and rub in vaginal and urethral area 3x weekly 42.5 g 3   isosorbide  mononitrate (IMDUR ) 60 MG 24 hr tablet TAKE 1 TABLET BY MOUTH EVERY DAY 90 tablet 2   lisinopril -hydrochlorothiazide  (ZESTORETIC ) 20-12.5 MG tablet Take 1 tablet by mouth daily. 90 tablet 1   meloxicam  (MOBIC ) 15 MG tablet Take 1 tablet (15 mg total) by mouth daily. 90 tablet 1   montelukast  (SINGULAIR ) 10 MG tablet TAKE 1 TABLET BY MOUTH EVERYDAY AT BEDTIME 90 tablet 1   nitroGLYCERIN  (NITROSTAT ) 0.4 MG SL tablet Place 1 tablet (0.4 mg total) under the tongue every 5 (five) minutes as needed for chest pain. 25 tablet 3   rosuvastatin  (CRESTOR ) 20 MG tablet Take 1 tablet (20 mg total) by mouth daily. 90 tablet 3   sertraline  (ZOLOFT ) 100 MG tablet Take 1 tablet (100 mg total) by mouth at bedtime. 90 tablet 1   tirzepatide  (MOUNJARO ) 7.5 MG/0.5ML Pen Inject 7.5 mg into the skin once a week. 6 mL 1   triamcinolone  cream (KENALOG ) 0.1 % SMARTSIG:sparingly Topical Twice Daily PRN     Vitamin D , Ergocalciferol , (DRISDOL ) 1.25 MG (50000 UNIT) CAPS capsule Take 1 capsule (50,000 Units total) by mouth every 7 (seven) days. 12 capsule 4   cephALEXin  (KEFLEX ) 500 MG capsule Take 1 capsule (500 mg total) by mouth 4 (four) times daily. 28 capsule 0   No current facility-administered medications for this visit.    ROS: pelvic pain, more on left side  Exam:   BP 126/75   Pulse 72   Ht 5' 3 (1.6 m)   Wt 194 lb (88 kg)   SpO2 98%   BMI 34.37 kg/m   General appearance: alert and cooperative Head: Normocephalic, without  obvious abnormality, atraumatic Neck: no adenopathy, supple, symmetrical, trachea midline and thyroid  not enlarged, symmetric, no tenderness/mass/nodules Lungs: clear to auscultation bilaterally Heart: regular rate and rhythm, S1, S2 normal, no murmur, click, rub or gallop Abdomen: soft, non-tender; bowel sounds normal; no masses,  no organomegaly Extremities: extremities normal, atraumatic, no cyanosis or edema Skin: Skin color, texture, turgor normal. No rashes or lesions Lymph nodes: Cervical, supraclavicular, and axillary nodes normal. no inguinal nodes palpated Neurologic: Grossly normal  Assessment/Plan: 1. Ovarian neoplasm (Primary) - laparoscopic removal of adnexal cyst and hopeful BSO is planned.  She does not want ovary removed if there is significant adhesions present.  No hx of diverticulitis.   - Pre and post op instructions reviewed - Post op pain medications reviewed - Medications/Vitamins reviewed.  Pt knows needs to stop ASA at least 5 days prior to surery.  2. Type 2 diabetes mellitus with hyperlipidemia (HCC) - most recent hbA1c was 5.5  - she has already stopped her GLP 1  3. Presence of coronary angioplasty implant and graft - she has received cardiac clearance.  Note is in chart.

## 2023-10-02 ENCOUNTER — Ambulatory Visit (INDEPENDENT_AMBULATORY_CARE_PROVIDER_SITE_OTHER): Admitting: Family Medicine

## 2023-10-02 ENCOUNTER — Encounter: Payer: Self-pay | Admitting: Family Medicine

## 2023-10-02 ENCOUNTER — Encounter (HOSPITAL_COMMUNITY): Payer: Self-pay | Admitting: Obstetrics & Gynecology

## 2023-10-02 VITALS — BP 124/74 | HR 76 | Temp 98.3°F | Wt 195.8 lb

## 2023-10-02 DIAGNOSIS — R3129 Other microscopic hematuria: Secondary | ICD-10-CM | POA: Insufficient documentation

## 2023-10-02 DIAGNOSIS — R309 Painful micturition, unspecified: Secondary | ICD-10-CM | POA: Diagnosis not present

## 2023-10-02 LAB — POC URINALSYSI DIPSTICK (AUTOMATED)
Bilirubin, UA: NEGATIVE
Glucose, UA: NEGATIVE
Ketones, UA: NEGATIVE
Leukocytes, UA: NEGATIVE
Nitrite, UA: NEGATIVE
Protein, UA: POSITIVE — AB
Spec Grav, UA: 1.015 (ref 1.010–1.025)
Urobilinogen, UA: NEGATIVE U/dL — AB
pH, UA: 5.5 (ref 5.0–8.0)

## 2023-10-02 NOTE — Progress Notes (Signed)
 Spoke w/ via phone for pre-op  interview--- Kristie Owens needs dos----  CBC, T&S and Bmp per surgeon. CBG, A1C per anesthesia.       Owens results------ Current EKG in Epic dated 03/22/23. COVID test -----patient states asymptomatic no test needed Arrive at -------1000 NPO after MN NO Solid Food.  Clear liquids from MN until---0900 Pre-Surgery Ensure or G2:  Med rec completed Medications to take morning of surgery ----- Norvasc  and Coreg  Diabetic medication -----   GLP1 agonist last dose: Mounjaro  7.5mg  weekly. Last dose 09/18/23 GLP1 instructions: Hold any further doses until after surgery, pt verbalized understanding.  Patient instructed no nail polish to be worn day of surgery Patient instructed to bring photo id and insurance card day of surgery Patient aware to have Driver (ride ) / caregiver    for 24 hours after surgery - Husband Kristie Owens Patient Special Instructions ----- Pre-Op  special Instructions -----  Patient verbalized understanding of instructions that were given at this phone interview. Patient denies chest pain, sob, fever, cough at the interview.   Cardiac clearance by M. Rana, NP dated 09/18/23 in [place].  PCP: Dr Catherine Cardiologist : Jeffrie  PPM/ ICD: Device Orders: Rep Notified:  Chest x-ray : EKG : 03/22/23 Echo :12/07/19 Stress test: Cardiac Cath :   Activity level:  Sleep Study/ CPAP : yes Fasting Blood Sugar :      / Checks Blood Sugar -- times a day:   0  Blood Thinner/ Instructions /Last Dose: ASA / Instructions/ Last Dose :  hold 5 days prior to surgery.

## 2023-10-02 NOTE — Patient Instructions (Signed)

## 2023-10-02 NOTE — Progress Notes (Signed)
 Kristie Owens , November 21, 1955, 68 y.o., female MRN: [id number] Patient Care Team    Relationship Specialty Notifications Start End  Catherine Charlies LABOR, DO PCP - General Family Medicine  06/09/19   Jeffrie Oneil BROCKS, MD PCP - Cardiology Cardiology  12/17/19   Jeffrie Oneil BROCKS, MD Consulting Physician Cardiology  06/08/19   Elspeth Lauraine DEL, OD Referring Physician   06/11/19   Jalene Francis DEL Physician Assistant Chiropractic Medicine  06/11/19   Elspeth Lauraine DEL, OD Referring Physician   03/13/21     Chief Complaint  Patient presents with   Dysuria    Over a week; dysuria, urinary frequency, abdominal pressure. Pt was seen in UC last week and prescribed cephalexin .       Subjective: Kristie Owens is a 68 y.o. Pt presents for an OV with complaints of urinary pain and pressure . She was seen in the urgent care 09/24/2023 with sudden onset acute cystitis with gross hematuria.  Patient reports she has had UTIs in the past, but this particular infection had quite a bit of pain associated with the urination.  She noticed small flecks of blood in her urine.  She had a low-grade fever.  Pain became significant enough that she went to the urgent care the same day as her symptoms.  She had taken Azo prior to going to the urgent care therefore point-of-care urine reflects Azo use.  She was prescribed Keflex  4 times daily for urine cultures were sent.  Patient reports her symptoms had greatly improved on the Keflex , but her urine culture did not show much in the way of infection.  She states all of her symptoms have resolved except for some mild pressure feeling with urination. She denies history of kidney stones.  She has a laparoscopic oophorectomy salpingectomy surgery coming up in 6 days and wants to make sure she has no signs of infection     07/01/2023    8:45 AM 04/11/2023    9:07 AM 03/18/2023    8:42 AM 08/08/2022   11:23 AM 03/12/2022    8:04 AM  Depression screen PHQ 2/9  Decreased Interest 0 0 0 0 0   Down, Depressed, Hopeless 0 0 0 0 0  PHQ - 2 Score 0 0 0 0 0  Altered sleeping 2  3  3   Tired, decreased energy 1  1  1   Change in appetite 1  0  0  Feeling bad or failure about yourself  0  0  0  Trouble concentrating 0  0  0  Moving slowly or fidgety/restless 0  0  0  Suicidal thoughts 0  0  0  PHQ-9 Score 4  4  4   Difficult doing work/chores Not difficult at all  Somewhat difficult      Allergies  Allergen Reactions   Bupropion Nausea Only and Other (See Comments)    Nausea and headaches    Sulfa Antibiotics Other (See Comments)    From childhood- reaction not recalled   Prednisone  Anxiety and Other (See Comments)    Made me not feel like myself, altered   Social History   Social History Narrative   Marital status/children/pets: Married.  One child.   Education/employment: Retired.  Associates degree in college.   Safety:      -Wears a bicycle helmet riding a bike: Yes     -smoke alarm in the home:Yes     - wears seatbelt: Yes     -  Feels safe in their relationships: Yes   Past Medical History:  Diagnosis Date   Achilles bursitis or tendinitis 11/11/2012   right    Allergy    Angina at rest [facility])    Dr Jeffrie   CAD (coronary artery disease), native coronary artery    DES to LAD in 2 areas, one at the bifurcation of a diagonal branch and also in the mid to distal region. Her diagonal branch was also ballooned . She has a DES to RCA 10/10. Her circumflex artery mild ostial disease.   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    Tonsillar calculus  08/12/2018   Transient neurologic deficit 07/02/2018   Tremor 03/23/2020   Past Surgical History:  Procedure Laterality Date   BREAST EXCISIONAL BIOPSY Right 1995   and in 1996   CAROTID STENT  2010   3 done wtithin a few weeks   CESAREAN SECTION  1983   FOOT SURGERY     LEFT HEART CATH AND CORONARY ANGIOGRAPHY N/A 06/04/2016   Procedure: Left Heart Cath and Coronary Angiography;  Surgeon: Claudene Victory ORN, MD;  [facility] INVASIVE CV LAB;  Service: Cardiovascular;  Laterality: N/A;   LEFT HEART CATH AND CORONARY ANGIOGRAPHY N/A 12/04/2019   Procedure: LEFT HEART CATH AND CORONARY ANGIOGRAPHY;  Surgeon: Verlin Lonni BIRCH, MD;  Location: [facility] INVASIVE CV LAB;  Service: Cardiovascular;  Laterality: N/A;   LEFT HEART CATHETERIZATION WITH CORONARY ANGIOGRAM N/A 01/16/2011   Procedure: LEFT HEART CATHETERIZATION WITH CORONARY ANGIOGRAM;  Surgeon: Oneil Jeffrie, MD;  Location[facility] C[facility] LAB;  Service: Cardiovascular;  Laterality: N/A;   NASAL SEPTUM SURGERY  2000   SINOSCOPY     Family History  Problem Relation Age of Onset   Hypertension Mother    Heart disease Mother    COPD Mother    Ulcers Mother    Lung cancer Father    Hypertension Sister    Diabetes Sister    Arthritis Sister    Depression Sister    Miscarriages / [place] Sister    Early death Maternal Grandmother    Tuberculosis Maternal Grandmother    Lung cancer Maternal Grandfather    Suicidality Maternal Grandfather    Early death Maternal Grandfather    Heart failure Paternal Grandmother    Heart attack Paternal Grandmother    Hypertension Paternal Grandmother    Hyperlipidemia Paternal Grandmother    Heart disease Paternal Grandmother    Heart disease Paternal Grandfather    Hyperlipidemia Paternal Grandfather    Hypertension Paternal Grandfather    Hyperlipidemia Brother    Hypertension Brother    Intellectual disability Brother    Kidney disease Brother    Heart attack Brother    Asthma Brother     Early death Brother 11   Hyperlipidemia Brother    Hypertension Brother    Intellectual disability Brother    Kidney disease Brother    Heart disease Brother  Alcohol abuse Brother    Heart attack Other    Stroke Neg Hx    Breast cancer Neg Hx    Allergies as of 10/02/2023       Reactions   Bupropion Nausea Only, Other (See Comments)   Nausea and headaches    Sulfa Antibiotics Other (See Comments)   From childhood- reaction not recalled   Prednisone  Anxiety, Other (See Comments)   Made me not feel like myself, altered        Medication List        Accurate as of October 02, 2023  2:47 PM. If you have any questions, ask your nurse or doctor.          STOP taking these medications    cephALEXin  500 MG capsule Commonly known as: KEFLEX  Stopped by: Charlies Bellini       TAKE these medications    amLODipine  5 MG tablet Commonly known as: NORVASC  Take 1 tablet (5 mg total) by mouth daily.   aspirin  EC 81 MG tablet Take 81 mg by mouth daily.   budesonide  0.5 MG/2ML nebulizer solution Commonly known as: Pulmicort  Use twice a day in a sinus rinse bottle.   carvedilol  6.25 MG tablet Commonly known as: COREG  Take 1 tablet (6.25 mg total) by mouth 2 (two) times daily with a meal.   cetirizine  10 MG tablet Commonly known as: ZyrTEC  Allergy Take 1 tablet (10 mg total) by mouth daily.   clindamycin 1 % external solution Commonly known as: CLEOCIN T SMARTSIG:15 Milliliter(s) Topical Twice Daily PRN   cromolyn  4 % ophthalmic solution Commonly known as: OPTICROM  Place 1 drop into both eyes 4 (four) times daily as needed (itchy/watery eyes).   dicyclomine  10 MG capsule Commonly known as: BENTYL  TAKE 1 CAPSULE (10 MG TOTAL) BY MOUTH 4 TIMES A DAY BEFORE MEALS AND AT BEDTIME   estradiol  0.1 MG/GM vaginal cream Commonly known as: ESTRACE  Apply a pea-sized amount to finger tip and rub in vaginal and urethral area 3x weekly   isosorbide  mononitrate 60 MG 24 hr  tablet Commonly known as: IMDUR  TAKE 1 TABLET BY MOUTH EVERY DAY   lisinopril -hydrochlorothiazide  20-12.5 MG tablet Commonly known as: ZESTORETIC  Take 1 tablet by mouth daily.   meloxicam  15 MG tablet Commonly known as: MOBIC  Take 1 tablet (15 mg total) by mouth daily.   montelukast  10 MG tablet Commonly known as: SINGULAIR  TAKE 1 TABLET BY MOUTH EVERYDAY AT BEDTIME   nitroGLYCERIN  0.4 MG SL tablet Commonly known as: NITROSTAT  Place 1 tablet (0.4 mg total) under the tongue every 5 (five) minutes as needed for chest pain.   rosuvastatin  20 MG tablet Commonly known as: CRESTOR  Take 1 tablet (20 mg total) by mouth daily.   sertraline  100 MG tablet Commonly known as: ZOLOFT  Take 1 tablet (100 mg total) by mouth at bedtime.   tirzepatide  7.5 MG/0.5ML Pen Commonly known as: MOUNJARO  Inject 7.5 mg into the skin once a week.   triamcinolone  cream 0.1 % Commonly known as: KENALOG  SMARTSIG:sparingly Topical Twice Daily PRN   Vitamin D  (Ergocalciferol ) 1.25 MG (50000 UNIT) Caps capsule Commonly known as: DRISDOL  Take 1 capsule (50,000 Units total) by mouth every 7 (seven) days.        All past medical history, surgical history, allergies, family history, immunizations andmedications were updated in the EMR today and reviewed under the history and medication portions of their EMR.     ROS Negative, with the exception of above mentioned in HPI  Objective:  BP 124/74   Pulse 76   Temp 98.3 F (36.8 C)   Wt 195 lb 12.8 oz (88.8 kg)   SpO2 96%   BMI 34.68 kg/m  Body mass index is 34.68 kg/m.  Physical Exam Vitals and nursing note reviewed.  Constitutional:      General: She is not in acute distress.    Appearance: Normal appearance. She is normal weight. She is not ill-appearing or toxic-appearing.  HENT:     Head: Normocephalic and atraumatic.  Eyes:     General: No scleral icterus.       Right eye: No discharge.        Left eye: No discharge.      Extraocular Movements: Extraocular movements intact.     Conjunctiva/sclera: Conjunctivae normal.     Pupils: Pupils are equal, round, and reactive to light.  Abdominal:     General: Abdomen is flat. Bowel sounds are normal. There is no distension.     Palpations: Abdomen is soft.     Tenderness: There is no abdominal tenderness. There is no right CVA tenderness, left CVA tenderness, guarding or rebound.  Skin:    Findings: No rash.  Neurological:     Mental Status: She is alert and oriented to person, place, and time. Mental status is at baseline.     Motor: No weakness.     Coordination: Coordination normal.     Gait: Gait normal.  Psychiatric:        Mood and Affect: Mood normal.        Behavior: Behavior normal.        Thought Content: Thought content normal.        Judgment: Judgment normal.      No results found. No results found. Results for orders placed or performed in visit on 10/02/23 (from the past 24 hours)  POCT Urinalysis Dipstick (Automated)     Status: Abnormal   Collection Time: 10/02/23  2:28 PM  Result Value Ref Range   Color, UA yellow    Clarity, UA clear    Glucose, UA Negative Negative   Bilirubin, UA negative    Ketones, UA negative    Spec Grav, UA 1.015 1.010 - 1.025   Blood, UA 3+    pH, UA 5.5 5.0 - 8.0   Protein, UA Positive (A) Negative   Urobilinogen, UA negative (A) 0.2 or 1.0 E.U./dL   Nitrite, UA negative    Leukocytes, UA Negative Negative    Assessment/Plan: Kristie Owens is a 68 y.o. female present for OV for  Hematuria/urinary pain Symptoms have almost completely resolved with the use of Keflex  prescribed by urgent care. Patient reports she still having some mild pressure feeling in her bladder.  Hematuria still present on point-of-care urine today. Urinary culture sent and pending> discussed positive would treat, consider Cipro  use for 3 days if appropriate CBC with differential and CMP collected today to be thorough due to  upcoming surgery. Encouraged her to hydrate well Patient will be called with results and follow-up discussed at that time  Reviewed expectations re: course of current medical issues. Discussed self-management of symptoms. Outlined signs and symptoms indicating need for more acute intervention. Patient verbalized understanding and all questions were answered. Patient received an After-Visit Summary.    Orders Placed This Encounter  Procedures   Urinalysis w microscopic + reflex cultur   Comp Met (CMET)   CBC w/Diff   POCT Urinalysis Dipstick (Automated)   No  orders of the defined types were placed in this encounter.  Referral Orders  No referral(s) requested today     Note is dictated utilizing voice recognition software. Although note has been proof read prior to signing, occasional typographical errors still can be missed. If any questions arise, please do not hesitate to call for verification.   electronically signed by:  Charlies Bellini, DO  [facility] - [place]

## 2023-10-03 ENCOUNTER — Ambulatory Visit: Payer: Self-pay | Admitting: Family Medicine

## 2023-10-03 LAB — COMPREHENSIVE METABOLIC PANEL WITH GFR
AG Ratio: 1.3 (calc) (ref 1.0–2.5)
ALT: 13 U/L (ref 6–29)
AST: 18 U/L (ref 10–35)
Albumin: 4.3 g/dL (ref 3.6–5.1)
Alkaline phosphatase (APISO): 64 U/L (ref 37–153)
BUN/Creatinine Ratio: 27 (calc) — ABNORMAL HIGH (ref 6–22)
BUN: 27 mg/dL — ABNORMAL HIGH (ref 7–25)
CO2: 29 mmol/L (ref 20–32)
Calcium: 10 mg/dL (ref 8.6–10.4)
Chloride: 100 mmol/L (ref 98–110)
Creat: 1.01 mg/dL — ABNORMAL HIGH (ref 0.60–1.00)
Globulin: 3.2 g/dL (ref 1.9–3.7)
Glucose, Bld: 105 mg/dL — ABNORMAL HIGH (ref 65–99)
Potassium: 4.3 mmol/L (ref 3.5–5.3)
Sodium: 137 mmol/L (ref 135–146)
Total Bilirubin: 0.3 mg/dL (ref 0.2–1.2)
Total Protein: 7.5 g/dL (ref 6.1–8.1)
eGFR: 60 mL/min/1.73m2 (ref >=60)

## 2023-10-03 LAB — CBC WITH DIFFERENTIAL/PLATELET
Absolute Lymphocytes: 2117 {cells}/uL (ref 850–3900)
Absolute Monocytes: 466 {cells}/uL (ref 200–950)
Basophils Absolute: 38 {cells}/uL (ref 0–200)
Basophils Relative: 0.9 %
Eosinophils Absolute: 109 {cells}/uL (ref 15–500)
Eosinophils Relative: 2.6 %
HCT: 37.5 % (ref 35.0–45.0)
Hemoglobin: 11.9 g/dL (ref 11.7–15.5)
MCH: 25.5 pg — ABNORMAL LOW (ref 27.0–33.0)
MCHC: 31.7 g/dL — ABNORMAL LOW (ref 32.0–36.0)
MCV: 80.5 fL (ref 80.0–100.0)
MPV: 8.5 fL (ref 7.5–12.5)
Monocytes Relative: 11.1 %
Neutro Abs: 1470 {cells}/uL — ABNORMAL LOW (ref 1500–7800)
Neutrophils Relative %: 35 %
Platelets: 408 Thousand/uL — ABNORMAL HIGH (ref 140–400)
RBC: 4.66 Million/uL (ref 3.80–5.10)
RDW: 15.9 % — ABNORMAL HIGH (ref 11.0–15.0)
Total Lymphocyte: 50.4 %
WBC: 4.2 Thousand/uL (ref 3.8–10.8)

## 2023-10-04 LAB — URINALYSIS W MICROSCOPIC + REFLEX CULTURE
Bacteria, UA: NONE SEEN /HPF
Bilirubin Urine: NEGATIVE
Glucose, UA: NEGATIVE
Ketones, ur: NEGATIVE
Nitrites, Initial: NEGATIVE
Specific Gravity, Urine: 1.016 (ref 1.001–1.035)
pH: <=5 — AB (ref 5.0–8.0)

## 2023-10-04 LAB — CULTURE INDICATED

## 2023-10-04 LAB — URINE CULTURE
MICRO NUMBER:: 16922243
Result:: NO GROWTH
SPECIMEN QUALITY:: ADEQUATE

## 2023-10-08 ENCOUNTER — Ambulatory Visit (HOSPITAL_COMMUNITY): Admitting: Anesthesiology

## 2023-10-08 ENCOUNTER — Ambulatory Visit (HOSPITAL_COMMUNITY)
Admission: RE | Admit: 2023-10-08 | Discharge: 2023-10-08 | Disposition: A | Discharge status: Home / Self Care | Attending: Obstetrics & Gynecology | Admitting: Obstetrics & Gynecology

## 2023-10-08 ENCOUNTER — Other Ambulatory Visit (HOSPITAL_COMMUNITY): Payer: Self-pay

## 2023-10-08 ENCOUNTER — Other Ambulatory Visit: Payer: Self-pay

## 2023-10-08 ENCOUNTER — Encounter (HOSPITAL_COMMUNITY)
Admission: RE | Disposition: A | Discharge status: Home / Self Care | Payer: Self-pay | Source: Home / Self Care | Attending: Obstetrics & Gynecology

## 2023-10-08 ENCOUNTER — Other Ambulatory Visit (HOSPITAL_BASED_OUTPATIENT_CLINIC_OR_DEPARTMENT_OTHER): Payer: Self-pay | Admitting: Obstetrics & Gynecology

## 2023-10-08 ENCOUNTER — Encounter (HOSPITAL_COMMUNITY): Payer: Self-pay | Admitting: Obstetrics & Gynecology

## 2023-10-08 DIAGNOSIS — R1909 Other intra-abdominal and pelvic swelling, mass and lump: Secondary | ICD-10-CM

## 2023-10-08 DIAGNOSIS — E1169 Type 2 diabetes mellitus with other specified complication: Secondary | ICD-10-CM

## 2023-10-08 DIAGNOSIS — Z7982 Long term (current) use of aspirin: Secondary | ICD-10-CM | POA: Diagnosis not present

## 2023-10-08 DIAGNOSIS — G4733 Obstructive sleep apnea (adult) (pediatric): Secondary | ICD-10-CM

## 2023-10-08 DIAGNOSIS — D271 Benign neoplasm of left ovary: Secondary | ICD-10-CM | POA: Diagnosis not present

## 2023-10-08 DIAGNOSIS — N83202 Unspecified ovarian cyst, left side: Secondary | ICD-10-CM

## 2023-10-08 DIAGNOSIS — E669 Obesity, unspecified: Secondary | ICD-10-CM | POA: Diagnosis not present

## 2023-10-08 DIAGNOSIS — E785 Hyperlipidemia, unspecified: Secondary | ICD-10-CM | POA: Insufficient documentation

## 2023-10-08 DIAGNOSIS — N9489 Other specified conditions associated with female genital organs and menstrual cycle: Secondary | ICD-10-CM

## 2023-10-08 DIAGNOSIS — Z791 Long term (current) use of non-steroidal anti-inflammatories (NSAID): Secondary | ICD-10-CM | POA: Diagnosis not present

## 2023-10-08 DIAGNOSIS — Z833 Family history of diabetes mellitus: Secondary | ICD-10-CM | POA: Insufficient documentation

## 2023-10-08 DIAGNOSIS — F32A Depression, unspecified: Secondary | ICD-10-CM | POA: Diagnosis not present

## 2023-10-08 DIAGNOSIS — I10 Essential (primary) hypertension: Secondary | ICD-10-CM | POA: Diagnosis not present

## 2023-10-08 DIAGNOSIS — N838 Other noninflammatory disorders of ovary, fallopian tube and broad ligament: Secondary | ICD-10-CM | POA: Insufficient documentation

## 2023-10-08 DIAGNOSIS — Z955 Presence of coronary angioplasty implant and graft: Secondary | ICD-10-CM | POA: Insufficient documentation

## 2023-10-08 DIAGNOSIS — Z6834 Body mass index (BMI) 34.0-34.9, adult: Secondary | ICD-10-CM | POA: Insufficient documentation

## 2023-10-08 DIAGNOSIS — I2510 Atherosclerotic heart disease of native coronary artery without angina pectoris: Secondary | ICD-10-CM | POA: Diagnosis not present

## 2023-10-08 DIAGNOSIS — E119 Type 2 diabetes mellitus without complications: Secondary | ICD-10-CM | POA: Insufficient documentation

## 2023-10-08 DIAGNOSIS — D279 Benign neoplasm of unspecified ovary: Secondary | ICD-10-CM

## 2023-10-08 DIAGNOSIS — Z7985 Long-term (current) use of injectable non-insulin antidiabetic drugs: Secondary | ICD-10-CM | POA: Diagnosis not present

## 2023-10-08 DIAGNOSIS — Z79899 Other long term (current) drug therapy: Secondary | ICD-10-CM | POA: Diagnosis not present

## 2023-10-08 DIAGNOSIS — D270 Benign neoplasm of right ovary: Secondary | ICD-10-CM | POA: Insufficient documentation

## 2023-10-08 DIAGNOSIS — Z01818 Encounter for other preprocedural examination: Secondary | ICD-10-CM

## 2023-10-08 DIAGNOSIS — Z7951 Long term (current) use of inhaled steroids: Secondary | ICD-10-CM | POA: Diagnosis not present

## 2023-10-08 HISTORY — DX: Nausea with vomiting, unspecified: R11.2

## 2023-10-08 HISTORY — DX: Other specified postprocedural states: Z98.890

## 2023-10-08 LAB — BASIC METABOLIC PANEL WITH GFR
Anion gap: 10 (ref 5–15)
BUN: 24 mg/dL — ABNORMAL HIGH (ref 8–23)
CO2: 20 mmol/L — ABNORMAL LOW (ref 22–32)
Calcium: 9.1 mg/dL (ref 8.9–10.3)
Chloride: 107 mmol/L (ref 98–111)
Creatinine, Ser: 0.94 mg/dL (ref 0.44–1.00)
GFR, Estimated: >60 mL/min (ref >60)
Glucose, Bld: 105 mg/dL — ABNORMAL HIGH (ref 70–99)
Potassium: 4 mmol/L (ref 3.5–5.1)
Sodium: 137 mmol/L (ref 135–145)

## 2023-10-08 LAB — CBC
HCT: 37.3 % (ref 36.0–46.0)
Hemoglobin: 11.8 g/dL — ABNORMAL LOW (ref 12.0–15.0)
MCH: 25.8 pg — ABNORMAL LOW (ref 26.0–34.0)
MCHC: 31.6 g/dL (ref 30.0–36.0)
MCV: 81.6 fL (ref 80.0–100.0)
Platelets: 343 K/uL (ref 150–400)
RBC: 4.57 MIL/uL (ref 3.87–5.11)
RDW: 16.2 % — ABNORMAL HIGH (ref 11.5–15.5)
WBC: 3.9 K/uL — ABNORMAL LOW (ref 4.0–10.5)
nRBC: 0 % (ref 0.0–0.2)

## 2023-10-08 LAB — GLUCOSE, CAPILLARY
Glucose-Capillary: 106 mg/dL — ABNORMAL HIGH (ref 70–99)
Glucose-Capillary: 122 mg/dL — ABNORMAL HIGH (ref 70–99)

## 2023-10-08 LAB — HEMOGLOBIN A1C
Hgb A1c MFr Bld: 6 % — ABNORMAL HIGH (ref 4.8–5.6)
Mean Plasma Glucose: 125.5 mg/dL

## 2023-10-08 LAB — TYPE AND SCREEN
ABO/RH(D): O NEG
Antibody Screen: NEGATIVE

## 2023-10-08 LAB — ABO/RH: ABO/RH(D): O NEG

## 2023-10-08 SURGERY — SALPINGO-OOPHORECTOMY, LAPAROSCOPIC
Anesthesia: General

## 2023-10-08 MED ORDER — ONDANSETRON HCL 4 MG/2ML IJ SOLN: 4.0000 mg | Freq: Once | INTRAMUSCULAR | Status: DC | PRN

## 2023-10-08 MED ORDER — PHENYLEPHRINE 80 MCG/ML (10ML) SYRINGE FOR IV PUSH (FOR BLOOD PRESSURE SUPPORT)
PREFILLED_SYRINGE | INTRAVENOUS | Status: DC | PRN
Start: 2023-10-08 — End: 2023-10-08
  Administered 2023-10-08: 160 ug via INTRAVENOUS
  Administered 2023-10-08: 80 ug via INTRAVENOUS

## 2023-10-08 MED ORDER — PROPOFOL 10 MG/ML IV BOLUS
INTRAVENOUS | Status: AC
  Filled 2023-10-08: qty 20

## 2023-10-08 MED ORDER — OXYCODONE HCL 5 MG PO TABS
ORAL_TABLET | ORAL | Status: AC
  Filled 2023-10-08: qty 1

## 2023-10-08 MED ORDER — 0.9 % SODIUM CHLORIDE (POUR BTL) OPTIME
TOPICAL | Status: DC | PRN
  Administered 2023-10-08: 1000 mL

## 2023-10-08 MED ORDER — MIDAZOLAM HCL 2 MG/2ML IJ SOLN
INTRAMUSCULAR | Status: AC
  Filled 2023-10-08: qty 2

## 2023-10-08 MED ORDER — ONDANSETRON HCL 4 MG/2ML IJ SOLN
INTRAMUSCULAR | Status: AC
  Filled 2023-10-08: qty 2

## 2023-10-08 MED ORDER — ACETAMINOPHEN 500 MG PO TABS
1000.0000 mg | ORAL_TABLET | ORAL | Status: AC
  Administered 2023-10-08: 1000 mg via ORAL

## 2023-10-08 MED ORDER — DEXAMETHASONE SODIUM PHOSPHATE 10 MG/ML IJ SOLN
INTRAMUSCULAR | Status: DC | PRN
  Administered 2023-10-08: 10 mg via INTRAVENOUS

## 2023-10-08 MED ORDER — EPHEDRINE 5 MG/ML INJ
INTRAVENOUS | Status: AC
Start: 2023-10-08 — End: 2023-10-08
  Filled 2023-10-08: qty 5

## 2023-10-08 MED ORDER — DEXAMETHASONE SODIUM PHOSPHATE 10 MG/ML IJ SOLN
INTRAMUSCULAR | Status: AC
  Filled 2023-10-08: qty 1

## 2023-10-08 MED ORDER — MIDAZOLAM HCL 2 MG/2ML IJ SOLN
INTRAMUSCULAR | Status: DC | PRN
  Administered 2023-10-08: 1 mg via INTRAVENOUS

## 2023-10-08 MED ORDER — OXYCODONE HCL 5 MG PO TABS
5.0000 mg | ORAL_TABLET | Freq: Once | ORAL | Status: AC | PRN
  Administered 2023-10-08: 5 mg via ORAL

## 2023-10-08 MED ORDER — PHENYLEPHRINE HCL-NACL 20-0.9 MG/250ML-% IV SOLN
INTRAVENOUS | Status: DC | PRN
  Administered 2023-10-08: 45 ug/min via INTRAVENOUS

## 2023-10-08 MED ORDER — PROPOFOL 10 MG/ML IV BOLUS
INTRAVENOUS | Status: DC | PRN
  Administered 2023-10-08: 120 mg via INTRAVENOUS

## 2023-10-08 MED ORDER — SUGAMMADEX SODIUM 200 MG/2ML IV SOLN
INTRAVENOUS | Status: DC | PRN
Start: 2023-10-08 — End: 2023-10-08
  Administered 2023-10-08: 200 mg via INTRAVENOUS

## 2023-10-08 MED ORDER — INSULIN ASPART 100 UNIT/ML IJ SOLN: 0.0000 [IU] | INTRAMUSCULAR | Status: DC | PRN

## 2023-10-08 MED ORDER — POVIDONE-IODINE 10 % EX SWAB: 2.0000 | Freq: Once | CUTANEOUS | Status: DC

## 2023-10-08 MED ORDER — ACETAMINOPHEN 500 MG PO TABS
ORAL_TABLET | ORAL | Status: AC
  Filled 2023-10-08: qty 2

## 2023-10-08 MED ORDER — HYDROCODONE-ACETAMINOPHEN 5-325 MG PO TABS
1.0000 | ORAL_TABLET | Freq: Four times a day (QID) | ORAL | 0 refills | Status: DC | PRN
  Filled 2023-10-08: qty 20, 3d supply, fill #0

## 2023-10-08 MED ORDER — ROCURONIUM BROMIDE 10 MG/ML (PF) SYRINGE
PREFILLED_SYRINGE | INTRAVENOUS | Status: DC | PRN
  Administered 2023-10-08: 20 mg via INTRAVENOUS
  Administered 2023-10-08: 50 mg via INTRAVENOUS
  Administered 2023-10-08: 10 mg via INTRAVENOUS

## 2023-10-08 MED ORDER — SODIUM CHLORIDE 0.9 % IR SOLN
Status: DC | PRN
  Administered 2023-10-08: 1000 mL

## 2023-10-08 MED ORDER — LIDOCAINE 2% (20 MG/ML) 5 ML SYRINGE
INTRAMUSCULAR | Status: DC | PRN
  Administered 2023-10-08: 60 mg via INTRAVENOUS

## 2023-10-08 MED ORDER — ONDANSETRON HCL 4 MG/2ML IJ SOLN
INTRAMUSCULAR | Status: DC | PRN
  Administered 2023-10-08: 4 mg via INTRAVENOUS

## 2023-10-08 MED ORDER — BUPIVACAINE HCL (PF) 0.25 % IJ SOLN
INTRAMUSCULAR | Status: AC
  Filled 2023-10-08: qty 60

## 2023-10-08 MED ORDER — PHENYLEPHRINE 80 MCG/ML (10ML) SYRINGE FOR IV PUSH (FOR BLOOD PRESSURE SUPPORT)
PREFILLED_SYRINGE | INTRAVENOUS | Status: AC
  Filled 2023-10-08: qty 10

## 2023-10-08 MED ORDER — OXYCODONE HCL 5 MG/5ML PO SOLN: 5.0000 mg | Freq: Once | ORAL | Status: AC | PRN

## 2023-10-08 MED ORDER — LIDOCAINE 2% (20 MG/ML) 5 ML SYRINGE
INTRAMUSCULAR | Status: AC
  Filled 2023-10-08: qty 5

## 2023-10-08 MED ORDER — ORAL CARE MOUTH RINSE: 15.0000 mL | Freq: Once | OROMUCOSAL | Status: AC

## 2023-10-08 MED ORDER — CHLORHEXIDINE GLUCONATE 0.12 % MT SOLN
15.0000 mL | Freq: Once | OROMUCOSAL | Status: AC
  Administered 2023-10-08: 15 mL via OROMUCOSAL

## 2023-10-08 MED ORDER — ROCURONIUM BROMIDE 10 MG/ML (PF) SYRINGE
PREFILLED_SYRINGE | INTRAVENOUS | Status: AC
  Filled 2023-10-08: qty 10

## 2023-10-08 MED ORDER — EPHEDRINE SULFATE-NACL 50-0.9 MG/10ML-% IV SOSY
PREFILLED_SYRINGE | INTRAVENOUS | Status: DC | PRN
Start: 2023-10-08 — End: 2023-10-08
  Administered 2023-10-08: 20 mg via INTRAVENOUS
  Administered 2023-10-08: 5 mg via INTRAVENOUS

## 2023-10-08 MED ORDER — CHLORHEXIDINE GLUCONATE 0.12 % MT SOLN
OROMUCOSAL | Status: AC
  Filled 2023-10-08: qty 15

## 2023-10-08 MED ORDER — FENTANYL CITRATE (PF) 100 MCG/2ML IJ SOLN: 25.0000 ug | INTRAMUSCULAR | Status: DC | PRN

## 2023-10-08 MED ORDER — FENTANYL CITRATE (PF) 250 MCG/5ML IJ SOLN
INTRAMUSCULAR | Status: DC | PRN
  Administered 2023-10-08: 100 ug via INTRAVENOUS

## 2023-10-08 MED ORDER — BUPIVACAINE HCL (PF) 0.25 % IJ SOLN
INTRAMUSCULAR | Status: DC | PRN
  Administered 2023-10-08: 12 mL
  Administered 2023-10-08: 18 mL

## 2023-10-08 MED ORDER — FENTANYL CITRATE (PF) 250 MCG/5ML IJ SOLN
INTRAMUSCULAR | Status: AC
  Filled 2023-10-08: qty 5

## 2023-10-08 MED ORDER — LACTATED RINGERS IV SOLN: INTRAVENOUS | Status: DC

## 2023-10-08 SURGICAL SUPPLY — 35 items
BAG COUNTER SPONGE SURGICOUNT (BAG) ×2 IMPLANT
DEFOGGER SCOPE WARM SEASHARP (MISCELLANEOUS) IMPLANT
DERMABOND ADVANCED .7 DNX12 (GAUZE/BANDAGES/DRESSINGS) ×2 IMPLANT
DILATOR CANAL MILEX (MISCELLANEOUS) IMPLANT
DRAPE SURG IRRIG POUCH 19X23 (DRAPES) ×2 IMPLANT
DURAPREP 26ML APPLICATOR (WOUND CARE) ×2 IMPLANT
GAUZE PETROLATUM 1 X8 (GAUZE/BANDAGES/DRESSINGS) IMPLANT
GLOVE BIOGEL PI IND STRL 7.0 (GLOVE) ×8 IMPLANT
GLOVE ECLIPSE 6.5 STRL STRAW (GLOVE) ×2 IMPLANT
GOWN STRL REUS W/ TWL LRG LVL3 (GOWN DISPOSABLE) ×6 IMPLANT
IRRIGATION SUCT STRKRFLW 2 WTP (MISCELLANEOUS) IMPLANT
KIT PINK PAD W/HEAD ARM REST (MISCELLANEOUS) ×2 IMPLANT
KIT TURNOVER KIT B (KITS) ×2 IMPLANT
LIGASURE VESSEL 5MM BLUNT TIP (ELECTROSURGICAL) IMPLANT
NDL INSUFFLATION 14GA 120MM (NEEDLE) ×2 IMPLANT
NEEDLE INSUFFLATION 14GA 120MM (NEEDLE) ×1 IMPLANT
PACK LAPAROSCOPY BASIN (CUSTOM PROCEDURE TRAY) ×2 IMPLANT
POUCH LAPAROSCOPIC INSTRUMENT (MISCELLANEOUS) ×2 IMPLANT
POWDER SURGICEL 3.0 GRAM (HEMOSTASIS) IMPLANT
SET TUBE SMOKE EVAC HIGH FLOW (TUBING) IMPLANT
SHEARS HARMONIC 36 ACE (MISCELLANEOUS) IMPLANT
SLEEVE ADV FIXATION 5X100MM (TROCAR) IMPLANT
SUT VIC AB 4-0 PS2 18 (SUTURE) ×2 IMPLANT
SUT VICRYL 0 UR6 27IN ABS (SUTURE) IMPLANT
SYR 30ML LL (SYRINGE) IMPLANT
SYSTEM BAG RETRIEVAL 10MM (BASKET) IMPLANT
SYSTEM CARTER THOMASON II (TROCAR) IMPLANT
TIP ENDOSCOPIC SURGICEL (TIP) IMPLANT
TOWEL GREEN STERILE FF (TOWEL DISPOSABLE) ×4 IMPLANT
TRAP SPECIMEN MUCUS 40CC (MISCELLANEOUS) IMPLANT
TRAY FOLEY W/BAG SLVR 14FR (SET/KITS/TRAYS/PACK) ×2 IMPLANT
TROCAR 11X100 Z THREAD (TROCAR) IMPLANT
TROCAR ADV FIXATION 5X100MM (TROCAR) ×2 IMPLANT
TROCAR XCEL NON-BLD 5MMX100MML (ENDOMECHANICALS) ×4 IMPLANT
WARMER LAPAROSCOPE (MISCELLANEOUS) ×2 IMPLANT

## 2023-10-08 NOTE — H&P (Signed)
 Kristie Owens is an 68 y.o. female G1P1 MWF with hx of left cystic lesion here for laparoscopic excision.  She does want both ovaries removed if this is possible.  She has undergone pelvic MRI as well for evaluation and ca-125 which was in the normal range.  Risks, benefits and alternatives have been discussed and she has decided to proceed with surgical excision.  Procedure reviewed.  Pt's daughter and husband are here as well.    Pertinent Gynecological History: Menses: post-menopausal Bleeding: post menopausal bleeding Contraception: PMP status DES exposure: denies Blood transfusions: none Sexually transmitted diseases: no past history Previous GYN Procedures: c section x 1  Last mammogram: normal Date: 07/23/2023 Last pap: normal Date: 3/13 OB History: G1, P1   Menstrual History: No LMP recorded. Patient is postmenopausal.    Past Medical History:  Diagnosis Date   Achilles bursitis or tendinitis 11/11/2012   right    Allergy    Angina at rest [facility])    Dr Jeffrie   CAD (coronary artery disease), native coronary artery    DES to LAD in 2 areas, one at the bifurcation of a diagonal branch and also in the mid to distal region. Her diagonal branch was also ballooned . She has a DES to RCA 10/10. Her circumflex artery mild ostial disease.   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   Tremor 03/23/2020    Past Surgical History:  Procedure  Laterality Date   BREAST EXCISIONAL BIOPSY Right 1995   and in 1996   CAROTID STENT  2010   3 done wtithin a few weeks   CESAREAN SECTION  1983   FOOT SURGERY     LEFT HEART CATH AND CORONARY ANGIOGRAPHY N/A 06/04/2016   Procedure: Left Heart Cath and Coronary Angiography;  Surgeon: Claudene Victory ORN, MD;  Location: [facility] INVASIVE CV LAB;  Service: Cardiovascular;  Laterality: N/A;   LEFT HEART CATH AND CORONARY ANGIOGRAPHY N/A 12/04/2019   Procedure: LEFT HEART CATH AND CORONARY ANGIOGRAPHY;  Surgeon: Verlin Lonni JONETTA, MD;  Location: [facility] INVASIVE CV LAB;  Service: Cardiovascular;  Laterality: N/A;   LEFT HEART CATHETERIZATION WITH CORONARY ANGIOGRAM N/A 01/16/2011   Procedure: LEFT HEART CATHETERIZATION WITH CORONARY ANGIOGRAM;  Surgeon: Oneil Jeffrie, MD;  Location: [facility] CATH LAB;  Service: Cardiovascular;  Laterality: N/A;   NASAL SEPTUM SURGERY  2000   SINOSCOPY      Family History  Problem Relation Age of Onset   Hypertension Mother    Heart disease Mother    COPD Mother    Ulcers Mother    Lung cancer Father    Hypertension Sister    Diabetes Sister    Arthritis Sister    Depression Sister    Miscarriages / Stillbirths Sister    Early death Maternal Grandmother    Tuberculosis Maternal Grandmother  Lung cancer Maternal Grandfather    Suicidality Maternal Grandfather    Early death Maternal Grandfather    Heart failure Paternal Grandmother    Heart attack Paternal Grandmother    Hypertension Paternal Grandmother    Hyperlipidemia Paternal Grandmother    Heart disease Paternal Grandmother    Heart disease Paternal Grandfather    Hyperlipidemia Paternal Grandfather    Hypertension Paternal Grandfather    Hyperlipidemia Brother    Hypertension Brother    Intellectual disability Brother    Kidney disease Brother    Heart attack Brother    Asthma Brother    Early death Brother 22   Hyperlipidemia Brother    Hypertension Brother    Intellectual disability Brother     Kidney disease Brother    Heart disease Brother    Alcohol abuse Brother    Heart attack Other    Stroke Neg Hx    Breast cancer Neg Hx     Social History:  reports that she has never smoked. She has never used smokeless tobacco. She reports that she does not currently use alcohol. She reports that she does not use drugs.  Allergies:  Allergies  Allergen Reactions   Bupropion Nausea Only and Other (See Comments)    Nausea and headaches    Sulfa Antibiotics Other (See Comments)    From childhood- reaction not recalled   Prednisone  Anxiety and Other (See Comments)    Made me not feel like myself, altered    Medications Prior to Admission  Medication Sig Dispense Refill Last Dose/Taking   amLODipine  (NORVASC ) 5 MG tablet Take 1 tablet (5 mg total) by mouth daily. 90 tablet 1 10/08/2023 at  8:30 AM   aspirin  EC 81 MG tablet Take 81 mg by mouth daily.   10/03/2023   carvedilol  (COREG ) 6.25 MG tablet Take 1 tablet (6.25 mg total) by mouth 2 (two) times daily with a meal. 180 tablet 1 10/08/2023 at  8:30 AM   cromolyn  (OPTICROM ) 4 % ophthalmic solution Place 1 drop into both eyes 4 (four) times daily as needed (itchy/watery eyes). 10 mL 5 10/07/2023   dicyclomine  (BENTYL ) 10 MG capsule TAKE 1 CAPSULE (10 MG TOTAL) BY MOUTH 4 TIMES A DAY BEFORE MEALS AND AT BEDTIME 90 capsule 1 10/07/2023 Evening   estradiol  (ESTRACE ) 0.1 MG/GM vaginal cream Apply a pea-sized amount to finger tip and rub in vaginal and urethral area 3x weekly 42.5 g 3 Past Week   isosorbide  mononitrate (IMDUR ) 60 MG 24 hr tablet TAKE 1 TABLET BY MOUTH EVERY DAY 90 tablet 2 10/06/2023   lisinopril -hydrochlorothiazide  (ZESTORETIC ) 20-12.5 MG tablet Take 1 tablet by mouth daily. 90 tablet 1 10/06/2023   meloxicam  (MOBIC ) 15 MG tablet Take 1 tablet (15 mg total) by mouth daily. 90 tablet 1 10/07/2023   montelukast  (SINGULAIR ) 10 MG tablet TAKE 1 TABLET BY MOUTH EVERYDAY AT BEDTIME 90 tablet 1 10/07/2023 Bedtime   rosuvastatin  (CRESTOR ) 20 MG  tablet Take 1 tablet (20 mg total) by mouth daily. 90 tablet 3 10/07/2023 Evening   sertraline  (ZOLOFT ) 100 MG tablet Take 1 tablet (100 mg total) by mouth at bedtime. 90 tablet 1 10/07/2023 Bedtime   tirzepatide  (MOUNJARO ) 7.5 MG/0.5ML Pen Inject 7.5 mg into the skin once a week. 6 mL 1 Past Month   Vitamin D , Ergocalciferol , (DRISDOL ) 1.25 MG (50000 UNIT) CAPS capsule Take 1 capsule (50,000 Units total) by mouth every 7 (seven) days. 12 capsule 4 Past Week   budesonide  (PULMICORT ) 0.5 MG/2ML  nebulizer solution Use twice a day in a sinus rinse bottle. 120 mL 2 Unknown   cetirizine  (ZYRTEC  ALLERGY) 10 MG tablet Take 1 tablet (10 mg total) by mouth daily. 30 tablet 5 Unknown   clindamycin (CLEOCIN T) 1 % external solution SMARTSIG:15 Milliliter(s) Topical Twice Daily PRN   Unknown   nitroGLYCERIN  (NITROSTAT ) 0.4 MG SL tablet Place 1 tablet (0.4 mg total) under the tongue every 5 (five) minutes as needed for chest pain. 25 tablet 3 Unknown   triamcinolone  cream (KENALOG ) 0.1 % SMARTSIG:sparingly Topical Twice Daily PRN   Unknown    Review of Systems  Constitutional: Negative.   Respiratory: Negative.    Cardiovascular: Negative.   Genitourinary: Negative.     Blood pressure (!) 144/67, pulse 66, temperature 98.1 F (36.7 C), temperature source Oral, resp. rate 18, height 5' 3 (1.6 m), weight 88.5 kg, SpO2 97%. Physical Exam Constitutional:      Appearance: Normal appearance.  Cardiovascular:     Rate and Rhythm: Normal rate and regular rhythm.  Pulmonary:     Effort: Pulmonary effort is normal.     Breath sounds: Normal breath sounds.  Abdominal:     General: Abdomen is flat. Bowel sounds are normal.     Palpations: Abdomen is soft.  Neurological:     General: No focal deficit present.     Mental Status: She is alert.  Psychiatric:        Mood and Affect: Mood normal.     Results for orders placed or performed during the hospital encounter of 10/08/23 (from the past 24 hours)   Hemoglobin A1c per protocol     Status: Abnormal   Collection Time: 10/08/23 10:30 AM  Result Value Ref Range   Hgb A1c MFr Bld 6.0 (H) 4.8 - 5.6 %   Mean Plasma Glucose 125.5 mg/dL  CBC     Status: Abnormal   Collection Time: 10/08/23 10:30 AM  Result Value Ref Range   WBC 3.9 (L) 4.0 - 10.5 K/uL   RBC 4.57 3.87 - 5.11 MIL/uL   Hemoglobin 11.8 (L) 12.0 - 15.0 g/dL   HCT 62.6 63.9 - 53.9 %   MCV 81.6 80.0 - 100.0 fL   MCH 25.8 (L) 26.0 - 34.0 pg   MCHC 31.6 30.0 - 36.0 g/dL   RDW 83.7 (H) 88.4 - 84.4 %   Platelets 343 150 - 400 K/uL   nRBC 0.0 0.0 - 0.2 %  Basic metabolic panel     Status: Abnormal   Collection Time: 10/08/23 10:30 AM  Result Value Ref Range   Sodium 137 135 - 145 mmol/L   Potassium 4.0 3.5 - 5.1 mmol/L   Chloride 107 98 - 111 mmol/L   CO2 20 (L) 22 - 32 mmol/L   Glucose, Bld 105 (H) 70 - 99 mg/dL   BUN 24 (H) 8 - 23 mg/dL   Creatinine, Ser 9.05 0.44 - 1.00 mg/dL   Calcium  9.1 8.9 - 10.3 mg/dL   GFR, Estimated >39 >39 mL/min   Anion gap 10 5 - 15  ABO/Rh     Status: None (Preliminary result)   Collection Time: 10/08/23 10:42 AM  Result Value Ref Range   ABO/RH(D) PENDING   Glucose, capillary     Status: Abnormal   Collection Time: 10/08/23 10:42 AM  Result Value Ref Range   Glucose-Capillary 106 (H) 70 - 99 mg/dL  Type and screen     Status: None (Preliminary result)   Collection Time:  10/08/23 10:47 AM  Result Value Ref Range   ABO/RH(D) PENDING    Antibody Screen PENDING    Sample Expiration      10/11/2023,2359 Performed at [facility], [address] [place], [place] [address]     No results found.  Assessment/Plan: 68 yo G1P1 MWF with left cystic adnexal mass here for laparoscopic removal.  She does want both ovaries and tubes removed if possible.  Consent updated.  Pt ready to proceed.    Ronal GORMAN Pinal 10/08/2023, 11:55 AM

## 2023-10-08 NOTE — Anesthesia Preprocedure Evaluation (Signed)
 Anesthesia Evaluation  Patient identified by MRN, date of birth, ID band Patient awake    Reviewed: Allergy & Precautions, NPO status , Patient's Chart, lab work & pertinent test results  History of Anesthesia Complications (+) PONV and history of anesthetic complications  Airway Mallampati: II  TM Distance: >3 FB Neck ROM: Full    Dental no notable dental hx. (+) Teeth Intact   Pulmonary neg pulmonary ROS, sleep apnea and Continuous Positive Airway Pressure Ventilation , neg COPD, Patient abstained from smoking.Not current smoker   Pulmonary exam normal breath sounds clear to auscultation       Cardiovascular Exercise Tolerance: Good METShypertension, Pt. on medications (-) angina + CAD and + Cardiac Stents  (-) Past MI (-) dysrhythmias  Rhythm:Regular Rate:Normal - Systolic murmurs Per telephone cardiology consultation, patient is at 0.9% risk of MACE, acceptable to proceed without further testing.   Neuro/Psych  PSYCHIATRIC DISORDERS  Depression    negative neurological ROS     GI/Hepatic ,neg GERD  ,,(+)     (-) substance abuse    Endo/Other  diabetes  GLP1 agonist last taken > 7 days ago, denies GI symptoms today  Renal/GU negative Renal ROS     Musculoskeletal   Abdominal  (+) + obese  Peds  Hematology   Anesthesia Other Findings Past Medical History: 11/11/2012: Achilles bursitis or tendinitis     Comment:  right  No date: Allergy No date: Angina at rest [facility])     Comment:  Dr Jeffrie No date: CAD (coronary artery disease), native coronary artery     Comment:  DES to LAD in 2 areas, one at the bifurcation of a               diagonal branch and also in the mid to distal region. Her              diagonal branch was also ballooned . She has a DES to RCA              10/10. Her circumflex artery mild ostial disease. No date: Chicken pox No date: COVID-19     Comment:  x2 No date: Depression No date: DM  (diabetes mellitus) (HCC) 09/08/2020: Epistaxis 03/23/2020: Glossodynia 04/13/2020: Hemorrhoids No date: Hx of cardiovascular stress test     Comment:  ETT- Myoview  (2/16):  No ischemia, EF 69%, normal study No date: Hyperlipidemia No date: Hypertension 07/27/2019: Insomnia No date: Lumbar pain No date: OSA on CPAP     Comment:  Mild, mild hypoxia 83% AHI -11.1, O2 minimum 83% 11/11/2012: Peroneal tendinitis of left lower extremity     Comment:  Left with tear.  No date: PONV (postoperative nausea and vomiting) 08/12/2018: Tonsillar calculus 07/02/2018: Transient neurologic deficit 03/23/2020: Tremor  Reproductive/Obstetrics                              Anesthesia Physical Anesthesia Plan  ASA: 3  Anesthesia Plan: General   Post-op Pain Management: Tylenol  PO (pre-op )*   Induction: Intravenous  PONV Risk Score and Plan: 4 or greater and Ondansetron , Dexamethasone , Midazolam , TIVA and Propofol  infusion  Airway Management Planned: Oral ETT  Additional Equipment: None  Intra-op Plan:   Post-operative Plan: Extubation in OR  Informed Consent: I have reviewed the patients History and Physical, chart, labs and discussed the procedure including the risks, benefits and alternatives for the proposed anesthesia with the patient or authorized representative who has  indicated his/her understanding and acceptance.     Dental advisory given  Plan Discussed with: CRNA and Surgeon  Anesthesia Plan Comments: (Discussed risks of anesthesia with patient, including PONV, sore throat, lip/dental/eye damage, post operative cognitive dysfunction. Rare risks discussed as well, such as cardiorespiratory and neurological sequelae, and allergic reactions. Discussed the role of CRNA in patient's perioperative care. Patient understands.)        Anesthesia Quick Evaluation

## 2023-10-08 NOTE — Op Note (Addendum)
 10/08/2023  2:34 PM  PATIENT:  Kristie Owens  68 y.o. female  PRE-OPERATIVE DIAGNOSIS:  left adnexal mass  POST-OPERATIVE DIAGNOSIS:  left adnexal mass  PROCEDURE:  Procedure(s): BILATERAL SALPINGO-OOPHORECTOMY, LAPAROSCOPIC , WASHINGS, EXCISION OF RETROPERITONEAL LEFT SIDE WALL CYST, 7.5cm  SURGEON:  Ronal GORMAN Pinal  ASSISTANTS: Dr. Bebe Furry.  An experienced assistant was required given the standard of surgical care given the complexity of the case.  This assistant was needed for exposure, dissection, suctioning, retraction, instrument exchange and for overall help during the procedure.  RNFA help was also unavailable.  ANESTHESIA:   general  ESTIMATED BLOOD LOSS: 5 mL  BLOOD ADMINISTERED:none   FLUIDS: 1000cc LR  UOP: 150cc clear UOP  SPECIMEN:  left sidewall inclusion cyst 7.5cm, bilateral fallopian tubes and ovaries, cytologic washings  DISPOSITION OF SPECIMEN:  PATHOLOGY  FINDINGS: retroperitoneal left sidewall cyst measuring 7.5cm enlarged ovaries with small cysts present, normal fallopian tubes. Normal uterus  DESCRIPTION OF OPERATION: Patient is taken to the operating room. She is placed in the supine position. She is a running IV in place. Informed consent was present on the chart. SCDs on her lower extremities and functioning properly. Patient was positioned while she was awake.  Her legs were placed in the low lithotomy position in [place] stirrups. Her arms were tucked by the side.  General endotracheal anesthesia was administered by the anesthesia staff without difficulty. Dr. Boone, anesthesia, oversaw case.  Time out performed.    Clora prep was then used to prep the abdomen and Hibiclens  was used to prep the inner thighs, perineum and vagina. Once 3 minutes had past the patient was draped in a normal standard fashion. The legs were lifted to the high lithotomy position. The cervix was visualized by placing a heavy weighted speculum in the posterior aspect of the  vagina and using a curved Deaver retractor to the retract anteriorly. The anterior lip of the cervix was grasped with single-tooth tenaculum.  A uterine sound could not be passed.  Os finder obtained.  There was stenosis that felt like it was at the internal os.  Acorn uterine manipulator attached to the tenaculum as a means of manipulating the uterus during the procedure.  Speculum was removed.  A Foley catheter was placed to straight drain.  Clear urine was noted. Legs were lowered to the low lithotomy position and attention was turned the abdomen.  The umbilicus was everted.  Marcaine  0.25% used to anesthetize the skin.  Using #11 blade, 5mm skin incision was made.  A Veress needle was obtained. Syringe of sterile saline was placed on a open Veress needle.  With the abdomen elevated, the Veress needle was passed into the umbilicus until the pop was heard but fluid did not drip.  Given central obesity of this pt, decision made to proceed with LUQ placement.  Veress needle removed.  OG tube had already been placed.   Location for LUQ incision made in midclavicular line and 2cm beneath the costal margin.  5mm laparoscope with optiview port passed under direct visualization.  Intra-abdominal placement was easily performed and confirmed with the laparoscope.  Pneumoperitoneum was achieved without difficulty.    Locations for RLQ and LLQ port placement were were noted by transillumination of the abdominal wall.  0.25% marcaine  was used to anesthetize the skin.  5mm skin incision was made in both the RLQ and LLQ.  Then 5mm nonbladed trochars and ports were placed under direct visualization of the laparoscopy.   All  trochars were removed.    Cytological washings were obtained.  Ureters were identifies.  Attention was turned to the left side. The peritoneum on top of the left sidewall cyst was incised using the Harmonic scalpel.  Using pennington graspers, the filmy tissue around the cyst was freed keeping the cyst  intact.  The Harmonic scalpel was used to cauterize any vasculature.  Once the cyst was freed, this was placed in the upper portion of the abdomen.    Then with the uterus on stretch the left IP ligament was serially clamped, cauterized and incised.  Then the mesosalpinx was dissected to free the tube. Then the left utero-ovarian pedicle was serially clamped cauterized and incised using the ligasure device.  Attention was turned the right side.  The uterus was placed on stretch to the opposite side.  The right IP ligament was clamped, cauterized and incised with the LIgasure device.  The mesosalpinx was incised freeing the tube. Then the right uterine ovarian pedicle was serially clamped cauterized and incised.   Decision made to place specimens in surgical bag.  Umbilical incision was already placed at the beginning of the procedure.  A #11 non bladed trochar and port were placed under direct visualization.  The trochar was removed.  Specimens were placed in the bag and brought up to the skin.  The cyst was incised and drained with no spill.  Then the ovaries and fallopian tubes were removed.  The unbiblical trochar was then removed and the incision closed at the fascial layer.    The pelvis was irrigated. All pedicles were inspected. No bleeding was noted.   Co2 pressures were lowered to 8mm Hg.  Again, no bleeding was noted.  Ureters were noted deep in the pelvis to be peristalsing.  At this point the procedure was completed.  The remaining instruments were removed.  The ports (except the RLQ port) were removed under direct visualization of the laparoscope and the pneumoperitoneum was relieved.  The patient was taken out of Trendelenburg positioning.  Several deep breaths were given to the patient's trying to any gas the abdomen and finally the suprapubic port was removed.  The skin was then closed with subcuticular stitches of 3-0 Vicryl. The skin was cleansed Dermabond was applied. Attention was then  turned the vagina.  Tenaculum and manipulator were removed.  Foley catheter was removed.  Sponge, lap, needle, instrument counts were correct x2. Patient tolerated the procedure very well. She was awakened from anesthesia, extubated and taken to recovery in stable condition.    COUNTS:  YES  PLAN OF CARE: Transfer to PACU

## 2023-10-08 NOTE — Anesthesia Postprocedure Evaluation (Signed)
 Anesthesia Post Note  Patient: Kristie Owens  Procedure(s) Performed: BILATERAL SALPINGO-OOPHORECTOMY, LAPAROSCOPIC , WASHINGS, EXCISION OF LEFT SIDE WALL CYST     Patient location during evaluation: PACU Anesthesia Type: General Level of consciousness: awake and alert Pain management: pain level controlled Vital Signs Assessment: post-procedure vital signs reviewed and stable Respiratory status: spontaneous breathing, nonlabored ventilation, respiratory function stable and patient connected to nasal cannula oxygen Cardiovascular status: blood pressure returned to baseline and stable Postop Assessment: no apparent nausea or vomiting Anesthetic complications: no   No notable events documented.  Last Vitals:  Vitals:   10/08/23 1515 10/08/23 1516  BP: (!) (P) 124/91 (!) 124/91  Pulse: 68 70  Resp: 13 17  Temp:  (P) 36.8 C  SpO2: (!) (P) 88% 92%    Last Pain:  Vitals:   10/08/23 1600  TempSrc:   PainSc: 4                  R[place] Ade

## 2023-10-08 NOTE — Anesthesia Procedure Notes (Addendum)
 Procedure Name: Intubation Date/Time: 10/08/2023 12:14 PM  Performed by: Viviana Almarie DASEN, CRNAPre-anesthesia Checklist: Patient identified, Emergency Drugs available, Suction available and Patient being monitored Patient Re-evaluated:Patient Re-evaluated prior to induction Oxygen Delivery Method: Circle System Utilized Preoxygenation: Pre-oxygenation with 100% oxygen Induction Type: IV induction Ventilation: Mask ventilation without difficulty and Two handed mask ventilation required Laryngoscope Size: Mac and 3 Grade View: Grade II Tube type: Oral Tube size: 6.5 mm Number of attempts: 1 Airway Equipment and Method: Stylet, Oral airway and Bite block Placement Confirmation: ETT inserted through vocal cords under direct vision, positive ETCO2 and breath sounds checked- equal and bilateral Secured at: 21 cm Tube secured with: Tape Dental Injury: Teeth and Oropharynx as per pre-operative assessment

## 2023-10-08 NOTE — Transfer of Care (Signed)
 Immediate Anesthesia Transfer of Care Note  Patient: Kristie Owens  Procedure(s) Performed: BILATERAL SALPINGO-OOPHORECTOMY, LAPAROSCOPIC , WASHINGS, EXCISION OF LEFT SIDE WALL CYST  Patient Location: PACU  Anesthesia Type:General  Level of Consciousness: awake, oriented, and patient cooperative  Airway & Oxygen Therapy: Patient Spontanous Breathing and Patient connected to face mask oxygen  Post-op Assessment: Report given to RN, Post -op Vital signs reviewed and stable, Patient moving all extremities X 4, and Patient able to stick tongue midline  Post vital signs: Reviewed and stable  Last Vitals:  Vitals Value Taken Time  BP 143/62 10/08/23 14:12  Temp 36.7 C 10/08/23 14:12  Pulse 68 10/08/23 14:13  Resp 20   SpO2 96 % 10/08/23 14:13  Vitals shown include unfiled device data.  Last Pain:  Vitals:   10/08/23 1017  TempSrc: Oral  PainSc: 0-No pain      Patients Stated Pain Goal: 5 (10/08/23 1017)  Complications: No notable events documented.

## 2023-10-08 NOTE — Discharge Instructions (Addendum)
 Post-surgical Instructions, Outpatient Surgery  You may expect to feel dizzy, weak, and drowsy for as long as 24 hours after receiving the medicine that made you sleep (anesthetic). For the first 24 hours after your surgery:   Do not drive a car, ride a bicycle, participate in physical activities, or take public transportation until you are done taking narcotic pain medicines or as directed by Dr. Cleotilde.  Do not drink alcohol or take tranquilizers.  Do not take medicine that has not been prescribed by your physicians.  Do not sign important papers or make important decisions while on narcotic pain medicines.  Have a responsible person with you.   CARE OF INCISION If you have a bandage, you may remove it in one day.  If there are steri-strips or dermabond, just let this loosen on its own.  You may shower on the first day after your surgery.  Do not sit in a tub bath for one week. Avoid heavy lifting (more than 10 pounds/4.5 kilograms), pushing, or pulling.  Avoid activities that may risk injury to your incisions.   PAIN MANAGEMENT.   Vicodin 5/325mg .  For more severe pain, take one or two tablets every four to six hours as needed for pain control.  (Remember that narcotic pain medications increase your risk of constipation.  If this becomes a problem, you may take an over the counter stool softener like Colace 100mg  up to four times a day.) If you do not feel you need the narcotic pain medication, you can just take 2 tylenol  tablets every 6 hours.    DO'S AND DON'T'S Do not take a tub bath for one week.  You may shower on the first day after your surgery Do not do any heavy lifting for one to two weeks.  This increases the chance of bleeding. Do move around as you feel able.  Stairs are fine.  You may begin to exercise again as you feel able.  Do not lift any weights for two weeks. Do not put anything in the vagina for two weeks--no tampons, intercourse, or douching.    REGULAR  MEDIATIONS/VITAMINS: You may restart all of your regular medications as prescribed. You may restart all of your vitamins as you normally take them.    PLEASE CALL OR SEEK MEDICAL CARE IF: You have persistent nausea and vomiting.  You have trouble eating or drinking.  You have an oral temperature above 100.5.  You have constipation that is not helped by adjusting diet or increasing fluid intake. Pain medicines are a common cause of constipation.  You have heavy vaginal bleeding You have redness or drainage from your incision(s) or there is increasing pain or tenderness near or in the surgical site.    Post Anesthesia Home Care Instructions  Activity: Get plenty of rest for the remainder of the day. A responsible individual must stay with you for 24 hours following the procedure.  For the next 24 hours, DO NOT: -Drive a car -Advertising copywriter -Drink alcoholic beverages -Take any medication unless instructed by your physician -Make any legal decisions or sign important papers.  Meals: Start with liquid foods such as gelatin or soup. Progress to regular foods as tolerated. Avoid greasy, spicy, heavy foods. If nausea and/or vomiting occur, drink only clear liquids until the nausea and/or vomiting subsides. Call your physician if vomiting continues.  Special Instructions/Symptoms: Your throat may feel dry or sore from the anesthesia or the breathing tube placed in your throat during surgery. If  this causes discomfort, gargle with warm salt water. The discomfort should disappear within 24 hours.

## 2023-10-09 ENCOUNTER — Encounter (HOSPITAL_COMMUNITY): Payer: Self-pay | Admitting: Obstetrics & Gynecology

## 2023-10-09 LAB — SURGICAL PATHOLOGY

## 2023-10-09 LAB — CYTOLOGY - NON PAP

## 2023-10-14 ENCOUNTER — Encounter: Payer: Self-pay | Admitting: Family Medicine

## 2023-10-14 ENCOUNTER — Ambulatory Visit: Admitting: Family Medicine

## 2023-10-14 VITALS — BP 128/79 | HR 73 | Temp 98.4°F | Wt 193.8 lb

## 2023-10-14 DIAGNOSIS — E1169 Type 2 diabetes mellitus with other specified complication: Secondary | ICD-10-CM | POA: Diagnosis not present

## 2023-10-14 DIAGNOSIS — I25119 Atherosclerotic heart disease of native coronary artery with unspecified angina pectoris: Secondary | ICD-10-CM

## 2023-10-14 DIAGNOSIS — I10 Essential (primary) hypertension: Secondary | ICD-10-CM

## 2023-10-14 DIAGNOSIS — F3340 Major depressive disorder, recurrent, in remission, unspecified: Secondary | ICD-10-CM

## 2023-10-14 DIAGNOSIS — E785 Hyperlipidemia, unspecified: Secondary | ICD-10-CM

## 2023-10-14 DIAGNOSIS — J3089 Other allergic rhinitis: Secondary | ICD-10-CM

## 2023-10-14 DIAGNOSIS — Z23 Encounter for immunization: Secondary | ICD-10-CM

## 2023-10-14 DIAGNOSIS — H1010 Acute atopic conjunctivitis, unspecified eye: Secondary | ICD-10-CM

## 2023-10-14 DIAGNOSIS — Z955 Presence of coronary angioplasty implant and graft: Secondary | ICD-10-CM

## 2023-10-14 DIAGNOSIS — I200 Unstable angina: Secondary | ICD-10-CM

## 2023-10-14 DIAGNOSIS — J302 Other seasonal allergic rhinitis: Secondary | ICD-10-CM

## 2023-10-14 MED ORDER — ROSUVASTATIN CALCIUM 20 MG PO TABS: 20.0000 mg | ORAL_TABLET | Freq: Every day | ORAL | 3 refills | Status: DC

## 2023-10-14 MED ORDER — VITAMIN D (ERGOCALCIFEROL) 1.25 MG (50000 UNIT) PO CAPS: 50000.0000 [IU] | ORAL_CAPSULE | ORAL | 4 refills | Status: AC

## 2023-10-14 MED ORDER — CARVEDILOL 6.25 MG PO TABS: 6.2500 mg | ORAL_TABLET | Freq: Two times a day (BID) | ORAL | 1 refills | Status: DC

## 2023-10-14 MED ORDER — LISINOPRIL-HYDROCHLOROTHIAZIDE 20-12.5 MG PO TABS: 1.0000 | ORAL_TABLET | Freq: Every day | ORAL | 1 refills | Status: DC

## 2023-10-14 MED ORDER — SERTRALINE HCL 100 MG PO TABS: 100.0000 mg | ORAL_TABLET | Freq: Every evening | ORAL | 1 refills | Status: DC

## 2023-10-14 MED ORDER — TIRZEPATIDE 10 MG/0.5ML ~~LOC~~ SOAJ: 10.0000 mg | SUBCUTANEOUS | 1 refills | Status: DC

## 2023-10-14 MED ORDER — AMLODIPINE BESYLATE 5 MG PO TABS: 5.0000 mg | ORAL_TABLET | Freq: Every day | ORAL | 1 refills | Status: DC

## 2023-10-14 NOTE — Patient Instructions (Addendum)
 Return in about 17 weeks (around 02/10/2024) for Routine chronic condition follow-up.        Great to see you today.  I have refilled the medication(s) we provide.   If labs were collected or images ordered, we will inform you of  results once we have received them and reviewed. We will contact you either by echart message, or telephone call.  Please give ample time to the testing facility, and our office to run,  receive and review results. Please do not call inquiring of results, even if you can see them in your chart. We will contact you as soon as we are able. If it has been over 1 week since the test was completed, and you have not yet heard from us , then please call us .    - echart message- for normal results that have been seen by the patient already.   - telephone call: abnormal results or if patient has not viewed results in their echart.  If a referral to a specialist was entered for you, please call us  in 2 weeks if you have not heard from the specialist office to schedule.

## 2023-10-14 NOTE — Progress Notes (Signed)
 Patient ID: Kristie Owens, female  DOB: February 04, 1955, 68 y.o.   MRN: [id number] Patient Care Team    Relationship Specialty Notifications Start End  Catherine Charlies LABOR, DO PCP - General Family Medicine  06/09/19   Jeffrie Oneil BROCKS, MD PCP - Cardiology Cardiology  12/17/19   Jeffrie Oneil BROCKS, MD Consulting Physician Cardiology  06/08/19   Elspeth Lauraine DEL, OD Referring Physician   06/11/19   Jalene Francis DEL Physician Assistant Chiropractic Medicine  06/11/19   Elspeth Lauraine DEL, OD Referring Physician   03/13/21     Chief Complaint  Patient presents with   Hypertension   Hyperlipidemia    Subjective: Kristie Owens is a 68 y.o.  Female  present for chronic condition manage All past medical history, surgical history, allergies, family history, immunizations, medications and social history were updated in the electronic medical record today. All recent labs, ED visits and hospitalizations within the last year were reviewed.   Hypertension/hyperlipidemia/coronary artery disease/TIA: Pt reports compliance with lisinopril -HCTZ 20-12.5, amlodipine  5 mg, Coreg  6.25 mg twice daily, Crestor  20 mg daily. Patient denies chest pain, shortness of breath, dizziness or lower extremity edema.    Patient is established with Dr. Jeffrie for cardiology.  Has a history of coronary artery disease status post heart catheterization in December 2012 and May 2018 demonstrating widely patent LAD and RCA stent, mild irregularity of the ostium of the diagonal where PTCA was felt to be high risk RF: Hypertension, hyperlipidemia, obesity, family history of heart disease   Echocardiogram 12/07/2019 IMPRESSIONS   1. Left ventricular ejection fraction, by estimation, is 65 to 70%. The  left ventricle has normal function. The left ventricle has no regional  wall motion abnormalities. There is mild left ventricular hypertrophy.  Left ventricular diastolic parameters  are indeterminate.   2. Right ventricular systolic  function is normal. The right ventricular  size is normal. Tricuspid regurgitation signal is inadequate for assessing  PA pressure.   3. The mitral valve is normal in structure. No evidence of mitral valve  regurgitation.   4. The aortic valve was not well visualized. Aortic valve regurgitation  is not visualized. Mild aortic valve sclerosis is present, with no  evidence of aortic valve stenosis   Cardiac catheterization on  12/04/2019 1. Patent proximal and mid LAD stents 2. Small caliber circumflex with mild to moderate non-obstructive disease 3. Large dominant RCA with patent mid stent, mild proximal stenosis. Moderate disease in the PDA and posterolateral artery, unchanged from last cath.  4. Normal LV systolic function, LVEF=65-70%. LVEDP 16   Recommendations: Continue medical management of CAD.  06/04/16: Widely patent proximal and distal LAD stents. 40% ostial narrowing in the first diagonal. Patent mid right coronary stents. Proximal 40% RCA eccentric narrowing. Circumflex is a small vessel with 40-50% first obtuse marginal. Ramus intermedius (or first obtuse marginal depending upon categorization) contains 40% proximal narrowing. Normal LV function. Elevated LVEDP at 23 mmHg. EF 55%.   Depression/anxiety/insomnia: Patient has a history of depression with anxiety, as well as insomnia.  She reports compliance with Zoloft  100 mg daily.  She feels this medication is working well for her Tried for insomnia: Belsomra , Ambien , Remeron , trazodone , vistaril . Nothing has worked well for her.    Allergic rhinitis: Patient reports compliance with Singulair  and dymista  nasal spray.  She is taking allegra.    Diabetes type 2: Patient has history of mildly elevated A1c in the diabetic range. She is watching her diet  and metformin  recently discontinued secondary to diarrhea of other causes.  Tolerating Mounjaro  7.5 mg weekly Patient denies dizziness, hyperglycemic or hypoglycemic events.  Patient denies numbness, tingling in the extremities or nonhealing wounds of feet.        10/14/2023    8:20 AM 07/01/2023    8:45 AM 04/11/2023    9:07 AM 03/18/2023    8:42 AM 08/08/2022   11:23 AM  Depression screen PHQ 2/9  Decreased Interest 0 0 0 0 0  Down, Depressed, Hopeless 0 0 0 0 0  PHQ - 2 Score 0 0 0 0 0  Altered sleeping 3 2  3    Tired, decreased energy 1 1  1    Change in appetite 0 1  0   Feeling bad or failure about yourself  0 0  0   Trouble concentrating 0 0  0   Moving slowly or fidgety/restless 0 0  0   Suicidal thoughts 0 0  0   PHQ-9 Score 4 4  4    Difficult doing work/chores Somewhat difficult Not difficult at all  Somewhat difficult       10/14/2023    8:20 AM 07/01/2023    8:45 AM 03/18/2023    8:43 AM 03/12/2022    8:05 AM  GAD 7 : Generalized Anxiety Score  Nervous, Anxious, on Edge 0 0 0 0  Control/stop worrying 0 0 0 0  Worry too much - different things 0 0 0 0  Trouble relaxing 0 2 0 0  Restless 0 0 0 0  Easily annoyed or irritable 0 0 0 0  Afraid - awful might happen 0 0 0 0  Total GAD 7 Score 0 2 0 0  Anxiety Difficulty Not difficult at all Somewhat difficult Not difficult at all     Immunization History  Administered Date(s) Administered   Fluad Quad(high Dose 65+) 10/19/2019, 11/21/2020, 12/27/2021   Fluad Trivalent(High Dose 65+) 11/12/2022   Fluzone Influenza virus vaccine,trivalent (IIV3), split virus 12/28/2010, 11/22/2011, 11/13/2012   INFLUENZA, HIGH DOSE SEASONAL PF 11/21/2018, 10/14/2023   Influenza,inj,Quad PF,6+ Mos 12/15/2013, 12/13/2016, 11/21/2017   Influenza,inj,quad, With Preservative 09/19/2015   Influenza-Unspecified 11/21/2018   Moderna Covid-19 Vaccine Bivalent Booster 33yrs & up 03/27/2021   Moderna Sars-Covid-2 Vaccination 02/19/2019, 03/27/2019, 12/19/2019   Pneumococcal Conjugate-13 11/21/2017   Pneumococcal Polysaccharide-23 11/25/2018   Tdap 08/10/2013, 03/18/2023   Zoster Recombinant(Shingrix) 12/30/2019,  11/21/2020   Past Medical History:  Diagnosis Date   Achilles bursitis or tendinitis 11/11/2012   right    Allergy    Angina at rest [facility])    Dr Jeffrie   CAD (coronary artery disease), native coronary artery    DES to LAD in 2 areas, one at the bifurcation of a diagonal branch and also in the mid to distal region. Her diagonal branch was also ballooned . She has a DES to RCA 10/10. Her circumflex artery mild ostial disease.   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   Tremor 03/23/2020   Allergies  Allergen Reactions   Bupropion Nausea Only and Other (See Comments)    Nausea and headaches    Sulfa Antibiotics Other (See Comments)    From childhood- reaction not recalled   Prednisone  Anxiety and Other (See Comments)    Made me not feel like myself, altered   Past Surgical History:  Procedure Laterality Date   BREAST EXCISIONAL BIOPSY Right 1995   and in 1996   CAROTID STENT  2010   3 done wtithin a few weeks   CESAREAN SECTION  1983   FOOT SURGERY     LAPAROSCOPIC SALPINGO OOPHERECTOMY N/A 10/08/2023   Procedure: BILATERAL SALPINGO-OOPHORECTOMY, LAPAROSCOPIC , WASHINGS, EXCISION OF LEFT SIDE WALL CYST;  Surgeon: Cleotilde Ronal RAMAN, MD;  Location: [facility] OR;  Service: Gynecology;  Laterality: N/A;   LEFT HEART CATH AND CORONARY ANGIOGRAPHY N/A 06/04/2016   Procedure: Left Heart Cath and Coronary Angiography;  Surgeon: Claudene Victory ORN, MD;  Location:  [facility] INVASIVE CV LAB;  Service: Cardiovascular;  Laterality: N/A;   LEFT HEART CATH AND CORONARY ANGIOGRAPHY N/A 12/04/2019   Procedure: LEFT HEART CATH AND CORONARY ANGIOGRAPHY;  Surgeon: Verlin Lonni BIRCH, MD;  Location: [facility] INVASIVE CV LAB;  Service: Cardiovascular;  Laterality: N/A;   LEFT HEART CATHETERIZATION WITH CORONARY ANGIOGRAM N/A 01/16/2011   Procedure: LEFT HEART CATHETERIZATION WITH CORONARY ANGIOGRAM;  Surgeon: Oneil Parchment, MD;  Location: [facility] CATH LAB;  Service: Cardiovascular;  Laterality: N/A;   NASAL SEPTUM SURGERY  2000   SINOSCOPY     Family History  Problem Relation Age of Onset   Hypertension Mother    Heart disease Mother    COPD Mother    Ulcers Mother    Lung cancer Father    Hypertension Sister    Diabetes Sister    Arthritis Sister    Depression Sister    Miscarriages / [place] Sister    Early death Maternal Grandmother    Tuberculosis Maternal Grandmother    Lung cancer Maternal Grandfather    Suicidality Maternal Grandfather    Early death Maternal Grandfather    Heart failure Paternal Grandmother    Heart attack Paternal Grandmother    Hypertension Paternal Grandmother    Hyperlipidemia Paternal Grandmother    Heart disease Paternal Grandmother    Heart disease Paternal Grandfather    Hyperlipidemia Paternal Grandfather    Hypertension Paternal Grandfather    Hyperlipidemia Brother    Hypertension Brother    Intellectual disability Brother    Kidney disease Brother    Heart attack Brother    Asthma Brother    Early death Brother 58   Hyperlipidemia Brother    Hypertension Brother    Intellectual disability Brother    Kidney disease Brother    Heart disease Brother    Alcohol abuse Brother    Heart attack Other    Stroke Neg Hx    Breast cancer Neg Hx    Social History   Social History Narrative   Marital status/children/pets: Married.  One child.   Education/employment: Retired.  Associates degree in college.   Safety:       -Wears a bicycle helmet riding a bike: Yes     -smoke alarm in the home:Yes     - wears seatbelt: Yes     - Feels safe in their relationships: Yes  Allergies as of 10/14/2023       Reactions   Bupropion Nausea Only, Other (See Comments)   Nausea and headaches    Sulfa Antibiotics Other (See Comments)   From childhood- reaction not recalled   Prednisone  Anxiety, Other (See Comments)   Made me not feel like myself, altered        Medication List        Accurate as of October 14, 2023  8:49 AM. If you have any questions, ask your nurse or doctor.          amLODipine  5 MG tablet Commonly known as: NORVASC  Take 1 tablet (5 mg total) by mouth daily.   aspirin  EC 81 MG tablet Take 81 mg by mouth daily.   atropine  1 % ophthalmic solution SMARTSIG:In Eye(s)   budesonide  0.5 MG/2ML nebulizer solution Commonly known as: Pulmicort  Use twice a day in a sinus rinse bottle.   carvedilol  6.25 MG tablet Commonly known as: COREG  Take 1 tablet (6.25 mg total) by mouth 2 (two) times daily with a meal.   cetirizine  10 MG tablet Commonly known as: ZyrTEC  Allergy Take 1 tablet (10 mg total) by mouth daily.   clindamycin 1 % external solution Commonly known as: CLEOCIN T SMARTSIG:15 Milliliter(s) Topical Twice Daily PRN   cromolyn  4 % ophthalmic solution Commonly known as: OPTICROM  Place 1 drop into both eyes 4 (four) times daily as needed (itchy/watery eyes).   dicyclomine  10 MG capsule Commonly known as: BENTYL  TAKE 1 CAPSULE (10 MG TOTAL) BY MOUTH 4 TIMES A DAY BEFORE MEALS AND AT BEDTIME   estradiol  0.1 MG/GM vaginal cream Commonly known as: ESTRACE  Apply a pea-sized amount to finger tip and rub in vaginal and urethral area 3x weekly   HYDROcodone -acetaminophen  5-325 MG tablet Commonly known as: NORCO/VICODIN Take 1-2 tablets by mouth every 6 (six) hours as needed for moderate pain (pain score 4-6).   isosorbide  mononitrate 60 MG 24 hr tablet Commonly known  as: IMDUR  TAKE 1 TABLET BY MOUTH EVERY DAY   lisinopril -hydrochlorothiazide  20-12.5 MG tablet Commonly known as: ZESTORETIC  Take 1 tablet by mouth daily.   meloxicam  15 MG tablet Commonly known as: MOBIC  Take 1 tablet (15 mg total) by mouth daily.   montelukast  10 MG tablet Commonly known as: SINGULAIR  TAKE 1 TABLET BY MOUTH EVERYDAY AT BEDTIME   nitroGLYCERIN  0.4 MG SL tablet Commonly known as: NITROSTAT  Place 1 tablet (0.4 mg total) under the tongue every 5 (five) minutes as needed for chest pain.   rosuvastatin  20 MG tablet Commonly known as: CRESTOR  Take 1 tablet (20 mg total) by mouth daily.   sertraline  100 MG tablet Commonly known as: ZOLOFT  Take 1 tablet (100 mg total) by mouth at bedtime.   tirzepatide  7.5 MG/0.5ML Pen Commonly known as: MOUNJARO  Inject 7.5 mg into the skin once a week. What changed: Another medication with the same name was added. Make sure you understand how and when to take each. Changed by: Charlies Bellini   tirzepatide  10 MG/0.5ML Pen Commonly known as: MOUNJARO  Inject 10 mg into the skin once a week. What changed: You were already taking a medication with the same name, and this prescription was added. Make sure you understand how and when to take each. Changed by: Tenicia Gural   tobramycin-dexamethasone  ophthalmic solution Commonly known as: TOBRADEX INSTILL 1 DROP INTO BOTH EYES 4 TIMES A DAY FOR 7 DAYS. SHAKE WELL   triamcinolone  cream 0.1 % Commonly known as: KENALOG  SMARTSIG:sparingly Topical Twice Daily  PRN   Vitamin D  (Ergocalciferol ) 1.25 MG (50000 UNIT) Caps capsule Commonly known as: DRISDOL  Take 1 capsule (50,000 Units total) by mouth every 7 (seven) days.        All past medical history, surgical history, allergies, family history, immunizations andmedications were updated in the EMR today and reviewed under the history and medication portions of their EMR.       ROS 14 pt review of systems performed and negative  (unless mentioned in an HPI)  Objective: BP 128/79   Pulse 73   Temp 98.4 F (36.9 C)   Wt 193 lb 12.8 oz (87.9 kg)   SpO2 98%   BMI 34.33 kg/m  Physical Exam Vitals and nursing note reviewed.  Constitutional:      General: She is not in acute distress.    Appearance: Normal appearance. She is not ill-appearing, toxic-appearing or diaphoretic.  HENT:     Head: Normocephalic and atraumatic.  Eyes:     General: No scleral icterus.       Right eye: No discharge.        Left eye: No discharge.     Extraocular Movements: Extraocular movements intact.     Conjunctiva/sclera: Conjunctivae normal.     Pupils: Pupils are equal, round, and reactive to light.  Cardiovascular:     Rate and Rhythm: Normal rate and regular rhythm.  Pulmonary:     Effort: Pulmonary effort is normal. No respiratory distress.     Breath sounds: Normal breath sounds. No wheezing, rhonchi or rales.  Musculoskeletal:     Right lower leg: No edema.     Left lower leg: No edema.  Skin:    General: Skin is warm.     Findings: No rash.  Neurological:     Mental Status: She is alert and oriented to person, place, and time. Mental status is at baseline.     Motor: No weakness.     Gait: Gait normal.  Psychiatric:        Mood and Affect: Mood normal.        Behavior: Behavior normal.        Thought Content: Thought content normal.        Judgment: Judgment normal.     Diabetic Foot Exam - Simple   No data filed      No results found.  Assessment/plan: Kristie Owens is a 68 y.o. female present for chronic condition manage Osteoarthritis Stable Continue Mobic  qd with food.  - continue  OTC voltaren gel/cream.  - discussed cherry tart extract and tumeric natural supplements and their SE potentials  - ANA, RF, CCP, ESR, CRP > WNL   Major depressive disorder, remission status unspecified, unspecified whether recurrent/insomnia Stable Continue Zoloft  100 mg daily. Discontinued Ambien  2021.   DCd  trazodone  taper 25-75 mg nightly.> not helpful Dc vistaril > not helpful and felt bad on higher dose.  DC'd belsomra  > discontinued> expensive,  low-dose worked okay but not well.   Diabetes type 2: Stable Patient encouraged to continue exercise and dietary modifications.  Discontinued metformin  for now secondary to  diarrhea of unknown cause Increase mounjaro  7.5 mg weekly> 10 mg weekly a1c improved today 5.7 (was 6.7)> 5.8> 6.6 > 5.9>5.5> 6.4 >5.5>6.0 (10/08/2023) Eye exam up-to-date 12/11/2022-reminded patient eye exam is due in 2 months Foot exam completed 10/14/2023 - Microalbumin / creatinine urine ratio-03/2023  Essential hypertension/hyperlipidemia/Presence of coronary angioplasty implant and graft/Atherosclerosis of native coronary artery of native heart with other form of  angina pectoris (HCC)/Intermediate coronary syndrome (HCC)/ Palpitations/Transient neurologic deficit/Morbid obesity (HCC) Stable Continue daily exercise. Continue low-sodium/heart healthy diet. Continue amlodipine  5 mg daily Continue Coreg  6.25 mg twice daily Continue lisinopril -HCTZ 20-12.5 mg daily Continue  Crestor  20 mg daily Continue baby aspirin  daily  Allergic rhinitis, unspecified seasonality, unspecified trigger Continue antihistamine of choice.  OTC  Continue Singulair  Est with A&A  Osteopenia, unspecified location/Vitamin D  deficiency - Vitamin D  (25 hydroxy) UTD/WNL -UTD 07/2022 T-score -1.8 repeat 2 years  Return in about 17 weeks (around 02/10/2024) for Routine chronic condition follow-up.  Orders Placed This Encounter  Procedures   Flu vaccine HIGH DOSE PF(Fluzone Trivalent)   Meds ordered this encounter  Medications   rosuvastatin  (CRESTOR ) 20 MG tablet    Sig: Take 1 tablet (20 mg total) by mouth daily.    Dispense:  90 tablet    Refill:  3   Vitamin D , Ergocalciferol , (DRISDOL ) 1.25 MG (50000 UNIT) CAPS capsule    Sig: Take 1 capsule (50,000 Units total) by mouth every 7 (seven)  days.    Dispense:  12 capsule    Refill:  4   sertraline  (ZOLOFT ) 100 MG tablet    Sig: Take 1 tablet (100 mg total) by mouth at bedtime.    Dispense:  90 tablet    Refill:  1   lisinopril -hydrochlorothiazide  (ZESTORETIC ) 20-12.5 MG tablet    Sig: Take 1 tablet by mouth daily.    Dispense:  90 tablet    Refill:  1   carvedilol  (COREG ) 6.25 MG tablet    Sig: Take 1 tablet (6.25 mg total) by mouth 2 (two) times daily with a meal.    Dispense:  180 tablet    Refill:  1   amLODipine  (NORVASC ) 5 MG tablet    Sig: Take 1 tablet (5 mg total) by mouth daily.    Dispense:  90 tablet    Refill:  1   tirzepatide  (MOUNJARO ) 10 MG/0.5ML Pen    Sig: Inject 10 mg into the skin once a week.    Dispense:  6 mL    Refill:  1   Referral Orders  No referral(s) requested today      Electronically signed by: Charlies Bellini, DO [facility]

## 2023-10-16 DIAGNOSIS — D279 Benign neoplasm of unspecified ovary: Secondary | ICD-10-CM

## 2023-10-17 ENCOUNTER — Ambulatory Visit (HOSPITAL_BASED_OUTPATIENT_CLINIC_OR_DEPARTMENT_OTHER): Payer: Self-pay | Admitting: Obstetrics & Gynecology

## 2023-11-04 ENCOUNTER — Encounter (HOSPITAL_BASED_OUTPATIENT_CLINIC_OR_DEPARTMENT_OTHER): Payer: Self-pay | Admitting: Obstetrics & Gynecology

## 2023-11-04 ENCOUNTER — Ambulatory Visit (HOSPITAL_BASED_OUTPATIENT_CLINIC_OR_DEPARTMENT_OTHER): Payer: Self-pay | Admitting: Obstetrics & Gynecology

## 2023-11-04 ENCOUNTER — Telehealth: Payer: Self-pay | Admitting: Gastroenterology

## 2023-11-04 VITALS — BP 126/66 | HR 74 | Ht 64.0 in | Wt 197.4 lb

## 2023-11-04 DIAGNOSIS — Q625 Duplication of ureter: Secondary | ICD-10-CM

## 2023-11-04 DIAGNOSIS — D271 Benign neoplasm of left ovary: Secondary | ICD-10-CM

## 2023-11-04 DIAGNOSIS — R197 Diarrhea, unspecified: Secondary | ICD-10-CM

## 2023-11-04 DIAGNOSIS — Z48816 Encounter for surgical aftercare following surgery on the genitourinary system: Secondary | ICD-10-CM

## 2023-11-04 DIAGNOSIS — K921 Melena: Secondary | ICD-10-CM

## 2023-11-04 DIAGNOSIS — R195 Other fecal abnormalities: Secondary | ICD-10-CM

## 2023-11-04 NOTE — Addendum Note (Signed)
 Addended by: CLEOTILDE RONAL RAMAN on: 11/04/2023 11:21 AM   Modules accepted: Level of Service

## 2023-11-04 NOTE — Telephone Encounter (Signed)
 Patient calls with complaints of explosive diarrhea which started Friday. States she had several episode of this followed by constipation/hard stools starting Saturday. She describes bright red blood mixed in stools but not on toilet tissue or toilet bowl. She also c/o lower abdominal cramping at times but not associated with bowel movements. Denies any fever/chills. No nausea/vomiting.   *does take Mounjaro  10mg .  Patient does have dicyclomine  on hand and says she has tried this but it has not been helpful for the cramps. No additional rectal bleeding noted today.  Patient wants sooner appointment, but no appointment available with Dr Stacia until 12/2. No APP availability until 11/2023. She has been scheduled tentatively for appointment with C.Kennedy-Lorenda Grecco, NP on 12/12/23 and placed on wait list.   She is advised that should she develop severe abdominal pain/cramping, increasing watery stool/diarrhea with blood, SOB, Dizziness, profound fatigue, she should be seen in the emergency room.

## 2023-11-04 NOTE — Progress Notes (Signed)
 GYNECOLOGY  VISIT  CC:   post op recheck  HPI: 68 y.o. G1P1 Married White or Caucasian female here for recheck after undergoing Laparoscopic Bilateral Salpingo-Oophorectomy on 10/08/2023.  She reports bleeding is none.  She has no pain. She does report some minimal cramping. Bowel function is Normal.  Bladder function is normal.  She did have some soreness after surgery.  She took one narcotic pain medication after surgery.  Did not have any bleeding.  Left sided pain/discomfort she was having before surgery is resolved.  Unrelated and just this weekend, she had some blood in the toilet after a bowel movement.  , she's having some issues with blood in her stool.  Having some urgency with bowel movements and some cramping.  She had constipation for a few days and then had diarrhea for a few days and then the blood in the stool.  She hasn't seen it in a few days but would like to see GI.  Had colonoscopy last August with Dr. Stacia.  She is established so can just call for appt.    Pathology reviewed:  Yes .  Questions answered.    MEDS:   Current Outpatient Medications on File Prior to Visit  Medication Sig Dispense Refill   amLODipine  (NORVASC ) 5 MG tablet Take 1 tablet (5 mg total) by mouth daily. 90 tablet 1   aspirin  EC 81 MG tablet Take 81 mg by mouth daily.     atropine  1 % ophthalmic solution SMARTSIG:In Eye(s)     budesonide  (PULMICORT ) 0.5 MG/2ML nebulizer solution Use twice a day in a sinus rinse bottle. 120 mL 2   carvedilol  (COREG ) 6.25 MG tablet Take 1 tablet (6.25 mg total) by mouth 2 (two) times daily with a meal. 180 tablet 1   cetirizine  (ZYRTEC  ALLERGY) 10 MG tablet Take 1 tablet (10 mg total) by mouth daily. 30 tablet 5   clindamycin (CLEOCIN T) 1 % external solution SMARTSIG:15 Milliliter(s) Topical Twice Daily PRN     cromolyn  (OPTICROM ) 4 % ophthalmic solution Place 1 drop into both eyes 4 (four) times daily as needed (itchy/watery eyes). 10 mL 5   dicyclomine  (BENTYL )  10 MG capsule TAKE 1 CAPSULE (10 MG TOTAL) BY MOUTH 4 TIMES A DAY BEFORE MEALS AND AT BEDTIME 90 capsule 1   estradiol  (ESTRACE ) 0.1 MG/GM vaginal cream Apply a pea-sized amount to finger tip and rub in vaginal and urethral area 3x weekly 42.5 g 3   isosorbide  mononitrate (IMDUR ) 60 MG 24 hr tablet TAKE 1 TABLET BY MOUTH EVERY DAY 90 tablet 2   lisinopril -hydrochlorothiazide  (ZESTORETIC ) 20-12.5 MG tablet Take 1 tablet by mouth daily. 90 tablet 1   meloxicam  (MOBIC ) 15 MG tablet Take 1 tablet (15 mg total) by mouth daily. 90 tablet 1   montelukast  (SINGULAIR ) 10 MG tablet TAKE 1 TABLET BY MOUTH EVERYDAY AT BEDTIME 90 tablet 1   nitroGLYCERIN  (NITROSTAT ) 0.4 MG SL tablet Place 1 tablet (0.4 mg total) under the tongue every 5 (five) minutes as needed for chest pain. 25 tablet 3   rosuvastatin  (CRESTOR ) 20 MG tablet Take 1 tablet (20 mg total) by mouth daily. 90 tablet 3   sertraline  (ZOLOFT ) 100 MG tablet Take 1 tablet (100 mg total) by mouth at bedtime. 90 tablet 1   tirzepatide  (MOUNJARO ) 10 MG/0.5ML Pen Inject 10 mg into the skin once a week. 6 mL 1   tirzepatide  (MOUNJARO ) 7.5 MG/0.5ML Pen Inject 7.5 mg into the skin once a week. 6 mL 1  tobramycin-dexamethasone  (TOBRADEX) ophthalmic solution INSTILL 1 DROP INTO BOTH EYES 4 TIMES A DAY FOR 7 DAYS. SHAKE WELL     triamcinolone  cream (KENALOG ) 0.1 % SMARTSIG:sparingly Topical Twice Daily PRN     Vitamin D , Ergocalciferol , (DRISDOL ) 1.25 MG (50000 UNIT) CAPS capsule Take 1 capsule (50,000 Units total) by mouth every 7 (seven) days. 12 capsule 4   No current facility-administered medications on file prior to visit.    SH:  Smoking No    PHYSICAL EXAMINATION:    BP 126/66 (BP Location: Left Arm, Patient Position: Sitting, Cuff Size: Large)   Pulse 74   Ht 5' 4 (1.626 m)   Wt 197 lb 6.4 oz (89.5 kg)   SpO2 99%   BMI 33.88 kg/m     General appearance: alert, cooperative and appears stated age CV:  Regular rate and rhythm Lungs:  clear to  auscultation, no wheezes, rales or rhonchi, symmetric air entry Abdomen: soft, non-tender; bowel sounds normal; no masses,  no organomegaly Incisions:  C/D/I  Assessment/Plan: 1. Cystadenoma of left ovary (Primary) - surgery, images and pathology reviewed.  Doing well.  Has follow up scheduled in March and she will keep this appointment.  2. Blood in stool - number given for Dr. London office.  She will call and schedule appointment.

## 2023-11-04 NOTE — Telephone Encounter (Signed)
 Spoke to patient to advise of Dr London response and recommendations. She verbalizes understanding and will come for labs/stool studies.

## 2023-11-04 NOTE — Telephone Encounter (Signed)
 Inbound call from patient stating she started to have blood in her stool the last couple of days along with abdominal cramping. Patient does not wish to wait until November. Requesting a call back. Please advise, thank you

## 2023-11-05 ENCOUNTER — Other Ambulatory Visit

## 2023-11-05 DIAGNOSIS — R197 Diarrhea, unspecified: Secondary | ICD-10-CM

## 2023-11-05 LAB — COMPREHENSIVE METABOLIC PANEL WITH GFR
ALT: 11 U/L (ref 0–35)
AST: 16 U/L (ref 0–37)
Albumin: 4.1 g/dL (ref 3.5–5.2)
Alkaline Phosphatase: 56 U/L (ref 39–117)
BUN: 20 mg/dL (ref 6–23)
CO2: 26 meq/L (ref 19–32)
Calcium: 9.6 mg/dL (ref 8.4–10.5)
Chloride: 101 meq/L (ref 96–112)
Creatinine, Ser: 0.94 mg/dL (ref 0.40–1.20)
GFR: 61.63 mL/min (ref >60.00)
Glucose, Bld: 108 mg/dL — ABNORMAL HIGH (ref 70–99)
Potassium: 4.3 meq/L (ref 3.5–5.1)
Sodium: 138 meq/L (ref 135–145)
Total Bilirubin: 0.4 mg/dL (ref 0.2–1.2)
Total Protein: 7.8 g/dL (ref 6.0–8.3)

## 2023-11-05 LAB — CBC WITH DIFFERENTIAL/PLATELET
Basophils Absolute: 0 K/uL (ref 0.0–0.1)
Basophils Relative: 1 % (ref 0.0–3.0)
Eosinophils Absolute: 0.1 K/uL (ref 0.0–0.7)
Eosinophils Relative: 3.8 % (ref 0.0–5.0)
HCT: 35.1 % — ABNORMAL LOW (ref 36.0–46.0)
Hemoglobin: 11.3 g/dL — ABNORMAL LOW (ref 12.0–15.0)
Lymphocytes Relative: 49.2 % — ABNORMAL HIGH (ref 12.0–46.0)
Lymphs Abs: 1.4 K/uL (ref 0.7–4.0)
MCHC: 32.3 g/dL (ref 30.0–36.0)
MCV: 79.7 fl (ref 78.0–100.0)
Monocytes Absolute: 0.5 K/uL (ref 0.1–1.0)
Monocytes Relative: 16.1 % — ABNORMAL HIGH (ref 3.0–12.0)
Neutro Abs: 0.9 K/uL — ABNORMAL LOW (ref 1.4–7.7)
Neutrophils Relative %: 29.9 % — ABNORMAL LOW (ref 43.0–77.0)
Platelets: 365 K/uL (ref 150.0–400.0)
RBC: 4.4 Mil/uL (ref 3.87–5.11)
RDW: 16.9 % — ABNORMAL HIGH (ref 11.5–15.5)
WBC: 2.9 K/uL — ABNORMAL LOW (ref 4.0–10.5)

## 2023-11-05 LAB — C-REACTIVE PROTEIN: CRP: 3.6 mg/dL (ref 0.5–20.0)

## 2023-11-06 ENCOUNTER — Other Ambulatory Visit

## 2023-11-06 DIAGNOSIS — R197 Diarrhea, unspecified: Secondary | ICD-10-CM

## 2023-11-07 ENCOUNTER — Ambulatory Visit: Payer: Self-pay | Admitting: Gastroenterology

## 2023-11-07 DIAGNOSIS — D729 Disorder of white blood cells, unspecified: Secondary | ICD-10-CM

## 2023-11-07 LAB — FECAL LACTOFERRIN, QUANT
Fecal Lactoferrin: NEGATIVE
MICRO NUMBER:: 17073397
SPECIMEN QUALITY:: ADEQUATE

## 2023-11-07 LAB — CLOSTRIDIUM DIFFICILE TOXIN B, QUALITATIVE, REAL-TIME PCR: Toxigenic C. Difficile by PCR: NOT DETECTED

## 2023-11-07 NOTE — Progress Notes (Signed)
 Kristie Owens,  Your labs looked good.   Your liver enzymes, electrolytes and kidney function were all great. Your white blood cell count (WBC) was a little low.  This may not mean anything, but if it is persistently low, you may need further testing/evaluation.  Let's plan to repeat a CBC in 1 month to see if it is stable or improving.

## 2023-11-08 LAB — GI PROFILE, STOOL, PCR

## 2023-11-08 NOTE — Progress Notes (Signed)
 Ms. Hughett,  Your stool tests were negative for C diff infection and fecal lactoferrin (a indicator of inflammation or infection of the GI tract). This is reassuring.  Are your symptoms getting any better?

## 2023-11-20 ENCOUNTER — Encounter: Payer: Self-pay | Admitting: Family Medicine

## 2023-11-20 ENCOUNTER — Ambulatory Visit: Admitting: Family Medicine

## 2023-11-20 VITALS — BP 126/70 | HR 73 | Temp 98.5°F | Wt 196.0 lb

## 2023-11-20 DIAGNOSIS — S46011A Strain of muscle(s) and tendon(s) of the rotator cuff of right shoulder, initial encounter: Secondary | ICD-10-CM | POA: Diagnosis not present

## 2023-11-20 MED ORDER — PREDNISONE 20 MG PO TABS: ORAL_TABLET | ORAL | 0 refills | Status: DC

## 2023-11-20 MED ORDER — DICYCLOMINE HCL 10 MG PO CAPS: 10.0000 mg | ORAL_CAPSULE | Freq: Three times a day (TID) | ORAL | 1 refills | Status: DC

## 2023-11-20 MED ORDER — TIZANIDINE HCL 4 MG PO TABS: 2.0000 mg | ORAL_TABLET | Freq: Three times a day (TID) | ORAL | 1 refills | Status: DC | PRN

## 2023-11-20 MED ORDER — HYDROXYZINE HCL 10 MG PO TABS: 10.0000 mg | ORAL_TABLET | Freq: Three times a day (TID) | ORAL | 0 refills | Status: DC | PRN

## 2023-11-20 NOTE — Patient Instructions (Addendum)
 Return in about 4 weeks (around 12/18/2023) for shoulder pain.        Great to see you today.  I have refilled the medication(s) we provide.   If labs were collected or images ordered, we will inform you of  results once we have received them and reviewed. We will contact you either by echart message, or telephone call.  Please give ample time to the testing facility, and our office to run,  receive and review results. Please do not call inquiring of results, even if you can see them in your chart. We will contact you as soon as we are able. If it has been over 1 week since the test was completed, and you have not yet heard from us , then please call us .    - echart message- for normal results that have been seen by the patient already.   - telephone call: abnormal results or if patient has not viewed results in their echart.  If a referral to a specialist was entered for you, please call us  in 2 weeks if you have not heard from the specialist office to schedule.

## 2023-11-20 NOTE — Progress Notes (Signed)
 Kristie Owens , 1956/01/28, 68 y.o., female MRN: [id number] Patient Care Team    Relationship Specialty Notifications Start End  Catherine Charlies LABOR, DO PCP - General Family Medicine  06/09/19   Jeffrie Oneil BROCKS, MD PCP - Cardiology Cardiology  12/17/19   Jeffrie Oneil BROCKS, MD Consulting Physician Cardiology  06/08/19   Elspeth Lauraine DEL, OD Referring Physician   06/11/19   Jalene Francis DEL Physician Assistant Chiropractic Medicine  06/11/19   Elspeth Lauraine DEL, OD Referring Physician   03/13/21     Chief Complaint  Patient presents with   Shoulder Pain    1 week; R shoulder. Pt has tried Advil.      Subjective: Kristie Owens is a 68 y.o. Pt presents for an OV with complaints of right shoulder pain  of 1 week duration.  Associated symptoms include discomfort with attempts to raise arm.  She also reports she cannot sleep on her right side now.  She reports she was remodeling her sewing room and painting onset of symptoms.  She does not recall any particular event in which she would have had a injury.  She denies any prior history of right shoulder injury or surgery. Pt has tried advil to ease their symptoms.  She points to the deltoid muscle area as the location of pain.  She states with movement the pain can increase.  Rather significant pain.  Without movement it feels like a toothache.  She denies any numbness or tingling.     10/14/2023    8:20 AM 07/01/2023    8:45 AM 04/11/2023    9:07 AM 03/18/2023    8:42 AM 08/08/2022   11:23 AM  Depression screen PHQ 2/9  Decreased Interest 0 0 0 0 0  Down, Depressed, Hopeless 0 0 0 0 0  PHQ - 2 Score 0 0 0 0 0  Altered sleeping 3 2  3    Tired, decreased energy 1 1  1    Change in appetite 0 1  0   Feeling bad or failure about yourself  0 0  0   Trouble concentrating 0 0  0   Moving slowly or fidgety/restless 0 0  0   Suicidal thoughts 0 0  0   PHQ-9 Score 4 4  4    Difficult doing work/chores Somewhat difficult Not difficult at all  Somewhat  difficult     Allergies  Allergen Reactions   Bupropion Nausea Only and Other (See Comments)    Nausea and headaches    Sulfa Antibiotics Other (See Comments)    From childhood- reaction not recalled   Prednisone  Anxiety and Other (See Comments)    Made me not feel like myself, altered   Social History   Social History Narrative   Marital status/children/pets: Married.  One child.   Education/employment: Retired.  Associates degree in college.   Safety:      -Wears a bicycle helmet riding a bike: Yes     -smoke alarm in the home:Yes     - wears seatbelt: Yes     - Feels safe in their relationships: Yes   Past Medical History:  Diagnosis Date   Achilles bursitis or tendinitis 11/11/2012   right    Allergy    Angina at rest    Dr Jeffrie   CAD (coronary artery disease), native coronary artery    DES to LAD in 2 areas, one at the bifurcation of a diagonal branch and  also in the mid to distal region. Her diagonal branch was also ballooned . She has a DES to RCA 10/10. Her circumflex artery mild ostial disease.   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   Tremor 03/23/2020   Past Surgical History:  Procedure Laterality Date   BREAST EXCISIONAL BIOPSY Right 1995   and in 1996   CAROTID STENT  2010   3 done wtithin a few weeks   CESAREAN SECTION  1983   FOOT SURGERY     LAPAROSCOPIC SALPINGO OOPHERECTOMY N/A 10/08/2023   Procedure:  BILATERAL SALPINGO-OOPHORECTOMY, LAPAROSCOPIC , WASHINGS, EXCISION OF LEFT SIDE WALL CYST;  Surgeon: Cleotilde Ronal RAMAN, MD;  Location: [facility] OR;  Service: Gynecology;  Laterality: N/A;   LEFT HEART CATH AND CORONARY ANGIOGRAPHY N/A 06/04/2016   Procedure: Left Heart Cath and Coronary Angiography;  Surgeon: Claudene Victory ORN, MD;  Location: [facility] INVASIVE CV LAB;  Service: Cardiovascular;  Laterality: N/A;   LEFT HEART CATH AND CORONARY ANGIOGRAPHY N/A 12/04/2019   Procedure: LEFT HEART CATH AND CORONARY ANGIOGRAPHY;  Surgeon: Verlin Lonni BIRCH, MD;  Location: [facility] INVASIVE CV LAB;  Service: Cardiovascular;  Laterality: N/A;   LEFT HEART CATHETERIZATION WITH CORONARY ANGIOGRAM N/A 01/16/2011   Procedure: LEFT HEART CATHETERIZATION WITH CORONARY ANGIOGRAM;  Surgeon: Oneil Parchment, MD;  Location: [facility] CATH LAB;  Service: Cardiovascular;  Laterality: N/A;   NASAL SEPTUM SURGERY  2000   SINOSCOPY     Family History  Problem Relation Age of Onset   Hypertension Mother    Heart disease Mother    COPD Mother    Ulcers Mother    Lung cancer Father    Hypertension Sister    Diabetes Sister    Arthritis Sister    Depression Sister    Miscarriages / [place] Sister    Early death Maternal Grandmother    Tuberculosis Maternal Grandmother    Lung cancer Maternal Grandfather    Suicidality Maternal Grandfather    Early death Maternal Grandfather    Heart failure Paternal Grandmother    Heart attack Paternal Grandmother    Hypertension Paternal Grandmother    Hyperlipidemia Paternal Grandmother    Heart disease Paternal Grandmother    Heart disease Paternal Grandfather    Hyperlipidemia Paternal Grandfather    Hypertension Paternal Grandfather    Hyperlipidemia Brother    Hypertension Brother    Intellectual disability Brother    Kidney disease Brother    Heart attack Brother    Asthma Brother    Early death Brother 87   Hyperlipidemia Brother    Hypertension Brother    Intellectual disability  Brother    Kidney disease Brother    Heart disease Brother    Alcohol abuse Brother    Heart attack Other    Stroke Neg Hx    Breast cancer Neg  Hx    Allergies as of 11/20/2023       Reactions   Bupropion Nausea Only, Other (See Comments)   Nausea and headaches    Sulfa Antibiotics Other (See Comments)   From childhood- reaction not recalled   Prednisone  Anxiety, Other (See Comments)   Made me not feel like myself, altered        Medication List        Accurate as of November 20, 2023  1:00 PM. If you have any questions, ask your nurse or doctor.          amLODipine  5 MG tablet Commonly known as: NORVASC  Take 1 tablet (5 mg total) by mouth daily.   aspirin  EC 81 MG tablet Take 81 mg by mouth daily.   atropine  1 % ophthalmic solution SMARTSIG:In Eye(s)   budesonide  0.5 MG/2ML nebulizer solution Commonly known as: Pulmicort  Use twice a day in a sinus rinse bottle.   carvedilol  6.25 MG tablet Commonly known as: COREG  Take 1 tablet (6.25 mg total) by mouth 2 (two) times daily with a meal.   cetirizine  10 MG tablet Commonly known as: ZyrTEC  Allergy Take 1 tablet (10 mg total) by mouth daily.   clindamycin 1 % external solution Commonly known as: CLEOCIN T SMARTSIG:15 Milliliter(s) Topical Twice Daily PRN   cromolyn  4 % ophthalmic solution Commonly known as: OPTICROM  Place 1 drop into both eyes 4 (four) times daily as needed (itchy/watery eyes).   dicyclomine  10 MG capsule Commonly known as: BENTYL  Take 1 capsule (10 mg total) by mouth 4 (four) times daily -  before meals and at bedtime. What changed: See the new instructions. Changed by: Charlies Bellini   estradiol  0.1 MG/GM vaginal cream Commonly known as: ESTRACE  Apply a pea-sized amount to finger tip and rub in vaginal and urethral area 3x weekly   hydrOXYzine  10 MG tablet Commonly known as: ATARAX  Take 1 tablet (10 mg total) by mouth 3 (three) times daily as needed. Started by: Paityn Balsam    isosorbide  mononitrate 60 MG 24 hr tablet Commonly known as: IMDUR  TAKE 1 TABLET BY MOUTH EVERY DAY   lisinopril -hydrochlorothiazide  20-12.5 MG tablet Commonly known as: ZESTORETIC  Take 1 tablet by mouth daily.   meloxicam  15 MG tablet Commonly known as: MOBIC  Take 1 tablet (15 mg total) by mouth daily.   montelukast  10 MG tablet Commonly known as: SINGULAIR  TAKE 1 TABLET BY MOUTH EVERYDAY AT BEDTIME   nitroGLYCERIN  0.4 MG SL tablet Commonly known as: NITROSTAT  Place 1 tablet (0.4 mg total) under the tongue every 5 (five) minutes as needed for chest pain.   predniSONE  20 MG tablet Commonly known as: DELTASONE  60 mg x3d, 40 mg x3d, 20 mg x2d, 10 mg x2d Started by: Charlies Bellini   rosuvastatin  20 MG tablet Commonly known as: CRESTOR  Take 1 tablet (20 mg total) by mouth daily.   sertraline  100 MG tablet Commonly known as: ZOLOFT  Take 1 tablet (100 mg total) by mouth at bedtime.   tirzepatide  10 MG/0.5ML Pen Commonly known as: MOUNJARO  Inject 10 mg into the skin once a week. What changed: Another medication with the same name was removed. Continue taking this medication, and follow the directions you see here. Changed by: Charlies Bellini   tiZANidine 4 MG tablet Commonly known as: Zanaflex Take 0.5-1 tablets (2-4 mg total) by mouth every 8 (eight) hours as needed for muscle spasms. Started by: Cassity Christian   tobramycin-dexamethasone  ophthalmic solution Commonly known as: TOBRADEX INSTILL 1 DROP INTO  BOTH EYES 4 TIMES A DAY FOR 7 DAYS. SHAKE WELL   triamcinolone  cream 0.1 % Commonly known as: KENALOG  SMARTSIG:sparingly Topical Twice Daily PRN   Vitamin D  (Ergocalciferol ) 1.25 MG (50000 UNIT) Caps capsule Commonly known as: DRISDOL  Take 1 capsule (50,000 Units total) by mouth every 7 (seven) days.        All past medical history, surgical history, allergies, family history, immunizations andmedications were updated in the EMR today and reviewed under the history  and medication portions of their EMR.     ROS Negative, with the exception of above mentioned in HPI   Objective:  BP 126/70   Pulse 73   Temp 98.5 F (36.9 C)   Wt 196 lb (88.9 kg)   SpO2 96%   BMI 33.64 kg/m  Body mass index is 33.64 kg/m. Physical Exam Vitals and nursing note reviewed.  Constitutional:      General: She is not in acute distress.    Appearance: Normal appearance. She is normal weight. She is not ill-appearing or toxic-appearing.  HENT:     Head: Normocephalic and atraumatic.  Eyes:     General: No scleral icterus.       Right eye: No discharge.        Left eye: No discharge.     Extraocular Movements: Extraocular movements intact.     Conjunctiva/sclera: Conjunctivae normal.     Pupils: Pupils are equal, round, and reactive to light.  Musculoskeletal:     Comments: Right upper extremity: Full range of motion with pain onset at 80 degrees abduction.  Positive empty can test.  Positive Hawkins.  Negative O'Brien's.  Neurovascular intact distally.  Skin:    Findings: No rash.  Neurological:     Mental Status: She is alert and oriented to person, place, and time. Mental status is at baseline.     Motor: No weakness.     Coordination: Coordination normal.     Gait: Gait normal.  Psychiatric:        Mood and Affect: Mood normal.        Behavior: Behavior normal.        Thought Content: Thought content normal.        Judgment: Judgment normal.      No results found. No results found. No results found for this or any previous visit (from the past 24 hours).  Assessment/Plan: Kristie Owens is a 68 y.o. female present for OV for  Rotator cuff strain Exam consistent with rotator cuff strain versus tendinitis.  Cannot rule out partial tear.  She does not recall a injury but was over using with painting. Prednisone  taper-with low-dose Vistaril  as needed to offset side effects as needed Zanaflex 3 times daily as needed Follow-up in 4 weeks, may need  to consider physical therapy versus sports med referral if symptoms still present   Reviewed expectations re: course of current medical issues. Discussed self-management of symptoms. Outlined signs and symptoms indicating need for more acute intervention. Patient verbalized understanding and all questions were answered. Patient received an After-Visit Summary.    No orders of the defined types were placed in this encounter.  Meds ordered this encounter  Medications   predniSONE  (DELTASONE ) 20 MG tablet    Sig: 60 mg x3d, 40 mg x3d, 20 mg x2d, 10 mg x2d    Dispense:  18 tablet    Refill:  0   tiZANidine (ZANAFLEX) 4 MG tablet    Sig: Take 0.5-1 tablets (2-4  mg total) by mouth every 8 (eight) hours as needed for muscle spasms.    Dispense:  90 tablet    Refill:  1   hydrOXYzine  (ATARAX ) 10 MG tablet    Sig: Take 1 tablet (10 mg total) by mouth 3 (three) times daily as needed.    Dispense:  30 tablet    Refill:  0   dicyclomine  (BENTYL ) 10 MG capsule    Sig: Take 1 capsule (10 mg total) by mouth 4 (four) times daily -  before meals and at bedtime.    Dispense:  90 capsule    Refill:  1   Referral Orders  No referral(s) requested today     Note is dictated utilizing voice recognition software. Although note has been proof read prior to signing, occasional typographical errors still can be missed. If any questions arise, please do not hesitate to call for verification.   electronically signed by:  Charlies Bellini, DO  [facility] [place]

## 2023-12-12 ENCOUNTER — Ambulatory Visit: Admitting: Nurse Practitioner

## 2023-12-12 ENCOUNTER — Other Ambulatory Visit: Payer: Self-pay | Admitting: Family Medicine

## 2023-12-12 ENCOUNTER — Other Ambulatory Visit

## 2023-12-12 ENCOUNTER — Encounter: Payer: Self-pay | Admitting: Nurse Practitioner

## 2023-12-12 VITALS — BP 106/64 | HR 75 | Ht 64.0 in | Wt 196.1 lb

## 2023-12-12 DIAGNOSIS — D649 Anemia, unspecified: Secondary | ICD-10-CM

## 2023-12-12 DIAGNOSIS — R197 Diarrhea, unspecified: Secondary | ICD-10-CM

## 2023-12-12 LAB — CBC
HCT: 36.6 % (ref 36.0–46.0)
Hemoglobin: 12.4 g/dL (ref 12.0–15.0)
MCHC: 33.9 g/dL (ref 30.0–36.0)
MCV: 79.7 fl (ref 78.0–100.0)
Platelets: 426 K/uL — ABNORMAL HIGH (ref 150.0–400.0)
RBC: 4.59 Mil/uL (ref 3.87–5.11)
RDW: 16.3 % — ABNORMAL HIGH (ref 11.5–15.5)
WBC: 3.8 K/uL — ABNORMAL LOW (ref 4.0–10.5)

## 2023-12-12 MED ORDER — IBGARD 90 MG PO CPCR: ORAL_CAPSULE | ORAL | 0 refills | Status: DC

## 2023-12-12 NOTE — Progress Notes (Signed)
 12/12/2023 Kristie Owens [id number] 03/25/1955   Chief Complaint: Alternating diarrhea/constipation  History of Present Illness: Kristie Owens is a 68 year old female with a past medical history of depression, hypertension, hyperlipidemia, coronary artery disease s/p DES to the LAD and RCA 2010, DM type II, OSA uses CPAP, hepatic steatosis, IBS and colon polyps.   She is known by Dr. Stacia. Last seen in office by [facility] 08/16/2022 for further evaluation for LLQ pain and diarrhea. Possible pancreatic insufficiency suspected.  CTAP with contrast 08/15/2022 showed an abnormally thickened appearance of distal appendix up to 1 cm in diameter (mild acute appendicitis not excluded, enlarging retroperitoneal cyst along the left adnexa and stable borderline prominence of dorsal pancreatic duct along portions of the pancreatic head. Pancreatic elastase level > 500. Urine 5HIAA 3.8.  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  She contacted our office 11/04/2023 due to having explosive diarrhea on Friday, 11/01/2023.  She recalled eating a good meal at a restaurant but could not recall what she ate but noted diarrhea started 2 to 3 hours later. She endorsed passing 10 episodes of bloody diarrhea x 2 days with associated lower abdominal cramping.  She then developed hard stools alternating with nonbloody diarrhea and sometimes skipped a few days without passing a BM.  Labs 10/7 showed a WBC count of 2.9 and hemoglobin 11.3.  GI pathogen panel and C. difficile PCR were negative 11/06/2023.  She was prescribed Dicyclomine  which reduced her abdominal cramps.  She noted having similar nonbloody diarrhea for 4 days when she was in [place] 3 years ago.   She underwent a colonoscopy 09/05/2022 which identified 1 sessile serrated polyp and 1 tubular adenomatous polyp removed from the transverse colon, repeat colonoscopy in 7 years was recommended.  She noted having mild RUQ discomfort for 4 to 5 days last week without nausea and vomiting.      Latest Ref Rng & Units 11/05/2023   10:44 AM 10/08/2023   10:30 AM 10/02/2023    2:40 PM  CBC  WBC 4.0 - 10.5 K/uL 2.9  3.9  4.2   Hemoglobin 12.0 - 15.0 g/dL 88.6  88.1  88.0   Hematocrit 36.0 - 46.0 % 35.1  37.3  37.5   Platelets 150.0 - 400.0 K/uL 365.0  343  408   MCV  79.7     Latest Ref Rng & Units 11/05/2023   10:44 AM 10/08/2023   10:30 AM 10/02/2023    2:40 PM  CMP  Glucose 70 - 99 mg/dL 891  894  894   BUN 6 - 23 mg/dL 20  24  27    Creatinine 0.40 - 1.20 mg/dL 9.05  9.05  8.98   Sodium 135 - 145 mEq/L 138  137  137   Potassium 3.5 - 5.1 mEq/L 4.3  4.0  4.3   Chloride 96 - 112 mEq/L 101  107  100   CO2 19 - 32 mEq/L 26  20  29    Calcium  8.4 - 10.5 mg/dL 9.6  9.1  89.9   Total Protein 6.0 - 8.3 g/dL 7.8   7.5   Total Bilirubin 0.2 - 1.2 mg/dL 0.4   0.3   Alkaline Phos 39 - 117 U/L 56     AST 0 -  37 U/L 16   18   ALT 0 - 35 U/L 11   13    CRP 3.6.   Abdominal MRI with and without contrast 11/20/2022: FINDINGS: Lower chest: No acute abnormality.   Hepatobiliary: No solid liver abnormality is seen. Mild hepatic steatosis. No gallstones, gallbladder wall thickening, or biliary dilatation.   Pancreas: Unremarkable. No pancreatic ductal dilatation or surrounding inflammatory changes.   Spleen: Normal in size without significant abnormality.   Adrenals/Urinary Tract: Adrenal glands are unremarkable. Numerous simple bilateral renal cortical and parapelvic cysts, as well as an intrinsically T1 hyperintense, T2 hypointense hemorrhagic or proteinaceous cyst of the posterior midportion of the left kidney and a T2 intermediate hemorrhagic or proteinaceous cyst of the inferior pole of the  right kidney, both without associated contrast enhancement (series 11, image 59, series 4, image 43). No solid mass or suspicious contrast enhancement. No obvious calculi. 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   Stomach/Bowel: Stomach is within normal limits. No evidence of bowel wall thickening, distention, or inflammatory changes.   Vascular/Lymphatic: Aortic atherosclerosis. No enlarged abdominal lymph nodes.   Other: No abdominal wall hernia or abnormality. No ascites.   Musculoskeletal: No acute or significant osseous findings.   IMPRESSION: 1. Numerous simple bilateral renal cortical and parapelvic cysts, as well as hemorrhagic or proteinaceous cysts of the posterior midportion of the left kidney and inferior pole of the right kidney. No solid mass or suspicious contrast enhancement. These are benign Bosniak category I and II cysts for which no further follow-up or characterization is required. 2. 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 3. Mild hepatic steatosis. 4. No acute findings in the abdomen.  Aortic Atherosclerosis   CTAP with contrast 08/15/2022: FINDINGS: Lower chest: Descending thoracic aortic, left anterior descending, and right coronary artery atherosclerotic vascular calcification.   Hepatobiliary: Unremarkable   Pancreas: Stable borderline prominence of dorsal pancreatic duct along portions the pancreatic head. Otherwise unremarkable.   Spleen: Unremarkable   Adrenals/Urinary Tract: The adrenal glands appear normal.   Hyperdense 1.5 by 1.2 cm exophytic lesion of the left mid kidney laterally with internal density of 117 Hounsfield units on portal venous phase images and similar density on delayed phase images. Questionable  simple appearing cyst in this vicinity on 07/02/2018. This could well be a complex cyst, an enhancing mass is not completely excluded despite the lack of change of density between portal venous and delayed phase images. This lesion warrants further workup.   Complex lesion of the right kidney lower pole anteriorly measuring 1.3 by 1.1 cm on image 53 series 2, internal density 58 Hounsfield units, nonspecific for mass versus complex cyst.   Partially duplicated right renal collecting system with dilated upper pole moiety and transition at the upper pole moiety UPJ. Similar appearance on the prior exam.   Urinary bladder unremarkable.   Stomach/Bowel: Abnormally thickened appearance of the distal appendix up to 1 cm in diameter for example on image 88 of series 8, thickened appearance also visible on images 75 through 82 of series 6. This represents a new finding compared to 07/02/2018 and mild acute appendicitis is not excluded.   Vascular/Lymphatic: Atherosclerosis is present, including aortoiliac atherosclerotic disease. Mild atheromatous plaque at the origins of the celiac trunk and SMA.   Reproductive: Enlarging but simple appearing left retroperitoneal cyst along  the upper margin of the left adnexa but not abutting the ovary, currently this unilocular cystic lesion measures 7.3 by 5.6 by 6.7 cm (volume = 140 cm^3), formerly 4.1 by 3.1 by 3.5 cm (volume = 23 cm^3).   Other: No supplemental non-categorized findings.   Musculoskeletal: Bilateral acetabular spurring. Mild lower lumbar spondylosis and degenerative disc disease.   IMPRESSION: 1. Abnormally thickened appearance of the distal appendix up to 1 cm in diameter, new compared to 07/02/2018 and mild acute appendicitis is not excluded. 2. Enlarging but simple appearing left retroperitoneal cyst along the upper margin of the left adnexa but not abutting the ovary, currently measuring up to 140 cc, formerly 23 cc.  Recommend follow-up US  in 6-12 months. Note: This recommendation does not apply to premenarchal patients and to those with increased risk (genetic, family history, elevated tumor markers or other high-risk factors) of ovarian cancer. Reference: JACR 2020 Feb; 17(2):248-254 3. Hyperdense 1.5 by 1.2 cm exophytic lesion of the left mid kidney laterally with internal density of 117 Hounsfield units on portal venous phase images and similar density on delayed phase images. There is also a lesion of the right kidney lower pole measuring 58 Hounsfield units. Possibilities may include complex cysts or renal mass/malignancy. Renal protocol MRI with and without contrast is recommended for further investigation and characterization. 4. Partially duplicated right renal collecting system with chronically dilated upper pole moiety and transition at the upper pole moiety UPJ, with normal ureter. Similar appearance on the prior exam. 5. Aortic, coronary, and systemic atherosclerosis. Aortic Atherosclerosis    ECHO 12/07/2019: 1. Left ventricular ejection fraction, by estimation, is 65 to 70%. The left ventricle has normal function. The left ventricle has no regional wall motion abnormalities. There is mild left ventricular hypertrophy. Left ventricular diastolic parameters are indeterminate. 2. Right ventricular systolic function is normal. The right ventricular size is normal. Tricuspid regurgitation signal is inadequate for assessing PA pressure. 3. The mitral valve is normal in structure. No evidence of mitral valve regurgitation. 4. The aortic valve was not well visualized. Aortic valve regurgitation is not visualized. Mild aortic valve sclerosis is present, with no evidence of aortic valve stenosis.  GI PROCEDURES:  Colonoscopy 09/05/2022: - Perianal skin tags found on perianal exam.  - One 8 mm polyp in the cecum, removed with a cold snare. Resected and retrieved.  - One 4 mm polyp in the transverse  colon, removed piecemeal using a cold snare. Resected and retrieved.  - Normal mucosa in the entire examined colon. Biopsied.  - The examined portion of the ileum was normal.  - The distal rectum and anal verge are normal on retroflexion view.  - Recall colonoscopy 7 years 1. Surgical [P], colon, cecum, polyp (1) - SESSILE SERRATED POLYP. - NO DYSPLASIA OR MALIGNANCY. 2. Surgical [P], colon nos, random sites - BENIGN COLONIC MUCOSA WITH NO SPECIFIC PATHOLOGIC CHANGES - NEGATIVE FOR INCREASED INTRAEPITHELIAL LYMPHOCYTES OR THICKENED SUBEPITHELIAL COLLAGEN TABLE - NEGATIVE FOR DYSPLASIA OR MALIGNANCY 3. Surgical [P], colon, transverse, polyp (1) - TUBULAR ADENOMA. - NO HIGH GRADE DYSPLASIA OR MALIGNANCY  Current Outpatient Medications on File Prior to Visit  Medication Sig Dispense Refill   amLODipine  (NORVASC ) 5 MG tablet Take 1 tablet (5 mg total) by mouth daily. 90 tablet 1   aspirin  EC 81 MG tablet Take 81 mg by mouth daily.     budesonide  (PULMICORT ) 0.5 MG/2ML nebulizer solution Use twice a day in a sinus rinse bottle. 120 mL 2   carvedilol  (COREG )  6.25 MG tablet Take 1 tablet (6.25 mg total) by mouth 2 (two) times daily with a meal. 180 tablet 1   cetirizine  (ZYRTEC  ALLERGY) 10 MG tablet Take 1 tablet (10 mg total) by mouth daily. 30 tablet 5   clindamycin (CLEOCIN T) 1 % external solution SMARTSIG:15 Milliliter(s) Topical Twice Daily PRN     cromolyn  (OPTICROM ) 4 % ophthalmic solution Place 1 drop into both eyes 4 (four) times daily as needed (itchy/watery eyes). 10 mL 5   dicyclomine  (BENTYL ) 10 MG capsule Take 1 capsule (10 mg total) by mouth 4 (four) times daily -  before meals and at bedtime. 90 capsule 1   Difluprednate 0.05 % EMUL INSTILL 1 DROP INTO LEFT EYE 4 TIMES DAILY FOR 1 WEEK     estradiol  (ESTRACE ) 0.1 MG/GM vaginal cream Apply a pea-sized amount to finger tip and rub in vaginal and urethral area 3x weekly 42.5 g 3   hydrOXYzine  (ATARAX ) 10 MG tablet Take 1 tablet (10  mg total) by mouth 3 (three) times daily as needed. 30 tablet 0   isosorbide  mononitrate (IMDUR ) 60 MG 24 hr tablet TAKE 1 TABLET BY MOUTH EVERY DAY 90 tablet 2   lisinopril -hydrochlorothiazide  (ZESTORETIC ) 20-12.5 MG tablet Take 1 tablet by mouth daily. 90 tablet 1   meloxicam  (MOBIC ) 15 MG tablet Take 1 tablet (15 mg total) by mouth daily. 90 tablet 1   montelukast  (SINGULAIR ) 10 MG tablet TAKE 1 TABLET BY MOUTH EVERYDAY AT BEDTIME 90 tablet 1   nitroGLYCERIN  (NITROSTAT ) 0.4 MG SL tablet Place 1 tablet (0.4 mg total) under the tongue every 5 (five) minutes as needed for chest pain. 25 tablet 3   rosuvastatin  (CRESTOR ) 20 MG tablet Take 1 tablet (20 mg total) by mouth daily. 90 tablet 3   sertraline  (ZOLOFT ) 100 MG tablet Take 1 tablet (100 mg total) by mouth at bedtime. 90 tablet 1   tirzepatide  (MOUNJARO ) 10 MG/0.5ML Pen Inject 10 mg into the skin once a week. 6 mL 1   tiZANidine (ZANAFLEX) 4 MG tablet Take 0.5-1 tablets (2-4 mg total) by mouth every 8 (eight) hours as needed for muscle spasms. 90 tablet 1   tobramycin-dexamethasone  (TOBRADEX) ophthalmic solution INSTILL 1 DROP INTO BOTH EYES 4 TIMES A DAY FOR 7 DAYS. SHAKE WELL     triamcinolone  cream (KENALOG ) 0.1 % SMARTSIG:sparingly Topical Twice Daily PRN     Vitamin D , Ergocalciferol , (DRISDOL ) 1.25 MG (50000 UNIT) CAPS capsule Take 1 capsule (50,000 Units total) by mouth every 7 (seven) days. 12 capsule 4   No current facility-administered medications on file prior to visit.   Allergies  Allergen Reactions   Bupropion Nausea Only and Other (See Comments)    Nausea and headaches    Sulfa Antibiotics Other (See Comments)    From childhood- reaction not recalled   Prednisone  Anxiety and Other (See Comments)    Made me not feel like myself, altered   Current Medications, Allergies, Past Medical History, Past Surgical History, Family History and Social History were reviewed in [employer] record.  Review of  Systems:   Constitutional: Negative for fever, sweats, chills or weight loss.  Respiratory: Negative for shortness of breath.   Cardiovascular: Negative for chest pain, palpitations and leg swelling.  Gastrointestinal: See HPI.  Musculoskeletal: Negative for back pain or muscle aches.  Neurological: Negative for dizziness, headaches or paresthesias.   Physical Exam: BP 106/64   Pulse 75   Ht 5' 4 (1.626 m)   Wt  196 lb 2 oz (89 kg)   SpO2 96%   BMI 33.66 kg/m   Wt Readings from Last 3 Encounters:  12/12/23 196 lb 2 oz (89 kg)  11/20/23 196 lb (88.9 kg)  11/04/23 197 lb 6.4 oz (89.5 kg)    General: 68 year old female in no acute distress. Head: Normocephalic and atraumatic. Eyes: No scleral icterus. Conjunctiva pink . Ears: Normal auditory acuity. Mouth: Dentition intact. No ulcers or lesions.  Lungs: Clear throughout to auscultation. Heart: Regular rate and rhythm, no murmur. Abdomen: Soft, nontender and nondistended. No masses or hepatomegaly. Normal bowel sounds x 4 quadrants.  Rectal: Deferred. Musculoskeletal: Symmetrical with no gross deformities. Extremities: No edema. Neurological: Alert oriented x 4. No focal deficits.  Psychological: Alert and cooperative. Normal mood and affect  Assessment and Recommendations:  68 year old female with acute onset bloody diarrhea with lower abdominal cramping 11/01/2023 which lasted x 2 days, subsequently resulting in alternating diarrhea and constipation.  GI pathogen panel and C. difficile testing negative.  Suspect acute infectious colitis with subsequent postinfectious IBS.  Ischemic colitis in the differential as well. - Diet as tolerated, push fluids - Florastor probiotic 1 p.o. twice daily x 2 weeks - Avoid dairy products for 2 weeks hased OTC - IBgard 1 capsule p.o. twice daily  - Patient to contact our office if diarrhea/bloody diarrhea/abdominal pain recurs  Coronary artery disease s/p DES x 2 in 2010. On ASA.  No  angina.  Normocytic anemia, mild - CBC  Leukopenia, mild - CBC  RUQ pain x 4 to 5 days, abated. No N/V.  Normal LFTs 11/05/2023. - Patient to contact office if RUQ pain recurs  History of colon polyps.  Colonoscopy 08/2022 identified 1 tubular adenomatous and 1 sessile serrated polyp removed from the colon. - Next colon polyp surveillance colonoscopy due 08/2029  DM type II

## 2023-12-12 NOTE — Patient Instructions (Addendum)
 Florastor probiotic once capsule by mouth twice daily x 2 weeks, purchased over the counter.   Avoid dairy products   IbGard one capsule by mouth twice daily for abdominal gas/bloat and cramping  (Samples given today).  Contact our office if abdominal pain or bloody diarrhea recurs   Please go to the lab in the basement of our building to have lab work done as you leave today. Hit B for basement when you get on the elevator.  When the doors open the lab is on your left.  We will call you with the results. Thank you.   Thank you for entrusting me with your care and for choosing [facility], [place], CRNP   _______________________________________________________  If your blood pressure at your visit was 140/90 or greater, please contact your primary care physician to follow up on this.  _______________________________________________________  If you are age 38 or older, your body mass index should be between 23-30. Your Body mass index is 33.66 kg/m. If this is out of the aforementioned range listed, please consider follow up with your Primary Care Provider.  If you are age 68 or younger, your body mass index should be between 19-25. Your Body mass index is 33.66 kg/m. If this is out of the aformentioned range listed, please consider follow up with your Primary Care Provider.   ________________________________________________________  The [facility] providers would like to encourage you to use MYCHART to communicate with providers for non-urgent requests or questions.  Due to long hold times on the telephone, sending your provider a message by [facility] may be a faster and more efficient way to get a response.  Please allow 48 business hours for a response.  Please remember that this is for non-urgent requests.  _______________________________________________________  [facility] is using a team-based approach to care.  Your team is made up of your  doctor and two to three APPS. Our APPS (Nurse Practitioners and Physician Assistants) work with your physician to ensure care continuity for you. They are fully qualified to address your health concerns and develop a treatment plan. They communicate directly with your gastroenterologist to care for you. Seeing the Advanced Practice Practitioners on your physician's team can help you by facilitating care more promptly, often allowing for earlier appointments, access to diagnostic testing, procedures, and other specialty referrals.

## 2023-12-15 ENCOUNTER — Ambulatory Visit: Payer: Self-pay | Admitting: Nurse Practitioner

## 2023-12-18 ENCOUNTER — Ambulatory Visit: Admitting: Family Medicine

## 2023-12-19 ENCOUNTER — Encounter: Payer: Self-pay | Admitting: Family Medicine

## 2023-12-19 ENCOUNTER — Ambulatory Visit (INDEPENDENT_AMBULATORY_CARE_PROVIDER_SITE_OTHER): Admitting: Family Medicine

## 2023-12-19 VITALS — BP 120/60 | HR 73 | Temp 98.0°F | Wt 196.8 lb

## 2023-12-19 DIAGNOSIS — S46011A Strain of muscle(s) and tendon(s) of the rotator cuff of right shoulder, initial encounter: Secondary | ICD-10-CM

## 2023-12-19 DIAGNOSIS — S46001D Unspecified injury of muscle(s) and tendon(s) of the rotator cuff of right shoulder, subsequent encounter: Secondary | ICD-10-CM

## 2023-12-19 MED ORDER — MELOXICAM 15 MG PO TABS: 15.0000 mg | ORAL_TABLET | Freq: Every day | ORAL | 1 refills | Status: DC

## 2023-12-19 NOTE — Patient Instructions (Addendum)

## 2023-12-19 NOTE — Progress Notes (Signed)
 Kristie Owens , 13-Sep-1955, 68 y.o., female MRN: [id number] Patient Care Team    Relationship Specialty Notifications Start End  Catherine Charlies LABOR, DO PCP - General Family Medicine  06/09/19   Jeffrie Oneil BROCKS, MD PCP - Cardiology Cardiology  12/17/19   Jeffrie Oneil BROCKS, MD Consulting Physician Cardiology  06/08/19   Elspeth Lauraine DEL, OD Referring Physician   06/11/19   Jalene Francis DEL Physician Assistant Chiropractic Medicine  06/11/19   Elspeth Lauraine DEL, OD Referring Physician   03/13/21     Chief Complaint  Patient presents with   Shoulder Pain    Denies any injury; right shoulder Eye exam records requested     Subjective: Kristie Owens is a 68 y.o. Pt presents for an OV follow up on shoulder pain.  Patient was seen 4 weeks ago for right shoulder pain that has been present for 1 week.  Exam consistent with rotator strain or injury.  She was treated with muscle relaxant and steroid taper.  She reports she has noticed some improvement in her symptoms.  Her range of motion has improved.  There still is pain present daily.  Prior note 4 weeks: with complaints of right shoulder pain  of 1 week duration.  Associated symptoms include discomfort with attempts to raise arm.  She also reports she cannot sleep on her right side now.  She reports she was remodeling her sewing room and painting onset of symptoms.  She does not recall any particular event in which she would have had a injury.  She denies any prior history of right shoulder injury or surgery. Pt has tried advil to ease their symptoms.  She points to the deltoid muscle area as the location of pain.  She states with movement the pain can increase.  Rather significant pain.  Without movement it feels like a toothache.  She denies any numbness or tingling.     10/14/2023    8:20 AM 07/01/2023    8:45 AM 04/11/2023    9:07 AM 03/18/2023    8:42 AM 08/08/2022   11:23 AM  Depression screen PHQ 2/9  Decreased Interest 0 0 0 0 0  Down,  Depressed, Hopeless 0 0 0 0 0  PHQ - 2 Score 0 0 0 0 0  Altered sleeping 3 2  3    Tired, decreased energy 1 1  1    Change in appetite 0 1  0   Feeling bad or failure about yourself  0 0  0   Trouble concentrating 0 0  0   Moving slowly or fidgety/restless 0 0  0   Suicidal thoughts 0 0  0   PHQ-9 Score 4  4   4     Difficult doing work/chores Somewhat difficult Not difficult at all  Somewhat difficult      Data saved with a previous flowsheet row definition    Allergies  Allergen Reactions   Bupropion Nausea Only and Other (See Comments)    Nausea and headaches    Sulfa Antibiotics Other (See Comments)    From childhood- reaction not recalled   Prednisone  Anxiety and Other (See Comments)    Made me not feel like myself, altered   Social History   Social History Narrative   Marital status/children/pets: Married.  One child.   Education/employment: Retired.  Associates degree in college.   Safety:      -Wears a bicycle helmet riding a bike: Yes     -  smoke alarm in the home:Yes     - wears seatbelt: Yes     - Feels safe in their relationships: Yes   Past Medical History:  Diagnosis Date   Achilles bursitis or tendinitis 11/11/2012   right    Allergy    Angina at rest    Dr Jeffrie   CAD (coronary artery disease), native coronary artery    DES to LAD in 2 areas, one at the bifurcation of a diagonal branch and also in the mid to distal region. Her diagonal branch was also ballooned . She has a DES to RCA 10/10. Her circumflex artery mild ostial disease.   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   Tremor 03/23/2020   Past Surgical History:  Procedure Laterality Date   BREAST EXCISIONAL BIOPSY Right 1995   and in 1996   CAROTID STENT  2010   3 done wtithin a few weeks   CESAREAN SECTION  1983   FOOT SURGERY     LAPAROSCOPIC SALPINGO OOPHERECTOMY N/A 10/08/2023   Procedure: BILATERAL SALPINGO-OOPHORECTOMY, LAPAROSCOPIC , WASHINGS, EXCISION OF LEFT SIDE WALL CYST;  Surgeon: Cleotilde Ronal RAMAN, MD;  Location: [facility] OR;  Service: Gynecology;  Laterality: N/A;   LEFT HEART CATH AND CORONARY ANGIOGRAPHY N/A 06/04/2016   Procedure: Left Heart Cath and Coronary Angiography;  Surgeon: Claudene Victory ORN, MD;  Location: [facility] INVASIVE CV LAB;  Service: Cardiovascular;  Laterality: N/A;   LEFT HEART CATH AND CORONARY ANGIOGRAPHY N/A 12/04/2019   Procedure: LEFT HEART CATH AND CORONARY ANGIOGRAPHY;  Surgeon: Verlin Lonni BIRCH, MD;  Location: [facility] INVASIVE CV LAB;  Service: Cardiovascular;  Laterality: N/A;   LEFT HEART CATHETERIZATION WITH CORONARY ANGIOGRAM N/A 01/16/2011   Procedure: LEFT HEART CATHETERIZATION WITH CORONARY ANGIOGRAM;  Surgeon: Oneil Jeffrie, MD;  Location: [facility] CATH LAB;  Service: Cardiovascular;  Laterality: N/A;   NASAL SEPTUM SURGERY  2000   SINOSCOPY     Family History  Problem Relation Age of Onset   Hypertension Mother    Heart disease Mother    COPD Mother    Ulcers Mother    Lung cancer Father    Hypertension Sister    Diabetes Sister    Melanoma Sister    Arthritis Sister    Depression Sister    Miscarriages / Stillbirths Sister    Hyperlipidemia Brother    Hypertension Brother    Intellectual disability Brother    Kidney disease Brother    Heart attack Brother    Asthma Brother    Early death Brother 58   Hyperlipidemia Brother    Hypertension Brother    Intellectual  disability Brother    Kidney disease Brother    Heart disease Brother    Alcohol abuse Brother    Early death Maternal  Grandmother    Tuberculosis Maternal Grandmother    Lung cancer Maternal Grandfather    Suicidality Maternal Grandfather    Early death Maternal Grandfather    Heart failure Paternal Grandmother    Heart attack Paternal Grandmother    Hypertension Paternal Grandmother    Hyperlipidemia Paternal Grandmother    Heart disease Paternal Grandmother    Heart disease Paternal Grandfather    Hyperlipidemia Paternal Grandfather    Hypertension Paternal Grandfather    Heart attack Other    Stroke Neg Hx    Breast cancer Neg Hx    Allergies as of 12/19/2023       Reactions   Bupropion Nausea Only, Other (See Comments)   Nausea and headaches    Sulfa Antibiotics Other (See Comments)   From childhood- reaction not recalled   Prednisone  Anxiety, Other (See Comments)   Made me not feel like myself, altered        Medication List        Accurate as of December 19, 2023 11:33 AM. If you have any questions, ask your nurse or doctor.          amLODipine  5 MG tablet Commonly known as: NORVASC  Take 1 tablet (5 mg total) by mouth daily.   aspirin  EC 81 MG tablet Take 81 mg by mouth daily.   budesonide  0.5 MG/2ML nebulizer solution Commonly known as: Pulmicort  Use twice a day in a sinus rinse bottle.   carvedilol  6.25 MG tablet Commonly known as: COREG  Take 1 tablet (6.25 mg total) by mouth 2 (two) times daily with a meal.   cetirizine  10 MG tablet Commonly known as: ZyrTEC  Allergy Take 1 tablet (10 mg total) by mouth daily.   clindamycin 1 % external solution Commonly known as: CLEOCIN T SMARTSIG:15 Milliliter(s) Topical Twice Daily PRN   cromolyn  4 % ophthalmic solution Commonly known as: OPTICROM  Place 1 drop into both eyes 4 (four) times daily as needed (itchy/watery eyes).   dicyclomine  10 MG capsule Commonly known as: BENTYL  Take 1 capsule  (10 mg total) by mouth 4 (four) times daily -  before meals and at bedtime.   Difluprednate 0.05 % Emul INSTILL 1 DROP INTO LEFT EYE 4 TIMES DAILY FOR 1 WEEK   estradiol  0.1 MG/GM vaginal cream Commonly known as: ESTRACE  Apply a pea-sized amount to finger tip and rub in vaginal and urethral area 3x weekly   hydrOXYzine  10 MG tablet Commonly known as: ATARAX  Take 1 tablet (10 mg total) by mouth 3 (three) times daily as needed.   IBgard 90 MG Cpcr Generic drug: Peppermint Oil Use as directed  Exp: 10-2025   isosorbide  mononitrate 60 MG 24 hr tablet Commonly known as: IMDUR  TAKE 1 TABLET BY MOUTH EVERY DAY   lisinopril -hydrochlorothiazide  20-12.5 MG tablet Commonly known as: ZESTORETIC  Take 1 tablet by mouth daily.   meloxicam  15 MG tablet Commonly known as: MOBIC  Take 1 tablet (15 mg total) by mouth daily.   montelukast  10 MG tablet Commonly known as: SINGULAIR  TAKE 1 TABLET BY MOUTH EVERYDAY AT BEDTIME   nitroGLYCERIN  0.4 MG SL tablet Commonly known as: NITROSTAT  Place 1 tablet (0.4 mg total) under the tongue every 5 (five) minutes as needed for chest pain.   rosuvastatin  20 MG tablet Commonly known as: CRESTOR  Take 1 tablet (20 mg total) by mouth daily.   sertraline  100 MG tablet Commonly known as: ZOLOFT  Take 1 tablet (100 mg total) by mouth at bedtime.   tirzepatide  10 MG/0.5ML Pen Commonly  known as: MOUNJARO  Inject 10 mg into the skin once a week.   tiZANidine 4 MG tablet Commonly known as: Zanaflex Take 0.5-1 tablets (2-4 mg total) by mouth every 8 (eight) hours as needed for muscle spasms.   tobramycin-dexamethasone  ophthalmic solution Commonly known as: TOBRADEX INSTILL 1 DROP INTO BOTH EYES 4 TIMES A DAY FOR 7 DAYS. SHAKE WELL   triamcinolone  cream 0.1 % Commonly known as: KENALOG  SMARTSIG:sparingly Topical Twice Daily PRN   Vitamin D  (Ergocalciferol ) 1.25 MG (50000 UNIT) Caps capsule Commonly known as: DRISDOL  Take 1 capsule (50,000 Units  total) by mouth every 7 (seven) days.        All past medical history, surgical history, allergies, family history, immunizations andmedications were updated in the EMR today and reviewed under the history and medication portions of their EMR.     Review of Systems  Constitutional: Negative.   HENT: Negative.    Eyes: Negative.   Respiratory: Negative.    Cardiovascular: Negative.   Gastrointestinal: Negative.   Genitourinary: Negative.   Musculoskeletal: Negative.   Skin: Negative.   Neurological: Negative.   Endo/Heme/Allergies: Negative.   Psychiatric/Behavioral: Negative.    All other systems reviewed and are negative.  Negative, with the exception of above mentioned in HPI   Objective:  BP 120/60   Pulse 73   Temp 98 F (36.7 C)   Wt 196 lb 12.8 oz (89.3 kg)   SpO2 98%   BMI 33.78 kg/m  Body mass index is 33.78 kg/m. Physical Exam Vitals and nursing note reviewed.  Constitutional:      General: She is not in acute distress.    Appearance: Normal appearance. She is normal weight. She is not ill-appearing or toxic-appearing.  HENT:     Head: Normocephalic and atraumatic.  Eyes:     General: No scleral icterus.       Right eye: No discharge.        Left eye: No discharge.     Extraocular Movements: Extraocular movements intact.     Conjunctiva/sclera: Conjunctivae normal.     Pupils: Pupils are equal, round, and reactive to light.  Cardiovascular:     Rate and Rhythm: Normal rate.  Musculoskeletal:     Comments: Right upper extremity: Full range of motion with pain onset at 80 degrees abduction still present.  Positive empty can test.  Positive Hawkins.  Negative O'Brien's.  Neurovascular intact distally.  Skin:    Findings: No rash.  Neurological:     Mental Status: She is alert and oriented to person, place, and time. Mental status is at baseline.     Motor: No weakness.     Coordination: Coordination normal.     Gait: Gait normal.  Psychiatric:         Mood and Affect: Mood normal.        Behavior: Behavior normal.        Thought Content: Thought content normal.        Judgment: Judgment normal.      No results found. No results found. No results found for this or any previous visit (from the past 24 hours).  Assessment/Plan: Kristie Owens is a 68 y.o. female present for OV for  Rotator cuff injury She has seen some relief in the discomfort and her range of motion in all planes has improved, but still severely impacted by discomfort. We discussed options today and elected to refer to orthopedics for further evaluation She can continue Mobic  and the  Zanaflex as needed.   Reviewed expectations re: course of current medical issues. Discussed self-management of symptoms. Outlined signs and symptoms indicating need for more acute intervention. Patient verbalized understanding and all questions were answered. Patient received an After-Visit Summary.    Orders Placed This Encounter  Procedures   Ambulatory referral to Orthopedic Surgery   Meds ordered this encounter  Medications   meloxicam  (MOBIC ) 15 MG tablet    Sig: Take 1 tablet (15 mg total) by mouth daily.    Dispense:  90 tablet    Refill:  1   Referral Orders         Ambulatory referral to Orthopedic Surgery       Note is dictated utilizing voice recognition software. Although note has been proof read prior to signing, occasional typographical errors still can be missed. If any questions arise, please do not hesitate to call for verification.   electronically signed by:  Charlies Bellini, DO  [facility] - OR

## 2023-12-26 NOTE — Progress Notes (Signed)
 Agree with the assessment and plan as outlined by Alcide Evener, NP.    Zae Kirtz E. Tomasa Rand, MD [facility]

## 2023-12-31 ENCOUNTER — Other Ambulatory Visit: Payer: Self-pay | Admitting: Family Medicine

## 2023-12-31 ENCOUNTER — Telehealth: Payer: Self-pay

## 2023-12-31 NOTE — Telephone Encounter (Signed)
 Communication  Reason for CRM: Patient was told a referral to Orthopedic Surgery was going to be made. She was seen on 11/20 and has not heard from anybody about that referral    MyChart sent to pt with referral info.

## 2024-01-25 ENCOUNTER — Other Ambulatory Visit: Payer: Self-pay | Admitting: Family Medicine

## 2024-02-10 ENCOUNTER — Ambulatory Visit: Admitting: Family Medicine

## 2024-02-19 ENCOUNTER — Other Ambulatory Visit: Payer: Self-pay | Admitting: Family Medicine

## 2024-02-23 ENCOUNTER — Other Ambulatory Visit: Payer: Self-pay | Admitting: Family Medicine

## 2024-02-24 ENCOUNTER — Other Ambulatory Visit: Payer: Self-pay | Admitting: Cardiology

## 2024-02-24 ENCOUNTER — Ambulatory Visit: Admitting: Family Medicine

## 2024-02-27 ENCOUNTER — Other Ambulatory Visit: Payer: Self-pay | Admitting: Family Medicine

## 2024-03-02 ENCOUNTER — Ambulatory Visit: Admitting: Family Medicine

## 2024-03-02 ENCOUNTER — Telehealth: Payer: Self-pay

## 2024-03-04 ENCOUNTER — Encounter: Payer: Self-pay | Admitting: Family Medicine

## 2024-03-04 ENCOUNTER — Ambulatory Visit: Admitting: Family Medicine

## 2024-03-04 VITALS — BP 118/76 | HR 72 | Temp 98.3°F | Wt 203.2 lb

## 2024-03-04 DIAGNOSIS — F331 Major depressive disorder, recurrent, moderate: Secondary | ICD-10-CM

## 2024-03-04 DIAGNOSIS — I25119 Atherosclerotic heart disease of native coronary artery with unspecified angina pectoris: Secondary | ICD-10-CM

## 2024-03-04 DIAGNOSIS — R002 Palpitations: Secondary | ICD-10-CM

## 2024-03-04 DIAGNOSIS — M8580 Other specified disorders of bone density and structure, unspecified site: Secondary | ICD-10-CM

## 2024-03-04 DIAGNOSIS — E1169 Type 2 diabetes mellitus with other specified complication: Secondary | ICD-10-CM

## 2024-03-04 DIAGNOSIS — Z955 Presence of coronary angioplasty implant and graft: Secondary | ICD-10-CM

## 2024-03-04 DIAGNOSIS — I700 Atherosclerosis of aorta: Secondary | ICD-10-CM

## 2024-03-04 DIAGNOSIS — I10 Essential (primary) hypertension: Secondary | ICD-10-CM

## 2024-03-04 DIAGNOSIS — H1010 Acute atopic conjunctivitis, unspecified eye: Secondary | ICD-10-CM

## 2024-03-04 LAB — MICROALBUMIN / CREATININE URINE RATIO
Creatinine,U: 86.9 mg/dL
Microalb Creat Ratio: 30.7 mg/g — ABNORMAL HIGH (ref 0.0–30.0)
Microalb, Ur: 2.7 mg/dL — ABNORMAL HIGH (ref 0.7–1.9)

## 2024-03-04 LAB — HEMOGLOBIN A1C: Hgb A1c MFr Bld: 6.4 % (ref 4.6–6.5)

## 2024-03-04 MED ORDER — MONTELUKAST SODIUM 10 MG PO TABS: ORAL_TABLET | ORAL | 1 refills | Status: AC

## 2024-03-04 NOTE — Progress Notes (Unsigned)
 "     Patient ID: Kristie Owens, female  DOB: 28-Jun-1955, 69 y.o.   MRN: [id number] Patient Care Team    Relationship Specialty Notifications Start End  Catherine Charlies LABOR, DO PCP - General Family Medicine  06/09/19   Jeffrie Oneil BROCKS, MD PCP - Cardiology Cardiology  12/17/19   Jeffrie Oneil BROCKS, MD Consulting Physician Cardiology  06/08/19   Elspeth Lauraine DEL, OD Referring Physician   06/11/19   Jalene Francis DEL Physician Assistant Chiropractic Medicine  06/11/19   Elspeth Lauraine DEL, OD Referring Physician   03/13/21     Chief Complaint  Patient presents with   Hypertension    Subjective: Kristie Owens is a 69 y.o.  Female  present for chronic condition manage All past medical history, surgical history, allergies, family history, immunizations, medications and social history were updated in the electronic medical record today. All recent labs, ED visits and hospitalizations within the last year were reviewed. All  Hypertension/hyperlipidemia/coronary artery disease-status post coronary angioplasty implant and graft/TIA-palpitations/aortic atherosclerosis: Pt reports compliance with lisinopril -HCTZ 20-12.5, amlodipine  5 mg, Coreg  6.25 mg twice daily, Crestor  20 mg daily. Patient denies chest pain, shortness of breath, dizziness or lower extremity edema.    Patient is established with Dr. Jeffrie for cardiology.  Has a history of coronary artery disease status post heart catheterization in December 2012 and May 2018 demonstrating widely patent LAD and RCA stent, mild irregularity of the ostium of the diagonal where PTCA was felt to be high risk RF: Hypertension, hyperlipidemia, obesity, family history of heart disease   Echocardiogram 12/07/2019 IMPRESSIONS   1. Left ventricular ejection fraction, by estimation, is 65 to 70%. The  left ventricle has normal function. The left ventricle has no regional  wall motion abnormalities. There is mild left ventricular hypertrophy.  Left ventricular  diastolic parameters  are indeterminate.   2. Right ventricular systolic function is normal. The right ventricular  size is normal. Tricuspid regurgitation signal is inadequate for assessing  PA pressure.   3. The mitral valve is normal in structure. No evidence of mitral valve  regurgitation.   4. The aortic valve was not well visualized. Aortic valve regurgitation  is not visualized. Mild aortic valve sclerosis is present, with no  evidence of aortic valve stenosis   Cardiac catheterization on  12/04/2019 1. Patent proximal and mid LAD stents 2. Small caliber circumflex with mild to moderate non-obstructive disease 3. Large dominant RCA with patent mid stent, mild proximal stenosis. Moderate disease in the PDA and posterolateral artery, unchanged from last cath.  4. Normal LV systolic function, LVEF=65-70%. LVEDP 16   Recommendations: Continue medical management of CAD.  06/04/16: Widely patent proximal and distal LAD stents. 40% ostial narrowing in the first diagonal. Patent mid right coronary stents. Proximal 40% RCA eccentric narrowing. Circumflex is a small vessel with 40-50% first obtuse marginal. Ramus intermedius (or first obtuse marginal depending upon categorization) contains 40% proximal narrowing. Normal LV function. Elevated LVEDP at 23 mmHg. EF 55%.   Depression/anxiety/insomnia: Patient has a history of depression with anxiety, as well as insomnia.  She reports compliance with Zoloft  100 mg daily.  She feels this medication is working well for her Tried for insomnia: Belsomra , Ambien , Remeron , trazodone , vistaril . Nothing has worked well for her.    Allergic rhinitis: Patient reports compliance with Singulair  and OTC antihistamine.   Diabetes type 2 with hyperlipidemia: Patient has history of mildly elevated A1c in the diabetic range. She is watching her diet  and metformin  recently discontinued secondary to diarrhea of other causes.  Tolerating Mounjaro  10 mg  weekly Patient denies dizziness, hyperglycemic or hypoglycemic events. Patient denies numbness, tingling in the extremities or nonhealing wounds of feet.        03/04/2024   11:23 AM 10/14/2023    8:20 AM 07/01/2023    8:45 AM 04/11/2023    9:07 AM 03/18/2023    8:42 AM  Depression screen PHQ 2/9  Decreased Interest 0 0 0 0 0  Down, Depressed, Hopeless 0 0 0 0 0  PHQ - 2 Score 0 0 0 0 0  Altered sleeping 0 3 2  3   Tired, decreased energy 0 1 1  1   Change in appetite 0 0 1  0  Feeling bad or failure about yourself  0 0 0  0  Trouble concentrating 0 0 0  0  Moving slowly or fidgety/restless 0 0 0  0  Suicidal thoughts 0 0 0  0  PHQ-9 Score 0 4  4   4    Difficult doing work/chores Not difficult at all Somewhat difficult Not difficult at all  Somewhat difficult     Data saved with a previous flowsheet row definition      03/04/2024   11:23 AM 10/14/2023    8:20 AM 07/01/2023    8:45 AM 03/18/2023    8:43 AM  GAD 7 : Generalized Anxiety Score  Nervous, Anxious, on Edge 0 0  0  0   Control/stop worrying 0 0  0  0   Worry too much - different things 0 0  0  0   Trouble relaxing 0 0  2  0   Restless 0 0  0  0   Easily annoyed or irritable 0 0  0  0   Afraid - awful might happen 0 0  0  0   Total GAD 7 Score 0 0 2 0  Anxiety Difficulty Not difficult at all Not difficult at all Somewhat difficult Not difficult at all     Data saved with a previous flowsheet row definition      03/18/2023    8:42 AM 04/11/2023    9:08 AM 07/01/2023    8:45 AM 10/14/2023    8:20 AM 03/04/2024   11:22 AM  Fall Risk  Falls in the past year? 0 0 0 0 0  Was there an injury with Fall?  0   0  0  Fall Risk Category Calculator  0  1 0  Patient at Risk for Falls Due to     No Fall Risks  Fall risk Follow up Falls evaluation completed  Falls evaluation completed Falls evaluation completed Falls evaluation completed     Data saved with a previous flowsheet row definition    Immunization History  Administered  Date(s) Administered   Fluad Quad(high Dose 65+) 10/19/2019, 11/21/2020, 12/27/2021   Fluad Trivalent(High Dose 65+) 11/12/2022   Fluzone Influenza virus vaccine,trivalent (IIV3), split virus 12/28/2010, 11/22/2011, 11/13/2012   INFLUENZA, HIGH DOSE SEASONAL PF 11/21/2018, 10/14/2023   Influenza,inj,Quad PF,6+ Mos 12/15/2013, 12/13/2016, 11/21/2017   Influenza,inj,quad, With Preservative 09/19/2015   Influenza-Unspecified 11/21/2018   Moderna Covid-19 Vaccine Bivalent Booster 10yrs & up 03/27/2021   Moderna Sars-Covid-2 Vaccination 02/19/2019, 03/27/2019, 12/19/2019   Pneumococcal Conjugate-13 11/21/2017   Pneumococcal Polysaccharide-23 11/25/2018   Tdap 08/10/2013, 03/18/2023   Zoster Recombinant(Shingrix) 12/30/2019, 11/21/2020   Past Medical History:  Diagnosis Date   Achilles bursitis or tendinitis 11/11/2012  right    Allergy    Angina at rest    Dr Jeffrie   CAD (coronary artery disease), native coronary artery    DES to LAD in 2 areas, one at the bifurcation of a diagonal branch and also in the mid to distal region. Her diagonal branch was also ballooned . She has a DES to RCA 10/10. Her circumflex artery mild ostial disease.   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   Tremor 03/23/2020   Allergies  Allergen Reactions   Bupropion Nausea Only and Other (See  Comments)    Nausea and headaches    Sulfa Antibiotics Other (See Comments)    From childhood- reaction not recalled   Prednisone  Anxiety and Other (See Comments)    Made me not feel like myself, altered   Past Surgical History:  Procedure Laterality Date   BREAST EXCISIONAL BIOPSY Right 1995   and in 1996   CAROTID STENT  2010   3 done wtithin a few weeks   CESAREAN SECTION  1983   FOOT SURGERY     LAPAROSCOPIC SALPINGO OOPHERECTOMY N/A 10/08/2023   Procedure: BILATERAL SALPINGO-OOPHORECTOMY, LAPAROSCOPIC , WASHINGS, EXCISION OF LEFT SIDE WALL CYST;  Surgeon: Cleotilde Ronal RAMAN, MD;  Location: [facility] OR;  Service: Gynecology;  Laterality: N/A;   LEFT HEART CATH AND CORONARY ANGIOGRAPHY N/A 06/04/2016   Procedure: Left Heart Cath and Coronary Angiography;  Surgeon: Claudene Victory ORN, MD;  Location: [facility] INVASIVE CV LAB;  Service: Cardiovascular;  Laterality: N/A;   LEFT HEART CATH AND CORONARY ANGIOGRAPHY N/A 12/04/2019   Procedure: LEFT HEART CATH AND CORONARY ANGIOGRAPHY;  Surgeon: Verlin Lonni BIRCH, MD;  Location: [facility] INVASIVE CV LAB;  Service: Cardiovascular;  Laterality: N/A;   LEFT HEART CATHETERIZATION WITH CORONARY ANGIOGRAM N/A 01/16/2011   Procedure: LEFT HEART CATHETERIZATION WITH CORONARY ANGIOGRAM;  Surgeon: Oneil Jeffrie, MD;  Location: [facility] CATH LAB;  Service: Cardiovascular;  Laterality: N/A;   NASAL SEPTUM SURGERY  2000   SINOSCOPY     Family History  Problem Relation Age of Onset   Hypertension Mother    Heart disease Mother    COPD Mother    Ulcers Mother    Lung cancer Father    Hypertension Sister    Diabetes Sister    Melanoma Sister    Arthritis Sister    Depression Sister    Miscarriages / Stillbirths Sister    Hyperlipidemia Brother    Hypertension Brother    Intellectual disability Brother    Kidney disease Brother    Heart attack Brother    Asthma Brother    Early death Brother 49   Hyperlipidemia Brother    Hypertension Brother    Intellectual  disability Brother  Kidney disease Brother    Heart disease Brother    Alcohol abuse Brother    Early death Maternal Grandmother    Tuberculosis Maternal Grandmother    Lung cancer Maternal Grandfather    Suicidality Maternal Grandfather    Early death Maternal Grandfather    Heart failure Paternal Grandmother    Heart attack Paternal Grandmother    Hypertension Paternal Grandmother    Hyperlipidemia Paternal Grandmother    Heart disease Paternal Grandmother    Heart disease Paternal Grandfather    Hyperlipidemia Paternal Grandfather    Hypertension Paternal Grandfather    Heart attack Other    Stroke Neg Hx    Breast cancer Neg Hx    Social History   Social History Narrative   Marital status/children/pets: Married.  One child.   Education/employment: Retired.  Associates degree in college.   Safety:      -Wears a bicycle helmet riding a bike: Yes     -smoke alarm in the home:Yes     - wears seatbelt: Yes     - Feels safe in their relationships: Yes    Allergies as of 03/04/2024       Reactions   Bupropion Nausea Only, Other (See Comments)   Nausea and headaches    Sulfa Antibiotics Other (See Comments)   From childhood- reaction not recalled   Prednisone  Anxiety, Other (See Comments)   Made me not feel like myself, altered        Medication List        Accurate as of March 04, 2024 11:59 PM. If you have any questions, ask your nurse or doctor.          STOP taking these medications    budesonide  0.5 MG/2ML nebulizer solution Commonly known as: Pulmicort  Stopped by: Charlies Bellini, DO   clindamycin 1 % external solution Commonly known as: CLEOCIN T Stopped by: Charlies Bellini, DO   cromolyn  4 % ophthalmic solution Commonly known as: OPTICROM  Stopped by: Charlies Bellini, DO   hydrOXYzine  10 MG tablet Commonly known as: ATARAX  Stopped by: Charlies Bellini, DO   IBgard 90 MG Cpcr Generic drug: Peppermint Oil Stopped by: Charlies Bellini, DO   tirzepatide   10 MG/0.5ML Pen Commonly known as: MOUNJARO  Replaced by: tirzepatide  12.5 MG/0.5ML Pen Stopped by: Charlies Bellini, DO   tobramycin-dexamethasone  ophthalmic solution Commonly known as: TOBRADEX Stopped by: Charlies Bellini, DO   triamcinolone  cream 0.1 % Commonly known as: KENALOG  Stopped by: Charlies Bellini, DO       TAKE these medications    amLODipine  5 MG tablet Commonly known as: NORVASC  Take 1 tablet (5 mg total) by mouth daily.   aspirin  EC 81 MG tablet Take 81 mg by mouth daily.   carvedilol  6.25 MG tablet Commonly known as: COREG  Take 1 tablet (6.25 mg total) by mouth 2 (two) times daily with a meal.   cetirizine  10 MG tablet Commonly known as: ZyrTEC  Allergy Take 1 tablet (10 mg total) by mouth daily.   dicyclomine  10 MG capsule Commonly known as: BENTYL  TAKE 1 CAPSULE (10 MG TOTAL) BY MOUTH 4 TIMES A DAY BEFORE MEALS AND AT BEDTIME   Difluprednate 0.05 % Emul INSTILL 1 DROP INTO LEFT EYE 4 TIMES DAILY FOR 1 WEEK   estradiol  0.1 MG/GM vaginal cream Commonly known as: ESTRACE  Apply a pea-sized amount to finger tip and rub in vaginal and urethral area 3x weekly   isosorbide  mononitrate 60 MG 24 hr tablet Commonly known as: IMDUR  TAKE 1  TABLET BY MOUTH EVERY DAY   lisinopril -hydrochlorothiazide  20-12.5 MG tablet Commonly known as: ZESTORETIC  Take 1 tablet by mouth daily.   meloxicam  15 MG tablet Commonly known as: MOBIC  Take 1 tablet (15 mg total) by mouth daily.   montelukast  10 MG tablet Commonly known as: SINGULAIR  TAKE 1 TABLET BY MOUTH EVERYDAY AT BEDTIME   nitroGLYCERIN  0.4 MG SL tablet Commonly known as: NITROSTAT  Place 1 tablet (0.4 mg total) under the tongue every 5 (five) minutes as needed for chest pain.   rosuvastatin  20 MG tablet Commonly known as: CRESTOR  Take 1 tablet (20 mg total) by mouth daily.   sertraline  100 MG tablet Commonly known as: ZOLOFT  Take 1 tablet (100 mg total) by mouth at bedtime.   tirzepatide  12.5 MG/0.5ML  Pen Commonly known as: MOUNJARO  Inject 12.5 mg into the skin once a week. Replaces: tirzepatide  10 MG/0.5ML Pen Started by: Charlies Bellini, DO   tirzepatide  15 MG/0.5ML Pen Commonly known as: MOUNJARO  Inject 15 mg into the skin once a week. Start taking on: March 28, 2024 Started by: Charlies Bellini, DO   tiZANidine  4 MG tablet Commonly known as: ZANAFLEX  Take 0.5-1 tablets (2-4 mg total) by mouth every 8 (eight) hours as needed for muscle spasms.   Vitamin D  (Ergocalciferol ) 1.25 MG (50000 UNIT) Caps capsule Commonly known as: DRISDOL  Take 1 capsule (50,000 Units total) by mouth every 7 (seven) days.        All past medical history, surgical history, allergies, family history, immunizations andmedications were updated in the EMR today and reviewed under the history and medication portions of their EMR.       ROS 14 pt review of systems performed and negative (unless mentioned in an HPI)  Objective: BP 118/76   Pulse 72   Temp 98.3 F (36.8 C)   Wt 203 lb 3.2 oz (92.2 kg)   SpO2 98%   BMI 34.88 kg/m  Physical Exam Vitals and nursing note reviewed.  Constitutional:      General: She is not in acute distress.    Appearance: Normal appearance. She is not ill-appearing, toxic-appearing or diaphoretic.  HENT:     Head: Normocephalic and atraumatic.  Eyes:     General: No scleral icterus.       Right eye: No discharge.        Left eye: No discharge.     Extraocular Movements: Extraocular movements intact.     Conjunctiva/sclera: Conjunctivae normal.     Pupils: Pupils are equal, round, and reactive to light.  Cardiovascular:     Rate and Rhythm: Normal rate and regular rhythm.  Pulmonary:     Effort: Pulmonary effort is normal. No respiratory distress.     Breath sounds: Normal breath sounds. No wheezing, rhonchi or rales.  Musculoskeletal:     Right lower leg: No edema.     Left lower leg: No edema.  Skin:    General: Skin is warm.     Findings: No rash.   Neurological:     Mental Status: She is alert and oriented to person, place, and time. Mental status is at baseline.     Motor: No weakness.     Gait: Gait normal.  Psychiatric:        Mood and Affect: Mood normal.        Behavior: Behavior normal.        Thought Content: Thought content normal.        Judgment: Judgment normal.     Diabetic  Foot Exam - Simple   Simple Foot Form  03/04/2024  1:44 PM  Visual Inspection No deformities, no ulcerations, no other skin breakdown bilaterally: Yes Sensation Testing Intact to touch and monofilament testing bilaterally: Yes Pulse Check Posterior Tibialis and Dorsalis pulse intact bilaterally: Yes Comments     No results found.  Assessment/plan: Kristie Owens is a 69 y.o. female present for chronic condition manage Osteoarthritis Stable Continue Mobic  qd with food.  - continue  OTC voltaren gel/cream.  - discussed cherry tart extract and tumeric natural supplements and their SE potentials  - ANA, RF, CCP, ESR, CRP > WNL   Major depressive disorder, moderate /insomnia Stable Continue Zoloft  100 mg daily. Discontinued Ambien  2021.   DCd trazodone  taper 25-75 mg nightly.> not helpful Dc vistaril > not helpful and felt bad on higher dose.  DC'd belsomra  > discontinued> expensive,  low-dose worked okay but not well.  Diabetes type 2 with hyperlipidemia: Stable Patient encouraged to continue exercise and dietary modifications.  Discontinued metformin  for now secondary to  diarrhea of unknown cause Increase mounjaro  10 mg> 12.5 mg for 4 weeks, then increase again to 15 milligram weekly injection a1c: 5.7 (was 6.7)> 5.8> 6.6 > 5.9>5.5> 6.4 >5.5>6.0 (10/08/2023)>collected today Eye exam up-to-date 2025> records requested Foot exam completed 03/04/2024 - Microalbumin / creatinine urine ratio-03/2023>collected today  Essential hypertension/hyperlipidemia/Presence of coronary angioplasty implant and graft/Atherosclerosis of native coronary  artery of native heart with other form of angina pectoris (HCC)/Intermediate coronary syndrome (HCC)/ Palpitations/Transient neurologic deficit/Morbid obesity (HCC) Stable Continue daily exercise. Continue low-sodium/heart healthy diet. Continue amlodipine  5 mg daily Continue Coreg  6.25 mg twice daily Continue lisinopril -HCTZ 20-12.5 mg daily Continue Crestor  20 mg daily Continue baby aspirin  daily  Allergic rhinitis, unspecified seasonality, unspecified trigger Continue antihistamine of choice.  OTC  Continue Singulair  Est with A&A  Osteopenia, unspecified location/Vitamin D  deficiency - Vitamin D  (25 hydroxy) UTD/WNL -UTD 07/2022 T-score -1.8 repeat 2 years  Return in about 24 weeks (around 08/19/2024) for cpe (20 min), Routine chronic condition follow-up.  Orders Placed This Encounter  Procedures   Urine Microalbumin w/creat. ratio   Hemoglobin A1c   Meds ordered this encounter  Medications   montelukast  (SINGULAIR ) 10 MG tablet    Sig: TAKE 1 TABLET BY MOUTH EVERYDAY AT BEDTIME    Dispense:  90 tablet    Refill:  1   tirzepatide  (MOUNJARO ) 12.5 MG/0.5ML Pen    Sig: Inject 12.5 mg into the skin once a week.    Dispense:  2 mL    Refill:  0   tirzepatide  (MOUNJARO ) 15 MG/0.5ML Pen    Sig: Inject 15 mg into the skin once a week.    Dispense:  2 mL    Refill:  5    Do not fill 15 mg prescription, until the 12.5 mg dose has been filled.  Thanks   sertraline  (ZOLOFT ) 100 MG tablet    Sig: Take 1 tablet (100 mg total) by mouth at bedtime.    Dispense:  90 tablet    Refill:  1   rosuvastatin  (CRESTOR ) 20 MG tablet    Sig: Take 1 tablet (20 mg total) by mouth daily.    Dispense:  90 tablet    Refill:  3   lisinopril -hydrochlorothiazide  (ZESTORETIC ) 20-12.5 MG tablet    Sig: Take 1 tablet by mouth daily.    Dispense:  90 tablet    Refill:  1   carvedilol  (COREG ) 6.25 MG tablet    Sig: Take 1  tablet (6.25 mg total) by mouth 2 (two) times daily with a meal.    Dispense:   180 tablet    Refill:  1   amLODipine  (NORVASC ) 5 MG tablet    Sig: Take 1 tablet (5 mg total) by mouth daily.    Dispense:  90 tablet    Refill:  1   meloxicam  (MOBIC ) 15 MG tablet    Sig: Take 1 tablet (15 mg total) by mouth daily.    Dispense:  90 tablet    Refill:  1   tiZANidine  (ZANAFLEX ) 4 MG tablet    Sig: Take 0.5-1 tablets (2-4 mg total) by mouth every 8 (eight) hours as needed for muscle spasms.    Dispense:  90 tablet    Refill:  0   Referral Orders  No referral(s) requested today      Electronically signed by: Charlies Bellini, DO [facility] "

## 2024-03-05 ENCOUNTER — Encounter: Payer: Self-pay | Admitting: Family Medicine

## 2024-03-05 ENCOUNTER — Ambulatory Visit: Payer: Self-pay | Admitting: Family Medicine

## 2024-03-05 MED ORDER — LISINOPRIL-HYDROCHLOROTHIAZIDE 20-12.5 MG PO TABS: 1.0000 | ORAL_TABLET | Freq: Every day | ORAL | 1 refills | Status: AC

## 2024-03-05 MED ORDER — AMLODIPINE BESYLATE 5 MG PO TABS: 5.0000 mg | ORAL_TABLET | Freq: Every day | ORAL | 1 refills | Status: AC

## 2024-03-05 MED ORDER — TIZANIDINE HCL 4 MG PO TABS: 2.0000 mg | ORAL_TABLET | Freq: Three times a day (TID) | ORAL | 0 refills | Status: AC | PRN

## 2024-03-05 MED ORDER — SERTRALINE HCL 100 MG PO TABS: 100.0000 mg | ORAL_TABLET | Freq: Every evening | ORAL | 1 refills | Status: AC

## 2024-03-05 MED ORDER — CARVEDILOL 6.25 MG PO TABS: 6.2500 mg | ORAL_TABLET | Freq: Two times a day (BID) | ORAL | 1 refills | Status: AC

## 2024-03-05 MED ORDER — TIRZEPATIDE 12.5 MG/0.5ML ~~LOC~~ SOAJ: 12.5000 mg | SUBCUTANEOUS | 0 refills | Status: AC

## 2024-03-05 MED ORDER — MELOXICAM 15 MG PO TABS: 15.0000 mg | ORAL_TABLET | Freq: Every day | ORAL | 1 refills | Status: AC

## 2024-03-05 MED ORDER — ROSUVASTATIN CALCIUM 20 MG PO TABS: 20.0000 mg | ORAL_TABLET | Freq: Every day | ORAL | 3 refills | Status: AC

## 2024-03-05 MED ORDER — TIRZEPATIDE 15 MG/0.5ML ~~LOC~~ SOAJ: 15.0000 mg | SUBCUTANEOUS | 5 refills | Status: AC

## 2024-03-05 NOTE — Patient Instructions (Signed)

## 2024-04-13 ENCOUNTER — Ambulatory Visit (HOSPITAL_BASED_OUTPATIENT_CLINIC_OR_DEPARTMENT_OTHER): Admitting: Obstetrics & Gynecology

## 2024-09-01 ENCOUNTER — Ambulatory Visit: Admitting: Family Medicine
